# Patient Record
Sex: Female | Born: 1940 | Race: Black or African American | Hispanic: No | Smoking: Former smoker
Health system: Southern US, Community
[De-identification: ages and names within clinical notes are randomized; demographics above are authoritative.]

## PROBLEM LIST (undated history)

## (undated) DIAGNOSIS — K219 Gastro-esophageal reflux disease without esophagitis: Secondary | ICD-10-CM

## (undated) DIAGNOSIS — M1990 Unspecified osteoarthritis, unspecified site: Secondary | ICD-10-CM

## (undated) DIAGNOSIS — I639 Cerebral infarction, unspecified: Secondary | ICD-10-CM

## (undated) DIAGNOSIS — G4700 Insomnia, unspecified: Secondary | ICD-10-CM

## (undated) DIAGNOSIS — D126 Benign neoplasm of colon, unspecified: Secondary | ICD-10-CM

## (undated) DIAGNOSIS — R42 Dizziness and giddiness: Secondary | ICD-10-CM

## (undated) DIAGNOSIS — C189 Malignant neoplasm of colon, unspecified: Secondary | ICD-10-CM

## (undated) DIAGNOSIS — K3184 Gastroparesis: Secondary | ICD-10-CM

## (undated) DIAGNOSIS — R7303 Prediabetes: Secondary | ICD-10-CM

## (undated) DIAGNOSIS — E785 Hyperlipidemia, unspecified: Secondary | ICD-10-CM

## (undated) DIAGNOSIS — I10 Essential (primary) hypertension: Secondary | ICD-10-CM

## (undated) DIAGNOSIS — I2510 Atherosclerotic heart disease of native coronary artery without angina pectoris: Secondary | ICD-10-CM

## (undated) DIAGNOSIS — N393 Stress incontinence (female) (male): Secondary | ICD-10-CM

## (undated) HISTORY — DX: Prediabetes: R73.03

## (undated) HISTORY — DX: Benign neoplasm of colon, unspecified: D12.6

## (undated) HISTORY — PX: ABDOMINAL HYSTERECTOMY: SHX81

## (undated) HISTORY — PX: HEEL SPUR SURGERY: SHX665

## (undated) HISTORY — DX: Gastroparesis: K31.84

## (undated) HISTORY — DX: Cerebral infarction, unspecified: I63.9

## (undated) HISTORY — DX: Malignant neoplasm of colon, unspecified: C18.9

## (undated) HISTORY — DX: Hyperlipidemia, unspecified: E78.5

## (undated) HISTORY — DX: Atherosclerotic heart disease of native coronary artery without angina pectoris: I25.10

## (undated) HISTORY — DX: Unspecified osteoarthritis, unspecified site: M19.90

## (undated) HISTORY — DX: Insomnia, unspecified: G47.00

## (undated) HISTORY — DX: Dizziness and giddiness: R42

---
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
## 1993-04-25 HISTORY — PX: PARTIAL COLECTOMY: SHX5273

## 2000-08-07 ENCOUNTER — Encounter (HOSPITAL_COMMUNITY): Admission: RE | Admit: 2000-08-07 | Discharge: 2000-09-06 | Payer: Self-pay | Admitting: Oncology

## 2000-08-07 ENCOUNTER — Encounter: Admission: RE | Admit: 2000-08-07 | Discharge: 2000-08-07 | Payer: Self-pay | Admitting: Oncology

## 2000-10-16 ENCOUNTER — Encounter: Admission: RE | Admit: 2000-10-16 | Discharge: 2000-10-16 | Payer: Self-pay | Admitting: Oncology

## 2000-10-16 ENCOUNTER — Encounter (HOSPITAL_COMMUNITY): Admission: RE | Admit: 2000-10-16 | Discharge: 2000-11-15 | Payer: Self-pay | Admitting: Oncology

## 2000-10-27 ENCOUNTER — Encounter: Payer: Self-pay | Admitting: Podiatry

## 2000-10-27 ENCOUNTER — Ambulatory Visit (HOSPITAL_COMMUNITY): Admission: RE | Admit: 2000-10-27 | Discharge: 2000-10-27 | Payer: Self-pay | Admitting: *Deleted

## 2001-01-08 ENCOUNTER — Encounter: Payer: Self-pay | Admitting: Family Medicine

## 2001-01-08 ENCOUNTER — Ambulatory Visit (HOSPITAL_COMMUNITY): Admission: RE | Admit: 2001-01-08 | Discharge: 2001-01-08 | Payer: Self-pay | Admitting: Family Medicine

## 2001-02-28 ENCOUNTER — Encounter: Admission: RE | Admit: 2001-02-28 | Discharge: 2001-02-28 | Payer: Self-pay | Admitting: Oncology

## 2001-02-28 ENCOUNTER — Encounter (HOSPITAL_COMMUNITY): Admission: RE | Admit: 2001-02-28 | Discharge: 2001-03-30 | Payer: Self-pay | Admitting: Oncology

## 2001-04-16 ENCOUNTER — Emergency Department (HOSPITAL_COMMUNITY): Admission: EM | Admit: 2001-04-16 | Discharge: 2001-04-16 | Payer: Self-pay | Admitting: *Deleted

## 2001-04-16 ENCOUNTER — Encounter: Payer: Self-pay | Admitting: *Deleted

## 2001-04-25 HISTORY — PX: MENISCUS REPAIR: SHX5179

## 2001-10-16 ENCOUNTER — Ambulatory Visit (HOSPITAL_COMMUNITY): Admission: RE | Admit: 2001-10-16 | Discharge: 2001-10-16 | Payer: Self-pay | Admitting: Orthopaedic Surgery

## 2001-10-16 ENCOUNTER — Encounter: Payer: Self-pay | Admitting: Orthopaedic Surgery

## 2001-10-23 ENCOUNTER — Ambulatory Visit (HOSPITAL_COMMUNITY): Admission: RE | Admit: 2001-10-23 | Discharge: 2001-10-23 | Payer: Self-pay | Admitting: Orthopaedic Surgery

## 2001-12-12 ENCOUNTER — Ambulatory Visit (HOSPITAL_COMMUNITY): Admission: RE | Admit: 2001-12-12 | Discharge: 2001-12-12 | Payer: Self-pay | Admitting: General Surgery

## 2002-01-11 ENCOUNTER — Ambulatory Visit (HOSPITAL_COMMUNITY): Admission: RE | Admit: 2002-01-11 | Discharge: 2002-01-11 | Payer: Self-pay | Admitting: Family Medicine

## 2002-01-11 ENCOUNTER — Encounter: Payer: Self-pay | Admitting: Family Medicine

## 2002-04-25 HISTORY — PX: CERVICAL SPINE SURGERY: SHX589

## 2002-09-27 ENCOUNTER — Encounter: Payer: Self-pay | Admitting: Emergency Medicine

## 2002-09-27 ENCOUNTER — Emergency Department (HOSPITAL_COMMUNITY): Admission: EM | Admit: 2002-09-27 | Discharge: 2002-09-27 | Payer: Self-pay | Admitting: Emergency Medicine

## 2003-02-17 ENCOUNTER — Encounter: Payer: Self-pay | Admitting: Family Medicine

## 2003-02-17 ENCOUNTER — Ambulatory Visit (HOSPITAL_COMMUNITY): Admission: RE | Admit: 2003-02-17 | Discharge: 2003-02-17 | Payer: Self-pay | Admitting: Family Medicine

## 2003-10-07 ENCOUNTER — Ambulatory Visit (HOSPITAL_COMMUNITY): Admission: RE | Admit: 2003-10-07 | Discharge: 2003-10-07 | Payer: Self-pay | Admitting: *Deleted

## 2003-10-20 ENCOUNTER — Ambulatory Visit (HOSPITAL_COMMUNITY): Admission: RE | Admit: 2003-10-20 | Discharge: 2003-10-21 | Payer: Self-pay | Admitting: *Deleted

## 2003-10-20 ENCOUNTER — Encounter (INDEPENDENT_AMBULATORY_CARE_PROVIDER_SITE_OTHER): Payer: Self-pay | Admitting: Internal Medicine

## 2004-02-19 ENCOUNTER — Inpatient Hospital Stay (HOSPITAL_COMMUNITY): Admission: AD | Admit: 2004-02-19 | Discharge: 2004-02-22 | Payer: Self-pay | Admitting: Family Medicine

## 2004-03-01 ENCOUNTER — Ambulatory Visit (HOSPITAL_COMMUNITY): Admission: RE | Admit: 2004-03-01 | Discharge: 2004-03-01 | Payer: Self-pay | Admitting: General Surgery

## 2004-04-13 ENCOUNTER — Ambulatory Visit (HOSPITAL_COMMUNITY): Admission: RE | Admit: 2004-04-13 | Discharge: 2004-04-13 | Payer: Self-pay | Admitting: General Surgery

## 2004-04-15 ENCOUNTER — Ambulatory Visit (HOSPITAL_COMMUNITY): Admission: RE | Admit: 2004-04-15 | Discharge: 2004-04-15 | Payer: Self-pay | Admitting: General Surgery

## 2005-09-29 ENCOUNTER — Ambulatory Visit (HOSPITAL_COMMUNITY): Admission: RE | Admit: 2005-09-29 | Discharge: 2005-09-29 | Payer: Self-pay | Admitting: Family Medicine

## 2005-12-19 ENCOUNTER — Encounter (INDEPENDENT_AMBULATORY_CARE_PROVIDER_SITE_OTHER): Payer: Self-pay | Admitting: Internal Medicine

## 2006-03-01 ENCOUNTER — Encounter (INDEPENDENT_AMBULATORY_CARE_PROVIDER_SITE_OTHER): Payer: Self-pay | Admitting: Internal Medicine

## 2006-03-03 ENCOUNTER — Ambulatory Visit: Payer: Self-pay | Admitting: Internal Medicine

## 2006-03-14 ENCOUNTER — Emergency Department (HOSPITAL_COMMUNITY): Admission: EM | Admit: 2006-03-14 | Discharge: 2006-03-14 | Payer: Self-pay | Admitting: Emergency Medicine

## 2006-03-17 ENCOUNTER — Emergency Department (HOSPITAL_COMMUNITY): Admission: EM | Admit: 2006-03-17 | Discharge: 2006-03-17 | Payer: Self-pay | Admitting: Emergency Medicine

## 2006-03-17 ENCOUNTER — Encounter (INDEPENDENT_AMBULATORY_CARE_PROVIDER_SITE_OTHER): Payer: Self-pay | Admitting: Internal Medicine

## 2006-03-23 ENCOUNTER — Ambulatory Visit: Payer: Self-pay | Admitting: Internal Medicine

## 2006-03-31 ENCOUNTER — Ambulatory Visit: Payer: Self-pay | Admitting: Internal Medicine

## 2006-03-31 LAB — CONVERTED CEMR LAB: HDL: 62 mg/dL

## 2006-04-12 ENCOUNTER — Ambulatory Visit: Payer: Self-pay | Admitting: Internal Medicine

## 2006-04-13 ENCOUNTER — Ambulatory Visit (HOSPITAL_COMMUNITY): Admission: RE | Admit: 2006-04-13 | Discharge: 2006-04-14 | Payer: Self-pay | Admitting: Neurosurgery

## 2006-04-28 ENCOUNTER — Ambulatory Visit: Payer: Self-pay | Admitting: Internal Medicine

## 2006-05-19 ENCOUNTER — Ambulatory Visit: Payer: Self-pay | Admitting: Internal Medicine

## 2006-05-20 ENCOUNTER — Encounter (INDEPENDENT_AMBULATORY_CARE_PROVIDER_SITE_OTHER): Payer: Self-pay | Admitting: Internal Medicine

## 2006-05-20 LAB — CONVERTED CEMR LAB
AST: 15 units/L (ref 0–37)
Albumin: 4 g/dL (ref 3.5–5.2)
BUN: 10 mg/dL (ref 6–23)
Calcium: 9.3 mg/dL (ref 8.4–10.5)
Chloride: 103 meq/L (ref 96–112)
Potassium: 4 meq/L (ref 3.5–5.3)
Total Protein: 8.3 g/dL (ref 6.0–8.3)

## 2006-05-22 ENCOUNTER — Encounter (INDEPENDENT_AMBULATORY_CARE_PROVIDER_SITE_OTHER): Payer: Self-pay | Admitting: Internal Medicine

## 2006-07-18 ENCOUNTER — Encounter (INDEPENDENT_AMBULATORY_CARE_PROVIDER_SITE_OTHER): Payer: Self-pay | Admitting: Internal Medicine

## 2006-07-19 ENCOUNTER — Ambulatory Visit: Payer: Self-pay | Admitting: Internal Medicine

## 2006-08-15 ENCOUNTER — Encounter: Payer: Self-pay | Admitting: Internal Medicine

## 2006-08-16 ENCOUNTER — Ambulatory Visit: Payer: Self-pay | Admitting: Internal Medicine

## 2006-08-17 ENCOUNTER — Encounter (INDEPENDENT_AMBULATORY_CARE_PROVIDER_SITE_OTHER): Payer: Self-pay | Admitting: Internal Medicine

## 2006-10-20 ENCOUNTER — Ambulatory Visit: Payer: Self-pay | Admitting: Internal Medicine

## 2006-10-23 ENCOUNTER — Telehealth (INDEPENDENT_AMBULATORY_CARE_PROVIDER_SITE_OTHER): Payer: Self-pay | Admitting: *Deleted

## 2006-10-23 LAB — CONVERTED CEMR LAB
ALT: 8 U/L
AST: 12 U/L
Albumin: 4 g/dL
Alkaline Phosphatase: 42 U/L
BUN: 15 mg/dL
CO2: 25 meq/L
Calcium: 9.2 mg/dL
Chloride: 104 meq/L
Cholesterol: 171 mg/dL
Creatinine, Ser: 0.99 mg/dL
Glucose, Bld: 109 mg/dL — ABNORMAL HIGH
HDL: 38 mg/dL — ABNORMAL LOW
LDL Cholesterol: 112 mg/dL — ABNORMAL HIGH
Potassium: 3.4 meq/L — ABNORMAL LOW
Sodium: 142 meq/L
Total Bilirubin: 0.5 mg/dL
Total CHOL/HDL Ratio: 4.5
Total Protein: 8.2 g/dL
Triglycerides: 104 mg/dL
VLDL: 21 mg/dL

## 2006-10-26 ENCOUNTER — Telehealth (INDEPENDENT_AMBULATORY_CARE_PROVIDER_SITE_OTHER): Payer: Self-pay | Admitting: Internal Medicine

## 2006-11-02 ENCOUNTER — Ambulatory Visit (HOSPITAL_COMMUNITY): Admission: RE | Admit: 2006-11-02 | Discharge: 2006-11-02 | Payer: Self-pay | Admitting: Internal Medicine

## 2006-11-03 ENCOUNTER — Telehealth (INDEPENDENT_AMBULATORY_CARE_PROVIDER_SITE_OTHER): Payer: Self-pay | Admitting: *Deleted

## 2006-11-15 ENCOUNTER — Telehealth (INDEPENDENT_AMBULATORY_CARE_PROVIDER_SITE_OTHER): Payer: Self-pay | Admitting: *Deleted

## 2006-11-16 ENCOUNTER — Telehealth (INDEPENDENT_AMBULATORY_CARE_PROVIDER_SITE_OTHER): Payer: Self-pay | Admitting: *Deleted

## 2007-01-10 ENCOUNTER — Ambulatory Visit: Payer: Self-pay | Admitting: Internal Medicine

## 2007-01-23 ENCOUNTER — Ambulatory Visit: Payer: Self-pay | Admitting: Internal Medicine

## 2007-02-26 ENCOUNTER — Ambulatory Visit: Payer: Self-pay | Admitting: Internal Medicine

## 2007-02-26 LAB — CONVERTED CEMR LAB
Anti Nuclear Antibody(ANA): NEGATIVE
Blood in Urine, dipstick: NEGATIVE
CO2: 25 meq/L (ref 19–32)
Glucose, Bld: 109 mg/dL — ABNORMAL HIGH (ref 70–99)
Ketones, urine, test strip: NEGATIVE
Nitrite: NEGATIVE
Potassium: 4.1 meq/L (ref 3.5–5.3)
Sed Rate: 57 mm/hr — ABNORMAL HIGH (ref 0–22)
Sodium: 137 meq/L (ref 135–145)
TSH: 1.698 microintl units/mL (ref 0.350–5.50)
Urobilinogen, UA: 0.2

## 2007-04-26 DIAGNOSIS — K3184 Gastroparesis: Secondary | ICD-10-CM

## 2007-04-26 HISTORY — DX: Gastroparesis: K31.84

## 2007-05-29 ENCOUNTER — Ambulatory Visit: Payer: Self-pay | Admitting: Internal Medicine

## 2007-06-08 ENCOUNTER — Ambulatory Visit: Payer: Self-pay | Admitting: Internal Medicine

## 2007-06-08 ENCOUNTER — Telehealth (INDEPENDENT_AMBULATORY_CARE_PROVIDER_SITE_OTHER): Payer: Self-pay | Admitting: *Deleted

## 2007-06-27 ENCOUNTER — Ambulatory Visit: Payer: Self-pay | Admitting: Internal Medicine

## 2007-06-28 ENCOUNTER — Ambulatory Visit (HOSPITAL_COMMUNITY): Admission: RE | Admit: 2007-06-28 | Discharge: 2007-06-28 | Payer: Self-pay | Admitting: Internal Medicine

## 2007-07-02 ENCOUNTER — Ambulatory Visit: Payer: Self-pay | Admitting: Internal Medicine

## 2007-07-02 ENCOUNTER — Ambulatory Visit (HOSPITAL_COMMUNITY): Admission: RE | Admit: 2007-07-02 | Discharge: 2007-07-02 | Payer: Self-pay | Admitting: Internal Medicine

## 2007-07-02 HISTORY — PX: COLONOSCOPY: SHX174

## 2007-07-20 ENCOUNTER — Encounter (INDEPENDENT_AMBULATORY_CARE_PROVIDER_SITE_OTHER): Payer: Self-pay | Admitting: Internal Medicine

## 2007-08-01 ENCOUNTER — Ambulatory Visit: Payer: Self-pay | Admitting: Gastroenterology

## 2007-08-01 ENCOUNTER — Encounter: Payer: Self-pay | Admitting: Gastroenterology

## 2007-08-03 ENCOUNTER — Ambulatory Visit: Payer: Self-pay | Admitting: Internal Medicine

## 2007-08-22 ENCOUNTER — Ambulatory Visit (HOSPITAL_COMMUNITY): Admission: RE | Admit: 2007-08-22 | Discharge: 2007-08-22 | Payer: Self-pay | Admitting: Internal Medicine

## 2007-08-22 ENCOUNTER — Ambulatory Visit: Payer: Self-pay | Admitting: Internal Medicine

## 2007-08-22 HISTORY — PX: ESOPHAGOGASTRODUODENOSCOPY: SHX1529

## 2007-08-27 ENCOUNTER — Ambulatory Visit: Payer: Self-pay | Admitting: Internal Medicine

## 2007-08-28 ENCOUNTER — Telehealth (INDEPENDENT_AMBULATORY_CARE_PROVIDER_SITE_OTHER): Payer: Self-pay | Admitting: *Deleted

## 2007-08-28 LAB — CONVERTED CEMR LAB
ALT: 13 units/L (ref 0–35)
Albumin: 3.8 g/dL (ref 3.5–5.2)
Alkaline Phosphatase: 48 units/L (ref 39–117)
CO2: 25 meq/L (ref 19–32)
Glucose, Bld: 119 mg/dL — ABNORMAL HIGH (ref 70–99)
LDL Cholesterol: 66 mg/dL (ref 0–99)
Potassium: 3.8 meq/L (ref 3.5–5.3)
Sodium: 142 meq/L (ref 135–145)
Total Protein: 7.8 g/dL (ref 6.0–8.3)
Triglycerides: 70 mg/dL (ref <150)
VLDL: 14 mg/dL (ref 0–40)

## 2007-09-04 ENCOUNTER — Telehealth (INDEPENDENT_AMBULATORY_CARE_PROVIDER_SITE_OTHER): Payer: Self-pay

## 2007-09-13 ENCOUNTER — Encounter: Payer: Self-pay | Admitting: Gastroenterology

## 2007-09-13 ENCOUNTER — Ambulatory Visit (HOSPITAL_COMMUNITY): Admission: RE | Admit: 2007-09-13 | Discharge: 2007-09-13 | Payer: Self-pay | Admitting: Gastroenterology

## 2007-09-18 ENCOUNTER — Telehealth (INDEPENDENT_AMBULATORY_CARE_PROVIDER_SITE_OTHER): Payer: Self-pay | Admitting: *Deleted

## 2007-09-18 ENCOUNTER — Ambulatory Visit: Payer: Self-pay | Admitting: Gastroenterology

## 2007-09-19 ENCOUNTER — Encounter (INDEPENDENT_AMBULATORY_CARE_PROVIDER_SITE_OTHER): Payer: Self-pay | Admitting: Internal Medicine

## 2007-09-19 ENCOUNTER — Ambulatory Visit: Payer: Self-pay | Admitting: Internal Medicine

## 2007-09-24 ENCOUNTER — Encounter (HOSPITAL_COMMUNITY): Admission: RE | Admit: 2007-09-24 | Discharge: 2007-10-24 | Payer: Self-pay | Admitting: Internal Medicine

## 2007-10-10 ENCOUNTER — Ambulatory Visit: Payer: Self-pay | Admitting: Internal Medicine

## 2007-10-18 ENCOUNTER — Ambulatory Visit: Payer: Self-pay | Admitting: Internal Medicine

## 2007-11-14 ENCOUNTER — Encounter (INDEPENDENT_AMBULATORY_CARE_PROVIDER_SITE_OTHER): Payer: Self-pay | Admitting: Internal Medicine

## 2007-11-14 ENCOUNTER — Ambulatory Visit: Payer: Self-pay | Admitting: Internal Medicine

## 2007-11-21 ENCOUNTER — Ambulatory Visit: Payer: Self-pay | Admitting: Internal Medicine

## 2007-11-29 ENCOUNTER — Ambulatory Visit (HOSPITAL_COMMUNITY): Admission: RE | Admit: 2007-11-29 | Discharge: 2007-11-29 | Payer: Self-pay | Admitting: Internal Medicine

## 2008-01-03 ENCOUNTER — Ambulatory Visit: Payer: Self-pay | Admitting: Internal Medicine

## 2008-01-15 ENCOUNTER — Encounter (INDEPENDENT_AMBULATORY_CARE_PROVIDER_SITE_OTHER): Payer: Self-pay | Admitting: Internal Medicine

## 2008-01-22 ENCOUNTER — Ambulatory Visit: Payer: Self-pay | Admitting: Family Medicine

## 2008-03-03 ENCOUNTER — Ambulatory Visit: Payer: Self-pay | Admitting: Internal Medicine

## 2008-03-04 LAB — CONVERTED CEMR LAB
ALT: 10 units/L (ref 0–35)
BUN: 11 mg/dL (ref 6–23)
Basophils Absolute: 0 10*3/uL (ref 0.0–0.1)
CO2: 24 meq/L (ref 19–32)
Calcium: 9 mg/dL (ref 8.4–10.5)
Chloride: 104 meq/L (ref 96–112)
Cholesterol: 130 mg/dL (ref 0–200)
Creatinine, Ser: 0.95 mg/dL (ref 0.40–1.20)
Eosinophils Relative: 5 % (ref 0–5)
Glucose, Bld: 113 mg/dL — ABNORMAL HIGH (ref 70–99)
HCT: 28.6 % — ABNORMAL LOW (ref 36.0–46.0)
HDL: 44 mg/dL (ref >39)
Hemoglobin: 8.8 g/dL — ABNORMAL LOW (ref 12.0–15.0)
Lymphocytes Relative: 43 % (ref 12–46)
Lymphs Abs: 2.4 10*3/uL (ref 0.7–4.0)
Monocytes Absolute: 0.5 10*3/uL (ref 0.1–1.0)
Monocytes Relative: 9 % (ref 3–12)
Neutro Abs: 2.4 10*3/uL (ref 1.7–7.7)
RBC: 3.09 M/uL — ABNORMAL LOW (ref 3.87–5.11)
RDW: 15.3 % (ref 11.5–15.5)
Total Bilirubin: 0.5 mg/dL (ref 0.3–1.2)
Total CHOL/HDL Ratio: 3
Triglycerides: 64 mg/dL (ref <150)
VLDL: 13 mg/dL (ref 0–40)

## 2008-03-07 ENCOUNTER — Encounter (INDEPENDENT_AMBULATORY_CARE_PROVIDER_SITE_OTHER): Payer: Self-pay | Admitting: Internal Medicine

## 2008-03-10 ENCOUNTER — Encounter (INDEPENDENT_AMBULATORY_CARE_PROVIDER_SITE_OTHER): Payer: Self-pay | Admitting: Internal Medicine

## 2008-03-11 ENCOUNTER — Encounter (INDEPENDENT_AMBULATORY_CARE_PROVIDER_SITE_OTHER): Payer: Self-pay | Admitting: Internal Medicine

## 2008-03-13 LAB — CONVERTED CEMR LAB
Free Kappa Lt Chains,Ur: 1.95 mg/dL — ABNORMAL HIGH (ref 0.04–1.51)
Free Lambda Lt Chains,Ur: 0.57 mg/dL (ref 0.08–1.01)
Volume, Urine: 1000 mL

## 2008-03-18 ENCOUNTER — Encounter (INDEPENDENT_AMBULATORY_CARE_PROVIDER_SITE_OTHER): Payer: Self-pay | Admitting: Internal Medicine

## 2008-03-18 ENCOUNTER — Encounter (HOSPITAL_COMMUNITY): Admission: RE | Admit: 2008-03-18 | Discharge: 2008-04-17 | Payer: Self-pay | Admitting: Oncology

## 2008-03-18 ENCOUNTER — Ambulatory Visit (HOSPITAL_COMMUNITY): Payer: Self-pay | Admitting: Oncology

## 2008-03-25 ENCOUNTER — Telehealth (INDEPENDENT_AMBULATORY_CARE_PROVIDER_SITE_OTHER): Payer: Self-pay | Admitting: *Deleted

## 2008-03-28 ENCOUNTER — Ambulatory Visit: Payer: Self-pay | Admitting: Internal Medicine

## 2008-03-28 DIAGNOSIS — C9000 Multiple myeloma not having achieved remission: Secondary | ICD-10-CM | POA: Insufficient documentation

## 2008-03-31 LAB — CONVERTED CEMR LAB
BUN: 14 mg/dL (ref 6–23)
Calcium: 9.2 mg/dL (ref 8.4–10.5)
Glucose, Bld: 91 mg/dL (ref 70–99)
Magnesium: 2 mg/dL (ref 1.5–2.5)

## 2008-04-11 ENCOUNTER — Ambulatory Visit: Payer: Self-pay | Admitting: Internal Medicine

## 2008-04-16 ENCOUNTER — Encounter (INDEPENDENT_AMBULATORY_CARE_PROVIDER_SITE_OTHER): Payer: Self-pay | Admitting: Internal Medicine

## 2008-04-21 ENCOUNTER — Telehealth (INDEPENDENT_AMBULATORY_CARE_PROVIDER_SITE_OTHER): Payer: Self-pay | Admitting: *Deleted

## 2008-04-23 ENCOUNTER — Encounter (HOSPITAL_COMMUNITY): Admission: RE | Admit: 2008-04-23 | Discharge: 2008-05-23 | Payer: Self-pay | Admitting: Oncology

## 2008-05-07 ENCOUNTER — Ambulatory Visit (HOSPITAL_COMMUNITY): Payer: Self-pay | Admitting: Oncology

## 2008-05-19 ENCOUNTER — Telehealth (INDEPENDENT_AMBULATORY_CARE_PROVIDER_SITE_OTHER): Payer: Self-pay | Admitting: *Deleted

## 2008-05-28 ENCOUNTER — Ambulatory Visit: Payer: Self-pay | Admitting: Internal Medicine

## 2008-05-28 ENCOUNTER — Ambulatory Visit (HOSPITAL_COMMUNITY): Admission: RE | Admit: 2008-05-28 | Discharge: 2008-05-28 | Payer: Self-pay | Admitting: Internal Medicine

## 2008-06-03 ENCOUNTER — Encounter (INDEPENDENT_AMBULATORY_CARE_PROVIDER_SITE_OTHER): Payer: Self-pay | Admitting: Internal Medicine

## 2008-06-03 ENCOUNTER — Encounter (HOSPITAL_COMMUNITY): Admission: RE | Admit: 2008-06-03 | Discharge: 2008-07-03 | Payer: Self-pay | Admitting: Oncology

## 2008-06-25 ENCOUNTER — Ambulatory Visit (HOSPITAL_COMMUNITY): Payer: Self-pay | Admitting: Oncology

## 2008-06-26 ENCOUNTER — Ambulatory Visit: Payer: Self-pay | Admitting: Internal Medicine

## 2008-06-26 DIAGNOSIS — R600 Localized edema: Secondary | ICD-10-CM | POA: Insufficient documentation

## 2008-06-27 ENCOUNTER — Encounter (INDEPENDENT_AMBULATORY_CARE_PROVIDER_SITE_OTHER): Payer: Self-pay | Admitting: Internal Medicine

## 2008-06-27 LAB — CONVERTED CEMR LAB
Albumin: 3.9 g/dL (ref 3.5–5.2)
Total Bilirubin: 0.3 mg/dL (ref 0.3–1.2)
Total Protein: 8.5 g/dL — ABNORMAL HIGH (ref 6.0–8.3)

## 2008-06-28 ENCOUNTER — Encounter (INDEPENDENT_AMBULATORY_CARE_PROVIDER_SITE_OTHER): Payer: Self-pay | Admitting: Internal Medicine

## 2008-07-02 ENCOUNTER — Encounter (HOSPITAL_COMMUNITY): Admission: RE | Admit: 2008-07-02 | Discharge: 2008-08-01 | Payer: Self-pay | Admitting: Oncology

## 2008-07-17 ENCOUNTER — Telehealth (INDEPENDENT_AMBULATORY_CARE_PROVIDER_SITE_OTHER): Payer: Self-pay | Admitting: Internal Medicine

## 2008-07-21 ENCOUNTER — Telehealth (INDEPENDENT_AMBULATORY_CARE_PROVIDER_SITE_OTHER): Payer: Self-pay | Admitting: Internal Medicine

## 2008-07-24 ENCOUNTER — Telehealth (INDEPENDENT_AMBULATORY_CARE_PROVIDER_SITE_OTHER): Payer: Self-pay | Admitting: Internal Medicine

## 2008-08-01 ENCOUNTER — Encounter (INDEPENDENT_AMBULATORY_CARE_PROVIDER_SITE_OTHER): Payer: Self-pay | Admitting: Internal Medicine

## 2008-08-06 ENCOUNTER — Encounter (HOSPITAL_COMMUNITY): Admission: RE | Admit: 2008-08-06 | Discharge: 2008-09-05 | Payer: Self-pay | Admitting: Oncology

## 2008-08-20 ENCOUNTER — Ambulatory Visit: Payer: Self-pay | Admitting: Internal Medicine

## 2008-08-21 ENCOUNTER — Ambulatory Visit (HOSPITAL_COMMUNITY): Payer: Self-pay | Admitting: Oncology

## 2008-09-15 ENCOUNTER — Encounter (INDEPENDENT_AMBULATORY_CARE_PROVIDER_SITE_OTHER): Payer: Self-pay | Admitting: Internal Medicine

## 2008-09-18 ENCOUNTER — Encounter (HOSPITAL_COMMUNITY): Admission: RE | Admit: 2008-09-18 | Discharge: 2008-10-18 | Payer: Self-pay | Admitting: Oncology

## 2008-10-15 ENCOUNTER — Ambulatory Visit (HOSPITAL_COMMUNITY): Payer: Self-pay | Admitting: Oncology

## 2008-10-20 ENCOUNTER — Ambulatory Visit: Payer: Self-pay | Admitting: Internal Medicine

## 2008-10-29 ENCOUNTER — Encounter (HOSPITAL_COMMUNITY): Admission: RE | Admit: 2008-10-29 | Discharge: 2008-11-28 | Payer: Self-pay | Admitting: Oncology

## 2008-12-03 ENCOUNTER — Ambulatory Visit (HOSPITAL_COMMUNITY): Admission: RE | Admit: 2008-12-03 | Discharge: 2008-12-03 | Payer: Self-pay | Admitting: Internal Medicine

## 2008-12-10 ENCOUNTER — Encounter (HOSPITAL_COMMUNITY): Admission: RE | Admit: 2008-12-10 | Discharge: 2009-01-09 | Payer: Self-pay | Admitting: Oncology

## 2008-12-10 ENCOUNTER — Ambulatory Visit (HOSPITAL_COMMUNITY): Payer: Self-pay | Admitting: Oncology

## 2008-12-24 ENCOUNTER — Encounter (INDEPENDENT_AMBULATORY_CARE_PROVIDER_SITE_OTHER): Payer: Self-pay | Admitting: Internal Medicine

## 2009-01-27 ENCOUNTER — Ambulatory Visit (HOSPITAL_COMMUNITY): Payer: Self-pay | Admitting: Oncology

## 2009-01-27 ENCOUNTER — Encounter (HOSPITAL_COMMUNITY): Admission: RE | Admit: 2009-01-27 | Discharge: 2009-02-26 | Payer: Self-pay | Admitting: Oncology

## 2009-03-06 ENCOUNTER — Encounter (HOSPITAL_COMMUNITY): Admission: RE | Admit: 2009-03-06 | Discharge: 2009-04-05 | Payer: Self-pay | Admitting: Oncology

## 2009-05-01 ENCOUNTER — Ambulatory Visit (HOSPITAL_COMMUNITY): Payer: Self-pay | Admitting: Oncology

## 2009-05-01 ENCOUNTER — Encounter (HOSPITAL_COMMUNITY): Admission: RE | Admit: 2009-05-01 | Discharge: 2009-05-31 | Payer: Self-pay | Admitting: Oncology

## 2009-06-08 ENCOUNTER — Encounter (HOSPITAL_COMMUNITY): Admission: RE | Admit: 2009-06-08 | Discharge: 2009-07-08 | Payer: Self-pay | Admitting: Oncology

## 2009-06-23 ENCOUNTER — Ambulatory Visit (HOSPITAL_COMMUNITY): Payer: Self-pay | Admitting: Oncology

## 2009-07-16 ENCOUNTER — Encounter (HOSPITAL_COMMUNITY): Admission: RE | Admit: 2009-07-16 | Discharge: 2009-08-15 | Payer: Self-pay | Admitting: Oncology

## 2009-07-27 ENCOUNTER — Emergency Department (HOSPITAL_COMMUNITY): Admission: EM | Admit: 2009-07-27 | Discharge: 2009-07-27 | Payer: Self-pay | Admitting: Emergency Medicine

## 2009-08-13 ENCOUNTER — Ambulatory Visit (HOSPITAL_COMMUNITY): Payer: Self-pay | Admitting: Oncology

## 2009-08-20 ENCOUNTER — Encounter (HOSPITAL_COMMUNITY): Admission: RE | Admit: 2009-08-20 | Discharge: 2009-09-19 | Payer: Self-pay | Admitting: Oncology

## 2009-09-23 ENCOUNTER — Encounter (HOSPITAL_COMMUNITY): Admission: RE | Admit: 2009-09-23 | Discharge: 2009-10-23 | Payer: Self-pay | Admitting: Oncology

## 2009-09-28 ENCOUNTER — Ambulatory Visit (HOSPITAL_COMMUNITY): Payer: Self-pay | Admitting: Oncology

## 2009-10-28 ENCOUNTER — Ambulatory Visit (HOSPITAL_COMMUNITY): Admission: RE | Admit: 2009-10-28 | Discharge: 2009-10-28 | Payer: Self-pay | Admitting: General Surgery

## 2009-11-03 ENCOUNTER — Encounter (HOSPITAL_COMMUNITY): Admission: RE | Admit: 2009-11-03 | Discharge: 2009-12-03 | Payer: Self-pay | Admitting: Oncology

## 2009-11-13 ENCOUNTER — Telehealth (INDEPENDENT_AMBULATORY_CARE_PROVIDER_SITE_OTHER): Payer: Self-pay

## 2009-11-13 ENCOUNTER — Ambulatory Visit (HOSPITAL_COMMUNITY): Payer: Self-pay | Admitting: Oncology

## 2009-12-07 ENCOUNTER — Encounter (HOSPITAL_COMMUNITY): Admission: RE | Admit: 2009-12-07 | Discharge: 2010-01-06 | Payer: Self-pay | Admitting: Oncology

## 2009-12-31 ENCOUNTER — Ambulatory Visit (HOSPITAL_COMMUNITY): Payer: Self-pay | Admitting: Oncology

## 2010-01-12 ENCOUNTER — Encounter (HOSPITAL_COMMUNITY)
Admission: RE | Admit: 2010-01-12 | Discharge: 2010-01-22 | Payer: Self-pay | Source: Home / Self Care | Admitting: Oncology

## 2010-01-13 ENCOUNTER — Ambulatory Visit (HOSPITAL_COMMUNITY): Payer: Self-pay | Admitting: Oncology

## 2010-01-26 ENCOUNTER — Encounter (HOSPITAL_COMMUNITY)
Admission: RE | Admit: 2010-01-26 | Discharge: 2010-02-25 | Payer: Self-pay | Source: Home / Self Care | Admitting: Oncology

## 2010-02-11 ENCOUNTER — Ambulatory Visit (HOSPITAL_COMMUNITY): Admission: RE | Admit: 2010-02-11 | Discharge: 2010-02-11 | Payer: Self-pay | Admitting: Family Medicine

## 2010-02-16 ENCOUNTER — Ambulatory Visit (HOSPITAL_COMMUNITY): Payer: Self-pay | Admitting: Oncology

## 2010-03-24 ENCOUNTER — Encounter (HOSPITAL_COMMUNITY)
Admission: RE | Admit: 2010-03-24 | Discharge: 2010-04-23 | Payer: Self-pay | Source: Home / Self Care | Attending: Oncology | Admitting: Oncology

## 2010-04-14 ENCOUNTER — Ambulatory Visit (HOSPITAL_COMMUNITY): Payer: Self-pay | Admitting: Oncology

## 2010-05-07 ENCOUNTER — Encounter (HOSPITAL_COMMUNITY)
Admission: RE | Admit: 2010-05-07 | Discharge: 2010-05-25 | Payer: Self-pay | Source: Home / Self Care | Attending: Oncology | Admitting: Oncology

## 2010-05-16 ENCOUNTER — Encounter: Payer: Self-pay | Admitting: Family Medicine

## 2010-05-17 LAB — PROTEIN ELECTROPHORESIS, SERUM
Albumin ELP: 54.4 % — ABNORMAL LOW (ref 55.8–66.1)
Alpha-1-Globulin: 5.1 % — ABNORMAL HIGH (ref 2.9–4.9)
Gamma Globulin: 16.5 % (ref 11.1–18.8)

## 2010-05-17 LAB — KAPPA/LAMBDA LIGHT CHAINS
Kappa free light chain: 1.07 mg/dL (ref 0.33–1.94)
Lambda free light chains: 3.29 mg/dL — ABNORMAL HIGH (ref 0.57–2.63)

## 2010-05-17 LAB — COMPREHENSIVE METABOLIC PANEL
Alkaline Phosphatase: 34 U/L — ABNORMAL LOW (ref 39–117)
BUN: 13 mg/dL (ref 6–23)
CO2: 25 mEq/L (ref 19–32)
Chloride: 104 mEq/L (ref 96–112)
Glucose, Bld: 81 mg/dL (ref 70–99)
Potassium: 5.4 mEq/L — ABNORMAL HIGH (ref 3.5–5.1)
Total Bilirubin: 0.5 mg/dL (ref 0.3–1.2)

## 2010-05-17 LAB — IMMUNOFIXATION ELECTROPHORESIS: IgA: 86 mg/dL (ref 68–378)

## 2010-05-17 LAB — CBC
HCT: 28.4 % — ABNORMAL LOW (ref 36.0–46.0)
Hemoglobin: 9.4 g/dL — ABNORMAL LOW (ref 12.0–15.0)
MCV: 92.5 fL (ref 78.0–100.0)

## 2010-05-17 LAB — DIFFERENTIAL
Basophils Absolute: 0 10*3/uL (ref 0.0–0.1)
Basophils Relative: 0 % (ref 0–1)
Eosinophils Absolute: 0.2 10*3/uL (ref 0.0–0.7)
Eosinophils Relative: 3 % (ref 0–5)
Monocytes Absolute: 0.7 10*3/uL (ref 0.1–1.0)
Monocytes Relative: 12 % (ref 3–12)
Neutro Abs: 3.7 10*3/uL (ref 1.7–7.7)

## 2010-05-23 LAB — CONVERTED CEMR LAB
ALT: 10 units/L (ref 0–35)
AST: 12 units/L (ref 0–37)
Albumin: 4 g/dL (ref 3.5–5.2)
Basophils Absolute: 0 10*3/uL (ref 0.0–0.1)
Basophils Relative: 0 % (ref 0–1)
Calcium: 9.2 mg/dL (ref 8.4–10.5)
Chloride: 102 meq/L (ref 96–112)
Eosinophils Absolute: 0.2 10*3/uL (ref 0.0–0.7)
MCHC: 30.2 g/dL (ref 30.0–36.0)
MCV: 96 fL (ref 78.0–100.0)
Monocytes Absolute: 0.6 10*3/uL (ref 0.1–1.0)
Monocytes Relative: 10 % (ref 3–12)
Neutro Abs: 3.1 10*3/uL (ref 1.7–7.7)
Neutrophils Relative %: 53 % (ref 43–77)
Potassium: 3.8 meq/L (ref 3.5–5.3)
RBC: 3.03 M/uL — ABNORMAL LOW (ref 3.87–5.11)
RDW: 15.6 % — ABNORMAL HIGH (ref 11.5–15.5)
Sodium: 137 meq/L (ref 135–145)
Total CHOL/HDL Ratio: 2.7
Total Protein: 8.3 g/dL (ref 6.0–8.3)

## 2010-05-24 LAB — BASIC METABOLIC PANEL
CO2: 26 mEq/L (ref 19–32)
Calcium: 9.1 mg/dL (ref 8.4–10.5)
Chloride: 106 mEq/L (ref 96–112)
GFR calc Af Amer: 44 mL/min — ABNORMAL LOW (ref >60)
Potassium: 3.8 mEq/L (ref 3.5–5.1)
Sodium: 138 mEq/L (ref 135–145)

## 2010-05-25 NOTE — Progress Notes (Signed)
Summary: severe abd pain  Phone Note From Other Clinic   Caller: Tammy at Dr. Arnell Asal office Summary of Call: recieved call from Tammy at Dr. Arnell Asal office, pt is being seen there today and is complaining to Dr. Mariel Sleet of severe abd pain afer eating.pt has gastroparesis and is on reglan.  pt last seen by RMR in 07/2008.  Dr. Mariel Sleet would like to speak to [facility]MR about this pt.  Initial call taken by: Hendricks Limes LPN,  November 13, 2009 11:27 AM     Appended Document: severe abd pain FYI, I called [facility]EM/ONC and repeatedly got machine; I left a message for a return call from Dr Lynn Ito Friday afternoon 7/22. with no subsequent response

## 2010-06-09 ENCOUNTER — Ambulatory Visit (HOSPITAL_COMMUNITY): Payer: Self-pay | Admitting: Oncology

## 2010-06-09 ENCOUNTER — Ambulatory Visit (HOSPITAL_COMMUNITY): Payer: Medicare HMO | Admitting: Oncology

## 2010-06-09 DIAGNOSIS — C9000 Multiple myeloma not having achieved remission: Secondary | ICD-10-CM

## 2010-06-18 ENCOUNTER — Other Ambulatory Visit (HOSPITAL_COMMUNITY): Payer: Self-pay | Admitting: Oncology

## 2010-06-18 ENCOUNTER — Ambulatory Visit (HOSPITAL_COMMUNITY): Payer: Medicare HMO

## 2010-06-18 ENCOUNTER — Encounter (HOSPITAL_COMMUNITY): Payer: Medicare HMO | Attending: Oncology

## 2010-06-18 DIAGNOSIS — E538 Deficiency of other specified B group vitamins: Secondary | ICD-10-CM

## 2010-06-18 DIAGNOSIS — Z79899 Other long term (current) drug therapy: Secondary | ICD-10-CM | POA: Insufficient documentation

## 2010-06-18 DIAGNOSIS — C9000 Multiple myeloma not having achieved remission: Secondary | ICD-10-CM

## 2010-06-18 DIAGNOSIS — Z5111 Encounter for antineoplastic chemotherapy: Secondary | ICD-10-CM

## 2010-06-21 LAB — KAPPA/LAMBDA LIGHT CHAINS
Kappa, lambda light chain ratio: 0.24 — ABNORMAL LOW (ref 0.26–1.65)
Lambda free light chains: 3.77 mg/dL — ABNORMAL HIGH (ref 0.57–2.63)

## 2010-06-22 LAB — MULTIPLE MYELOMA PANEL, SERUM
Beta 2: 4.7 % (ref 3.2–6.5)
Beta Globulin: 5.4 % (ref 4.7–7.2)
IgG (Immunoglobin G), Serum: 1400 mg/dL (ref 694–1618)
M-Spike, %: 0.72 g/dL
Total Protein: 7.3 g/dL (ref 6.0–8.3)

## 2010-06-24 ENCOUNTER — Emergency Department (HOSPITAL_COMMUNITY): Payer: Medicare HMO

## 2010-06-24 ENCOUNTER — Ambulatory Visit (HOSPITAL_COMMUNITY): Payer: Medicare HMO | Admitting: Oncology

## 2010-06-24 ENCOUNTER — Emergency Department (HOSPITAL_COMMUNITY)
Admission: EM | Admit: 2010-06-24 | Discharge: 2010-06-24 | Disposition: A | Discharge status: Home / Self Care | Payer: Medicare HMO | Attending: Emergency Medicine | Admitting: Emergency Medicine

## 2010-06-24 DIAGNOSIS — R4701 Aphasia: Secondary | ICD-10-CM | POA: Insufficient documentation

## 2010-06-24 DIAGNOSIS — E7800 Pure hypercholesterolemia, unspecified: Secondary | ICD-10-CM | POA: Insufficient documentation

## 2010-06-24 DIAGNOSIS — Z79899 Other long term (current) drug therapy: Secondary | ICD-10-CM | POA: Insufficient documentation

## 2010-06-24 DIAGNOSIS — C9000 Multiple myeloma not having achieved remission: Secondary | ICD-10-CM

## 2010-06-24 DIAGNOSIS — R4789 Other speech disturbances: Secondary | ICD-10-CM | POA: Insufficient documentation

## 2010-06-24 DIAGNOSIS — Z87898 Personal history of other specified conditions: Secondary | ICD-10-CM | POA: Insufficient documentation

## 2010-06-24 DIAGNOSIS — I10 Essential (primary) hypertension: Secondary | ICD-10-CM | POA: Insufficient documentation

## 2010-06-24 DIAGNOSIS — Z85038 Personal history of other malignant neoplasm of large intestine: Secondary | ICD-10-CM | POA: Insufficient documentation

## 2010-06-24 LAB — URINE MICROSCOPIC-ADD ON

## 2010-06-24 LAB — DIFFERENTIAL
Lymphs Abs: 1.3 10*3/uL (ref 0.7–4.0)
Monocytes Relative: 12 % (ref 3–12)
Neutro Abs: 6.1 10*3/uL (ref 1.7–7.7)
Neutrophils Relative %: 73 % (ref 43–77)

## 2010-06-24 LAB — COMPREHENSIVE METABOLIC PANEL
ALT: 15 U/L (ref 0–35)
AST: 15 U/L (ref 0–37)
Alkaline Phosphatase: 37 U/L — ABNORMAL LOW (ref 39–117)
CO2: 23 mEq/L (ref 19–32)
GFR calc Af Amer: 16 mL/min — ABNORMAL LOW (ref >60)
GFR calc non Af Amer: 13 mL/min — ABNORMAL LOW (ref >60)
Glucose, Bld: 122 mg/dL — ABNORMAL HIGH (ref 70–99)
Potassium: 4.2 mEq/L (ref 3.5–5.1)
Sodium: 138 mEq/L (ref 135–145)
Total Protein: 6.5 g/dL (ref 6.0–8.3)

## 2010-06-24 LAB — URINALYSIS, ROUTINE W REFLEX MICROSCOPIC
Bilirubin Urine: NEGATIVE
Ketones, ur: NEGATIVE mg/dL
Leukocytes, UA: NEGATIVE
Protein, ur: 100 mg/dL — AB
Urine Glucose, Fasting: NEGATIVE mg/dL

## 2010-06-24 LAB — PROTIME-INR
INR: 0.98 (ref 0.00–1.49)
Prothrombin Time: 13.2 seconds (ref 11.6–15.2)

## 2010-06-24 LAB — CBC
Hemoglobin: 9 g/dL — ABNORMAL LOW (ref 12.0–15.0)
MCH: 30.1 pg (ref 26.0–34.0)
MCV: 92 fL (ref 78.0–100.0)
RBC: 2.99 MIL/uL — ABNORMAL LOW (ref 3.87–5.11)

## 2010-06-30 ENCOUNTER — Other Ambulatory Visit (HOSPITAL_COMMUNITY): Payer: Self-pay | Admitting: Oncology

## 2010-06-30 DIAGNOSIS — R4781 Slurred speech: Secondary | ICD-10-CM

## 2010-07-01 ENCOUNTER — Inpatient Hospital Stay (HOSPITAL_COMMUNITY): Payer: Medicare HMO

## 2010-07-01 ENCOUNTER — Encounter (HOSPITAL_COMMUNITY): Payer: Medicare HMO | Attending: Oncology

## 2010-07-01 ENCOUNTER — Other Ambulatory Visit (HOSPITAL_COMMUNITY): Payer: Medicare HMO

## 2010-07-01 ENCOUNTER — Other Ambulatory Visit (HOSPITAL_COMMUNITY): Payer: Self-pay | Admitting: Oncology

## 2010-07-01 DIAGNOSIS — Z5111 Encounter for antineoplastic chemotherapy: Secondary | ICD-10-CM

## 2010-07-01 DIAGNOSIS — C9000 Multiple myeloma not having achieved remission: Secondary | ICD-10-CM | POA: Insufficient documentation

## 2010-07-01 DIAGNOSIS — Z79899 Other long term (current) drug therapy: Secondary | ICD-10-CM | POA: Insufficient documentation

## 2010-07-02 ENCOUNTER — Encounter (HOSPITAL_COMMUNITY): Payer: Medicare HMO

## 2010-07-02 DIAGNOSIS — D649 Anemia, unspecified: Secondary | ICD-10-CM

## 2010-07-02 DIAGNOSIS — C9000 Multiple myeloma not having achieved remission: Secondary | ICD-10-CM

## 2010-07-03 LAB — CROSSMATCH
ABO/RH(D): A POS
Unit division: 0

## 2010-07-05 LAB — DIFFERENTIAL
Basophils Absolute: 0 10*3/uL (ref 0.0–0.1)
Basophils Relative: 0 % (ref 0–1)
Lymphocytes Relative: 5 % — ABNORMAL LOW (ref 12–46)
Monocytes Absolute: 1 10*3/uL (ref 0.1–1.0)
Monocytes Relative: 6 % (ref 3–12)
Neutro Abs: 15.7 10*3/uL — ABNORMAL HIGH (ref 1.7–7.7)
Neutrophils Relative %: 89 % — ABNORMAL HIGH (ref 43–77)

## 2010-07-05 LAB — IMMUNOFIXATION ELECTROPHORESIS
IgA: 60 mg/dL — ABNORMAL LOW (ref 68–378)
IgM, Serum: 44 mg/dL — ABNORMAL LOW (ref 60–263)
Total Protein ELP: 6.9 g/dL (ref 6.0–8.3)

## 2010-07-05 LAB — CBC
HCT: 28.1 % — ABNORMAL LOW (ref 36.0–46.0)
MCH: 29.1 pg (ref 26.0–34.0)
MCV: 88.9 fL (ref 78.0–100.0)
Platelets: 226 10*3/uL (ref 150–400)
RDW: 17.7 % — ABNORMAL HIGH (ref 11.5–15.5)
WBC: 17.7 10*3/uL — ABNORMAL HIGH (ref 4.0–10.5)

## 2010-07-05 LAB — BASIC METABOLIC PANEL
BUN: 18 mg/dL (ref 6–23)
CO2: 26 mEq/L (ref 19–32)
Chloride: 105 mEq/L (ref 96–112)
Creatinine, Ser: 1.11 mg/dL (ref 0.4–1.2)
Glucose, Bld: 129 mg/dL — ABNORMAL HIGH (ref 70–99)
Potassium: 3.9 mEq/L (ref 3.5–5.1)

## 2010-07-05 LAB — COMPREHENSIVE METABOLIC PANEL
Albumin: 3.9 g/dL (ref 3.5–5.2)
Alkaline Phosphatase: 40 U/L (ref 39–117)
BUN: 28 mg/dL — ABNORMAL HIGH (ref 6–23)
Creatinine, Ser: 1.26 mg/dL — ABNORMAL HIGH (ref 0.4–1.2)
Glucose, Bld: 191 mg/dL — ABNORMAL HIGH (ref 70–99)
Potassium: 4.2 mEq/L (ref 3.5–5.1)
Total Bilirubin: 0.6 mg/dL (ref 0.3–1.2)
Total Protein: 6.6 g/dL (ref 6.0–8.3)

## 2010-07-05 LAB — PROTEIN ELECTROPHORESIS, SERUM
Albumin ELP: 57.6 % (ref 55.8–66.1)
M-Spike, %: 0.46 g/dL
Total Protein ELP: 6.9 g/dL (ref 6.0–8.3)

## 2010-07-06 LAB — CREATININE CLEARANCE, URINE, 24 HOUR
Collection Interval-CRCL: 24 hours
Creatinine Clearance: 43 mL/min — ABNORMAL LOW (ref 75–115)
Creatinine, 24H Ur: 944 mg/d (ref 700–1800)
Creatinine, Urine: 104.89 mg/dL

## 2010-07-06 LAB — COMPREHENSIVE METABOLIC PANEL
AST: 15 U/L (ref 0–37)
Albumin: 3.7 g/dL (ref 3.5–5.2)
CO2: 25 mEq/L (ref 19–32)
Calcium: 9.4 mg/dL (ref 8.4–10.5)
Creatinine, Ser: 1.52 mg/dL — ABNORMAL HIGH (ref 0.4–1.2)
GFR calc Af Amer: 41 mL/min — ABNORMAL LOW (ref >60)
GFR calc non Af Amer: 34 mL/min — ABNORMAL LOW (ref >60)
Sodium: 139 mEq/L (ref 135–145)
Total Protein: 6.5 g/dL (ref 6.0–8.3)

## 2010-07-06 LAB — CBC
MCH: 29.2 pg (ref 26.0–34.0)
MCHC: 32.4 g/dL (ref 30.0–36.0)
Platelets: 168 10*3/uL (ref 150–400)
RDW: 16.9 % — ABNORMAL HIGH (ref 11.5–15.5)

## 2010-07-06 LAB — PROTEIN ELECTROPH W RFLX QUANT IMMUNOGLOBULINS
Alpha-1-Globulin: 5.5 % — ABNORMAL HIGH (ref 2.9–4.9)
Gamma Globulin: 12.3 % (ref 11.1–18.8)
M-Spike, %: 0.41 g/dL

## 2010-07-06 LAB — UIFE/LIGHT CHAINS/TP QN, 24-HR UR
Alpha 1, Urine: DETECTED — AB
Alpha 2, Urine: DETECTED — AB
Free Kappa Lt Chains,Ur: 9.91 mg/dL — ABNORMAL HIGH (ref 0.04–1.51)
Gamma Globulin, Urine: NOT DETECTED

## 2010-07-06 LAB — IMMUNOFIXATION ELECTROPHORESIS
IgA: 54 mg/dL — ABNORMAL LOW (ref 68–378)
Total Protein ELP: 6.6 g/dL (ref 6.0–8.3)

## 2010-07-06 LAB — KAPPA/LAMBDA LIGHT CHAINS: Lambda free light chains: 1.39 mg/dL (ref 0.57–2.63)

## 2010-07-06 LAB — PROTEIN, URINE, 24 HOUR
Collection Interval-UPROT: 24 hours
Protein, Urine: 27 mg/dL

## 2010-07-06 LAB — IGG, IGA, IGM
IgA: 57 mg/dL — ABNORMAL LOW (ref 68–378)
IgG (Immunoglobin G), Serum: 1010 mg/dL (ref 694–1618)

## 2010-07-06 LAB — HEMOGLOBIN A1C: Hgb A1c MFr Bld: 7 % — ABNORMAL HIGH (ref <5.7)

## 2010-07-06 LAB — DIFFERENTIAL
Eosinophils Relative: 0 % (ref 0–5)
Lymphocytes Relative: 4 % — ABNORMAL LOW (ref 12–46)
Lymphs Abs: 0.6 10*3/uL — ABNORMAL LOW (ref 0.7–4.0)
Monocytes Relative: 5 % (ref 3–12)

## 2010-07-07 ENCOUNTER — Ambulatory Visit (HOSPITAL_COMMUNITY): Payer: Medicare HMO | Admitting: Oncology

## 2010-07-07 LAB — CBC
HCT: 38.4 % (ref 36.0–46.0)
Hemoglobin: 13.1 g/dL (ref 12.0–15.0)
MCHC: 32.4 g/dL (ref 30.0–36.0)
RBC: 4.38 MIL/uL (ref 3.87–5.11)
RDW: 18.1 % — ABNORMAL HIGH (ref 11.5–15.5)

## 2010-07-07 LAB — COMPREHENSIVE METABOLIC PANEL
AST: 16 U/L (ref 0–37)
Albumin: 3.6 g/dL (ref 3.5–5.2)
Alkaline Phosphatase: 28 U/L — ABNORMAL LOW (ref 39–117)
BUN: 21 mg/dL (ref 6–23)
GFR calc Af Amer: 43 mL/min — ABNORMAL LOW (ref >60)
Potassium: 3.5 mEq/L (ref 3.5–5.1)
Total Protein: 6.5 g/dL (ref 6.0–8.3)

## 2010-07-07 LAB — PROTEIN ELECTROPHORESIS, SERUM
Gamma Globulin: 11.3 % (ref 11.1–18.8)
M-Spike, %: 0.3 g/dL

## 2010-07-07 LAB — IMMUNOFIXATION ELECTROPHORESIS
IgA: 42 mg/dL — ABNORMAL LOW (ref 68–378)
IgM, Serum: 37 mg/dL — ABNORMAL LOW (ref 60–263)

## 2010-07-07 LAB — DIFFERENTIAL
Basophils Relative: 1 % (ref 0–1)
Basophils Relative: 1 % (ref 0–1)
Lymphocytes Relative: 14 % (ref 12–46)
Lymphocytes Relative: 14 % (ref 12–46)
Lymphs Abs: 1.1 10*3/uL (ref 0.7–4.0)
Monocytes Absolute: 0.8 10*3/uL (ref 0.1–1.0)
Monocytes Relative: 8 % (ref 3–12)
Monocytes Relative: 9 % (ref 3–12)
Neutro Abs: 5.9 10*3/uL (ref 1.7–7.7)
Neutro Abs: 7.7 10*3/uL (ref 1.7–7.7)
Neutrophils Relative %: 75 % (ref 43–77)

## 2010-07-08 LAB — COMPREHENSIVE METABOLIC PANEL
Alkaline Phosphatase: 21 U/L — ABNORMAL LOW (ref 39–117)
BUN: 22 mg/dL (ref 6–23)
Creatinine, Ser: 2.1 mg/dL — ABNORMAL HIGH (ref 0.4–1.2)
Glucose, Bld: 96 mg/dL (ref 70–99)
Potassium: 4 mEq/L (ref 3.5–5.1)
Total Bilirubin: 0.7 mg/dL (ref 0.3–1.2)
Total Protein: 6.3 g/dL (ref 6.0–8.3)

## 2010-07-08 LAB — DIFFERENTIAL
Basophils Absolute: 0 10*3/uL (ref 0.0–0.1)
Basophils Absolute: 0 10*3/uL (ref 0.0–0.1)
Basophils Absolute: 0 10*3/uL (ref 0.0–0.1)
Basophils Relative: 0 % (ref 0–1)
Basophils Relative: 1 % (ref 0–1)
Basophils Relative: 0 % (ref 0–1)
Eosinophils Absolute: 0.1 10*3/uL (ref 0.0–0.7)
Eosinophils Absolute: 0 10*3/uL (ref 0.0–0.7)
Eosinophils Relative: 1 % (ref 0–5)
Eosinophils Relative: 0 % (ref 0–5)
Lymphs Abs: 2 10*3/uL (ref 0.7–4.0)
Monocytes Absolute: 0.7 10*3/uL (ref 0.1–1.0)
Monocytes Absolute: 0.9 10*3/uL (ref 0.1–1.0)
Monocytes Relative: 7 % (ref 3–12)
Neutro Abs: 4.5 10*3/uL (ref 1.7–7.7)
Neutro Abs: 6.4 10*3/uL (ref 1.7–7.7)
Neutro Abs: 9.8 10*3/uL — ABNORMAL HIGH (ref 1.7–7.7)
Neutrophils Relative %: 69 % (ref 43–77)
Neutrophils Relative %: 75 % (ref 43–77)

## 2010-07-08 LAB — BASIC METABOLIC PANEL
Calcium: 9 mg/dL (ref 8.4–10.5)
GFR calc Af Amer: 43 mL/min — ABNORMAL LOW (ref >60)
GFR calc non Af Amer: 35 mL/min — ABNORMAL LOW (ref >60)
Potassium: 3.9 mEq/L (ref 3.5–5.1)
Sodium: 137 mEq/L (ref 135–145)

## 2010-07-08 LAB — PROTEIN ELECTROPHORESIS, SERUM
Alpha-1-Globulin: 6.3 % — ABNORMAL HIGH (ref 2.9–4.9)
Alpha-2-Globulin: 17.5 % — ABNORMAL HIGH (ref 7.1–11.8)
Total Protein ELP: 6 g/dL (ref 6.0–8.3)

## 2010-07-08 LAB — CROSSMATCH: ABO/RH(D): A POS

## 2010-07-08 LAB — CBC
HCT: 32.8 % — ABNORMAL LOW (ref 36.0–46.0)
HCT: 23.4 % — ABNORMAL LOW (ref 36.0–46.0)
Hemoglobin: 10.2 g/dL — ABNORMAL LOW (ref 12.0–15.0)
MCH: 29.1 pg (ref 26.0–34.0)
MCH: 30.7 pg (ref 26.0–34.0)
MCHC: 32.5 g/dL (ref 30.0–36.0)
MCHC: 33.3 g/dL (ref 30.0–36.0)
MCHC: 32.6 g/dL (ref 30.0–36.0)
MCV: 89.1 fL (ref 78.0–100.0)
MCV: 92.2 fL (ref 78.0–100.0)
Platelets: 127 10*3/uL — ABNORMAL LOW (ref 150–400)
Platelets: 154 10*3/uL (ref 150–400)
RBC: 2.63 MIL/uL — ABNORMAL LOW (ref 3.87–5.11)
RBC: 3.22 MIL/uL — ABNORMAL LOW (ref 3.87–5.11)
RDW: 19.4 % — ABNORMAL HIGH (ref 11.5–15.5)
RDW: 20.2 % — ABNORMAL HIGH (ref 11.5–15.5)
RDW: 17.5 % — ABNORMAL HIGH (ref 11.5–15.5)
RDW: 17.9 % — ABNORMAL HIGH (ref 11.5–15.5)

## 2010-07-08 LAB — URINALYSIS, MICROSCOPIC ONLY
Glucose, UA: 100 mg/dL — AB
Specific Gravity, Urine: >1.03 — ABNORMAL HIGH (ref 1.005–1.030)
Urobilinogen, UA: 0.2 mg/dL (ref 0.0–1.0)

## 2010-07-08 LAB — RETICULOCYTES
Retic Count, Absolute: 141.7 10*3/uL (ref 19.0–186.0)
Retic Ct Pct: 4.4 % — ABNORMAL HIGH (ref 0.4–3.1)

## 2010-07-08 LAB — IRON AND TIBC
Saturation Ratios: 50 % (ref 20–55)
TIBC: 208 ug/dL — ABNORMAL LOW (ref 250–470)
TIBC: 224 ug/dL — ABNORMAL LOW (ref 250–470)

## 2010-07-08 LAB — IMMUNOFIXATION ELECTROPHORESIS
IgA: 34 mg/dL — ABNORMAL LOW (ref 68–378)
IgG (Immunoglobin G), Serum: 864 mg/dL (ref 694–1618)

## 2010-07-08 LAB — FOLATE: Folate: 6.1 ng/mL

## 2010-07-08 LAB — FERRITIN: Ferritin: 680 ng/mL — ABNORMAL HIGH (ref 10–291)

## 2010-07-08 LAB — VITAMIN B12: Vitamin B-12: 178 pg/mL — ABNORMAL LOW (ref 211–911)

## 2010-07-09 ENCOUNTER — Ambulatory Visit (HOSPITAL_COMMUNITY): Payer: Medicare HMO | Admitting: Oncology

## 2010-07-09 DIAGNOSIS — C9000 Multiple myeloma not having achieved remission: Secondary | ICD-10-CM

## 2010-07-09 LAB — CBC
HCT: 23.5 % — ABNORMAL LOW (ref 36.0–46.0)
Hemoglobin: 9.5 g/dL — ABNORMAL LOW (ref 12.0–15.0)
MCH: 29 pg (ref 26.0–34.0)
MCH: 29.3 pg (ref 26.0–34.0)
MCHC: 32.9 g/dL (ref 30.0–36.0)
MCHC: 33.1 g/dL (ref 30.0–36.0)
MCHC: 33.4 g/dL (ref 30.0–36.0)
MCV: 86.5 fL (ref 78.0–100.0)
Platelets: 127 10*3/uL — ABNORMAL LOW (ref 150–400)
Platelets: 56 10*3/uL — ABNORMAL LOW (ref 150–400)
RBC: 3.47 MIL/uL — ABNORMAL LOW (ref 3.87–5.11)
RDW: 16.9 % — ABNORMAL HIGH (ref 11.5–15.5)
RDW: 18.5 % — ABNORMAL HIGH (ref 11.5–15.5)

## 2010-07-09 LAB — COMPREHENSIVE METABOLIC PANEL
AST: 16 U/L (ref 0–37)
Albumin: 3.1 g/dL — ABNORMAL LOW (ref 3.5–5.2)
BUN: 25 mg/dL — ABNORMAL HIGH (ref 6–23)
Calcium: 9.2 mg/dL (ref 8.4–10.5)
Calcium: 9.3 mg/dL (ref 8.4–10.5)
Creatinine, Ser: 1.56 mg/dL — ABNORMAL HIGH (ref 0.4–1.2)
GFR calc Af Amer: 40 mL/min — ABNORMAL LOW (ref >60)
Glucose, Bld: 132 mg/dL — ABNORMAL HIGH (ref 70–99)
Sodium: 138 mEq/L (ref 135–145)
Total Protein: 6.3 g/dL (ref 6.0–8.3)
Total Protein: 6.7 g/dL (ref 6.0–8.3)

## 2010-07-09 LAB — ABO/RH: ABO/RH(D): A POS

## 2010-07-09 LAB — DIFFERENTIAL
Basophils Relative: 1 % (ref 0–1)
Eosinophils Absolute: 0 10*3/uL (ref 0.0–0.7)
Eosinophils Relative: 0 % (ref 0–5)
Lymphocytes Relative: 4 % — ABNORMAL LOW (ref 12–46)
Lymphs Abs: 0.4 10*3/uL — ABNORMAL LOW (ref 0.7–4.0)
Lymphs Abs: 0.6 10*3/uL — ABNORMAL LOW (ref 0.7–4.0)
Monocytes Absolute: 0.4 10*3/uL (ref 0.1–1.0)
Monocytes Absolute: 0.8 10*3/uL (ref 0.1–1.0)
Monocytes Relative: 1 % — ABNORMAL LOW (ref 3–12)
Monocytes Relative: 8 % (ref 3–12)
Neutro Abs: 8.6 10*3/uL — ABNORMAL HIGH (ref 1.7–7.7)
Neutrophils Relative %: 75 % (ref 43–77)
Neutrophils Relative %: 92 % — ABNORMAL HIGH (ref 43–77)

## 2010-07-09 LAB — CROSSMATCH: ABO/RH(D): A POS

## 2010-07-09 LAB — PROTEIN ELECTROPHORESIS, SERUM
Beta 2: 4 % (ref 3.2–6.5)
Gamma Globulin: 10.9 % — ABNORMAL LOW (ref 11.1–18.8)
M-Spike, %: 0.32 g/dL

## 2010-07-09 LAB — IMMUNOFIXATION ELECTROPHORESIS
IgA: 41 mg/dL — ABNORMAL LOW (ref 68–378)
IgG (Immunoglobin G), Serum: 777 mg/dL (ref 694–1618)
IgM, Serum: 45 mg/dL — ABNORMAL LOW (ref 60–263)

## 2010-07-10 LAB — COMPREHENSIVE METABOLIC PANEL
ALT: 12 U/L (ref 0–35)
AST: 13 U/L (ref 0–37)
Alkaline Phosphatase: 32 U/L — ABNORMAL LOW (ref 39–117)
Alkaline Phosphatase: 40 U/L (ref 39–117)
BUN: 82 mg/dL — ABNORMAL HIGH (ref 6–23)
BUN: 9 mg/dL (ref 6–23)
CO2: 24 mEq/L (ref 19–32)
CO2: 30 mEq/L (ref 19–32)
Calcium: 9.3 mg/dL (ref 8.4–10.5)
Chloride: 106 mEq/L (ref 96–112)
Creatinine, Ser: 3.68 mg/dL — ABNORMAL HIGH (ref 0.4–1.2)
GFR calc non Af Amer: 12 mL/min — ABNORMAL LOW (ref >60)
GFR calc non Af Amer: >60 mL/min (ref >60)
Glucose, Bld: 96 mg/dL (ref 70–99)
Potassium: 4.7 mEq/L (ref 3.5–5.1)
Sodium: 137 mEq/L (ref 135–145)
Total Bilirubin: 0.4 mg/dL (ref 0.3–1.2)
Total Protein: 7.9 g/dL (ref 6.0–8.3)

## 2010-07-10 LAB — DIFFERENTIAL
Basophils Absolute: 0 10*3/uL (ref 0.0–0.1)
Basophils Absolute: 0 10*3/uL (ref 0.0–0.1)
Basophils Relative: 0 % (ref 0–1)
Eosinophils Relative: 4 % (ref 0–5)
Lymphocytes Relative: 14 % (ref 12–46)
Lymphocytes Relative: 36 % (ref 12–46)
Monocytes Absolute: 1.7 10*3/uL — ABNORMAL HIGH (ref 0.1–1.0)
Monocytes Absolute: 0.3 10*3/uL (ref 0.1–1.0)
Monocytes Relative: 7 % (ref 3–12)
Neutro Abs: 14.9 10*3/uL — ABNORMAL HIGH (ref 1.7–7.7)
Neutro Abs: 2.5 10*3/uL (ref 1.7–7.7)
Neutrophils Relative %: 77 % (ref 43–77)
Neutrophils Relative %: 53 % (ref 43–77)

## 2010-07-10 LAB — CBC
HCT: 27.5 % — ABNORMAL LOW (ref 36.0–46.0)
HCT: 27.6 % — ABNORMAL LOW (ref 36.0–46.0)
HCT: 29.8 % — ABNORMAL LOW (ref 36.0–46.0)
Hemoglobin: 9 g/dL — ABNORMAL LOW (ref 12.0–15.0)
Hemoglobin: 9.6 g/dL — ABNORMAL LOW (ref 12.0–15.0)
MCHC: 32.6 g/dL (ref 30.0–36.0)
MCHC: 32.4 g/dL (ref 30.0–36.0)
MCV: 85.9 fL (ref 78.0–100.0)
RBC: 3.21 MIL/uL — ABNORMAL LOW (ref 3.87–5.11)
RBC: 3.33 MIL/uL — ABNORMAL LOW (ref 3.87–5.11)
RDW: 16.5 % — ABNORMAL HIGH (ref 11.5–15.5)
RDW: 17.1 % — ABNORMAL HIGH (ref 11.5–15.5)
RDW: 16.5 % — ABNORMAL HIGH (ref 11.5–15.5)
WBC: 19.3 10*3/uL — ABNORMAL HIGH (ref 4.0–10.5)
WBC: 20.2 10*3/uL — ABNORMAL HIGH (ref 4.0–10.5)

## 2010-07-10 LAB — BASIC METABOLIC PANEL
BUN: 85 mg/dL — ABNORMAL HIGH (ref 6–23)
Calcium: 8.7 mg/dL (ref 8.4–10.5)
Chloride: 108 mEq/L (ref 96–112)
GFR calc Af Amer: 25 mL/min — ABNORMAL LOW (ref >60)
GFR calc non Af Amer: 12 mL/min — ABNORMAL LOW (ref >60)
GFR calc non Af Amer: 21 mL/min — ABNORMAL LOW (ref >60)
Potassium: 3.9 mEq/L (ref 3.5–5.1)
Potassium: 4.2 mEq/L (ref 3.5–5.1)
Sodium: 135 mEq/L (ref 135–145)
Sodium: 139 mEq/L (ref 135–145)

## 2010-07-10 LAB — PROTEIN ELECTROPH W RFLX QUANT IMMUNOGLOBULINS
Alpha-1-Globulin: 4.5 % (ref 2.9–4.9)
Alpha-2-Globulin: 9.1 % (ref 7.1–11.8)
Total Protein ELP: 8.6 g/dL — ABNORMAL HIGH (ref 6.0–8.3)

## 2010-07-10 LAB — IGG, IGA, IGM: IgG (Immunoglobin G), Serum: 3130 mg/dL — ABNORMAL HIGH (ref 694–1618)

## 2010-07-11 LAB — BASIC METABOLIC PANEL
BUN: 26 mg/dL — ABNORMAL HIGH (ref 6–23)
CO2: 23 mEq/L (ref 19–32)
Chloride: 109 mEq/L (ref 96–112)
Glucose, Bld: 113 mg/dL — ABNORMAL HIGH (ref 70–99)
Potassium: 3.9 mEq/L (ref 3.5–5.1)

## 2010-07-11 LAB — DIFFERENTIAL
Basophils Absolute: 0 10*3/uL (ref 0.0–0.1)
Basophils Absolute: 0 10*3/uL (ref 0.0–0.1)
Basophils Relative: 0 % (ref 0–1)
Basophils Relative: 0 % (ref 0–1)
Eosinophils Absolute: 0 10*3/uL (ref 0.0–0.7)
Eosinophils Relative: 0 % (ref 0–5)
Lymphocytes Relative: 12 % (ref 12–46)
Monocytes Absolute: 0.5 10*3/uL (ref 0.1–1.0)
Neutro Abs: 11.6 10*3/uL — ABNORMAL HIGH (ref 1.7–7.7)
Neutrophils Relative %: 86 % — ABNORMAL HIGH (ref 43–77)

## 2010-07-11 LAB — CBC
HCT: 30.5 % — ABNORMAL LOW (ref 36.0–46.0)
Hemoglobin: 9.4 g/dL — ABNORMAL LOW (ref 12.0–15.0)
Hemoglobin: 9.9 g/dL — ABNORMAL LOW (ref 12.0–15.0)
MCH: 27.8 pg (ref 26.0–34.0)
MCV: 86.5 fL (ref 78.0–100.0)
RBC: 3.38 MIL/uL — ABNORMAL LOW (ref 3.87–5.11)
RBC: 3.49 MIL/uL — ABNORMAL LOW (ref 3.87–5.11)
WBC: 13.4 10*3/uL — ABNORMAL HIGH (ref 4.0–10.5)

## 2010-07-11 LAB — IGG, IGA, IGM
IgA: 72 mg/dL (ref 68–378)
IgG (Immunoglobin G), Serum: 1190 mg/dL (ref 694–1618)
IgM, Serum: 73 mg/dL (ref 60–263)

## 2010-07-11 LAB — PROTEIN ELECTROPH W RFLX QUANT IMMUNOGLOBULINS
Albumin ELP: 52.6 % — ABNORMAL LOW (ref 55.8–66.1)
Alpha-1-Globulin: 6.1 % — ABNORMAL HIGH (ref 2.9–4.9)
Alpha-1-Globulin: 8.4 % — ABNORMAL HIGH (ref 2.9–4.9)
Alpha-2-Globulin: 15.1 % — ABNORMAL HIGH (ref 7.1–11.8)
Alpha-2-Globulin: 13.4 % — ABNORMAL HIGH (ref 7.1–11.8)
Beta 2: 6.1 % (ref 3.2–6.5)
Beta Globulin: 5.5 % (ref 4.7–7.2)
Beta Globulin: 5.3 % (ref 4.7–7.2)
M-Spike, %: 0.33 g/dL
Total Protein ELP: 6.9 g/dL (ref 6.0–8.3)

## 2010-07-11 LAB — COMPREHENSIVE METABOLIC PANEL
ALT: 13 U/L (ref 0–35)
AST: 18 U/L (ref 0–37)
Alkaline Phosphatase: 37 U/L — ABNORMAL LOW (ref 39–117)
BUN: 28 mg/dL — ABNORMAL HIGH (ref 6–23)
BUN: 21 mg/dL (ref 6–23)
CO2: 23 mEq/L (ref 19–32)
CO2: 26 mEq/L (ref 19–32)
Chloride: 104 mEq/L (ref 96–112)
Chloride: 104 mEq/L (ref 96–112)
Creatinine, Ser: 3.94 mg/dL — ABNORMAL HIGH (ref 0.4–1.2)
GFR calc Af Amer: 14 mL/min — ABNORMAL LOW (ref >60)
GFR calc non Af Amer: 11 mL/min — ABNORMAL LOW (ref >60)
Glucose, Bld: 141 mg/dL — ABNORMAL HIGH (ref 70–99)
Potassium: 4.1 mEq/L (ref 3.5–5.1)
Total Bilirubin: 0.7 mg/dL (ref 0.3–1.2)
Total Bilirubin: 0.3 mg/dL (ref 0.3–1.2)

## 2010-07-11 LAB — IMMUNOFIXATION ELECTROPHORESIS: Total Protein ELP: 7.9 g/dL (ref 6.0–8.3)

## 2010-07-11 LAB — IMMUNOFIXATION ADD-ON

## 2010-07-12 LAB — DIFFERENTIAL
Basophils Absolute: 0 10*3/uL (ref 0.0–0.1)
Basophils Absolute: 0 10*3/uL (ref 0.0–0.1)
Basophils Relative: 0 % (ref 0–1)
Eosinophils Absolute: 0.1 10*3/uL (ref 0.0–0.7)
Eosinophils Relative: 2 % (ref 0–5)
Lymphs Abs: 1.6 10*3/uL (ref 0.7–4.0)
Monocytes Absolute: 0.9 10*3/uL (ref 0.1–1.0)
Neutrophils Relative %: 61 % (ref 43–77)

## 2010-07-12 LAB — COMPREHENSIVE METABOLIC PANEL
ALT: 18 U/L (ref 0–35)
AST: 18 U/L (ref 0–37)
Alkaline Phosphatase: 49 U/L (ref 39–117)
CO2: 28 mEq/L (ref 19–32)
Calcium: 9.4 mg/dL (ref 8.4–10.5)
GFR calc Af Amer: >60 mL/min (ref >60)
GFR calc non Af Amer: 52 mL/min — ABNORMAL LOW (ref >60)
Glucose, Bld: 183 mg/dL — ABNORMAL HIGH (ref 70–99)
Potassium: 2.6 mEq/L — CL (ref 3.5–5.1)
Sodium: 139 mEq/L (ref 135–145)
Total Protein: 7.4 g/dL (ref 6.0–8.3)

## 2010-07-12 LAB — PROTEIN ELECTROPH W RFLX QUANT IMMUNOGLOBULINS
Albumin ELP: 49.5 % — ABNORMAL LOW (ref 55.8–66.1)
Alpha-1-Globulin: 6.3 % — ABNORMAL HIGH (ref 2.9–4.9)
Alpha-2-Globulin: 14.8 % — ABNORMAL HIGH (ref 7.1–11.8)
Beta Globulin: 5.6 % (ref 4.7–7.2)
M-Spike, %: 0.38 g/dL
Total Protein ELP: 7.2 g/dL (ref 6.0–8.3)

## 2010-07-12 LAB — CBC
HCT: 29.5 % — ABNORMAL LOW (ref 36.0–46.0)
Hemoglobin: 9.6 g/dL — ABNORMAL LOW (ref 12.0–15.0)
MCHC: 32.2 g/dL (ref 30.0–36.0)
MCHC: 33.8 g/dL (ref 30.0–36.0)
MCV: 87.7 fL (ref 78.0–100.0)
Platelets: 259 10*3/uL (ref 150–400)
RBC: 3.26 MIL/uL — ABNORMAL LOW (ref 3.87–5.11)
RDW: 18.4 % — ABNORMAL HIGH (ref 11.5–15.5)
WBC: 6.2 10*3/uL (ref 4.0–10.5)
WBC: 6.4 10*3/uL (ref 4.0–10.5)

## 2010-07-12 LAB — BASIC METABOLIC PANEL
BUN: 16 mg/dL (ref 6–23)
CO2: 23 mEq/L (ref 19–32)
Calcium: 9.2 mg/dL (ref 8.4–10.5)
Chloride: 105 mEq/L (ref 96–112)
Creatinine, Ser: 1.64 mg/dL — ABNORMAL HIGH (ref 0.4–1.2)
Creatinine, Ser: 2.68 mg/dL — ABNORMAL HIGH (ref 0.4–1.2)
GFR calc Af Amer: 38 mL/min — ABNORMAL LOW (ref >60)
GFR calc non Af Amer: 31 mL/min — ABNORMAL LOW (ref >60)
Glucose, Bld: 136 mg/dL — ABNORMAL HIGH (ref 70–99)
Potassium: 3.4 mEq/L — ABNORMAL LOW (ref 3.5–5.1)
Sodium: 138 mEq/L (ref 135–145)

## 2010-07-12 LAB — IMMUNOFIXATION ADD-ON

## 2010-07-12 LAB — IGG, IGA, IGM: IgA: 129 mg/dL (ref 68–378)

## 2010-07-12 LAB — POTASSIUM: Potassium: 3.2 mEq/L — ABNORMAL LOW (ref 3.5–5.1)

## 2010-07-13 LAB — COMPREHENSIVE METABOLIC PANEL
AST: 19 U/L (ref 0–37)
Albumin: 2.9 g/dL — ABNORMAL LOW (ref 3.5–5.2)
Alkaline Phosphatase: 40 U/L (ref 39–117)
BUN: 13 mg/dL (ref 6–23)
CO2: 30 mEq/L (ref 19–32)
Chloride: 103 mEq/L (ref 96–112)
GFR calc non Af Amer: >60 mL/min (ref >60)
Potassium: 2.9 mEq/L — ABNORMAL LOW (ref 3.5–5.1)
Total Bilirubin: 0.8 mg/dL (ref 0.3–1.2)

## 2010-07-13 LAB — DIFFERENTIAL
Basophils Absolute: 0 10*3/uL (ref 0.0–0.1)
Basophils Relative: 1 % (ref 0–1)
Eosinophils Relative: 6 % — ABNORMAL HIGH (ref 0–5)
Monocytes Absolute: 0.7 10*3/uL (ref 0.1–1.0)
Neutro Abs: 3.4 10*3/uL (ref 1.7–7.7)

## 2010-07-13 LAB — PROTEIN ELECTROPH W RFLX QUANT IMMUNOGLOBULINS
Albumin ELP: 50.2 % — ABNORMAL LOW (ref 55.8–66.1)
Alpha-1-Globulin: 5.6 % — ABNORMAL HIGH (ref 2.9–4.9)
Alpha-2-Globulin: 15.7 % — ABNORMAL HIGH (ref 7.1–11.8)
Beta Globulin: 6.4 % (ref 4.7–7.2)
Total Protein ELP: 6.2 g/dL (ref 6.0–8.3)

## 2010-07-13 LAB — CBC
HCT: 31.5 % — ABNORMAL LOW (ref 36.0–46.0)
Platelets: 145 10*3/uL — ABNORMAL LOW (ref 150–400)
RBC: 3.63 MIL/uL — ABNORMAL LOW (ref 3.87–5.11)
WBC: 5.9 10*3/uL (ref 4.0–10.5)

## 2010-07-14 LAB — COMPREHENSIVE METABOLIC PANEL
ALT: 16 U/L (ref 0–35)
Calcium: 8.1 mg/dL — ABNORMAL LOW (ref 8.4–10.5)
Glucose, Bld: 150 mg/dL — ABNORMAL HIGH (ref 70–99)
Sodium: 137 mEq/L (ref 135–145)
Total Protein: 6.8 g/dL (ref 6.0–8.3)

## 2010-07-14 LAB — CBC
HCT: 31.3 % — ABNORMAL LOW (ref 36.0–46.0)
Hemoglobin: 10.2 g/dL — ABNORMAL LOW (ref 12.0–15.0)
Hemoglobin: 11 g/dL — ABNORMAL LOW (ref 12.0–15.0)
MCHC: 32.8 g/dL (ref 30.0–36.0)
MCHC: 33.7 g/dL (ref 30.0–36.0)
MCV: 88.9 fL (ref 78.0–100.0)
RBC: 3.51 MIL/uL — ABNORMAL LOW (ref 3.87–5.11)
RDW: 16.3 % — ABNORMAL HIGH (ref 11.5–15.5)

## 2010-07-14 LAB — POCT CARDIAC MARKERS
CKMB, poc: 2.3 ng/mL (ref 1.0–8.0)
Myoglobin, poc: 89.5 ng/mL (ref 12–200)
Troponin i, poc: <0.05 ng/mL (ref 0.00–0.09)

## 2010-07-14 LAB — DIFFERENTIAL
Basophils Relative: 0 % (ref 0–1)
Eosinophils Absolute: 0.3 10*3/uL (ref 0.0–0.7)
Eosinophils Absolute: 0.4 10*3/uL (ref 0.0–0.7)
Lymphocytes Relative: 16 % (ref 12–46)
Lymphs Abs: 1.2 10*3/uL (ref 0.7–4.0)
Monocytes Absolute: 0.5 10*3/uL (ref 0.1–1.0)
Monocytes Relative: 9 % (ref 3–12)
Monocytes Relative: 12 % (ref 3–12)
Neutro Abs: 3.2 10*3/uL (ref 1.7–7.7)
Neutro Abs: 5 10*3/uL (ref 1.7–7.7)
Neutrophils Relative %: 66 % (ref 43–77)

## 2010-07-15 ENCOUNTER — Other Ambulatory Visit (HOSPITAL_COMMUNITY): Payer: Self-pay | Admitting: Oncology

## 2010-07-15 ENCOUNTER — Inpatient Hospital Stay (HOSPITAL_COMMUNITY): Payer: Medicare HMO

## 2010-07-15 ENCOUNTER — Encounter (HOSPITAL_COMMUNITY): Payer: Medicare HMO | Attending: Oncology

## 2010-07-15 ENCOUNTER — Encounter (HOSPITAL_COMMUNITY): Payer: Medicare HMO

## 2010-07-15 DIAGNOSIS — Z5111 Encounter for antineoplastic chemotherapy: Secondary | ICD-10-CM

## 2010-07-15 DIAGNOSIS — C9000 Multiple myeloma not having achieved remission: Secondary | ICD-10-CM

## 2010-07-16 LAB — COMPREHENSIVE METABOLIC PANEL
AST: 15 U/L (ref 0–37)
Albumin: 3.8 g/dL (ref 3.5–5.2)
Alkaline Phosphatase: 50 U/L (ref 39–117)
Chloride: 105 mEq/L (ref 96–112)
GFR calc Af Amer: >60 mL/min (ref >60)
Potassium: 3.5 mEq/L (ref 3.5–5.1)
Sodium: 138 mEq/L (ref 135–145)
Total Bilirubin: 0.5 mg/dL (ref 0.3–1.2)
Total Protein: 8 g/dL (ref 6.0–8.3)

## 2010-07-16 LAB — DIFFERENTIAL
Basophils Absolute: 0 10*3/uL (ref 0.0–0.1)
Basophils Relative: 1 % (ref 0–1)
Eosinophils Relative: 6 % — ABNORMAL HIGH (ref 0–5)
Lymphocytes Relative: 36 % (ref 12–46)
Monocytes Absolute: 0.6 10*3/uL (ref 0.1–1.0)
Monocytes Relative: 9 % (ref 3–12)

## 2010-07-16 LAB — PROTEIN ELECTROPH W RFLX QUANT IMMUNOGLOBULINS
Alpha-1-Globulin: 4 % (ref 2.9–4.9)
Alpha-2-Globulin: 9.2 % (ref 7.1–11.8)
Beta Globulin: 4.5 % — ABNORMAL LOW (ref 4.7–7.2)
M-Spike, %: 2.14 g/dL
Total Protein ELP: 8.3 g/dL (ref 6.0–8.3)

## 2010-07-16 LAB — CBC
Platelets: 220 10*3/uL (ref 150–400)
WBC: 6.4 10*3/uL (ref 4.0–10.5)

## 2010-07-16 LAB — IGG, IGA, IGM: IgM, Serum: 38 mg/dL — ABNORMAL LOW (ref 60–263)

## 2010-07-19 LAB — IMMUNOFIXATION ADD-ON

## 2010-07-19 LAB — IGG, IGA, IGM
IgA: 71 mg/dL (ref 68–378)
IgM, Serum: 54 mg/dL — ABNORMAL LOW (ref 60–263)

## 2010-07-22 ENCOUNTER — Inpatient Hospital Stay (HOSPITAL_COMMUNITY): Payer: Medicare HMO

## 2010-07-28 LAB — POTASSIUM: Potassium: 3.7 mEq/L (ref 3.5–5.1)

## 2010-07-29 ENCOUNTER — Inpatient Hospital Stay (HOSPITAL_COMMUNITY): Payer: Medicare HMO

## 2010-07-29 LAB — COMPREHENSIVE METABOLIC PANEL
ALT: 12 U/L (ref 0–35)
AST: 20 U/L (ref 0–37)
Alkaline Phosphatase: 42 U/L (ref 39–117)
CO2: 28 mEq/L (ref 19–32)
Calcium: 9.5 mg/dL (ref 8.4–10.5)
Chloride: 101 mEq/L (ref 96–112)
GFR calc Af Amer: >60 mL/min (ref >60)
GFR calc non Af Amer: 59 mL/min — ABNORMAL LOW (ref >60)
Glucose, Bld: 145 mg/dL — ABNORMAL HIGH (ref 70–99)
Potassium: 2.9 mEq/L — ABNORMAL LOW (ref 3.5–5.1)
Sodium: 137 mEq/L (ref 135–145)
Total Bilirubin: 0.7 mg/dL (ref 0.3–1.2)

## 2010-07-29 LAB — IGG, IGA, IGM
IgG (Immunoglobin G), Serum: 2990 mg/dL — ABNORMAL HIGH (ref 694–1618)
IgM, Serum: 33 mg/dL — ABNORMAL LOW (ref 60–263)

## 2010-07-29 LAB — DIFFERENTIAL
Basophils Relative: 0 % (ref 0–1)
Eosinophils Absolute: 0.2 10*3/uL (ref 0.0–0.7)
Eosinophils Relative: 4 % (ref 0–5)
Lymphs Abs: 2.1 10*3/uL (ref 0.7–4.0)
Neutrophils Relative %: 54 % (ref 43–77)

## 2010-07-29 LAB — UIFE/LIGHT CHAINS/TP QN, 24-HR UR
Albumin, U: DETECTED
Beta, Urine: DETECTED — AB
Free Kappa Lt Chains,Ur: 13.1 mg/dL — ABNORMAL HIGH (ref 0.04–1.51)
Free Lambda Lt Chains,Ur: 11.7 mg/dL — ABNORMAL HIGH (ref 0.08–1.01)
Gamma Globulin, Urine: DETECTED — AB
Time: 24 hours
Total Protein, Urine-Ur/day: 233 mg/d — ABNORMAL HIGH (ref 10–140)

## 2010-07-29 LAB — PROTEIN ELECTROPH W RFLX QUANT IMMUNOGLOBULINS
Albumin ELP: 45.9 % — ABNORMAL LOW (ref 55.8–66.1)
Beta 2: 3.5 % (ref 3.2–6.5)
Gamma Globulin: 33 % — ABNORMAL HIGH (ref 11.1–18.8)

## 2010-07-29 LAB — CBC
Hemoglobin: 10.6 g/dL — ABNORMAL LOW (ref 12.0–15.0)
RBC: 3.65 MIL/uL — ABNORMAL LOW (ref 3.87–5.11)
WBC: 6 10*3/uL (ref 4.0–10.5)

## 2010-07-29 LAB — CREATININE CLEARANCE, URINE, 24 HOUR
Collection Interval-CRCL: 24 hours
Creatinine, Urine: 134.89 mg/dL
Urine Total Volume-CRCL: 900 mL

## 2010-07-29 LAB — IMMUNOFIXATION ADD-ON

## 2010-07-29 LAB — PROTEIN, URINE, 24 HOUR
Protein, 24H Urine: 234 mg/d — ABNORMAL HIGH (ref 50–100)
Protein, Urine: 26 mg/dL

## 2010-07-30 LAB — CBC
RBC: 3.41 MIL/uL — ABNORMAL LOW (ref 3.87–5.11)
WBC: 4.6 10*3/uL (ref 4.0–10.5)

## 2010-07-31 LAB — CBC
HCT: 31.4 % — ABNORMAL LOW (ref 36.0–46.0)
Hemoglobin: 10.5 g/dL — ABNORMAL LOW (ref 12.0–15.0)
MCV: 87.2 fL (ref 78.0–100.0)
RBC: 3.6 MIL/uL — ABNORMAL LOW (ref 3.87–5.11)
WBC: 5.5 10*3/uL (ref 4.0–10.5)

## 2010-08-01 LAB — CBC
HCT: 31 % — ABNORMAL LOW (ref 36.0–46.0)
MCHC: 33.1 g/dL (ref 30.0–36.0)
MCV: 87.8 fL (ref 78.0–100.0)
Platelets: 213 10*3/uL (ref 150–400)
RDW: 18.1 % — ABNORMAL HIGH (ref 11.5–15.5)
WBC: 4.9 10*3/uL (ref 4.0–10.5)

## 2010-08-02 LAB — CBC
HCT: 29 % — ABNORMAL LOW (ref 36.0–46.0)
Platelets: 201 10*3/uL (ref 150–400)
RDW: 18.9 % — ABNORMAL HIGH (ref 11.5–15.5)

## 2010-08-03 LAB — CBC
Hemoglobin: 10.4 g/dL — ABNORMAL LOW (ref 12.0–15.0)
RDW: 18.4 % — ABNORMAL HIGH (ref 11.5–15.5)

## 2010-08-04 LAB — CBC
Hemoglobin: 10.4 g/dL — ABNORMAL LOW (ref 12.0–15.0)
MCHC: 32.5 g/dL (ref 30.0–36.0)
RBC: 3.72 MIL/uL — ABNORMAL LOW (ref 3.87–5.11)

## 2010-08-05 ENCOUNTER — Inpatient Hospital Stay (HOSPITAL_COMMUNITY): Payer: Medicare HMO

## 2010-08-05 LAB — COMPREHENSIVE METABOLIC PANEL
ALT: 13 U/L (ref 0–35)
AST: 17 U/L (ref 0–37)
Albumin: 3.5 g/dL (ref 3.5–5.2)
Alkaline Phosphatase: 54 U/L (ref 39–117)
GFR calc Af Amer: >60 mL/min (ref >60)
Potassium: 3.7 mEq/L (ref 3.5–5.1)
Sodium: 136 mEq/L (ref 135–145)
Total Protein: 8.1 g/dL (ref 6.0–8.3)

## 2010-08-05 LAB — UIFE/LIGHT CHAINS/TP QN, 24-HR UR
Albumin, U: DETECTED
Alpha 1, Urine: DETECTED — AB
Alpha 2, Urine: DETECTED — AB
Free Kappa Lt Chains,Ur: 13.7 mg/dL — ABNORMAL HIGH (ref 0.04–1.51)
Free Kappa/Lambda Ratio: 2.8 ratio (ref 0.46–4.00)
Gamma Globulin, Urine: DETECTED — AB
Volume, Urine: 700 mL

## 2010-08-05 LAB — CREATININE CLEARANCE, URINE, 24 HOUR
Collection Interval-CRCL: 24 hours
Creatinine Clearance: 109 mL/min (ref 75–115)
Creatinine, 24H Ur: 1363 mg/d (ref 700–1800)
Creatinine, Urine: 194.75 mg/dL

## 2010-08-05 LAB — PROTEIN ELECTROPH W RFLX QUANT IMMUNOGLOBULINS
Albumin ELP: 43.6 % — ABNORMAL LOW (ref 55.8–66.1)
Alpha-1-Globulin: 6 % — ABNORMAL HIGH (ref 2.9–4.9)
Beta 2: 2.5 % — ABNORMAL LOW (ref 3.2–6.5)
Total Protein ELP: 8.2 g/dL (ref 6.0–8.3)

## 2010-08-05 LAB — CBC
HCT: 31.5 % — ABNORMAL LOW (ref 36.0–46.0)
Hemoglobin: 10.4 g/dL — ABNORMAL LOW (ref 12.0–15.0)
Platelets: 270 10*3/uL (ref 150–400)
Platelets: 223 10*3/uL (ref 150–400)
RDW: 19.4 % — ABNORMAL HIGH (ref 11.5–15.5)
RDW: 18.7 % — ABNORMAL HIGH (ref 11.5–15.5)
WBC: 5.2 10*3/uL (ref 4.0–10.5)

## 2010-08-05 LAB — IMMUNOFIXATION ELECTROPHORESIS
IgA: 33 mg/dL — ABNORMAL LOW (ref 68–378)
IgG (Immunoglobin G), Serum: 2660 mg/dL — ABNORMAL HIGH (ref 694–1618)
Total Protein ELP: 8.2 g/dL (ref 6.0–8.3)

## 2010-08-05 LAB — DIFFERENTIAL
Basophils Relative: 0 % (ref 0–1)
Eosinophils Absolute: 0.3 10*3/uL (ref 0.0–0.7)
Monocytes Absolute: 0.5 10*3/uL (ref 0.1–1.0)
Monocytes Relative: 9 % (ref 3–12)

## 2010-08-05 LAB — IGG, IGA, IGM
IgA: 38 mg/dL — ABNORMAL LOW (ref 68–378)
IgG (Immunoglobin G), Serum: 3310 mg/dL — ABNORMAL HIGH (ref 694–1618)

## 2010-08-05 LAB — IMMUNOFIXATION ADD-ON

## 2010-08-06 ENCOUNTER — Inpatient Hospital Stay (HOSPITAL_COMMUNITY): Payer: Medicare HMO

## 2010-08-06 ENCOUNTER — Encounter (HOSPITAL_COMMUNITY): Payer: Medicare HMO | Attending: Oncology | Admitting: Oncology

## 2010-08-06 ENCOUNTER — Other Ambulatory Visit (HOSPITAL_COMMUNITY): Payer: Self-pay | Admitting: Oncology

## 2010-08-06 ENCOUNTER — Encounter (HOSPITAL_COMMUNITY): Payer: Medicare HMO

## 2010-08-06 ENCOUNTER — Ambulatory Visit (HOSPITAL_COMMUNITY)
Admission: RE | Admit: 2010-08-06 | Discharge: 2010-08-06 | Disposition: A | Discharge status: Home / Self Care | Payer: Medicare HMO | Source: Ambulatory Visit | Attending: Oncology | Admitting: Oncology

## 2010-08-06 DIAGNOSIS — M51379 Other intervertebral disc degeneration, lumbosacral region without mention of lumbar back pain or lower extremity pain: Secondary | ICD-10-CM | POA: Insufficient documentation

## 2010-08-06 DIAGNOSIS — M5380 Other specified dorsopathies, site unspecified: Secondary | ICD-10-CM | POA: Insufficient documentation

## 2010-08-06 DIAGNOSIS — Z87898 Personal history of other specified conditions: Secondary | ICD-10-CM | POA: Insufficient documentation

## 2010-08-06 DIAGNOSIS — C9000 Multiple myeloma not having achieved remission: Secondary | ICD-10-CM | POA: Insufficient documentation

## 2010-08-06 DIAGNOSIS — M545 Low back pain, unspecified: Secondary | ICD-10-CM | POA: Insufficient documentation

## 2010-08-06 DIAGNOSIS — G839 Paralytic syndrome, unspecified: Secondary | ICD-10-CM

## 2010-08-06 DIAGNOSIS — Z5111 Encounter for antineoplastic chemotherapy: Secondary | ICD-10-CM

## 2010-08-06 DIAGNOSIS — Z85038 Personal history of other malignant neoplasm of large intestine: Secondary | ICD-10-CM | POA: Insufficient documentation

## 2010-08-06 DIAGNOSIS — C7951 Secondary malignant neoplasm of bone: Secondary | ICD-10-CM

## 2010-08-06 DIAGNOSIS — M5137 Other intervertebral disc degeneration, lumbosacral region: Secondary | ICD-10-CM | POA: Insufficient documentation

## 2010-08-06 DIAGNOSIS — M5126 Other intervertebral disc displacement, lumbar region: Secondary | ICD-10-CM | POA: Insufficient documentation

## 2010-08-06 DIAGNOSIS — Z79899 Other long term (current) drug therapy: Secondary | ICD-10-CM | POA: Insufficient documentation

## 2010-08-06 DIAGNOSIS — C7952 Secondary malignant neoplasm of bone marrow: Secondary | ICD-10-CM

## 2010-08-09 ENCOUNTER — Encounter (HOSPITAL_COMMUNITY): Payer: Medicare HMO | Admitting: Oncology

## 2010-08-09 DIAGNOSIS — C9000 Multiple myeloma not having achieved remission: Secondary | ICD-10-CM

## 2010-08-09 LAB — CBC
Hemoglobin: 9.4 g/dL — ABNORMAL LOW (ref 12.0–15.0)
Platelets: 248 10*3/uL (ref 150–400)
RDW: 16.3 % — ABNORMAL HIGH (ref 11.5–15.5)
WBC: 6.3 10*3/uL (ref 4.0–10.5)

## 2010-08-10 LAB — COMPREHENSIVE METABOLIC PANEL
ALT: 13 U/L (ref 0–35)
Alkaline Phosphatase: 48 U/L (ref 39–117)
BUN: 9 mg/dL (ref 6–23)
CO2: 31 mEq/L (ref 19–32)
GFR calc non Af Amer: >60 mL/min (ref >60)
Glucose, Bld: 106 mg/dL — ABNORMAL HIGH (ref 70–99)
Potassium: 3.5 mEq/L (ref 3.5–5.1)
Sodium: 139 mEq/L (ref 135–145)
Total Bilirubin: 0.5 mg/dL (ref 0.3–1.2)

## 2010-08-10 LAB — RETICULOCYTES: Retic Ct Pct: 1.5 % (ref 0.4–3.1)

## 2010-08-10 LAB — PROTEIN ELECTROPHORESIS, SERUM
Alpha-2-Globulin: 5.4 % — ABNORMAL LOW (ref 7.1–11.8)
Beta 2: 4.7 % (ref 3.2–6.5)
Beta Globulin: 9.4 % — ABNORMAL HIGH (ref 4.7–7.2)
Gamma Globulin: 37 % — ABNORMAL HIGH (ref 11.1–18.8)
M-Spike, %: 2.65 g/dL
Total Protein ELP: 8.8 g/dL — ABNORMAL HIGH (ref 6.0–8.3)

## 2010-08-10 LAB — DIFFERENTIAL
Basophils Absolute: 0.1 10*3/uL (ref 0.0–0.1)
Basophils Relative: 1 % (ref 0–1)
Eosinophils Absolute: 0.3 10*3/uL (ref 0.0–0.7)
Neutrophils Relative %: 54 % (ref 43–77)

## 2010-08-10 LAB — MAGNESIUM: Magnesium: 1.9 mg/dL (ref 1.5–2.5)

## 2010-08-10 LAB — CBC
HCT: 30.9 % — ABNORMAL LOW (ref 36.0–46.0)
Hemoglobin: 10 g/dL — ABNORMAL LOW (ref 12.0–15.0)
RBC: 3.48 MIL/uL — ABNORMAL LOW (ref 3.87–5.11)

## 2010-08-10 LAB — IMMUNOFIXATION ELECTROPHORESIS: IgA: 38 mg/dL — ABNORMAL LOW (ref 68–378)

## 2010-08-11 ENCOUNTER — Ambulatory Visit (HOSPITAL_COMMUNITY): Payer: Medicare HMO | Admitting: Oncology

## 2010-08-12 ENCOUNTER — Inpatient Hospital Stay (HOSPITAL_COMMUNITY): Payer: Medicare HMO

## 2010-08-19 ENCOUNTER — Other Ambulatory Visit (HOSPITAL_COMMUNITY): Payer: Self-pay | Admitting: Oncology

## 2010-08-19 ENCOUNTER — Encounter (HOSPITAL_COMMUNITY): Payer: Medicare HMO

## 2010-08-19 DIAGNOSIS — C9000 Multiple myeloma not having achieved remission: Secondary | ICD-10-CM

## 2010-08-19 DIAGNOSIS — D649 Anemia, unspecified: Secondary | ICD-10-CM

## 2010-08-20 LAB — CROSSMATCH
ABO/RH(D): A POS
Antibody Screen: NEGATIVE
Unit division: 0

## 2010-08-25 ENCOUNTER — Other Ambulatory Visit (HOSPITAL_COMMUNITY): Payer: Self-pay | Admitting: Nephrology

## 2010-08-25 DIAGNOSIS — N189 Chronic kidney disease, unspecified: Secondary | ICD-10-CM

## 2010-08-26 ENCOUNTER — Inpatient Hospital Stay (HOSPITAL_COMMUNITY): Payer: Medicare HMO

## 2010-08-27 ENCOUNTER — Inpatient Hospital Stay (HOSPITAL_COMMUNITY): Payer: Medicare HMO

## 2010-08-27 ENCOUNTER — Encounter (HOSPITAL_COMMUNITY): Payer: Medicare HMO | Attending: Oncology

## 2010-08-27 ENCOUNTER — Encounter (HOSPITAL_COMMUNITY): Payer: Medicare HMO

## 2010-08-27 ENCOUNTER — Other Ambulatory Visit (HOSPITAL_COMMUNITY): Payer: Self-pay | Admitting: Oncology

## 2010-08-27 DIAGNOSIS — C9000 Multiple myeloma not having achieved remission: Secondary | ICD-10-CM

## 2010-08-27 DIAGNOSIS — Z79899 Other long term (current) drug therapy: Secondary | ICD-10-CM | POA: Insufficient documentation

## 2010-08-27 DIAGNOSIS — N19 Unspecified kidney failure: Secondary | ICD-10-CM

## 2010-08-27 DIAGNOSIS — Z5111 Encounter for antineoplastic chemotherapy: Secondary | ICD-10-CM

## 2010-08-27 LAB — DIFFERENTIAL
Basophils Absolute: 0 10*3/uL (ref 0.0–0.1)
Basophils Relative: 0 % (ref 0–1)
Eosinophils Absolute: 0 10*3/uL (ref 0.0–0.7)
Eosinophils Relative: 1 % (ref 0–5)
Lymphocytes Relative: 14 % (ref 12–46)

## 2010-08-27 LAB — CBC
MCH: 30.2 pg (ref 26.0–34.0)
MCV: 89.8 fL (ref 78.0–100.0)
Platelets: 137 10*3/uL — ABNORMAL LOW (ref 150–400)
RDW: 15.8 % — ABNORMAL HIGH (ref 11.5–15.5)
WBC: 7.1 10*3/uL (ref 4.0–10.5)

## 2010-08-30 ENCOUNTER — Ambulatory Visit (HOSPITAL_COMMUNITY): Payer: Medicare HMO | Admitting: Oncology

## 2010-09-02 ENCOUNTER — Inpatient Hospital Stay (HOSPITAL_COMMUNITY): Payer: Medicare HMO

## 2010-09-06 ENCOUNTER — Ambulatory Visit (HOSPITAL_COMMUNITY)
Admission: RE | Admit: 2010-09-06 | Discharge: 2010-09-06 | Disposition: A | Discharge status: Home / Self Care | Payer: Medicare HMO | Source: Ambulatory Visit | Attending: Nephrology | Admitting: Nephrology

## 2010-09-06 DIAGNOSIS — Q619 Cystic kidney disease, unspecified: Secondary | ICD-10-CM | POA: Insufficient documentation

## 2010-09-06 DIAGNOSIS — N189 Chronic kidney disease, unspecified: Secondary | ICD-10-CM

## 2010-09-07 NOTE — Assessment & Plan Note (Signed)
NAME:  [name], [name]                  CHART#:  [id number]   DATE:  11/14/2007                       DOB:  11/19/1940   Followup GERD/gastroparesis/constipation with history of colon cancer  and history of abnormal lesion in the esophagus.  She was last seen on  October 18, 2007.  She had a EUS by Dr. Christella Hartigan.  She was found to have what  was felt to be a prominent submucosal blood vessel.  She had been on  Naprosyn and this was stopped.  She is on Kapidex 60 mg orally daily and  Reglan 5 mg a.c. and at bedtime for her nausea.  Reflux symptoms have  slowed down tremendously.  She is not having any abdominal pain.  She is  moving her bowels regularly with a fiber supplement and MiraLax 17 g  orally bedtime p.r.n.  Last colonoscopy was in March of this year.  She  checked out just fine and will  not need another colonoscopy for 5  years.   CURRENT MEDICATIONS:  See updated list.   ALLERGIES:  No known drug allergies.   PHYSICAL EXAMINATION:  GENERAL:  She appears well.  VITAL SIGNS:  Weight 242.5, height 5 feet 5 inches, temp 98.6, BP  122/80, and pulse 84.  SKIN:  Warm and dry.  CHEST:  Lungs are clear to auscultation.  CARDIAC:  Regular rate and rhythm without murmur, gallop, or rub.  ABDOMEN:  Nondistended.  Positive bowel sounds.  No succussion splash.  Soft and nontender.  No appreciable mass or organomegaly.   ASSESSMENT:  1. Gastroesophageal reflux disease/gastroparesis, well controlled on      Reglan and Kapidex.  We talked again about the potential side      effects of tardive dyskinesia and dystonia.  She is on a relatively      low dose, but clinically she is getting a prokinetic effect.      Continue Kapidex as well as Reglan as outlined above.  2. History of colon cancer status post negative colonoscopy in March      2009.  3. Constipation, well controlled on daily fiber supplement and nightly      MiraLax p.r.n.  We will continue that regimen.   Unless something comes  up, I plan to see this nice lady back in 1 year.       Jonathon Bellows, M.D.  Electronically Signed     RMR/MEDQ  D:  11/14/2007  T:  11/15/2007  Job:  [id number]   cc:   Dr. Jen Mow

## 2010-09-07 NOTE — Assessment & Plan Note (Signed)
NAME:  [name], [name]                  CHART#:  [id number]   DATE:  01/03/2008                       DOB:  Jan 13, 1941   PRIMARY CARE PHYSICIAN:  Erle Crocker, MD.   CHIEF COMPLAINT:  Followup GERD, gastroparesis, and constipation.   PROBLEM LIST:  1. Chronic gastroesophageal reflux disease.  2. Gastroparesis.  3. Constipation.  4. History of colon cancer, status post left hemicolectomy in 1995.  5. Last normal residual colonoscopy on July 02, 2007, by Dr. Jena Gauss.  6. History of esophageal bleb with endoscopic ultrasound by Dr. Christella Hartigan      on Sep 21, 2007.  7. Osteoarthritis.  8. Status post appendectomy.  9. Status post hysterectomy.  10.Status post knee, heel, and neck surgery.  11.Hypertension.  12.Coronary artery disease, status post cath in 2005.  13.Stable 1-cm right adrenal adenoma in CT on June 28, 2007.   SUBJECTIVE:  The patient is a 70 year old African American female.  She  continues to complain of reflux coming from the bottom of her stomach up  her esophagus and she feels a pressure to her epigastrium and mid  abdomen.  It generally lasts about 10 minutes at a time and happens  several times a day everyday.  Her weight has remained stable.  She  continues to remain on Reglan 5 mg a.c. and nightly.  She is supposed to  be taking omeprazole 20 mg b.i.d.  However, her bottles says once daily  and I believe she has been taking it.  She also has a prescription for  Kapidex, but has not been able to get this covered by her insurance.  So, she has not been taking that either.  She tells me she noticed no  difference when she did take the Kapidex.  She does take GlycoLax 17 g  daily as needed for constipation pretty much everyday of the week.  She  denies any nausea or vomiting.  She tells me she only eats one large  meal per day.  At times, the discomfort is 9/10 on pain scale to her  abdomen.  She denies any NSAID use.  She does take an occasional  Tylenol.  Her weight  has remained stable.  There is no particular  aggravating or alleviating factors for her pain.   CURRENT MEDICATIONS:  See the list from 01/03/2008.   ALLERGIES:  No known drug allergies.   OBJECTIVE:  VITAL SIGNS:  Weight 246 pounds, height 65 inches,  temperature 98.4, blood pressure 122/78, and pulse 60.  GENERAL:  The patient is an obese [place] American female who is alert,  oriented, pleasant, and cooperative, in no acute distress.  HEENT:  Sclerae clear and nonicteric.  Conjunctivae pink.  Oropharynx  pink and moist without any lesions.  CHEST:  Heart regular rate and rhythm.  Normal S1 and S2.  ABDOMEN:  Protuberant with positive bowel sounds x4.  No bruits  auscultated.  She does have a large right lower quadrant scar, which is  well healed from previous surgery.  Abdomen is soft, nontender, and  nondistended without palpable mass or hepatosplenomegaly.  No rebound,  tenderness, or guarding.  EXTREMITIES:  With 1+ pretibial edema bilaterally.   ASSESSMENT:  1. The patient is a 70 year old African American female with      gastroparesis and chronic gastroesophageal reflux  disease.  She has      not been on b.i.d. PPI as directed due to some confusion.  She is      using Reglan and seems to be getting a prokinetic effect.  She is      not having nausea and vomiting at this time.  2. She has history of colon cancer, status post last normal      colonoscopy was in March 2009.  She is status post left      hemicolectomy in 1995.  3. Chronic constipation, well controlled on p.r.n. MiraLax.  4. History of esophageal bleb benign   PLAN:  1. She really needs to eat 4 to 6 small meals per day instead of 1      large food bolus and I have stressed this to her today.  We would      like her to remain on gastroparesis diet.  2. Increase her omeprazole to 20 mg b.i.d.  3. Continue p.r.n. MiraLax 17 g daily.  4. Discontinue Kapidex.  5. Continue Reglan 5 mg a.c. and  nightly.       Lorenza Burton, N.P.  Electronically Signed     R. Roetta Sessions, M.D.  Electronically Signed    KJ/MEDQ  D:  01/03/2008  T:  01/03/2008  Job:  [id number]   cc:   Erle Crocker, M.D.

## 2010-09-07 NOTE — Assessment & Plan Note (Signed)
NAME:  [name], [name]                  CHART#:  [id number]   DATE:  09/19/2007                       DOB:  02/05/1941   PRIMARY CARE PHYSICIAN:  Dr. Jen Mow   CHIEF COMPLAINT:  Abdominal pain.   PROBLEM LIST:  1. Esophageal bleb with the EUS by Dr. Christella Hartigan on Sep 13, 2007.  2. Retained solid food contents on EUS by Dr. Christella Hartigan.  3. Small hiatal hernia.  Esophageal bleb on EGD by Dr. Jena Gauss August 22, 2007.  4. History of colon cancer status post left hemicolectomy back in      1995.  5. Normal colonoscopy of residual colon July 02, 2007, by Dr. Jena Gauss.  6. Osteoarthritis.  7. Status post appendectomy.  8. Status post hysterectomy.  9. Status post knee, heal and neck surgery.  10.Hypertension, hypercholesterolemia.  11.Coronary artery disease status post cardiac catheterization in 2005  12.Chronic constipation.  13.Stable 1 cm right adrenal adenoma on CT June 28, 2007.   SUBJECTIVE:  [name] is a 70 year old African American female who  recently underwent EGD by Dr. Jena Gauss followed by EES by Dr. Christella Hartigan in  [place].  She is felt to have esophageal bleb.  She continues to  complain of a burning sensation which radiates from her mid abdomen up  through her upper abdomen to her chest.  She tells me she feels that the  food is crawling up her esophagus.  If she swallows water, it seems to  push down the food.  She is taking Kapidex 60 mg every morning.  She has  lost a total of 10 pounds in the past 6 weeks.  However, she tells me  she has been halving her portion size and intentionally trying to lose  weight.  She rates her pain 9/10 on pain scale when she has symptoms.  She is quite confused about her medications today, but brings a list and  tells me she is taking the naproxen 500 mg b.i.d. as well as aspirin 81  mg daily and admits to an occasional Aleve as well.  She denies any  nausea, vomiting, heartburn or indigestion other than described chest  pressure and discomfort which  is intermittent throughout the day.  Pain  usually last 2-3 hours.  She has history of chronic constipation.  She  usually has about two bowel movements per week.   CURRENT MEDICATIONS:  See the list from Sep 19, 2007.   ALLERGIES:  No known drug allergies.   OBJECTIVE:  VITAL SIGNS:  Weight 240 pounds, height 64 inches,  temperature 99.1, blood pressure 150/104 (the patient has not taken her  antihypertensives today), pulse 88.  GENERAL:  [name] is an obese African American feels female who is  alert, oriented, pleasant, cooperative, in no acute distress.  HEENT:  Sclerae are clear, nonicteric.  Conjunctivae pink.  Oropharynx  pink and moist without any lesions.  NECK:  Supple without any mass or thyromegaly.  CHEST:  Heart regular rate and rhythm.  Normal S1-S2 without murmurs,  clicks, rubs or gallops.  LUNGS:  Clear to auscultation bilaterally.  ABDOMEN:  Positive bowel sounds x4.  No bruits auscultated.  Soft,  nontender, nondistended without palpable mass or hepatosplenomegaly.  No  rebound tenderness or guarding.  EXTREMITIES:  Without clubbing.  She  does have 1+ bilateral ankle edema.   ASSESSMENT:  [name] is a 70 year old female with this sensation of  food crawling up her esophagus.  On recent EUS, she was noted to have  retained solid foods contents, query gastroparesis.  I suspect  gastroparesis can be contributing to her symptoms and can work this up  further.  It is difficult to ascertain whether she is being compliant  with her medications.  Her blood pressure is up this morning and she  admits to not taking her blood pressure prescription.  However, she is  quite confused about her medications today.  She does seem to be taking  a significant amount of NSAIDs which could be contributing to her  dyspepsia as well.  She does have chronic nausea.  She has chronic  constipation as well, is not taking anything for this at this time.  She  has a history of colon  cancer.   PLAN:  1. She is encouraged to take her medications as directed.  We have      gone over her medication list today.  She is to take her blood      pressure pills and she tells me she has run out and will go to the      pharmacy today.  2. Would like her to try Tylenol arthritis for her arthritic pain      instead of Aleve and naproxen.  3. Gastric emptying study.  4. Continue Kapidex 60 mg daily.  5. Colonoscopy March 2014.  6. Hold hyoscyamine.  7. MiraLax 17 grams daily for constipation.       Lorenza Burton, N.P.  Electronically Signed     R. Roetta Sessions, M.D.  Electronically Signed    KJ/MEDQ  D:  09/19/2007  T:  09/20/2007  Job:  [id number]   cc:   Erle Crocker, M.D.

## 2010-09-07 NOTE — Op Note (Signed)
NAME:  KHAMIL, LAMICA                 ACCOUNT NO.:  [id number]   MEDICAL RECORD NO.:  [id number]          PATIENT TYPE:  AMB   LOCATION:  DAY                           FACILITY:  [facility]   PHYSICIAN:  R. Roetta Sessions, M.D. DATE OF BIRTH:  07/21/1940   DATE OF PROCEDURE:  07/02/2007  DATE OF DISCHARGE:                               OPERATIVE REPORT   PROCEDURE:  Surveillance colonoscopy.   INDICATIONS FOR PROCEDURE:  A 70 year old lady status post left  hemicolectomy for colorectal cancer back in 1995.  She has had some  nonspecific abdominal pain in setting of constipation.  She has some  upper abdominal symptoms sometimes after she eats, which is relieved by  drinking a large glass of water.  She has not had any melena or rectal  bleeding.  I note that she is taking Naprosyn, Aleve, and aspirin, and  we did a detailed medication review today and the best we can tell, she  is not on any acid suppression therapy.  She did undergo a CT to the  abdomen and pelvis which demonstrated a stable abdomen status post let  hemicolectomy and hysterectomy.  Colonoscopy is now being done.  This  procedure was discussed with the patient at length, the potential risks,  benefits, and alternatives, limitations have bene reviewed.  Please see  documents placed on the medical record.   PROCEDURE IN DETAIL:  Oxygen saturation, blood pressure, pulse,  respirations were monitored at the time of the procedure.  Conscious  sedation Versed 3 mg of IV, Demerol 75 mg IV, divided doses.  The  instrument, Pentax video system.   FINDINGS:  Digital rectal exam revealed no abnormalities and the prep  was adequate.  Colon:  All mucosa was surveyed from the rectosigmoid  junction all the way to the cecum.  Cecum, ileocecal valve, appendiceal  orifice was seen and photographed for the record.  ______________  episode of previous _________.  Surgical anastomosis was identified 35  cm from the anal verge and appeared  normal, as did the remainder of the  residual colon.  The scope was pulled out of the rectum, where thorough  examination of the rectal mucosa, including retroflexed view  demonstrated no abnormalities.  The patient tolerated the procedure  well, with reactive endoscopy.   IMPRESSION:  Normal rectum status post prior sigmoid resection with  normal anastomosis identified at 35 cm.  Normal residual colon.   From office labs from June 28, 2007, white count was 6.0, H and H 10.1  and 32.6, MCV 93.9.  Chem-20 looked good except for slightly elevated  protein at 8.5.  TSH normal at 1.693.  Iron studies came back normal  with iron sat at 26%, serum iron 71, TIBC 274, vitamin B12 564, folate  greater than 20, ferritin 105.   I am concerned about this patient's poly nonsteroidal anti-inflammatory  drugs use.  She is at an increased risk of a major nonsteroidal anti-  inflammatory drugs-related complication and as best I can tell, she is  not on any acid suppression therapy.  She is taking  Tums, on a regular  basis, however.   RECOMMENDATIONS:  1. Repeat colonoscopy in 5 years.  2. If NSAID therapy is needed, strongly recommend that she use either      Naprosyn or Aleve, but not both.  Will      add a proton pump inhibitor empirically, Prilosec 21 orally daily.  3. Will have her come back to see [place] in the office in 1 month and      assess her progress.  If she is not markedly improve would have a      low threshold for looking further at her upper GI tract.      Jonathon Bellows, M.D.  Electronically Signed     RMR/MEDQ  D:  07/02/2007  T:  07/02/2007  Job:  [id number]   cc:   Erle Crocker, M.D.

## 2010-09-07 NOTE — H&P (Signed)
NAME:  [name], [name]                 ACCOUNT NO.:  [id number]   MEDICAL RECORD NO.:  [id number]          PATIENT TYPE:  AMB   LOCATION:  DAY                           FACILITY:  [facility]   PHYSICIAN:  Kassie Mends, M.D.      DATE OF BIRTH:  08-10-1940   DATE OF ADMISSION:  DATE OF DISCHARGE:  [facility]                              HISTORY & PHYSICAL   PRIMARY CARE PHYSICIAN:  Dr. Jen Mow.   CHIEF COMPLAINT:  Heartburn and indigestion.   HISTORY OF PRESENT ILLNESS:  [name] is a 70 year old female.  She  has been followed by Dr. Jena Ray for constipation and abdominal pain.  Workup thus far has included a CT of abdomen and pelvis with contrast on  June 28, 2007,  which showed a 1-cm right adrenal adenoma and otherwise  negative exam.  She had a normocytic anemia with a hemoglobin of 10.1,  hematocrit of 32.6 on June 27, 2007.  She did have an EO count of 400  and 7%.  She did have a normal CMP and a normal TSH.  She had an anemia  panel, which showed a normal iron, normal ferritin, folate and B12 as  well as TIBC.  She had a colonoscopy by Dr. Jena Ray on July 02, 2007,  given her history of colon cancer.  She had a normal rectum, status post  prior sigmoid resection with normal anastomosis identified at 35 cm  normal residual colon.  She is on a significant amount of NSAIDs.  She  was previously using Aleve twice a day as well as naproxen twice a day  and was not on a PPI.  She has, however, been placed on a PPI and is  taking omeprazole b.i.d.  She continues to take naproxen b.i.d. as well.  She has been using  FiberChoice and Metamucil for constipation.  This  does seem to help.  She continues to complain of a sensation that feels  like a heart attack, where she feels a warm sensation across her  epigastrium and chest.  At times she becomes diaphoretic.  She has had a  cardiac workup which was normal.   CURRENT MEDICATIONS:  Hyoscyamine 0.125 mg b.i.d., simvastatin 80 mg  daily, Flexeril 10 mg  b.i.d., Ditropan 10 mg b.i.d., naproxen 500 mg  b.i.d., aspirin 81 mg daily, omeprazole 20 mg b.i.d., and she believes  she takes a blood pressure pill, possibly Tarka, but she does not have  it with her today.   ALLERGIES:  No known drug allergies.   PAST MEDICAL HISTORY:  Hypertension, hypercholesteremia, history of  coronary artery disease, status post catheterization in 2005.  She has a  history of colon cancer, status post left hemicolectomy for colorectal  carcinoma back in 1995.  She had a normal surveillance colonoscopy of  residual colon on July 02, 2007, by Dr. Jena Ray.  She has a history of  osteoarthritis, appendectomy, hysterectomy, knee surgery, heel surgery,  neck surgery.   FAMILY HISTORY:  Mother and father had congestive heart failure.  She  had a brother who had colon cancer  at age 23.  She has a son with MS.   SOCIAL HISTORY:  She is single.  She has 4 children.  She is unemployed.  She has a remote history of tobacco use, quitting in 1992.  She denies  any alcohol or drug use.   REVIEW OF SYSTEMS:  See HPI.  She has had problems with hypertension  recently.  She is hypertensive in the office today.  She tells me she  was recently started on a new medication, however.  She denies any  dizziness or weakness.  Denies any headaches.   PHYSICAL EXAMINATION:  VITAL SIGNS:  Weight is 250 pounds, height 64  inches, temperature 98.6, blood pressure was 184/112.  It was rechecked  by me and was 160/110, and then again at the end of visit 160/102.  GENERAL:  [name] is an obese female who is alert, oriented pleasant,  cooperative, in no acute distress.  HEENT:  Sclerae are clear.  Nonicteric.  Conjunctivae are pink.  Oropharynx pink and moist without  any lesions.  NECK:  Supple without any mass or thyromegaly.  CHEST:  Heart regular rate and rhythm.  Normal S1, S2.  No murmurs, clicks, rubs  or gallops.  LUNGS:  Clear to auscultation bilaterally.  ABDOMEN:  She has a  large  scar to the right lower quadrant as well as vertical midline scar, both  of which are healed from previous surgeries.  Protuberant with positive  bowel sounds x4.  No bruits auscultated.  Soft, nontender, nondistended  without palpable mass or hepatosplenomegaly.  No rebound tenderness or  guarding.  Examination is limited by the patient's body habitus.  EXTREMITIES:  Without clubbing.  She does have trace bilateral lower  extremity edema.   IMPRESSION:  Ms. [name] is a 70 year old female with refractory  heartburn and indigestion despite b.i.d. PPI.  She has a history of  being on a significant amount of NSAIDs.  Currently she is on naproxen  b.i.d.  She had a mild normocytic anemia.  She needs further evaluation  to rule out peptic ulcer disease as well as complicated gastroesophageal  reflux disease and erosive esophagitis.   Her hypertension is concerning, and I would like her to follow up with  Dr. Jen Mow as soon as possible.  We will have our office call to make her  appointment within the next day or two.   She has a history of colon cancer, status post left hemicolectomy, doing  well.   PLAN:  1. Surveillance colonoscopy March of 2014.  2. EGD with Dr. Jena Ray in the near future.  Discussed the procedure and      risks and benefits, including but not limited to bleeding,      infection, perforation, drug reaction.  She agrees with the plan,      and consent will be obtained.  3. Office visit with Dr. Jen Mow ASAP.  We will schedule for her today.  4. Omeprazole 20 mg b.i.d.      Lorenza Burton, N.P.      Kassie Mends, M.D.  Electronically Signed    KJ/MEDQ  D:  08/01/2007  T:  08/01/2007  Job:  [id number]   cc:   Erle Crocker, M.D.

## 2010-09-07 NOTE — Consult Note (Signed)
NAME:  Diane, Ray                 ACCOUNT NO.:  [id number]   MEDICAL RECORD NO.:  [id number]          PATIENT TYPE:  AMB   LOCATION:  DAY                           FACILITY:  [facility]   PHYSICIAN:  R. Roetta Sessions, M.D. DATE OF BIRTH:  03-Sep-1940   DATE OF CONSULTATION:  06/27/2007  DATE OF DISCHARGE:                                 CONSULTATION   REASON FOR CONSULTATION:  Constipation, abdominal pain, consult for  colonoscopy.   HISTORY OF PRESENT ILLNESS:  [name] is a very pleasant 70 year old  Atrial fibrillation female who presents today for further evaluation of  constipation and abdominal pain, in consideration for colonoscopy. She  gives a quite vague description of recent abdominal pain and says this  has been going on for what sounds like a month or so. She states that  she began feeling discomfort that begins in the pelvis and goes straight  up the middle of her abdomen, into the epigastrium. This lasts for up to  an hour at a time. This happens a couple of times a day. She states the  pain goes away when she takes a couple of big gulps of water. She denies  any associated changes in bowels. Generally she is chronically  constipated, however. She denies any blood in her stool or rectal  bleeding or melena. As long as she is on her __________ her heartburn is  controlled. She denies any history of vomiting. The patient denies  dysphagia. None of her symptoms are postprandial.   CURRENT MEDICATIONS:  1. Levsin 0.125 mg b.i.d.  2. Simvastatin __________ daily.  3. Flexeril 10 mg b.i.d.  4. Ditropan 10 mg t.i.d.  5. Naproxen 500 mg b.i.d.  6. Aleve p.r.n.  7. Aspirin 81 mg daily.  8. __________ 40 mg daily.   ALLERGIES:  NO KNOWN DRUG ALLERGIES.   PAST MEDICAL HISTORY:  1. Hypertension.  2. Hypercholesterolemia.  3. History of coronary artery disease status post catheterization in      2005.  4. Osteoarthritis.  5. History of colon cancer, status post resection  in 1995. She did not      require any adjuvant therapy. Last colonoscopy December 2005 by Dr.      Elpidio Anis was normal. Anastomosis appeared to be normal as well.  6. Appendectomy.  7. Hysterectomy.  8. Knee surgery.  9. Heel surgery.  10.Neck surgery.   FAMILY HISTORY:  Both mother and father had heart failure. She has a  brother who had surgery for colon cancer at age 2. She has a son with  MS.   SOCIAL HISTORY:  She is single. She has 4 children. She is unemployed.  She quit smoking in 1992. She smoked for 15 years, 1/4 pack per day. No  alcohol use or drug use.   REVIEW OF SYSTEMS:  GASTROINTESTINAL:  See HPI. CONSTITUTIONAL:  No  weight loss. CARDIOPULMONARY:  No chest pain. No shortness of breath. No  palpitations or cough. GENITOURINARY:  No dysuria or hematuria.   PHYSICAL EXAMINATION:  VITAL SIGNS:  Weight 248. Height 5 foot 4.  Temperature 98.3, blood pressure 160/102. Pulse 88.  GENERAL:  Pleasant, obese, African-American female in no acute distress.  SKIN:  Warm and dry. No jaundice.  HEENT:  Sclerae nonicteric. Oropharyngeal mucosa moist and pink. No  lesions, erythema, or exudate. She has dentures. No lymphadenopathy.  CHEST:  Lungs clear to auscultation.  CARDIOVASCULAR:  Regular rate and rhythm. Normal S1 and S2. No murmur,  rub, or gallop.  ABDOMEN:  Positive bowel sounds. Very obese, soft. Nontender. No  organomegaly or masses. No rebound, no guarding. No abdominal bruits or  hernias.  EXTREMITIES:  No edema.   IMPRESSION:  [name] is a pleasant 70 year old lady with vague  description of recent abdominal pain. The pain is intermittent. She  describes it resolving after taking big gulps of water. She has chronic  constipation, which is not controlled. She has a personal history of  colorectal cancer with last colonoscopy over 3 years ago. Will pursue  surveillance colonoscopy in the near future. In addition, she had a CT  back in 2005 with a liver  nodule and a right adrenal nodule. Followup CT  was recommenced, as it did not display typical characteristics of an  adenoma. Will go ahead and pursue this at this time, in addition to  further evaluating her abdominal pain. I note that she is on Naproxen  and occasional Aleve, along with aspirin. She is at increased risk for  gastrointestinal irritation and peptic ulcer disease. At this point in  time, she is however, not displaying any symptoms suggestive of this but  if her workup is negative, I may pursue EGD as well.   PLAN:  1. Colonoscopy in the near future with Dr. Jena Gauss.  2. CT of the abdomen and pelvis with IV and oral contrast.  3. MiraLax 17 grams daily as needed.  4. Fiber-Choice 2 tablets daily.  5. CBC, C-met, and TSH.   I would like to thank Dr. Erle Crocker for allowing [place] to take part in  the care of this patient.      [name], P.AJonathon Ray, M.D.  Electronically Signed    LL/MEDQ  D:  06/27/2007  T:  06/27/2007  Job:  [id number]   cc:   Erle Crocker, M.D.

## 2010-09-07 NOTE — Assessment & Plan Note (Signed)
NAME:  [name], [name]                  CHART#:  [id number]   DATE:  10/18/2007                       DOB:  May 04, 1940   FOLLOWUP:  Abdominal pain, blue lesion in esophagus felt to be aberrant  blood vessel per [facility], Dr. Christella Hartigan, retained food in stomach suspicious  for gastroparesis.  History of colon cancer, status post left  hemicolectomy back in 1995.  She is due for colonoscopy in 2014.  We  have stopped her Naprosyn and hyoscyamine.  Recently, we obtained a  gastric emptying study, which revealed some delaying gastric emptying  with 30% of trace remaining in the stomach in 2 hours.  We started her  on Reglan 5 mg a.c. and at bedtime.  However, it is not clear to me at  this point if she is taking it.  She has not familiar with the term  Reglan and metoclopramide.  Her medication list does not reflect Reglan.  She does continue taking Capadex 60 mg orally daily based on her current  med list.  She is taking MiraLax 17 g orally at bedtime p.r.n.  constipation.  Overall, she says she is feeling much better and is happy  with her progress.  Her weight is up 2 pounds since her Sep 19, 2007,  visit.   CURRENT MEDICATIONS:  See updated list (conformation pending via [facility]).   ALLERGIES:  No known drug allergies.   PHYSICAL EXAMINATION:  GENERAL:  Today, pleasant 70 year old lady  resting comfortably, 242, height 5 feet 5 inches, temperature 92, BP  160/70, and pulse 70.  SKIN:  Warm and dry.  ABDOMEN:  Obese.  Positive bowel sounds.  No succussion splash.  Soft  and nontender without appreciable mass or organomegaly.   ASSESSMENT:  Element of gastroesophageal reflux disease/gastroparesis.  I am concerned that she may not have actually started on Reglan at this  time, we need to research with the pharmacy.  My intention is for to be  on Reglan 5 mg a.c. and at bedtime.  We talked about side effects.  We  will plan to see her back in 1 month from now and will see that she  gets  on this medication.  She is to continue Capadex 60 mg orally daily.  She  is to continue MiraLax 70 g orally nightly for constipation.  We would  prefer for her to avoid nonsteroidal agents from here now.       Jonathon Bellows, M.D.  Electronically Signed    RMR/MEDQ  D:  10/18/2007  T:  10/19/2007  Job:  [id number]

## 2010-09-07 NOTE — Op Note (Signed)
NAME:  [name], [name]                 ACCOUNT NO.:  [id number]   MEDICAL RECORD NO.:  [id number]          PATIENT TYPE:  AMB   LOCATION:  DAY                           FACILITY:  [facility]   PHYSICIAN:  R. Roetta Sessions, M.D. DATE OF BIRTH:  Aug 06, 1940   DATE OF PROCEDURE:  08/22/2007  DATE OF DISCHARGE:                               OPERATIVE REPORT   PROCEDURE:  Diagnostic esophagogastroduodenoscopy.   INDICATIONS FOR PROCEDURE:  This is a 70 year old lady with heartburn  symptoms/indigestion despite omeprazole 20 mg orally twice daily.  No  odynophagia.  No dysphagia.  She has been taking Naprosyn.  She has had  no improvement in her symptoms whatsoever with omeprazole.  EGD is now  being done to further evaluate her symptoms.  This approach was  discussed with the patient at length.  Potential risks, benefits and  alternatives have been reviewed, questions answered, agreeable.  Please  see documentation in medical record.   PROCEDURE NOTE:  O2 saturation, blood pressure, pulse, respirations were  monitored throughout entire procedure.   CONSCIOUS SEDATION:  Versed 4 mg IV, Demerol 100 grams IV  in divided  doses.  Cetacaine spray for topical pharyngeal anesthesia.   INSTRUMENT:  Pentax video chip system.   FINDINGS:  Examination of the tubular esophagus revealed a purplish,  circular, 1-cm, submucosal lesion just proximal to the EG junction.  Please see photos.  Esophageal mucosa otherwise appeared normal.  EG  junction easily traversed.  STOMACH:  Gastric cavity insufflated well with air.  Thorough  examination of gastric mucosa including retroflexed view of proximal  stomach, esophagogastric junction demonstrated only a small hiatal  hernia.  There was some problem with the scope lens, which produced  somewhat fuzzy image which was frustrating, but I felt the mucosal  surfaces were well seen.  The pylorus was patent, easily traversed.  Examination of the bulb and second portion  revealed no abnormalities.   THERAPEUTIC/DIAGNOSTIC MANEUVERS:  None.   The patient tolerated procedure well and was reactive in endoscopy.   IMPRESSION:  1. A circular, purplish, 1-cm, distal esophageal submucosal lesion of      uncertain significance, otherwise normal esophagus.  2. Small hiatal hernia, otherwise normal stomach, D1, D2.   RECOMMENDATIONS:  Stop omeprazole, begin Kapidex 60 mg orally daily.  The patient is to go by my office for some free samples.  The lesion of  the distal esophagus did not appear to be that of a typical varix given  the lack of other findings.  We will go ahead and set her up for  endoscopic ultrasound to further investigate this lesion by Dr. Christella Hartigan  down in [place] in the near future.  Further recommendations to  follow.      Jonathon Bellows, M.D.  Electronically Signed     RMR/MEDQ  D:  08/22/2007  T:  08/22/2007  Job:  [id number]   cc:   Erle Crocker, M.D.

## 2010-09-09 ENCOUNTER — Inpatient Hospital Stay (HOSPITAL_COMMUNITY): Payer: Medicare HMO

## 2010-09-10 ENCOUNTER — Inpatient Hospital Stay (HOSPITAL_COMMUNITY): Payer: Medicare HMO

## 2010-09-10 NOTE — H&P (Signed)
[facility]  Patient:    [name], [name] Visit Number: [id number] MRN: [id number]          Service Type: EMS Location: ED Attending Physician:  Ilean Skill Dictated by:   Elpidio Anis, M.D. Admit Date:  04/16/2001 Discharge Date: 04/16/2001                           History and Physical  HISTORY OF PRESENT ILLNESS:  This is a 70 year old female, with a history of colon cancer, five years status post sigmoid colectomy.  The patient last had colonoscopy in 1997.  She had polypoid disease at that time.  She has failed to have follow-up.  She has not had rectal bleeding.  She is scheduled for colonoscopy.  PAST MEDICAL HISTORY:  1. Hypertension.  2. Osteoarthritis.  3. Hypercholesterolemia.  PAST SURGICAL HISTORY:  Sigmoid colectomy.  MEDICATIONS:  1. DynaCirc CR 10 mg q.d.  2. Clorpres 0.1/15 mg b.i.d.  3. Celebrex 200 mg b.i.d.  4. Cozaar 50 mg q.d.  REVIEW OF SYSTEMS:  Positive for low back pain, pain of the knees with intermittent swelling of the right knee.  PHYSICAL EXAMINATION:  VITAL SIGNS:  Blood pressure 132/80, pulse 80, respirations 20.  Weight 218 pounds.  HEENT:  Unremarkable.  NECK:  Supple without JVD or bruits.  CHEST:  Clear to auscultation.  No rales, rubs, rhonchi, or wheezes.  HEART:  Regular rate and rhythm without murmur, gallop, or rub.  ABDOMEN:  Soft, nontender, no masses.  RECTAL:  Normal.  No masses.  Stool guaiac negative.  EXTREMITIES:  Increased crepitus of both knees, with positive effusion on the right side, with tenderness along the medial aspect.  No warmth noted.  NEUROLOGIC:  No motor, sensory, or cerebellar deficits.  IMPRESSION:  1. History of colon cancer and recurrent colon polyp.  2. Hypertension.  3. Osteoarthritis.  PLAN:  Total colonoscopy. Dictated by:   Elpidio Anis, M.D. Attending Physician:  Ilean Skill DD:  05/29/01 TD:  05/31/01 Job: [id number]

## 2010-09-10 NOTE — Op Note (Signed)
[facility]  Patient:    [name], [name] Visit Number: [id number] MRN: [id number]          Service Type: DSU Location: DAY Attending Physician:  Windle Guard Dictated by:   Darreld Mclean, M.D. Admit Date:  10/23/2001 Discharge Date: 10/23/2001                             Operative Report  PREOPERATIVE DIAGNOSIS:  Tear of medial and lateral menisci, left knee.  POSTOPERATIVE DIAGNOSES:  Fraying of the lateral meniscus; tear of the medial meniscus posterior horn, left knee.  PROCEDURE:  Operative arthroscopy, partial medial meniscectomy, debridement of frayed lateral meniscus.  SURGEON:  Darreld Mclean, M.D.  ASSISTANT:  Candace Cruise, P.A.-C.  ANESTHESIA:  Spinal.  TOURNIQUET TIME:  Twenty-two minutes.  DRAINS:  No drains.  INDICATIONS:  The patient is a 70 year old female with pain and tenderness in her left knee.  MRI shows tear of the medial and lateral meniscus.  She has not improved with conservative treatment, rest or anti-inflammatories.  Indications and imponderables and risks of the procedure were discussed with the patient, who appeared to agree and understand as outlined.  FINDINGS:  Suprapatellar pouch has mild changes.  Undersurface of patella -- grade 2 changes.  Medially, there was a tear in the posterior horn but grade 2 changes on articular surfaces.  Anterior cruciate was intact. Laterally, there was fraying of lateral meniscus but only grade 2 changes of articular surfaces.  No loose bodies.  DESCRIPTION OF PROCEDURE:  The patient was given spinal anesthesia and placed supine on the operating room table with tourniquet and leg holder on the left thigh.  The patients leg was prepped and draped in the usual fashion, elevated and wrapped circumferentially with an Esmarch bandage.  At this point in time, we again established this was the left leg, which was the correct leg, and we were planning to do arthroscopy.  Incision was  made after the tourniquet was inflated to 300 mmHg and inflow cannula inserted medially. Lactated Ringers was instilled into the knee by an infusion pump. Arthroscope was inserted laterally and knee systemically examined; please see findings above.  Permanent pictures were taken throughout the procedure. Posterior horn tear of the medial meniscus was identified and using a meniscal knife, meniscal shaver and meniscal punch, good smooth contour was obtained. Knee was systemically reexamined and no new pathology found.  The lateral meniscus was debrided where it was frayed.  Knee was again reexamined and no new pathology.  Wounds were irrigated with the remaining part of the lactated Ringers and wounds were reapproximated with 3-0 nylon in interrupted vertical mattress manner.  Marcaine 0.25% was instilled in each portal.  Tourniquet was deflated after 22 minutes, sterile dressing applied and bulky dressing applied.  The patient tolerated the procedure well.  Appropriate analgesic was given for pain.  I will see the patient in the office in approximately 10 days to 2 weeks.  For any difficulty, she is to contact me through the office or hospital beeper system. Dictated by:   Darreld Mclean, M.D. Attending Physician:  Windle Guard DD:  10/23/01 TD:  10/25/01 Job: [id number]

## 2010-09-10 NOTE — Cardiovascular Report (Signed)
NAME:  [name], [name]                           ACCOUNT NO.:  [id number]   MEDICAL RECORD NO.:  [id number]                   PATIENT TYPE:  OIB   LOCATION:  6525                                 FACILITY:  [facility]   PHYSICIAN:  Darlin Priestly, M.D.             DATE OF BIRTH:  Apr 05, 1941   DATE OF PROCEDURE:  10/20/2003  DATE OF DISCHARGE:                              CARDIAC CATHETERIZATION   PROCEDURES PERFORMED:  1. Left heart catheterization.  2. Coronary angiography.  3. Left ventriculogram.  4. Left anterior descending percutaneous transluminal coronary balloon     angioplasty.   CARDIOLOGIST:  Darlin Priestly, M.D.   COMPLICATIONS:  None.   INDICATIONS:  [name] is a 70 year old female, patient of Dr. Mirna Mires  in [place] and Dr. Kem Boroughs with a history of hypertension and  history of colon cancer who has a complaint of intermittent chest pain and  shortness of breath.  She did have a Cardiolite scan in June 2005 revealing  anterior thinning, but no significant ischemia and EF of 74%.  Because of  her ongoing symptoms she is now referred for cardiac catheterization to  assess her coronary status.   DESCRIPTION OF PROCEDURE:  After obtaining informed written consent the  patient was brought to the cardiac cath lab.  The right and left groins were  shaved, prepped and draped in the usual sterile fashion.  ECG monitoring was  established.  Using the modified Seldinger technique a #6 French arterial  sheath was inserted into the right femoral artery.  Six French diagnostic  catheters were used to perform diagnostic angiography.   This revealed a large left main with no significant disease.   The LAD is a large vessel that courses the apex, ______ two diagonal  branches.  The LAD was noted to have 80% apical stenosis.  The first  diagonal is a large vessel, which bifurcates in its mid segment and has 50%  noncritical disease in the proximal portion.  The  second diagonal is a small  vessel with no significant disease.   The left circumflex is a medium-sized vessel that courses the AV groove and  goes to two obtuse marginal branches.  AV groove circumflex has no  significant disease.  The first OM is a small vessel with no significant  disease.  The second OM is a medium-sized vessel with a 50% midvessel  lesion.   The right coronary artery is a large vessel and is dominant that gives rise  to a PDA as well as a posterolateral branch.  There is diffuse noncritical  20% disease in the distal portion of the RCA.  The PDA and posterolateral  branch are both medium size vessels.  There is 40% noncritical disease of  the proximal portion of the PDA with 30% proximal disease in the proximal  portion of the PLA.   Left ventriculogram reveals preserved EF  of 70%.   HEMODYNAMIC DATA:  Systemic arterial pressure 125/67.  LV _________ pressure  124/13.  LVEDP of 26.   INTERVENTIONAL PROCEDURE:  Interventional procedure of the LAD-distal:  Following diagnostic angiography a #6 [place] JL-4 guiding catheter was  engaged in the left coronary ostium.  Next a 0.014 short ATW marker wire was  advanced down the guiding catheter and positioned in the apical LAD without  difficulty.  Next, a Maverick 2.5 x 15 mm balloon was then positioned in the  distal LAD.  Two inflations to a maximum of four atmospheres were performed  for a total of 1 minute 15 seconds.  There was wasting in the midportion of  the balloon.  This balloon was then removed and a PowerSail 2.0 x 13 mm  balloon was then positioned across the stenosis.  One inflation to 16  atmospheres was performed for a total of 53 seconds.  It should be noted  there was full expansion of the balloon with no wasting.  Follow up  angiogram revealed approximately 50% residual stenosis in the apical portion  of the LAD.  IV Angiomax was used with ACT greater than 200.   Final orthogonal angiograms revealed  less than 50% residual stenosis in the  apical LAD with no evidence of dissection or thrombus, and TIMI III flow to  the distal vessel.  At this point it was elected to conclude the procedure.  All balloons, wires and catheters were removed.  Hemostatic sheath was sewn  in placed.   The patient was transferred back to the ward in satisfactory clinical  condition.   CONCLUSION:  1. Successful percutaneous transluminal coronary balloon angioplasty of the     apical left anterior descending stenotic lesion.  2. Normal left ventricular systolic function.  3. Elevated left ventricular end diastolic pressure.  4. Adjunct use of Angiomax infusion.                                               Darlin Priestly, M.D.    RHM/MEDQ  D:  10/20/2003  T:  10/20/2003  Job:  [id number]   cc:   Annia Friendly. Loleta Chance, M.D.  [id number]  [place]  [place] [address]  Fax: [phone]   Dani Gobble, MD  Fax: [phone]

## 2010-09-10 NOTE — H&P (Signed)
[name], [name]                 ACCOUNT NO.:  [id number]   MEDICAL RECORD NO.:  [id number]          PATIENT TYPE:  INP   LOCATION:  A304                          FACILITY:  [facility]   PHYSICIAN:  Annia Friendly. Loleta Chance, M.D.   DATE OF BIRTH:  06-24-1940   DATE OF ADMISSION:  02/19/2004  DATE OF DISCHARGE:  LH                                HISTORY & PHYSICAL   IDENTIFYING DATA:  The patient is a 70 year old divorced, gravida 3, para 3,  AB-0, black female from [place], [place].  She is complaining of  upper mid stomach pain.   HISTORY OF PRESENT ILLNESS:  The patient has been experiencing abdominal  pain off and on x4 months.  Stomach pain has been very severe over the past  24 hours.  The pain is described as sharp and intermittent.  The patient  took Tylenol arthritic medicine, Maalox, and TUMs without resolution of  pain.  The pain appeared to radiate across the stomach but not through her  back.  She denied nausea, vomiting, diarrhea, melena, dysuria, bloating and  weight loss.  The pain is not associated also with meals.  Due to the  severity of pain, the patient came to the office for evaluation.  The pain,  over the past 24 hours, caused the patient to bend over.   PAST MEDICAL HISTORY:  Negative for known peptic ulcer disease, gallbladder  disease, etc.   PAST MEDICAL HISTORY:  Positive for hypertension, hyperlipidemia and  osteoarthritis.   PAST MEDICAL HISTORY:  Negative for tuberculosis, sickle cell, asthma, and  seizure disorder.   HABITS:  Negative for cigarette smoking, ethanol use and street drugs.   ALLERGIES:  The patient is not allergic to any known medications.   ADMISSION MEDICATIONS:  1.  Cozaar 100 mg p.o. daily.  2.  Lescol XL 80 mg p.o. q. bedtime.  3.  Verelan P.M., 300 mg p.o. every bedtime.  4.  Clorpres 0.1/15 p.o. every day.   PAST MEDICAL HISTORY:  Positive hospitalization for appendectomy at age 43,  [facility] by Dr. Jackquline Bosch;  hysterectomy in her 67s secondary to  heavy menstrual bleeding; left knee surgery by Dr. Hilda Lias in March of 2003,  due to meniscal tear; colectomy by Dr.__________Smith secondary to colon  cancer in 1995.   FAMILY HISTORY:  Mother deceased secondary to complications of end-stage  lung disease. Father deceased secondary to complications of heart disease.  Two daughters living at age 52 with history of arthritis, age 25 with a  history of seizure disorder, two sons living at age 69 and age 91 with  history of multiple sclerosis.   OB HISTORY:  Gravida 5, para 4, stillbirth 1.   REVIEW OF SYSTEMS:  Positive for multiple joint pain involving the shoulders  and knees.  Review of systems is negative for chronic headache, epistaxis,  bleeding gums, dysphagia, chronic cough, hemoptysis, hematemesis, gross  hematuria, vaginal bleeding, vaginal itching, diarrhea, hematochezia, edema  of legs, night sweats, lumps in breast, etc.  Review of systems is positive  for chronic irritation of groin  due to hot weather.   PHYSICAL EXAMINATION:  GENERAL APPEARANCE:  A middle-aged, overweight,  medium height, black female, in no apparent respiratory distress.  VITAL SIGNS:  Blood pressures 59/76, pulse 74, respirations 20, temperature  97.9.  HEENT:  Normocephalic.  Ears, normal auricles.  External canals patent.  Tympanic membranes are pearly gray.  Eyes, lids negative for ptosis, sclerae  are white.  Pupils round, equal, reactive to light.  Extraocular movement  intact.  Nose negative for discharge.  Mouth, positive upper and lower  dentures, posterior pharynx benign.  No oral mucosa lesions.  NECK:  Negative for lymphadenopathy or thyromegaly.  Supraclavicular space,  no palpable nodes.  LUNGS:  Clear.  HEART:  Audible S1 and S2 without murmur.  Regular rate and rhythm.  BREASTS:  Large.  No skin changes.  No nodules on palpation. Positive dense  tissue throughout.  Nipple erect without discharge.   ABDOMEN:  Obese, hyperactive bowel sounds, soft.  Positive for old healed  right lower quadrant and old healed mid hypogastric surgical scars.  No  organomegaly or palpable masses.  EXTERNAL GENITALIA:  Normal female, groin positive for mild maceration,  gluteal fold positive for moderate maceration.  RECTAL:  No external lesions.  Digital exam, no palpable or rectal vault  masses.  Stool guaiac negative.  EXTREMITIES:  No atypical edema.  No joint swelling.  No joint redness.  No  joint hardness.  Positive for bilateral knee stiffness and some tenderness  with flexion.  Feet, palpable dorsalis pedis bilaterally.  NEUROLOGIC:  Alert and oriented to person, place and time.  Cranial nerves  II-XII appeared intact.   LABORATORY DATA:  White count 6.8, hemoglobin 11.2, hematocrit 34.8.  Platelets 265,000.  Sodium 137, potassium 3.4, chloride 103, CO2 27, glucose  85, BUN 9, creatinine 0.8, serum amylase 37, serum lipase 22.  Total CPK  125.  CK-MB 2.9.  Troponin I 0.01.  Urinalysis, specific gravity 1.020, pH  6.0, glucose greater than 1000 mg per dl.  No bilirubin, no ketones,  moderate amount of blood.  Protein greater than 300 mg/ dl.  Nitrite  negative.  There were 0-2 wbc's, 3-6 rbc's.   IMPRESSION:  1.  Primary chronic epigastric pain.  2.  Mild hypokalemia.  3.  Proteinuria.   SECONDARY DIAGNOSES:  1.  Hypertension.  2.  Hyperlipidemia.  3.  Osteoarthritis.   PLAN:  1.  Diagnostic esophagogastroduodenoscopy.  2.  X-ray of abdomen.  3.  IV Protonix.  4.  Fasting lipid profile.  5.  A 24-hour urine protein.  6.  Ultrasound of the abdomen.  7.  Potassium supplement.  8.  IV normal saline at 50 cc per hour.  9.  Carafate liquid 1 gram p.o., one hour before meals and bedtime.  10. Cozaar 100 mg p.o. daily.  11. Lescol XL 80 mg p.o. bedtime.  12. Verelan p.m. 300 mg p.o. q. bedtime.  13. Clorpres 0.1/ 15 p.o. every day.  14. Analgesic for pain. 15. Urine C&S.       GKH/MEDQ  D:  02/19/2004  T:  02/19/2004  Job:  [id number]

## 2010-09-10 NOTE — H&P (Signed)
   NAMEBRITTNYE, [name]                             ACCOUNT NO.:  [id number]   MEDICAL RECORD NO.:  [id number]                   PATIENT TYPE:   LOCATION:                                       FACILITY:   PHYSICIAN:  Dirk Dress. Katrinka Blazing, M.D.                DATE OF BIRTH:   DATE OF ADMISSION:  DATE OF DISCHARGE:                                HISTORY & PHYSICAL   HISTORY OF PRESENT ILLNESS:  [age]-year-old female with a history of colon  cancer.  She is five years status post colectomy.  The patient last  had  colonoscopy in 1997.  She had polypoid disease at that time.  She has failed  to have followup.  She denies rectal bleeding.  She is scheduled for  followup colonoscopy.   PAST MEDICAL HISTORY:  Past history is positive for hypertension,  osteoarthritis, hypercholesterolemia.   PAST SURGICAL HISTORY:  Sigmoid colon resection.   MEDICATIONS:  1. DynaCirc CR 2 mg q.d.  2. Clorpres 0.15/15 mg b.i.d.  3. Cozaar 50 mg q.d.   REVIEW OF SYSTEMS:  Review of systems is positive for chronic low back pain,  bilateral knee pain.   PHYSICAL EXAMINATION:  VITAL SIGNS:  Blood pressure 142/84, pulse 64,  respirations 20.  Weight 226 pounds.  HEENT:  Unremarkable.  NECK:  Supple without JVD or bruit.  CHEST:  Clear to auscultation.  HEART:  Regular rate and rhythm without murmur, gallop or rub.  ABDOMEN:  Soft and nontender.  No masses.  Normal bowel sounds.  EXTREMITIES:  Positive crepitus in both knees.  No warmth or effusion.  RECTAL:  Normal.  No masses.  Stool guaiac negative.  NEUROLOGIC:  No motor, sensory or cerebellar deficits.  Cranial nerves  intact.   IMPRESSION:  1. History of colon cancer with recurrent colon polyps.  2. Hypertension.  3. Osteoarthritis.   PLAN:  Total colonoscopy.                                               Dirk Dress. Katrinka Blazing, M.D.   LCS/MEDQ  D:  12/11/2001  T:  12/12/2001  Job:  [id number]

## 2010-09-10 NOTE — Discharge Summary (Signed)
[name], [name]                 ACCOUNT NO.:  [id number]   MEDICAL RECORD NO.:  [id number]          PATIENT TYPE:  INP   LOCATION:  A304                          FACILITY:  [facility]   PHYSICIAN:  Annia Friendly. Loleta Chance, M.D.   DATE OF BIRTH:  1940-08-14   DATE OF ADMISSION:  02/19/2004  DATE OF DISCHARGE:  10/30/2005LH                                 DISCHARGE SUMMARY   The patient was a 70 year old, divorced, gravida 3, para 3, AB 0 black  female from [place], [place].  Chief complaint was an upper mid  sternal pain.  History was positive for abdominal pain off and on for the  past 4 months.  Pain was severe 24 hours before admission.  The pain was  described as sharp and intermittent.  The patient took Tylenol arthritic  medicine, Maalox, and Tums without resolution of pain.  The pain appeared to  radiate across the sternum but not through back.  She denied nausea,  vomiting, diarrhea, melena, dysuria, bloating, and weight loss.  The pain  was not associated with meals.  Due to the severity of the pain, the patient  came to the hospital for evaluation.   MEDICAL HISTORY:  Positive for hypertension, hyperlipidemia, osteoarthritis.  The patient was not allergic to any known medication.   PAST MEDICAL HISTORY:  Positive for hospitalization for appendectomy at age  35 at [facility] by Dr. Jackquline Bosch; hysterectomy in her 74s secondary  to heavy menstrual bleeding; left knee surgery by Dr. Darreld Mclean in March  of 2003 due to meniscus tear; colectomy by Dr. Elpidio Anis secondary to  colon cancer in 1995.   FAMILY HISTORY:  Revealed mother deceased secondary to complications of end-  stage lung disease; father deceased secondary to complications of heart  disease; 2 daughters living age 42 with history of arthritis and age 81  history of seizure disorder; 2 sons living age 25 and 46 with history of  multiple sclerosis.   HOSPITAL COURSE:  Problem 1.  ABDOMINAL PAIN OF UNKNOWN  ETIOLOGY:  General  appearance:  This is a middle-aged overweight, medium height black female in  no apparent respiratory distress.  Vitals are:  Blood pressure 159/76, pulse  74, respirations 20, temperature 97.9.  Sclerae were white.  Lungs were  clear.  Heart revealed audible S1 & S2 without murmur.  Rhythm was regular.  Rate within normal limits.  Abdomen was obese with hypoactive bowel sounds.  Abdomen was positive for old, healed right lower quadrant and healed old mid  hypogastric surgery scars.  Abdomen was soft.  Abdomen was negative for  palpable masses or organomegaly.  Abdominal exam demonstrated mid epigastric  tenderness on palpation.  Rectally, the patient had no external lesions.  Digital exam demonstrated no rectal vault masses.  Stool was guaiac  negative.  The patient was neurologically intact.  Significant labs on  admission were as follows:  White count 6.8, hemoglobin 11.2, hematocrit  34.8, platelets 265,000.  Sodium 137, potassium 3.4, chloride 103, CO2 27,  glucose 85, BUN 9, creatinine 0.8.  Serum amylase  37, serum lipase 22, total  CPK 125.  CK-MB 2.9, troponin-I 0.01 and urinalysis specific gravity of  1.020, pH 6, glucose greater than 1000 mg/dL, and no bilirubin, no ketones,  moderate amount of blood, protein greater than 300 mg/dL, nitrite negative,  0-2 wbc's, and 3-6 rbc's.  The patient was treated with IV fluid using  normal saline at 50 mL per hour, Protonix 40 mg IV daily, Carafate liquid 1  g p.o. one hour before meals and bedtime, ultrasound of abdomen, analgesia  for pain, scheduling for diagnostic EGD by Dr. Maggie Schwalbe, and plain x-ray of  abdomen.  X-ray of abdomen on February 19, 2004 is read as postoperative  change in the abdomen with possible constipation.  Moreover, radiology saw  no evidence of bowel obstruction.  Stable chest with cardiomegaly and aortic  tortuosity as read by Dr. Rayetta Humphrey.  There was a moderate amount of  stool present  throughout the colon as read by Dr. Rayetta Humphrey.  There was  no suspicious calcification.  Ultrasound of the abdomen on February 19, 2004  was read as no acute or significant findings demonstrated.  Mild permanence  of the pancreatic head was noted without significant dilatation or evidence  of focal pancreatic lesions read by Dr. Rayetta Humphrey.  The patient  underwent a diagnostic EGD by Dr. Maggie Schwalbe on February 20, 2004 without  complication.  No abnormality was found.  The patient was treated with  GoLYTELY for constipation on February 20, 2004.  She experienced significant  bowel movement.  The patient felt better on February 21, 2004 without  complete resolution of her abdominal pain.  She was started on Bentyl  (antispasmodic).  She will be reevaluated as an outpatient.  Also, she will  undergo diagnostic repeat colonoscopy due to a history of colon cancer.  She  was alert and oriented to person, place, and time.   Problem 2.  HYPERTENSION:  Blood pressure was treated with antihypertensive  medication and low sodium diet.  Blood pressure on admission was 159/76 with  a pulse of 74.  Lungs were clear.  Heart revealed audible S1 & S2 without  murmur.  The rhythm was regular and rate within normal limits.  EKG on  admission was read as normal sinus rhythm with a rate of 75, early  transition, consider left atrial enlargement, and probable left ventricular  hypertrophy with ST-T abnormalities as read by Dr. Kari Baars.  The  patient did not complain of chest pain, shortness of breath, dizziness, or  weakness throughout this hospitalization.  Examination of extremities  demonstrates no edema.  The patient's blood pressure on the morning of  discharge was 114/52 with a pulse of 60.   Problem 3.  HYPERLIPIDEMIA:  The patient was treated in addition with a low  sodium diet and with a low cholesterol diet, and moreover, she was continued on Lescol at 80 mg p.o. every bedtime.  Fasting  lipid profile during this  hospitalization demonstrated the following:  On February 20, 2004 at 0510  total cholesterol 183 mg/dL, triglycerides 161 mg/dL, HDL 44 mg/dL, LDL 096  mg/dL.  The patient had not complained of muscle ache during this  hospitalization.  Liver function revealed the following:  Total protein 7.1,  albumin 3.6, AST 16, ALT 12, ALP 44, total bilirubin 0.6.   Problem 4.  OSTEOARTHRITIS:  The patient has a history of multiple joint  pains and stiffness.  She will be advised to continue  Tylenol arthritic  medication as needed for joint pain.  Examination of knees demonstrated  bilateral stiffness without redness or hotness.   Problem 5.  MICROSCOPIC HEMATURIA:  Urinalysis during this hospitalization  demonstrated moderate hemoglobin in urine.  Urine cultures demonstrated 8000  colonies per mL without any significant growth.  The patient denied a  history of gross hematuria, dysuria, or change in urinary frequency.   Problem 6.  GLUCOSURIA:  A urinalysis revealed greater than 1000 mg/dL.  Hemoglobin A1C was 5.8% (normal 4.6 to 6.1).  Serum glucose 85.  The patient  will have a repeat urinalysis as an outpatient.  Urine will be checked for  glucose.   Problem 7.  PROTEINURIA:  Urinalysis done during this hospitalization  demonstrated greater than 300 mg/dL.  She will have a 24-hour urine protein  as an outpatient.  Moreover, based on the value of the 24-hour urine  protein, she will be referred to nephrology.  Ultrasound of abdomen during  this hospitalization revealed both kidneys appeared normal measuring 11.4 cm  on the right and 10 cm on the left as read by Dr. Rayetta Humphrey.  The  patient was discharged to her home on February 22, 2004.   DIET:  Low salt, low cholesterol.   ACTIVITY:  No restriction.   MEDICATIONS:  1.  Bentyl 20 mg p.o. q.i.d.  2.  Tylox one tablet p.o. q.4 h. as needed for pain.  3.  Cozaar 100 mg p.o. daily.  4.  Clorpres 0.1/12.5 mg one  tablet p.o. every day.  5.  Verelan P.M. 300 mg p.o. every bedtime.  6.  Lescol XL 80 mg p.o. every bedtime.   FAMILY HISTORY:  With Dr. Maggie Schwalbe in 1 week.   FINAL PRIMARY DIAGNOSES:  1.  Abdominal pain of uncertain etiology.  2.  Constipation.   SECONDARY DIAGNOSES:  1.  Hypertension.  2.  Hyperlipidemia.  3.  Osteoarthritis.  4.  Glucosuria.  5.  Proteinuria.      GKH/MEDQ  D:  02/25/2004  T:  02/25/2004  Job:  [id number]

## 2010-09-10 NOTE — Op Note (Signed)
NAMETASHEIKA, KITZMILLER                 ACCOUNT NO.:  [id number]   MEDICAL RECORD NO.:  [id number]          PATIENT TYPE:  OIB   LOCATION:  3028                         FACILITY:  [facility]   PHYSICIAN:  Reinaldo Meeker, M.D. 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   PROCEDURE:  C3-4, C4-5 anterior cervical diskectomy with bone bank  fusion followed by Accufix anterior cervical plating.   SURGEON:  Reinaldo Meeker, M.D.   ASSISTANT:  Marikay Alar, M.D.   PROCEDURE IN DETAIL:  After being placed in the supine position in 5  pounds halter traction, the patient's neck was prepped and draped in  usual sterile fashion.  Localizing fluoroscopy was used prior to  incision to identify the appropriate level.   A transverse incision was then made in the right anterior neck starting  at the midline and headed towards the medial aspect of the  sternocleidomastoid muscle.  The platysma muscle was then incised  transversely.  The natural fascial plane between the strap muscles  medially and sternocleidomastoid laterally was identified and followed  down to the anterior aspect of the cervical spine.  The longus coli  muscles were identified and split in the midline and stripped away  bilaterally with the unipolar coagulation and Kitner resector.  A self-  retaining retractor was placed for exposure, and x-ray showed approach  at the appropriate level.  Using a 15 blade, the __________ disk at C3-4  and C4-5 was incised.  Using pituitary rongeurs and curettes,  approximately ___________ of disk material was removed at both levels.  The high-speed drill was used to widen both interspaces.  The microscope  was draped, brought onto the field, and used for the  remainder of the  case.  Starting at C3-4, the remainder of disk material and the  posterior longitudinal ligament was removed.  Bony overgrowth and  herniated disk material were removed to complete the decompression of  the spinal dura.  Proximal foraminal decompression was carried out  bilaterally.  Inspection for any evidence of residual compression showed  none to be still present.  Attention was then turned to C4-5.  Once  again, the remainder of the disk material and the posterior longitudinal  ligament was removed.  Ligament was then again removed in a piecemeal  fashion to decompress the underlying spinal dura generously and  bilaterally all the way across until the proximal foramen could be  identified.  At this time, inspection was carried out in all directions  at both levels for any evidence of residual compression, and none could  be identified.  Large amounts of irrigation were carried out.  Any  bleeding was controlled with bipolar coagulation and Gelfoam.  Measurements were taken, and 8-mm bone bank plugs were reconstituted and  placed in C3-4 and a 7-mm plug at C4-5.  Fluoroscopy  showed them to be  in good position.  Irrigation was then carried out.  Any bleeding was  controlled with bipolar coagulation.  An appropriate length Accufix  anterior cervical plate was then chosen.  Under fluoroscopic guidance,  drill holes were placed followed by placement of 13-mm screws x6.  The  locking mechanism was secured at all locations, and final fluoroscopy  showed the plate, screws, and plugs to all be in good position.  At this  time, large amounts of irrigation were carried out.  Any bleeding was  controlled with bipolar coagulation.  The wound was then closed  using interrupted Vicryl on the platysma muscle and inverted 5-0 PDS on  the subcuticular layer and Steri-Strips on the skin.  Sterile dressings  and soft collar were then applied.  The patient was extubated and taken  to  the recovery room in stable condition.           ______________________________  Reinaldo Meeker, M.D.     ROK/MEDQ  D:  04/13/2006  T:  04/14/2006  Job:  [id number]

## 2010-09-10 NOTE — Discharge Summary (Signed)
NAME:  [name], [name]                           ACCOUNT NO.:  [id number]   MEDICAL RECORD NO.:  [id number]                   PATIENT TYPE:  OIB   LOCATION:  6525                                 FACILITY:  [facility]   PHYSICIAN:  Dani Gobble, MD                    DATE OF BIRTH:  Aug 23, 1940   DATE OF ADMISSION:  10/20/2003  DATE OF DISCHARGE:  10/21/2003                                 DISCHARGE SUMMARY   HISTORY OF PRESENT ILLNESS:  [name] is a 70 year old, African American,  female patient of Dani Gobble, M.D., who came into the hospital for  elective cardiac catheterization.  She has been having episodes of chest  pain.  She also had a witnessed loss of consciousness after getting  emotionally upset, associated with chest tightness and palpitations.  She  did undergo a Cardiolite test on October 08, 2003, which showed no significant  ischemia.  Her EF was 74%.   HOSPITAL COURSE:  Because of her significant symptoms, it was decided for  her to undergo cardiac catheterization.  This was performed by Darlin Priestly, M.D., on October 20, 2003.  She had some 20% and 40% blockages in her  distal RCA.  She had a 50% diagonal 1, an 80% distal LAD, a 50% OM2, and a  40% circumflex.  She underwent PCI of her LAD distal, reduced from 80% to  50%.  She did receive Angiomax.  She was started no aspirin and Plavix.  On  the morning of October 21, 2003, she was seen by Dr. Allyson Sabal and considered  stable to be discharged home.  She was having palpitations and at the time  of her palpitations it shows that she goes into an accelerated junctional  rate of 67.  Her blood pressure is 112/54, heart rate is 72, respirations  are 18, and temperature is 97.3 degrees.  On the morning October 21, 2003, her  hemoglobin was 10.3, hematocrit 30.3, platelets 212, WBC 5.4, sodium 138,  potassium 3.6, BUN 6, creatinine 0.8, and glucose 120.  The CK-MB was  116/1.5 and troponin was 0.03.  It was decided to decrease her  verapamil.   DISCHARGE MEDICATIONS:  1. Enteric-coated aspirin 81 mg one tablet per day.  2. Plavix 75 mg one time per day.  3. Cozaar 100 mg one time per day.  4. Verapamil SR 240 mg at bedtime.  5. Lescol XL 80 mg one time per day at bedtime.  6. Protonix 40 mg once per day.   ACTIVITY:  She should do no strenuous activity, lifting, pushing, or pulling  x 4 days.  No driving x 2 days.   DIET:  She should be on a low-saturated fat diet.   FOLLOWUP:  She will have a 2-D echocardiogram done at the [facility] branch  of [facility] on the day of her discharge, October 21, 2003, at 12 p.m.  She has a scheduled appointment now with Dr. Domingo Sep on  October 30, 2003, at 1:45 p.m. in [place], [place], for followup  evaluation and treatment.   DISCHARGE DIAGNOSES:  1. Unstable angina pectoris, status post catheterization with positive     coronary artery disease, 80% distal left anterior descending.  She     underwent PCI, reduced to 50%.  2. Residual minimal to moderate stenosis with 40% lesions in multiple     coronary arteries.  3. Normal ejection fraction.  4. Accelerated junctional rhythm.  5. Hypertension.  6. Hyperlipidemia.  7. Obesity.  8. Anemia.      Lezlie Octave, N.P.                        Dani Gobble, MD    BB/MEDQ  D:  10/21/2003  T:  10/21/2003  Job:  [id number]

## 2010-09-10 NOTE — H&P (Signed)
NAME:  [name], [name] NO.:  [id number]   MEDICAL RECORD NO.:  [id number]                   PATIENT TYPE:  OIB   LOCATION:  6525                                 FACILITY:  [facility]   PHYSICIAN:  Darlin Priestly, M.D.             DATE OF BIRTH:  1940-09-26   DATE OF ADMISSION:  10/20/2003  DATE OF DISCHARGE:                                HISTORY & PHYSICAL   I just dictated a discharge dictation on [name], Medical Record Number  [id number].  The dictation number was [id number].  I would like a carbon copy of  that dictation sent to Dr. Mirna Mires at [address],  [place], [place] [address].      Lezlie Octave, N.P.                        Darlin Priestly, M.D.    BB/MEDQ  D:  10/21/2003  T:  10/21/2003  Job:  [id number]

## 2010-09-16 ENCOUNTER — Encounter (INDEPENDENT_AMBULATORY_CARE_PROVIDER_SITE_OTHER): Payer: Medicare HMO

## 2010-09-16 ENCOUNTER — Ambulatory Visit (INDEPENDENT_AMBULATORY_CARE_PROVIDER_SITE_OTHER): Payer: Medicare HMO | Admitting: Vascular Surgery

## 2010-09-16 DIAGNOSIS — N189 Chronic kidney disease, unspecified: Secondary | ICD-10-CM

## 2010-09-16 DIAGNOSIS — N186 End stage renal disease: Secondary | ICD-10-CM

## 2010-09-16 DIAGNOSIS — Z01810 Encounter for preprocedural cardiovascular examination: Secondary | ICD-10-CM

## 2010-09-17 ENCOUNTER — Other Ambulatory Visit (HOSPITAL_COMMUNITY): Payer: Self-pay | Admitting: Oncology

## 2010-09-17 ENCOUNTER — Encounter (HOSPITAL_COMMUNITY): Payer: Medicare HMO

## 2010-09-17 DIAGNOSIS — C9000 Multiple myeloma not having achieved remission: Secondary | ICD-10-CM

## 2010-09-17 DIAGNOSIS — E538 Deficiency of other specified B group vitamins: Secondary | ICD-10-CM

## 2010-09-17 DIAGNOSIS — E876 Hypokalemia: Secondary | ICD-10-CM

## 2010-09-17 DIAGNOSIS — Z5111 Encounter for antineoplastic chemotherapy: Secondary | ICD-10-CM

## 2010-09-17 LAB — COMPREHENSIVE METABOLIC PANEL
AST: 23 U/L (ref 0–37)
Albumin: 3.5 g/dL (ref 3.5–5.2)
Alkaline Phosphatase: 34 U/L — ABNORMAL LOW (ref 39–117)
Chloride: 110 mEq/L (ref 96–112)
Creatinine, Ser: 2.22 mg/dL — ABNORMAL HIGH (ref 0.4–1.2)
GFR calc Af Amer: 27 mL/min — ABNORMAL LOW (ref >60)
Potassium: 2.7 mEq/L — CL (ref 3.5–5.1)
Total Bilirubin: 0.4 mg/dL (ref 0.3–1.2)
Total Protein: 6.7 g/dL (ref 6.0–8.3)

## 2010-09-17 LAB — CBC
Platelets: 143 10*3/uL — ABNORMAL LOW (ref 150–400)
RDW: 16 % — ABNORMAL HIGH (ref 11.5–15.5)
WBC: 8.9 10*3/uL (ref 4.0–10.5)

## 2010-09-17 LAB — DIFFERENTIAL
Basophils Absolute: 0 10*3/uL (ref 0.0–0.1)
Lymphocytes Relative: 13 % (ref 12–46)
Neutro Abs: 7.1 10*3/uL (ref 1.7–7.7)

## 2010-09-17 NOTE — Assessment & Plan Note (Signed)
OFFICE VISIT  Diane, Ray DOB:  Aug 15, 1940                                       09/16/2010 ZOXWR#:[id number]  CHIEF COMPLAINT:  Needs hemodialysis access.  HISTORY OF PRESENT ILLNESS:  The patient is a 70 year old female referred by Dr. Kristian Covey for placement of a long-term hemodialysis access.  The patient currently is not on hemodialysis.  Chronic medical problems include hypertension, anemia and diabetes.  The patient also has multiple myeloma and is currently on chemotherapy via a left Port-A- Cath site.  She receives chemotherapy every 2 weeks.  She is left- handed.  Review of lab work that was submitted during her office visit today shows a recent hemoglobin of 10 and platelet count of 166.  PAST MEDICAL HISTORY:  As listed above.  Additionally, she has elevated cholesterol.  PAST SURGICAL HISTORY:  Colon resection, hysterectomy, left knee arthroscopy, left heel spur.  SOCIAL HISTORY:  She has 4 children.  She is a former smoker, quit in 1992.  She does not consume alcohol regularly.  FAMILY HISTORY:  Her mother and brother both had vascular disease at age less than 20.  REVIEW OF SYSTEMS:  She has had some recent weight loss and loss of appetite.  She is 5 feet 5, 204 pounds.  Vascular:  She has some occasional pain in the legs when walking.  Musculoskeletal:  She has multiple joint arthritis.  All other systems are negative.  MEDICATIONS:  Acyclovir 200 mg twice a day, amlodipine 5 mg once a day, Diovan 320 mg once a day, spirolactone 50 mg twice a day, Bystolic 5 mg once daily, Crestor 20 mg once a day, triamterene hydrochlorothiazide 37.5/25 one tablet Monday, Wednesday and Friday, glipizide 5 mg once a day, omeprazole 20 mg twice a day, sorbitol 70% solution 30 mL daily.  ALLERGIES:  She has no known drug allergies.  PHYSICAL EXAMINATION:  Blood pressure 92/61 in the right arm, heart rate 86 and regular.  Temperature is 98.  HEENT:   Unremarkable.  NECK:  Has 2+ carotid pulses without bruit.  Chest:  Clear to auscultation. Cardiac:  Regular rate and rhythm without murmur.  Abdomen:  Soft, nontender, nondistended.  No masses.  Musculoskeletal:  No major skeletal deformities.  Neurologic:  Symmetric upper extremity and lower extremity motor strength which is 5/5.  Skin:  No open ulcers or rashes. Peripheral vascular:  She has 2+ brachial and radial pulse in the right and left upper extremities.  She had a vein mapping ultrasound today which I reviewed and interpreted.  This shows a cephalic vein that is between 30 mm and 38 mm in diameter in the right upper arm.  The forearm cephalic vein is quite small.  The right basilic vein is also greater than 4 mm in diameter. The left upper extremity was not evaluated due to the fact that she has a Port-A-Cath on that side.  In summary, I believe the best option for the patient would be placement of a right brachiocephalic AV fistula.  The risks, benefits, possible complications and procedure details including but not limited to bleeding, infection, ischemic steal, non maturation of fistula were explained to the patient and her daughter today.  We will schedule her fistula within the next few weeks.    Janetta Hora. Fields, MD Electronically Signed  CEF/MEDQ  D:  09/16/2010  T:  09/17/2010  Job:  [id number]  cc:   Jorja Loa, M.D.

## 2010-09-22 LAB — PROTEIN ELECTROPH W RFLX QUANT IMMUNOGLOBULINS
Alpha-1-Globulin: 7.1 % — ABNORMAL HIGH (ref 2.9–4.9)
Alpha-2-Globulin: 15.9 % — ABNORMAL HIGH (ref 7.1–11.8)
Beta Globulin: 4.7 % (ref 4.7–7.2)
M-Spike, %: 0.59 g/dL
Total Protein ELP: 6.6 g/dL (ref 6.0–8.3)

## 2010-09-23 LAB — IMMUNOFIXATION ELECTROPHORESIS: IgG (Immunoglobin G), Serum: 1030 mg/dL (ref 690–1700)

## 2010-09-24 ENCOUNTER — Ambulatory Visit (HOSPITAL_COMMUNITY): Payer: Medicare HMO | Admitting: Oncology

## 2010-09-24 LAB — KAPPA/LAMBDA LIGHT CHAINS
Kappa free light chain: 2.73 mg/dL — ABNORMAL HIGH (ref 0.33–1.94)
Kappa, lambda light chain ratio: 0.38 (ref 0.26–1.65)
Lambda free light chains: 7.16 mg/dL — ABNORMAL HIGH (ref 0.57–2.63)

## 2010-09-24 NOTE — Procedures (Unsigned)
CEPHALIC VEIN MAPPING  INDICATION:  Preop vein mapping for fistula placement.  HISTORY: End-stage renal disease.  EXAM: The right cephalic vein is compressible.  Diameter measurements range from 0.38 to 0.23 cm.  The right basilic vein is compressible.  Diameter measurements range from 0.48 to 0.31 cm.  The left cephalic vein is not evaluated.  The left basilic vein is not evaluated.  See attached worksheet for all measurements.  IMPRESSION:  Patent right cephalic and basilic veins with diameter measurements as described above.  ___________________________________________ Janetta Hora. Fields, MD  LT/MEDQ  D:  09/16/2010  T:  09/16/2010  Job:  [id number]

## 2010-09-29 ENCOUNTER — Ambulatory Visit (HOSPITAL_COMMUNITY): Admission: RE | Admit: 2010-09-29 | Payer: Medicare HMO | Source: Ambulatory Visit | Admitting: Vascular Surgery

## 2010-09-30 LAB — IGG, IGA, IGM: IgG (Immunoglobin G), Serum: 2540 mg/dL — ABNORMAL HIGH (ref 690–1700)

## 2010-10-01 ENCOUNTER — Other Ambulatory Visit (HOSPITAL_COMMUNITY): Payer: Self-pay | Admitting: Oncology

## 2010-10-01 ENCOUNTER — Encounter (HOSPITAL_COMMUNITY): Payer: Medicare HMO | Attending: Oncology

## 2010-10-01 DIAGNOSIS — C9000 Multiple myeloma not having achieved remission: Secondary | ICD-10-CM

## 2010-10-01 DIAGNOSIS — Z79899 Other long term (current) drug therapy: Secondary | ICD-10-CM | POA: Insufficient documentation

## 2010-10-01 DIAGNOSIS — D649 Anemia, unspecified: Secondary | ICD-10-CM

## 2010-10-01 LAB — CBC
MCH: 31.2 pg (ref 26.0–34.0)
MCV: 92.1 fL (ref 78.0–100.0)
Platelets: 173 10*3/uL (ref 150–400)
RBC: 2.15 MIL/uL — ABNORMAL LOW (ref 3.87–5.11)

## 2010-10-01 LAB — COMPREHENSIVE METABOLIC PANEL
CO2: 20 mEq/L (ref 19–32)
Calcium: 9.4 mg/dL (ref 8.4–10.5)
Creatinine, Ser: 1.55 mg/dL — ABNORMAL HIGH (ref 0.4–1.2)
GFR calc Af Amer: 40 mL/min — ABNORMAL LOW (ref >60)
GFR calc non Af Amer: 33 mL/min — ABNORMAL LOW (ref >60)
Glucose, Bld: 115 mg/dL — ABNORMAL HIGH (ref 70–99)
Sodium: 142 mEq/L (ref 135–145)
Total Protein: 6.6 g/dL (ref 6.0–8.3)

## 2010-10-01 LAB — DIFFERENTIAL
Eosinophils Absolute: 0 10*3/uL (ref 0.0–0.7)
Eosinophils Relative: 3 % (ref 0–5)
Lymphs Abs: 0.7 10*3/uL (ref 0.7–4.0)
Monocytes Absolute: 0.4 10*3/uL (ref 0.1–1.0)

## 2010-10-02 LAB — CROSSMATCH
ABO/RH(D): A POS
Antibody Screen: NEGATIVE
Unit division: 0

## 2010-10-05 LAB — PROTEIN ELECTROPH W RFLX QUANT IMMUNOGLOBULINS
Albumin ELP: 53.6 % — ABNORMAL LOW (ref 55.8–66.1)
Alpha-1-Globulin: 6.7 % — ABNORMAL HIGH (ref 2.9–4.9)
Alpha-2-Globulin: 15.9 % — ABNORMAL HIGH (ref 7.1–11.8)
Beta 2: 5.3 % (ref 3.2–6.5)
Gamma Globulin: 13.7 % (ref 11.1–18.8)

## 2010-10-06 LAB — IGG, IGA, IGM
IgA: 48 mg/dL — ABNORMAL LOW (ref 69–380)
IgM, Serum: 20 mg/dL — ABNORMAL LOW (ref 52–322)

## 2010-10-07 ENCOUNTER — Other Ambulatory Visit (HOSPITAL_COMMUNITY): Payer: Self-pay | Admitting: Oncology

## 2010-10-07 ENCOUNTER — Encounter (HOSPITAL_COMMUNITY): Payer: Medicare HMO

## 2010-10-07 DIAGNOSIS — C9000 Multiple myeloma not having achieved remission: Secondary | ICD-10-CM

## 2010-10-07 LAB — DIFFERENTIAL
Basophils Absolute: 0 10*3/uL (ref 0.0–0.1)
Lymphocytes Relative: 14 % (ref 12–46)
Lymphs Abs: 0.9 10*3/uL (ref 0.7–4.0)
Monocytes Absolute: 0.7 10*3/uL (ref 0.1–1.0)
Neutro Abs: 4.9 10*3/uL (ref 1.7–7.7)

## 2010-10-07 LAB — CBC
HCT: 28.7 % — ABNORMAL LOW (ref 36.0–46.0)
Platelets: 191 10*3/uL (ref 150–400)
RBC: 3.13 MIL/uL — ABNORMAL LOW (ref 3.87–5.11)
RDW: 16 % — ABNORMAL HIGH (ref 11.5–15.5)
WBC: 6.6 10*3/uL (ref 4.0–10.5)

## 2010-10-07 LAB — IMMUNOFIXATION ELECTROPHORESIS
IgA: 47 mg/dL — ABNORMAL LOW (ref 68–380)
IgM, Serum: 20 mg/dL — ABNORMAL LOW (ref 52–322)
Total Protein ELP: 6.2 g/dL (ref 6.0–8.3)

## 2010-10-08 ENCOUNTER — Encounter (HOSPITAL_COMMUNITY): Payer: Medicare HMO

## 2010-10-08 DIAGNOSIS — C9000 Multiple myeloma not having achieved remission: Secondary | ICD-10-CM

## 2010-10-08 DIAGNOSIS — Z5111 Encounter for antineoplastic chemotherapy: Secondary | ICD-10-CM

## 2010-10-11 ENCOUNTER — Ambulatory Visit (HOSPITAL_COMMUNITY): Payer: Medicare HMO | Admitting: Oncology

## 2010-10-18 ENCOUNTER — Other Ambulatory Visit (HOSPITAL_COMMUNITY): Payer: Self-pay | Admitting: Oncology

## 2010-10-18 ENCOUNTER — Encounter (HOSPITAL_COMMUNITY): Payer: Medicare HMO

## 2010-10-18 DIAGNOSIS — C9000 Multiple myeloma not having achieved remission: Secondary | ICD-10-CM

## 2010-10-18 LAB — URINALYSIS, ROUTINE W REFLEX MICROSCOPIC
Bilirubin Urine: NEGATIVE
Protein, ur: 100 mg/dL — AB
Urobilinogen, UA: 0.2 mg/dL (ref 0.0–1.0)

## 2010-10-18 LAB — URINE MICROSCOPIC-ADD ON

## 2010-10-21 LAB — URINE CULTURE
Culture  Setup Time: 201206251840
Special Requests: POSITIVE

## 2010-10-22 ENCOUNTER — Other Ambulatory Visit (HOSPITAL_COMMUNITY): Payer: Self-pay | Admitting: Oncology

## 2010-10-22 ENCOUNTER — Encounter (HOSPITAL_COMMUNITY): Payer: Medicare HMO

## 2010-10-22 DIAGNOSIS — C9000 Multiple myeloma not having achieved remission: Secondary | ICD-10-CM

## 2010-10-22 DIAGNOSIS — D638 Anemia in other chronic diseases classified elsewhere: Secondary | ICD-10-CM

## 2010-10-22 DIAGNOSIS — Z452 Encounter for adjustment and management of vascular access device: Secondary | ICD-10-CM

## 2010-10-22 DIAGNOSIS — E119 Type 2 diabetes mellitus without complications: Secondary | ICD-10-CM

## 2010-10-22 LAB — COMPREHENSIVE METABOLIC PANEL
Albumin: 3.9 g/dL (ref 3.5–5.2)
BUN: 21 mg/dL (ref 6–23)
Chloride: 111 mEq/L (ref 96–112)
Creatinine, Ser: 2.43 mg/dL — ABNORMAL HIGH (ref 0.50–1.10)
GFR calc non Af Amer: 20 mL/min — ABNORMAL LOW (ref >60)
Total Bilirubin: 0.4 mg/dL (ref 0.3–1.2)

## 2010-10-22 LAB — DIFFERENTIAL
Eosinophils Relative: 1 % (ref 0–5)
Lymphocytes Relative: 33 % (ref 12–46)
Lymphs Abs: 1.1 10*3/uL (ref 0.7–4.0)
Monocytes Absolute: 0.8 10*3/uL (ref 0.1–1.0)
Monocytes Relative: 24 % — ABNORMAL HIGH (ref 3–12)

## 2010-10-22 LAB — CBC
HCT: 26.7 % — ABNORMAL LOW (ref 36.0–46.0)
MCH: 30.9 pg (ref 26.0–34.0)
MCHC: 33.3 g/dL (ref 30.0–36.0)
MCV: 92.7 fL (ref 78.0–100.0)
RDW: 15.2 % (ref 11.5–15.5)
WBC: 3.3 10*3/uL — ABNORMAL LOW (ref 4.0–10.5)

## 2010-10-23 ENCOUNTER — Encounter (HOSPITAL_COMMUNITY): Payer: Medicare HMO

## 2010-10-23 DIAGNOSIS — C9000 Multiple myeloma not having achieved remission: Secondary | ICD-10-CM

## 2010-10-23 DIAGNOSIS — Z452 Encounter for adjustment and management of vascular access device: Secondary | ICD-10-CM

## 2010-10-25 ENCOUNTER — Encounter (HOSPITAL_COMMUNITY): Payer: Medicare HMO

## 2010-10-25 ENCOUNTER — Other Ambulatory Visit (HOSPITAL_COMMUNITY): Payer: Self-pay | Admitting: Oncology

## 2010-10-25 ENCOUNTER — Ambulatory Visit (HOSPITAL_COMMUNITY)
Admission: RE | Admit: 2010-10-25 | Discharge: 2010-10-25 | Disposition: A | Discharge status: Home / Self Care | Payer: Medicare HMO | Source: Ambulatory Visit | Attending: Oncology | Admitting: Oncology

## 2010-10-25 DIAGNOSIS — T80212A Local infection due to central venous catheter, initial encounter: Secondary | ICD-10-CM

## 2010-10-25 DIAGNOSIS — Z79899 Other long term (current) drug therapy: Secondary | ICD-10-CM | POA: Insufficient documentation

## 2010-10-25 DIAGNOSIS — Z452 Encounter for adjustment and management of vascular access device: Secondary | ICD-10-CM

## 2010-10-25 DIAGNOSIS — C9000 Multiple myeloma not having achieved remission: Secondary | ICD-10-CM | POA: Insufficient documentation

## 2010-10-26 ENCOUNTER — Encounter (HOSPITAL_COMMUNITY): Payer: Medicare HMO

## 2010-10-26 ENCOUNTER — Inpatient Hospital Stay (HOSPITAL_COMMUNITY)
Admission: EM | Admit: 2010-10-26 | Discharge: 2010-10-29 | DRG: 638 | Disposition: A | Discharge status: Home / Self Care | Payer: Medicare HMO | Attending: Family Medicine | Admitting: Family Medicine

## 2010-10-26 DIAGNOSIS — T383X5A Adverse effect of insulin and oral hypoglycemic [antidiabetic] drugs, initial encounter: Secondary | ICD-10-CM | POA: Diagnosis present

## 2010-10-26 DIAGNOSIS — D630 Anemia in neoplastic disease: Secondary | ICD-10-CM | POA: Diagnosis present

## 2010-10-26 DIAGNOSIS — I129 Hypertensive chronic kidney disease with stage 1 through stage 4 chronic kidney disease, or unspecified chronic kidney disease: Secondary | ICD-10-CM | POA: Diagnosis present

## 2010-10-26 DIAGNOSIS — E1169 Type 2 diabetes mellitus with other specified complication: Principal | ICD-10-CM | POA: Diagnosis present

## 2010-10-26 DIAGNOSIS — N189 Chronic kidney disease, unspecified: Secondary | ICD-10-CM | POA: Diagnosis present

## 2010-10-26 DIAGNOSIS — C9000 Multiple myeloma not having achieved remission: Secondary | ICD-10-CM | POA: Diagnosis present

## 2010-10-26 DIAGNOSIS — Z452 Encounter for adjustment and management of vascular access device: Secondary | ICD-10-CM

## 2010-10-26 DIAGNOSIS — E785 Hyperlipidemia, unspecified: Secondary | ICD-10-CM | POA: Diagnosis present

## 2010-10-26 DIAGNOSIS — B961 Klebsiella pneumoniae [K. pneumoniae] as the cause of diseases classified elsewhere: Secondary | ICD-10-CM | POA: Diagnosis present

## 2010-10-26 DIAGNOSIS — N390 Urinary tract infection, site not specified: Secondary | ICD-10-CM | POA: Diagnosis present

## 2010-10-26 LAB — COMPREHENSIVE METABOLIC PANEL
ALT: 13 U/L (ref 0–35)
AST: 17 U/L (ref 0–37)
Alkaline Phosphatase: 38 U/L — ABNORMAL LOW (ref 39–117)
CO2: 15 mEq/L — ABNORMAL LOW (ref 19–32)
Chloride: 110 mEq/L (ref 96–112)
GFR calc Af Amer: 22 mL/min — ABNORMAL LOW (ref >60)
GFR calc non Af Amer: 18 mL/min — ABNORMAL LOW (ref >60)
Glucose, Bld: 72 mg/dL (ref 70–99)
Potassium: 5.8 mEq/L — ABNORMAL HIGH (ref 3.5–5.1)
Sodium: 132 mEq/L — ABNORMAL LOW (ref 135–145)

## 2010-10-26 LAB — GLUCOSE, CAPILLARY
Glucose-Capillary: 160 mg/dL — ABNORMAL HIGH (ref 70–99)
Glucose-Capillary: 64 mg/dL — ABNORMAL LOW (ref 70–99)
Glucose-Capillary: 62 mg/dL — ABNORMAL LOW (ref 70–99)

## 2010-10-27 LAB — BASIC METABOLIC PANEL
BUN: 16 mg/dL (ref 6–23)
Chloride: 112 mEq/L (ref 96–112)
GFR calc Af Amer: 23 mL/min — ABNORMAL LOW (ref >60)
GFR calc non Af Amer: 19 mL/min — ABNORMAL LOW (ref >60)
Glucose, Bld: 88 mg/dL (ref 70–99)
Potassium: 5.5 mEq/L — ABNORMAL HIGH (ref 3.5–5.1)
Sodium: 135 mEq/L (ref 135–145)

## 2010-10-27 LAB — TSH: TSH: 1.379 u[IU]/mL (ref 0.350–4.500)

## 2010-10-27 LAB — GLUCOSE, CAPILLARY
Glucose-Capillary: 172 mg/dL — ABNORMAL HIGH (ref 70–99)
Glucose-Capillary: 179 mg/dL — ABNORMAL HIGH (ref 70–99)

## 2010-10-28 LAB — BASIC METABOLIC PANEL
BUN: 10 mg/dL (ref 6–23)
Calcium: 9 mg/dL (ref 8.4–10.5)
GFR calc Af Amer: 26 mL/min — ABNORMAL LOW (ref >60)
GFR calc non Af Amer: 22 mL/min — ABNORMAL LOW (ref >60)
Glucose, Bld: 153 mg/dL — ABNORMAL HIGH (ref 70–99)
Potassium: 4.1 mEq/L (ref 3.5–5.1)
Sodium: 136 mEq/L (ref 135–145)

## 2010-10-28 LAB — GLUCOSE, CAPILLARY: Glucose-Capillary: 162 mg/dL — ABNORMAL HIGH (ref 70–99)

## 2010-10-29 ENCOUNTER — Other Ambulatory Visit (HOSPITAL_COMMUNITY): Payer: Medicare HMO

## 2010-10-29 ENCOUNTER — Ambulatory Visit (HOSPITAL_COMMUNITY): Payer: Medicare HMO | Admitting: Oncology

## 2010-10-29 ENCOUNTER — Inpatient Hospital Stay (HOSPITAL_COMMUNITY): Payer: Medicare HMO

## 2010-10-29 LAB — BASIC METABOLIC PANEL
BUN: 10 mg/dL (ref 6–23)
Chloride: 110 mEq/L (ref 96–112)
Creatinine, Ser: 1.98 mg/dL — ABNORMAL HIGH (ref 0.50–1.10)
GFR calc Af Amer: 30 mL/min — ABNORMAL LOW (ref >60)
Glucose, Bld: 136 mg/dL — ABNORMAL HIGH (ref 70–99)
Potassium: 3.8 mEq/L (ref 3.5–5.1)

## 2010-11-02 NOTE — H&P (Signed)
[name] is an 70 y.o. female.   Chief Complaint: *Dysfunctional port** HPI: Had a port placed in the past by another surgeon, but now does not work.  Needs another port placed for chemotherapy for multiple myeloma.  No past medical history on file.  No past surgical history on file.  No family history on file. Social History:  does not have a smoking history on file. She does not have any smokeless tobacco history on file. Her alcohol and drug histories not on file.  Allergies:  Allergies  Allergen Reactions  . Simvastatin     REACTION: leg cramps    No current facility-administered medications on file as of .   Medications Prior to Admission  Medication Sig Dispense Refill  . nebivolol (BYSTOLIC) 5 MG tablet Take 5 mg by mouth daily.        Marland Kitchen omeprazole (PRILOSEC) 20 MG capsule Take 20 mg by mouth daily.        Marland Kitchen oxybutynin (DITROPAN) 5 MG tablet Take 5 mg by mouth 2 (two) times daily.        Marland Kitchen oxyCODONE-acetaminophen (PERCOCET) 5-325 MG per tablet Take 1 tablet by mouth every 6 (six) hours as needed. For pain       . polyethylene glycol (MIRALAX / GLYCOLAX) packet Take 17 g by mouth daily as needed. For constipation       . potassium chloride SA (K-DUR,KLOR-CON) 20 MEQ tablet Take 20 mEq by mouth 2 (two) times daily.        . rosuvastatin (CRESTOR) 20 MG tablet Take 20 mg by mouth at bedtime.        . triamterene-hydrochlorothiazide (DYAZIDE) 37.5-25 MG per capsule Take 1 capsule by mouth daily.        . valsartan (DIOVAN) 320 MG tablet Take 320 mg by mouth daily.          No results found for this or any previous visit (from the past 48 hour(s)). No results found.  Review of Systems  Constitutional: Positive for malaise/fatigue.  HENT: Negative.   Eyes: Negative.   Respiratory: Negative.   Cardiovascular: Negative.   Gastrointestinal: Negative.   Genitourinary: Negative.   Musculoskeletal: Negative.   Skin: Negative.   Neurological: Positive for weakness.    Endo/Heme/Allergies: Negative.   Psychiatric/Behavioral: Negative.     There were no vitals taken for this visit. Physical Exam  Constitutional:       Weak appearing.  HENT:  Head: Normocephalic and atraumatic.  Eyes: Conjunctivae and EOM are normal. Pupils are equal, round, and reactive to light.  Neck: Neck supple.  Cardiovascular: Normal rate, regular rhythm and normal heart sounds.   Respiratory: Effort normal and breath sounds normal.       Port in place in left.  GI: Soft.     Assessment/Plan Dysfuntional portacath/  Scheduled for portacath placement, portacath removal on 11/05/10.  Walker Sitar A 11/02/2010, 5:04 PM

## 2010-11-03 ENCOUNTER — Encounter (HOSPITAL_COMMUNITY)
Admission: RE | Admit: 2010-11-03 | Discharge: 2010-11-03 | Disposition: A | Discharge status: Home / Self Care | Payer: Medicare HMO | Source: Ambulatory Visit | Attending: Orthopedic Surgery | Admitting: Orthopedic Surgery

## 2010-11-03 ENCOUNTER — Encounter (HOSPITAL_COMMUNITY): Payer: Self-pay

## 2010-11-03 HISTORY — DX: Gastro-esophageal reflux disease without esophagitis: K21.9

## 2010-11-03 HISTORY — DX: Essential (primary) hypertension: I10

## 2010-11-03 HISTORY — DX: Stress incontinence (female) (male): N39.3

## 2010-11-03 NOTE — Patient Instructions (Addendum)
20 [name]  11/03/2010   Your procedure is scheduled on:  Friday, 713/12  Report to [facility] at 06:15 AM.  Call this number if you have problems the morning of surgery: [phone]   Remember:   Do not eat food:After Midnight.  Do not drink clear liquids: After Midnight.  Take these medicines the morning of surgery with A SIP OF WATER: BYSTOLIC, PRILOSEC, DIOVAN, AMLODIPINE, DYAZIDE   AND (ATIVAN IF NEEDED)   Do not wear jewelry, make-up or nail polish.  Do not bring valuables to the hospital.  Contacts, dentures or bridgework may not be worn into surgery.  Leave suitcase in the car. After surgery it may be brought to your room.  For patients admitted to the hospital, checkout time is 11:00 AM the day of discharge.   Patients discharged the day of surgery will not be allowed to drive home.  Name and phone number of your driver: [name], BROTHER  Special Instructions: CHG Shower Shower 2 days before surgery and 1 day before surgery with Hibiclens.   Please read over the following fact sheets that you were given: Pain Booklet, MRSA Information, Surgical Site Infection Prevention and Anesthesia Post-op Instructions     PATIENT INSTRUCTIONS POST-ANESTHESIA  IMMEDIATELY FOLLOWING SURGERY:  Do not drive or operate machinery for the first twenty four hours after surgery.  Do not make any important decisions for twenty four hours after surgery or while taking narcotic pain medications or sedatives.  If you develop intractable nausea and vomiting or a severe headache please notify your doctor immediately.  FOLLOW-UP:  Please make an appointment with your surgeon as instructed. You do not need to follow up with anesthesia unless specifically instructed to do so.  WOUND CARE INSTRUCTIONS (if applicable):  Keep a dry clean dressing on the anesthesia/puncture wound site if there is drainage.  Once the wound has quit draining you may leave it open to air.  Generally you should leave the  bandage intact for twenty four hours unless there is drainage.  If the epidural site drains for more than 36-48 hours please call the anesthesia department.  QUESTIONS?:  Please feel free to call your physician or the hospital operator if you have any questions, and they will be happy to assist you.     [facility] Anesthesia Department [address] [place] [phone]

## 2010-11-03 NOTE — Pre-Procedure Instructions (Signed)
Dr. Jayme Cloud says it is okay to use lab work from 10/29/10.

## 2010-11-05 ENCOUNTER — Encounter (HOSPITAL_COMMUNITY)
Admission: RE | Disposition: A | Discharge status: Home / Self Care | Payer: Self-pay | Source: Ambulatory Visit | Attending: General Surgery

## 2010-11-05 ENCOUNTER — Ambulatory Visit (HOSPITAL_COMMUNITY): Payer: Medicare HMO | Admitting: Anesthesiology

## 2010-11-05 ENCOUNTER — Ambulatory Visit (HOSPITAL_COMMUNITY): Payer: Medicare HMO

## 2010-11-05 ENCOUNTER — Encounter (HOSPITAL_COMMUNITY): Payer: Self-pay | Admitting: Anesthesiology

## 2010-11-05 ENCOUNTER — Other Ambulatory Visit (HOSPITAL_COMMUNITY): Payer: Self-pay | Admitting: Oncology

## 2010-11-05 ENCOUNTER — Encounter (HOSPITAL_COMMUNITY): Payer: Self-pay | Admitting: *Deleted

## 2010-11-05 ENCOUNTER — Encounter (HOSPITAL_COMMUNITY): Payer: Self-pay | Admitting: Oncology

## 2010-11-05 ENCOUNTER — Ambulatory Visit (HOSPITAL_COMMUNITY)
Admission: RE | Admit: 2010-11-05 | Discharge: 2010-11-05 | Disposition: A | Discharge status: Home / Self Care | Payer: Medicare HMO | Source: Ambulatory Visit | Attending: General Surgery | Admitting: General Surgery

## 2010-11-05 ENCOUNTER — Encounter (HOSPITAL_COMMUNITY): Payer: Medicare HMO | Attending: Oncology

## 2010-11-05 VITALS — BP 100/66 | HR 72 | Temp 98.7°F | Ht 65.0 in | Wt 192.0 lb

## 2010-11-05 DIAGNOSIS — Y849 Medical procedure, unspecified as the cause of abnormal reaction of the patient, or of later complication, without mention of misadventure at the time of the procedure: Secondary | ICD-10-CM | POA: Insufficient documentation

## 2010-11-05 DIAGNOSIS — Z452 Encounter for adjustment and management of vascular access device: Secondary | ICD-10-CM | POA: Insufficient documentation

## 2010-11-05 DIAGNOSIS — D649 Anemia, unspecified: Secondary | ICD-10-CM

## 2010-11-05 DIAGNOSIS — T82598A Other mechanical complication of other cardiac and vascular devices and implants, initial encounter: Secondary | ICD-10-CM | POA: Insufficient documentation

## 2010-11-05 DIAGNOSIS — C9000 Multiple myeloma not having achieved remission: Secondary | ICD-10-CM

## 2010-11-05 DIAGNOSIS — N390 Urinary tract infection, site not specified: Secondary | ICD-10-CM | POA: Insufficient documentation

## 2010-11-05 DIAGNOSIS — Z5111 Encounter for antineoplastic chemotherapy: Secondary | ICD-10-CM

## 2010-11-05 HISTORY — PX: PORTACATH PLACEMENT: SHX2246

## 2010-11-05 HISTORY — PX: PORT-A-CATH REMOVAL: SHX5289

## 2010-11-05 LAB — DIFFERENTIAL
Eosinophils Absolute: 0.1 10*3/uL (ref 0.0–0.7)
Eosinophils Relative: 3 % (ref 0–5)
Lymphocytes Relative: 13 % (ref 12–46)
Lymphs Abs: 0.7 10*3/uL (ref 0.7–4.0)
Monocytes Relative: 15 % — ABNORMAL HIGH (ref 3–12)

## 2010-11-05 LAB — CBC
Hemoglobin: 8.5 g/dL — ABNORMAL LOW (ref 12.0–15.0)
MCH: 31 pg (ref 26.0–34.0)
MCV: 94.5 fL (ref 78.0–100.0)
RBC: 2.74 MIL/uL — ABNORMAL LOW (ref 3.87–5.11)

## 2010-11-05 LAB — URINALYSIS, MICROSCOPIC ONLY
Bilirubin Urine: NEGATIVE
Glucose, UA: NEGATIVE mg/dL
Hgb urine dipstick: NEGATIVE
Specific Gravity, Urine: >1.03 — ABNORMAL HIGH (ref 1.005–1.030)
Urobilinogen, UA: 0.2 mg/dL (ref 0.0–1.0)

## 2010-11-05 SURGERY — INSERTION, TUNNELED CENTRAL VENOUS DEVICE, WITH PORT
Anesthesia: Monitor Anesthesia Care | Site: Chest | Laterality: Right | Wound class: Clean

## 2010-11-05 MED ORDER — SODIUM CHLORIDE 0.9 % IV SOLN
600.0000 mg/m2 | Freq: Once | INTRAVENOUS | Status: AC
  Administered 2010-11-05: 1200 mg via INTRAVENOUS
  Filled 2010-11-05: qty 60

## 2010-11-05 MED ORDER — SODIUM CHLORIDE 0.9 % IV SOLN: 24.0000 mg | Freq: Once | INTRAVENOUS | Status: DC

## 2010-11-05 MED ORDER — SODIUM CHLORIDE 0.9 % IV SOLN
Freq: Once | INTRAVENOUS | Status: AC
  Administered 2010-11-05: 12:00:00 via INTRAVENOUS

## 2010-11-05 MED ORDER — SODIUM CHLORIDE 0.9 % IJ SOLN
INTRAMUSCULAR | Status: AC
  Filled 2010-11-05: qty 10

## 2010-11-05 MED ORDER — SODIUM CHLORIDE 0.9 % IJ SOLN
INTRAMUSCULAR | Status: DC | PRN
  Administered 2010-11-05: 500 mL

## 2010-11-05 MED ORDER — LORAZEPAM 2 MG/ML IJ SOLN
INTRAMUSCULAR | Status: AC
  Administered 2010-11-05: 0.5 mg via INTRAVENOUS
  Filled 2010-11-05: qty 1

## 2010-11-05 MED ORDER — FENTANYL CITRATE 0.05 MG/ML IJ SOLN: 25.0000 ug | INTRAMUSCULAR | Status: DC | PRN

## 2010-11-05 MED ORDER — EPOETIN ALFA 40000 UNIT/ML IJ SOLN
40000.0000 [IU] | INTRAMUSCULAR | Status: DC
  Administered 2010-11-05: 40000 [IU] via SUBCUTANEOUS

## 2010-11-05 MED ORDER — HEPARIN SOD (PORK) LOCK FLUSH 100 UNIT/ML IV SOLN
500.0000 [IU] | Freq: Once | INTRAVENOUS | Status: AC | PRN
  Administered 2010-11-05: 16:00:00

## 2010-11-05 MED ORDER — FENTANYL CITRATE 0.05 MG/ML IJ SOLN
INTRAMUSCULAR | Status: AC
  Filled 2010-11-05: qty 2

## 2010-11-05 MED ORDER — CEFAZOLIN SODIUM 1-5 GM-% IV SOLN
INTRAVENOUS | Status: DC | PRN
  Administered 2010-11-05: 2 g via INTRAVENOUS

## 2010-11-05 MED ORDER — EPOETIN ALFA 40000 UNIT/ML IJ SOLN
INTRAMUSCULAR | Status: AC
  Administered 2010-11-05: 40000 [IU] via SUBCUTANEOUS
  Filled 2010-11-05: qty 1

## 2010-11-05 MED ORDER — DEXAMETHASONE SODIUM PHOSPHATE 4 MG/ML IJ SOLN: 16.0000 mg | Freq: Once | INTRAMUSCULAR | Status: DC

## 2010-11-05 MED ORDER — HEPARIN SOD (PORK) LOCK FLUSH 100 UNIT/ML IV SOLN
INTRAVENOUS | Status: AC
  Filled 2010-11-05: qty 5

## 2010-11-05 MED ORDER — MIDAZOLAM HCL 2 MG/2ML IJ SOLN
1.0000 mg | INTRAMUSCULAR | Status: DC | PRN
  Administered 2010-11-05: 2 mg via INTRAVENOUS

## 2010-11-05 MED ORDER — LIDOCAINE HCL (PF) 1 % IJ SOLN
INTRAMUSCULAR | Status: DC | PRN
  Administered 2010-11-05: 5 mL
  Administered 2010-11-05: 8 mL

## 2010-11-05 MED ORDER — LACTATED RINGERS IV SOLN
INTRAVENOUS | Status: DC
  Administered 2010-11-05: 07:00:00 via INTRAVENOUS

## 2010-11-05 MED ORDER — CEFAZOLIN SODIUM 1-5 GM-% IV SOLN
INTRAVENOUS | Status: AC
  Filled 2010-11-05: qty 50

## 2010-11-05 MED ORDER — LIDOCAINE HCL (PF) 1 % IJ SOLN
INTRAMUSCULAR | Status: AC
  Filled 2010-11-05: qty 30

## 2010-11-05 MED ORDER — ENOXAPARIN SODIUM 40 MG/0.4ML ~~LOC~~ SOLN
SUBCUTANEOUS | Status: AC
  Filled 2010-11-05: qty 0.4

## 2010-11-05 MED ORDER — HEPARIN SOD (PORK) LOCK FLUSH 100 UNIT/ML IV SOLN
INTRAVENOUS | Status: DC | PRN
  Administered 2010-11-05: 500 [IU] via INTRAVENOUS

## 2010-11-05 MED ORDER — PROPOFOL 10 MG/ML IV EMUL
INTRAVENOUS | Status: DC | PRN
  Administered 2010-11-05: 50 ug/kg/min via INTRAVENOUS

## 2010-11-05 MED ORDER — GLYCOPYRROLATE 0.2 MG/ML IJ SOLN
INTRAMUSCULAR | Status: AC
  Administered 2010-11-05: 0.2 mg via INTRAVENOUS
  Filled 2010-11-05: qty 1

## 2010-11-05 MED ORDER — CEFAZOLIN SODIUM-DEXTROSE 2-3 GM-% IV SOLR: 2.0000 g | INTRAVENOUS | Status: DC

## 2010-11-05 MED ORDER — LACTATED RINGERS IV SOLN
INTRAVENOUS | Status: DC | PRN
  Administered 2010-11-05: 07:00:00 via INTRAVENOUS

## 2010-11-05 MED ORDER — GLYCOPYRROLATE 0.2 MG/ML IJ SOLN
0.2000 mg | Freq: Once | INTRAMUSCULAR | Status: AC | PRN
  Administered 2010-11-05: 0.2 mg via INTRAVENOUS

## 2010-11-05 MED ORDER — SODIUM CHLORIDE 0.9 % IJ SOLN
10.0000 mL | INTRAMUSCULAR | Status: DC | PRN
  Administered 2010-11-05 (×2)

## 2010-11-05 MED ORDER — ACETAMINOPHEN 325 MG PO TABS
325.0000 mg | ORAL_TABLET | ORAL | Status: DC | PRN
  Filled 2010-11-05: qty 2

## 2010-11-05 MED ORDER — MIDAZOLAM HCL 5 MG/5ML IJ SOLN
INTRAMUSCULAR | Status: DC | PRN
  Administered 2010-11-05: 2 mg via INTRAVENOUS

## 2010-11-05 MED ORDER — ENOXAPARIN SODIUM 40 MG/0.4ML ~~LOC~~ SOLN
40.0000 mg | Freq: Once | SUBCUTANEOUS | Status: AC
  Administered 2010-11-05: 40 mg via SUBCUTANEOUS

## 2010-11-05 MED ORDER — MIDAZOLAM HCL 2 MG/2ML IJ SOLN
INTRAMUSCULAR | Status: AC
  Administered 2010-11-05: 2 mg via INTRAVENOUS
  Filled 2010-11-05: qty 2

## 2010-11-05 MED ORDER — LORAZEPAM 2 MG/ML IJ SOLN
0.5000 mg | Freq: Once | INTRAMUSCULAR | Status: AC
  Administered 2010-11-05: 0.5 mg via INTRAVENOUS

## 2010-11-05 MED ORDER — LIDOCAINE HCL (PF) 1 % IJ SOLN
INTRAMUSCULAR | Status: AC
  Filled 2010-11-05: qty 5

## 2010-11-05 MED ORDER — MIDAZOLAM HCL 2 MG/2ML IJ SOLN
INTRAMUSCULAR | Status: AC
  Filled 2010-11-05: qty 2

## 2010-11-05 MED ORDER — ONDANSETRON HCL 4 MG/2ML IJ SOLN: 4.0000 mg | Freq: Once | INTRAMUSCULAR | Status: DC | PRN

## 2010-11-05 MED ORDER — PROPOFOL 10 MG/ML IV EMUL
INTRAVENOUS | Status: AC
  Filled 2010-11-05: qty 20

## 2010-11-05 MED ORDER — SODIUM CHLORIDE 0.9 % IJ SOLN
INTRAMUSCULAR | Status: AC
  Filled 2010-11-05: qty 20

## 2010-11-05 MED ORDER — SODIUM CHLORIDE 0.9 % IV SOLN
Freq: Once | INTRAVENOUS | Status: AC
  Administered 2010-11-05: 24 mg via INTRAVENOUS
  Filled 2010-11-05: qty 12

## 2010-11-05 MED ORDER — FENTANYL CITRATE 0.05 MG/ML IJ SOLN
INTRAMUSCULAR | Status: DC | PRN
  Administered 2010-11-05: 50 ug via INTRAVENOUS

## 2010-11-05 SURGICAL SUPPLY — 47 items
ADH SKN CLS APL DERMABOND .7 (GAUZE/BANDAGES/DRESSINGS) ×4
APPLIER CLIP 9.375 SM OPEN (CLIP)
APR CLP SM 9.3 20 MLT OPN (CLIP)
BAG DECANTER FOR FLEXI CONT (MISCELLANEOUS) ×3 IMPLANT
BAG HAMPER (MISCELLANEOUS) ×3 IMPLANT
CATH HICKMAN DUAL 12.0 (CATHETERS) IMPLANT
CLIP APPLIE 9.375 SM OPEN (CLIP) IMPLANT
CLOTH BEACON ORANGE TIMEOUT ST (SAFETY) ×3 IMPLANT
COVER LIGHT HANDLE STERIS (MISCELLANEOUS) ×6 IMPLANT
DECANTER SPIKE VIAL GLASS SM (MISCELLANEOUS) ×3 IMPLANT
DERMABOND ADVANCED (GAUZE/BANDAGES/DRESSINGS) ×2
DERMABOND ADVANCED .7 DNX12 (GAUZE/BANDAGES/DRESSINGS) ×4 IMPLANT
DRAPE C-ARM FOLDED MOBILE STRL (DRAPES) ×3 IMPLANT
DRSG TEGADERM 4X4.75 (GAUZE/BANDAGES/DRESSINGS) ×4 IMPLANT
DURAPREP 26ML APPLICATOR (WOUND CARE) ×3 IMPLANT
ELECT REM PT RETURN 9FT ADLT (ELECTROSURGICAL) ×3
ELECTRODE REM PT RTRN 9FT ADLT (ELECTROSURGICAL) ×2 IMPLANT
GLOVE BIO SURGEON STRL SZ7.5 (GLOVE) ×3 IMPLANT
GLOVE ECLIPSE 7.0 STRL STRAW (GLOVE) ×4 IMPLANT
GLOVE EXAM NITRILE MD LF STRL (GLOVE) ×2 IMPLANT
GLOVE INDICATOR 7.0 STRL GRN (GLOVE) ×3 IMPLANT
GLOVE INDICATOR 7.5 STRL GRN (GLOVE) ×3 IMPLANT
GOWN BRE IMP SLV AUR XL STRL (GOWN DISPOSABLE) ×12 IMPLANT
IV NS 500ML (IV SOLUTION) ×3
IV NS 500ML BAXH (IV SOLUTION) ×2 IMPLANT
KIT PORT POWER 8FR ISP MRI (CATHETERS) ×3 IMPLANT
KIT ROOM TURNOVER APOR (KITS) ×3 IMPLANT
MANIFOLD NEPTUNE II (INSTRUMENTS) ×3 IMPLANT
NEEDLE HYPO 18GX1.5 BLUNT FILL (NEEDLE) ×3 IMPLANT
NEEDLE HYPO 25X1 1.5 SAFETY (NEEDLE) ×3 IMPLANT
NS IRRIG 1000ML POUR BTL (IV SOLUTION) ×3 IMPLANT
PACK MINOR (CUSTOM PROCEDURE TRAY) ×3 IMPLANT
PAD ARMBOARD 7.5X6 YLW CONV (MISCELLANEOUS) ×3 IMPLANT
SET BASIN LINEN APH (SET/KITS/TRAYS/PACK) ×3 IMPLANT
SET INTRODUCER 12FR PACEMAKER (SHEATH) IMPLANT
SHEATH COOK PEEL AWAY SET 8F (SHEATH) IMPLANT
SPONGE GAUZE 2X2 8PLY STRL LF (GAUZE/BANDAGES/DRESSINGS) ×3 IMPLANT
SPONGE GAUZE 4X4 12PLY (GAUZE/BANDAGES/DRESSINGS) ×3 IMPLANT
STRIP CLOSURE SKIN 1/4X3 (GAUZE/BANDAGES/DRESSINGS) IMPLANT
SUT PROLENE 3 0 PS 2 (SUTURE) IMPLANT
SUT VIC AB 3-0 SH 27 (SUTURE) ×3
SUT VIC AB 3-0 SH 27X BRD (SUTURE) ×2 IMPLANT
SUT VIC AB 4-0 PS2 27 (SUTURE) ×5 IMPLANT
SYR 20CC LL (SYRINGE) ×3 IMPLANT
SYR CONTROL 10ML LL (SYRINGE) ×3 IMPLANT
SYRINGE 10CC LL (SYRINGE) ×3 IMPLANT
TOWEL OR 17X26 4PK STRL BLUE (TOWEL DISPOSABLE) ×3 IMPLANT

## 2010-11-05 NOTE — Anesthesia Preprocedure Evaluation (Addendum)
Anesthesia Evaluation  Name, MR# and DOB Patient awake  General Assessment Comment  Reviewed: Allergy & Precautions, H&P , Patient's Chart, lab work & pertinent test results and reviewed documented beta blocker date and time   History of Anesthesia Complications Negative for: history of anesthetic complications  Airway Mallampati: I TM Distance: >3 FB Neck ROM: Full    Dental  (+) Upper Dentures and Edentulous Lower   Pulmonaryneg pulmonary ROS    clear to auscultation    Cardiovascular hypertension, Pt. on home beta blockers + CAD + dysrhythmias Regular Normal   Neuro/Psych Neuromuscular disease  GI/Hepatic/Renal negative Liver ROS, and negative Renal ROS (+)  GERD Medicated and Controlled     Endo/Other   (+) Diabetes mellitus-, Type 2  Abdominal Normal abdominal exam  (+)   Musculoskeletal  (+) Arthritis -, Osteoarthritis,   Hematology Multiple myeloma   Peds  Reproductive/Obstetrics negative OB ROS   Anesthesia Other Findings             Anesthesia Physical Anesthesia Plan  ASA: III  Anesthesia Plan: MAC   Post-op Pain Management:    Induction:   Airway Management Planned: Nasal Cannula  Additional Equipment:   Intra-op Plan:   Post-operative Plan:   Informed Consent: I have reviewed the patients History and Physical, chart, labs and discussed the procedure including the risks, benefits and alternatives for the proposed anesthesia with the patient or authorized representative who has indicated his/her understanding and acceptance.     Plan Discussed with: CRNA  Anesthesia Plan Comments:         Anesthesia Quick Evaluation

## 2010-11-05 NOTE — Anesthesia Procedure Notes (Signed)
Procedures

## 2010-11-05 NOTE — Op Note (Signed)
Preop diagnosis: Dysfunctional Port-A-Cath, multiple myeloma  Postoperative diagnosis: Same  Procedure: Insertion of power port, removal of Port-A-Cath  Surgeon: Dr. Franky Macho  Anesthesia: MAC  Indications: Patient had a Port-A-Cath placed in the past for treatment of multiple myeloma. It has since failed to work and now the patient presents for removal of the old Port-A-Cath and insertion of a new Port-A-Cath. The risks and benefits of the procedure including bleeding, infection, and pneumothorax were fully explained to the patient, who gave informed consent.  Procedure note: Patient was placed in the supine position. Anesthesia provided monitored anesthesia care. 1% Xylocaine was used for local anesthesia. The patient was prepped and draped using the usual sterile technique with DuraPrep. Surgical site confirmation was performed.  An incision was made below the right clavicle. A subcutaneous pocket was then formed. A needle is advanced into the right subclavian vein using the Seldinger technique without difficulty. A guidewire was then advanced into the right atrium under fluoroscopic guidance. An introducer and peel away sheath was then placed over the guidewire. The catheter was inserted through the peel-away sheath the peel-away sheath was removed. The catheter was then attached to the port and the port placed in its subcutaneous pocket. Adequate positioning was confirmed by fluoroscopy. Good backflow was noted in the port. The port was flushed with hep flush. The port was accessed for further use later on today. The incision was closed using 3-0 Vicryl interrupted sutures. The skin was closed using 4-0 Vicryl subcuticular suture. Dermabond was then applied.  Next, the dysfunctional port was removed from the left side. An incision was made over the port. The port was removed in total without difficulty. It was disposed of. Any bleeding was controlled using Bovie electrocautery. The  subcutaneous layer was reapproximated using 3-0 Vicryl interrupted sutures. The skin was closed using 4-0 Vicryl subcuticular suture. Dermabond was then applied.  All tip needle counts are correct at the end of the procedure. The patient was transferred to PACU in stable condition. A chest x-ray will be performed at that time. Complications: None  Blood loss: Minimal specimen: None

## 2010-11-05 NOTE — Progress Notes (Signed)
Tolerated chemo well. ua obtained. Vss. Prednisone (20mg  tabs) take 4 daily (80mg ) x 5 days #40 called into [facility] at 1639 on 11/05/10.

## 2010-11-05 NOTE — Transfer of Care (Signed)
Immediate Anesthesia Transfer of Care Note  Patient: [name]  Procedure(s) Performed:  REMOVAL PORT-A-CATH - started at [phone]; INSERTION PORT-A-CATH - Right subclavian, device left accessed  Procedure done at 0844  Patient Location: PACU  Anesthesia Type: MAC  Level of Consciousness: awake, alert , oriented and patient cooperative  Airway & Oxygen Therapy: Patient Spontanous Breathing and Patient connected to nasal cannula oxygen  Post-op Assessment: Report given to PACU RN, Post -op Vital signs reviewed and stable, Patient moving all extremities and Patient moving all extremities X 4  Post vital signs: stable  Complications: No apparent anesthesia complications

## 2010-11-05 NOTE — Progress Notes (Signed)
Rt port tubing in place

## 2010-11-05 NOTE — Interval H&P Note (Signed)
No new medical issues.  OK for surgery.

## 2010-11-05 NOTE — Anesthesia Postprocedure Evaluation (Signed)
  Anesthesia Post-op Note  Patient: [name]  Procedure(s) Performed:  REMOVAL PORT-A-CATH - started at [phone]; INSERTION PORT-A-CATH - Right subclavian, device left accessed  Procedure done at 0844  Patient Location: PACU  Anesthesia Type: MAC  Level of Consciousness: alert   Airway and Oxygen Therapy: Patient Spontanous Breathing  Post-op Pain: none  Post-op Assessment: Post-op Vital signs reviewed, Patient's Cardiovascular Status Stable, Respiratory Function Stable, Patent Airway, No signs of Nausea or vomiting and Pain level controlled  Post-op Vital Signs: stable  Complications: No apparent anesthesia complications

## 2010-11-08 ENCOUNTER — Other Ambulatory Visit (HOSPITAL_COMMUNITY): Payer: Medicare HMO

## 2010-11-08 LAB — KAPPA/LAMBDA LIGHT CHAINS
Kappa free light chain: 1.34 mg/dL (ref 0.33–1.94)
Kappa, lambda light chain ratio: 0.24 — ABNORMAL LOW (ref 0.26–1.65)
Lambda free light chains: 5.61 mg/dL — ABNORMAL HIGH (ref 0.57–2.63)

## 2010-11-08 NOTE — Anesthesia Postprocedure Evaluation (Deleted)
  Anesthesia Post-op Note  Patient: [name]  Procedure(s) Performed:  REMOVAL PORT-A-CATH - started at [phone]; INSERTION PORT-A-CATH - Right subclavian, device left accessed  Procedure done at 0844  Patient Location: PACU  Anesthesia Type: MAC  Level of Consciousness: awake  Airway and Oxygen Therapy: Patient Spontanous Breathing  Post-op Pain: none  Post-op Assessment: Post-op Vital signs reviewed  Post-op Vital Signs: Reviewed  Complications: No apparent anesthesia complications

## 2010-11-08 NOTE — Anesthesia Postprocedure Evaluation (Signed)
  Anesthesia Post-op Note  Patient: [name]  Procedure(s) Performed:  REMOVAL PORT-A-CATH - started at [phone]; INSERTION PORT-A-CATH - Right subclavian, device left accessed  Procedure done at 0844  Patient Location: PACU  Anesthesia Type: MAC  Level of Consciousness: awake  Airway and Oxygen Therapy: Patient Spontanous Breathing  Post-op Pain: none  Post-op Assessment: Post-op Vital signs reviewed  Post-op Vital Signs: Reviewed  Complications: No apparent anesthesia complications

## 2010-11-08 NOTE — Addendum Note (Signed)
Addendum  created 11/08/10 1610 by Aurora Mask, CRNA   Modules edited:Notes Section

## 2010-11-08 NOTE — Anesthesia Postprocedure Evaluation (Deleted)
  Anesthesia Post-op Note  Patient: [name]  Procedure(s) Performed:  REMOVAL PORT-A-CATH - started at [phone]; INSERTION PORT-A-CATH - Right subclavian, device left accessed  Procedure done at 0844  Patient Location: PACU  Anesthesia Type: MAC  Level of Consciousness: awake and oriented  Airway and Oxygen Therapy: Patient Spontanous Breathing  Post-op Pain: none  Post-op Assessment: Post-op Vital signs reviewed  Post-op Vital Signs: Reviewed and stable  Complications: No apparent anesthesia complications

## 2010-11-10 LAB — MULTIPLE MYELOMA PANEL, SERUM
Albumin ELP: 53.8 % — ABNORMAL LOW (ref 55.8–66.1)
Alpha-2-Globulin: 14.4 % — ABNORMAL HIGH (ref 7.1–11.8)
Beta 2: 5 % (ref 3.2–6.5)
Beta Globulin: 5.1 % (ref 4.7–7.2)
IgM, Serum: 52 mg/dL — ABNORMAL LOW (ref 52–322)
M-Spike, %: 0.5 g/dL

## 2010-11-11 ENCOUNTER — Telehealth (HOSPITAL_COMMUNITY): Payer: Self-pay

## 2010-11-11 NOTE — Telephone Encounter (Signed)
Spoke with [name], is having fatigue and dyspnea.  Agrees to come tomorrow for blood.  Sees PA also tomorrow.

## 2010-11-12 ENCOUNTER — Encounter (HOSPITAL_COMMUNITY): Payer: Medicare HMO

## 2010-11-12 ENCOUNTER — Encounter (HOSPITAL_BASED_OUTPATIENT_CLINIC_OR_DEPARTMENT_OTHER): Payer: Medicare HMO

## 2010-11-12 ENCOUNTER — Encounter (HOSPITAL_BASED_OUTPATIENT_CLINIC_OR_DEPARTMENT_OTHER): Payer: Medicare HMO | Admitting: Oncology

## 2010-11-12 VITALS — BP 132/80 | HR 74 | Temp 98.8°F | Resp 18

## 2010-11-12 DIAGNOSIS — C9000 Multiple myeloma not having achieved remission: Secondary | ICD-10-CM

## 2010-11-12 DIAGNOSIS — D649 Anemia, unspecified: Secondary | ICD-10-CM

## 2010-11-12 LAB — PREPARE RBC (CROSSMATCH)

## 2010-11-12 LAB — HEMOGLOBIN AND HEMATOCRIT, BLOOD: HCT: 26.7 % — ABNORMAL LOW (ref 36.0–46.0)

## 2010-11-12 MED ORDER — HEPARIN SOD (PORK) LOCK FLUSH 100 UNIT/ML IV SOLN
INTRAVENOUS | Status: AC
  Administered 2010-11-12: 500 [IU]
  Filled 2010-11-12: qty 5

## 2010-11-12 MED ORDER — SODIUM CHLORIDE 0.9 % IJ SOLN
INTRAMUSCULAR | Status: AC
  Administered 2010-11-12: 10 mL
  Filled 2010-11-12: qty 10

## 2010-11-12 MED ORDER — EPOETIN ALFA 40000 UNIT/ML IJ SOLN
INTRAMUSCULAR | Status: AC
  Filled 2010-11-12: qty 1

## 2010-11-12 MED ORDER — EPOETIN ALFA 40000 UNIT/ML IJ SOLN
40000.0000 [IU] | Freq: Once | INTRAMUSCULAR | Status: AC
  Administered 2010-11-12: 40000 [IU] via SUBCUTANEOUS

## 2010-11-12 NOTE — Progress Notes (Signed)
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   CURRENT THERAPY:  Cytoxan IV every 14 days and prednisone 80 mg days one through 5 every 28 days.  This regimen began on 06/18/2010.   HPI: The patient is here for followup today. She complains of extreme fatigue. Her hemoglobin a few days ago was reported to be in the low eights. A hemoglobin and hematocrit was performed today and reveal a hemoglobin of 8.9. I had initially anticipated transfusing 2 units of packed red blood cells however I will only transfuse 1 unit. Other than fatigue, the patient denies any complaints. She reports that she is going to try to utilize public transportation to get to her scheduled appointments. She explains that she is not to pleased about relying on her son.  Other than the above she denies any complaints today. She does admit to a decreased appetite which she is on Megace therapy for. She reports that her primary care physician told her that she had a bladder infection. Antibiotics were called into her pharmacy. However she is confused as to how to take them. The patient is unable to tell me the name of the prescription. As result we'll get a urinalysis today to evaluate for a urinary tract infection.  Of note, a UA performed on 7/13 was negative for a UTI.   REVIEW OF SYSTEMS:  Performance Status:Abnormal - ECOG  2   Constitutional: Normal - No Fever, chills, night sweats, or weight loss  Eyes: Normal - No change in visual acuity, blurred or double vision  Ears, Nose, Sinuses:Normal - No epistaxis, facial pain, nasal discharge or change in hearing  Mouth, Pharynx: Normal - No pain, ulceration, or sores noted  Cardiovascular:Normal - No chest pain, DOE, or palpitations  Respiratory: Normal - No shortness of breath, cough, hemoptysis, or pleuritic chest pain  Gastrointestinal:Normal - No  abdominal pain, nausea, vomiting, diarrhea, rectal pain or bleeding  Genitourinary: Normal - Denies Hematuria or dysuria  Musculoskeletal: Normal - No bone pain  Skin: Normal - No skin rash or lesions noted  Neurologic: Normal - No numbness, weakness, neuropathic pain or change in cognitive function  Psychiatric: Normal - No vegetative signs of depression  Endocrine: Normal - No symptoms of hypothyroidism or hyperglycemia  Hematologic: Normal - No bleeding or lymph nodes noted   PHYSICAL EXAMINATION:  General Appearance: Normal - Healthy appearing patient in no acute distress  Skin: Normal - No skin rash, petechiae, ecchymosis noted  HEENT: Normal - No oral or pharyngeal masses, ulceration or thrush noted, no sinus tenderness  Neck: Normal - Supple, normal carotid upstrokes, no palpable thyromegaly  Lymph Nodes: Normal - No palpable lymph nodes in the neck, supraclavicular, axillary, inguinal, or epitrochlear areas  Lungs/Thorax: Normal - Clear to auscultation and percussion  Heart: Normal - Regular rate and rhythm, normal S1, S2, no appreciable murmurs, rubs, gallops  Pulses: Normal - 2+ throughout and symmetrical  Abdomen:Normal - Soft, nontender, bowel sounds present, no appreciable hepatosplenomegaly, no palpable masses  Musculoskeletal: Normal - No pain on palpation over bony prominence, no edema, no evidence of gout, no joint or bony deformity  Neurologic: Normal - Grossly intact   LABORATORY DATA:  Results for orders placed in visit on 11/12/10 (from the past 48 hour(s))  HEMOGLOBIN AND HEMATOCRIT, BLOOD     Status: Abnormal   Collection Time   11/12/10  9:38 AM      Component Value Range Comment   Hemoglobin 8.7 (*) 12.0 - 15.0 (g/dL)    HCT 16.1 (*) 09.6 - 46.0 (%)   TYPE AND SCREEN     Status: Normal (Preliminary result)   Collection Time   11/12/10  9:38 AM      Component Value Range Comment   ABO/RH(D) A POS      Antibody Screen NEG      Sample  Expiration 11/15/2010      Unit Number [id number]      Blood Component Type RED CELLS,LR      Unit division 00      Status of Unit ALLOCATED      Transfusion Status OK TO TRANSFUSE      Crossmatch Result Compatible      Lab Results  Component Value Date   WBC 5.2 11/05/2010   HGB 8.7* 11/12/2010   HCT 26.7* 11/12/2010   MCV 94.5 11/05/2010   PLT 177 11/05/2010      ASSESSMENT:  1. IgG Lambda MM 2. H/O UTI 3. Decreased Appetite. 4. Decreased energy.   PLAN:  1. Type and cross and transfuse 1 unit PRBCs. 2. Return in one month for follow-up. 3. Lab work ordered for 12/03/10.  All questions were answered. The patient knows to call the clinic with any problems, questions or concerns. We can certainly see the patient much sooner if necessary.  The patient and plan discussed with Glenford Peers, MD and he is in agreement with the aforementioned.  I spent 15 minutes counseling the patient face to face. The total time spent in the appointment was 30 minutes.  [name]

## 2010-11-13 LAB — TYPE AND SCREEN: Unit division: 0

## 2010-11-17 ENCOUNTER — Encounter (HOSPITAL_COMMUNITY): Payer: Self-pay | Admitting: General Surgery

## 2010-11-19 ENCOUNTER — Ambulatory Visit (HOSPITAL_COMMUNITY): Payer: Medicare HMO

## 2010-11-19 ENCOUNTER — Encounter (HOSPITAL_BASED_OUTPATIENT_CLINIC_OR_DEPARTMENT_OTHER): Payer: Medicare HMO

## 2010-11-19 VITALS — BP 123/76 | HR 64 | Temp 98.5°F | Wt 193.8 lb

## 2010-11-19 DIAGNOSIS — C9000 Multiple myeloma not having achieved remission: Secondary | ICD-10-CM

## 2010-11-19 LAB — DIFFERENTIAL
Basophils Absolute: 0 10*3/uL (ref 0.0–0.1)
Lymphocytes Relative: 33 % (ref 12–46)
Lymphs Abs: 0.5 10*3/uL — ABNORMAL LOW (ref 0.7–4.0)
Neutrophils Relative %: 32 % — ABNORMAL LOW (ref 43–77)

## 2010-11-19 LAB — CBC
Platelets: 142 10*3/uL — ABNORMAL LOW (ref 150–400)
RBC: 3.11 MIL/uL — ABNORMAL LOW (ref 3.87–5.11)
WBC: 1.4 10*3/uL — CL (ref 4.0–10.5)

## 2010-11-19 MED ORDER — HEPARIN SOD (PORK) LOCK FLUSH 100 UNIT/ML IV SOLN
INTRAVENOUS | Status: AC
  Filled 2010-11-19: qty 5

## 2010-11-19 MED ORDER — OXYCODONE-ACETAMINOPHEN 5-325 MG PO TABS: 1.0000 | ORAL_TABLET | Freq: Four times a day (QID) | ORAL | Status: DC | PRN

## 2010-11-19 NOTE — Progress Notes (Signed)
Chemo held today due to low ANC. Rescheduled for 1 week from today.

## 2010-11-26 ENCOUNTER — Ambulatory Visit (HOSPITAL_COMMUNITY): Payer: Medicare HMO | Admitting: Oncology

## 2010-11-26 ENCOUNTER — Ambulatory Visit (HOSPITAL_COMMUNITY): Payer: Medicare HMO

## 2010-11-30 ENCOUNTER — Encounter (HOSPITAL_COMMUNITY): Payer: Self-pay | Admitting: *Deleted

## 2010-11-30 ENCOUNTER — Emergency Department (HOSPITAL_COMMUNITY): Payer: Medicare HMO

## 2010-11-30 ENCOUNTER — Inpatient Hospital Stay (HOSPITAL_COMMUNITY)
Admission: EM | Admit: 2010-11-30 | Discharge: 2010-12-03 | DRG: 149 | Disposition: A | Discharge status: Home / Self Care | Payer: Medicare HMO | Attending: Family Medicine | Admitting: Family Medicine

## 2010-11-30 ENCOUNTER — Other Ambulatory Visit: Payer: Self-pay

## 2010-11-30 DIAGNOSIS — R42 Dizziness and giddiness: Secondary | ICD-10-CM

## 2010-11-30 DIAGNOSIS — E785 Hyperlipidemia, unspecified: Secondary | ICD-10-CM | POA: Diagnosis present

## 2010-11-30 DIAGNOSIS — K219 Gastro-esophageal reflux disease without esophagitis: Secondary | ICD-10-CM | POA: Diagnosis present

## 2010-11-30 DIAGNOSIS — C9000 Multiple myeloma not having achieved remission: Secondary | ICD-10-CM | POA: Insufficient documentation

## 2010-11-30 DIAGNOSIS — E876 Hypokalemia: Secondary | ICD-10-CM | POA: Diagnosis not present

## 2010-11-30 DIAGNOSIS — Z85038 Personal history of other malignant neoplasm of large intestine: Secondary | ICD-10-CM

## 2010-11-30 DIAGNOSIS — D630 Anemia in neoplastic disease: Secondary | ICD-10-CM | POA: Diagnosis present

## 2010-11-30 DIAGNOSIS — R600 Localized edema: Secondary | ICD-10-CM | POA: Insufficient documentation

## 2010-11-30 DIAGNOSIS — I2510 Atherosclerotic heart disease of native coronary artery without angina pectoris: Secondary | ICD-10-CM | POA: Diagnosis present

## 2010-11-30 DIAGNOSIS — H8110 Benign paroxysmal vertigo, unspecified ear: Principal | ICD-10-CM | POA: Diagnosis present

## 2010-11-30 DIAGNOSIS — I10 Essential (primary) hypertension: Secondary | ICD-10-CM | POA: Diagnosis present

## 2010-11-30 LAB — CBC
HCT: 29.3 % — ABNORMAL LOW (ref 36.0–46.0)
Hemoglobin: 9.4 g/dL — ABNORMAL LOW (ref 12.0–15.0)
MCH: 30.5 pg (ref 26.0–34.0)
MCHC: 32.1 g/dL (ref 30.0–36.0)
MCV: 95.1 fL (ref 78.0–100.0)
RDW: 15.8 % — ABNORMAL HIGH (ref 11.5–15.5)

## 2010-11-30 LAB — BASIC METABOLIC PANEL
BUN: 14 mg/dL (ref 6–23)
Calcium: 9.4 mg/dL (ref 8.4–10.5)
Creatinine, Ser: 1.72 mg/dL — ABNORMAL HIGH (ref 0.50–1.10)
GFR calc Af Amer: 36 mL/min — ABNORMAL LOW (ref >60)
GFR calc non Af Amer: 29 mL/min — ABNORMAL LOW (ref >60)
Glucose, Bld: 122 mg/dL — ABNORMAL HIGH (ref 70–99)

## 2010-11-30 LAB — POCT I-STAT TROPONIN I: Troponin i, poc: 0 ng/mL (ref 0.00–0.08)

## 2010-11-30 NOTE — Discharge Summary (Signed)
  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  The patient was found to have recurrent episodes of tremulous, so forth reported to the ER, was found to be hypoglycemic with blood sugars in the 26-58 ranges was recurrent.  She was on oral hypoglycemic agents at the time and this was subsequently stopped.  She was given D5 normal saline and meals and she did have some mild hypoglycemia as the first 24 hours, however, the second day in hospital her sugars were normoglycemic.  She was subsequently switched to normal saline with good meals and had no episode of hypoglycemia.  It was felt at this time to discontinue her antidiabetic medicines.  She was hemodynamically stable in the hospital. Hemoglobin was 8.9 which is a chronic issue, blood pressure was in the 120 range systolic although it had been somewhat low upon admission and laboratory wise her BMET showed a creatinine which had improved from 2.68 on admission to 1.98 at the day of discharge.  Thyroid function within normal limits and liver function was within normal limits.  Her medicines were simplified as she has difficulty following instructions at times and she was subsequently seen by Surgery to have a Port-A-Cath changed.  They however called the emergency and could not perform this in hospital so this will be arranged as an outpatient with Dr. Leticia Penna.  MEDICINES:  Her discharge  medicines which we now simplified include; 1. Acyclovir 200 mg p.o. daily. 2. Amlodipine 5 mg p.o. daily. 3. Cipro 500 mg p.o. b.i.d. for 5 days for UTI while in hospital with     Klebsiella pneumonia. 4. Spirolactone 25 mg p.o. daily. 5. Crestor 10 mg p.o. daily. 6. Diovan 160/12.5 p.o. daily. 7. Bystolic 5 mg p.o. daily. 8. MiraLax 17 g p.o. daily p.r.n. 9. Prilosec 20 mg p.o. daily. 10.Aspirin 81 mg per day.  The patient will follow up in my office in 1 week's time to assess glycemic control and renal function.     Melvyn Novas, MD     RMD/MEDQ  D:  10/29/2010  T:  10/30/2010  Job:  [id number]  Electronically Signed by Oval Linsey MD on 11/30/2010 03:12:56 PM

## 2010-11-30 NOTE — ED Notes (Signed)
Patient with severe dizziness that she feels like she may pass out, denies passing out; first times this morning and second time this afternoon while lending over then happened again; denies decrease in PO/fluids intake

## 2010-11-30 NOTE — H&P (Signed)
NAMEJEANE, CASHATT NO.:  [id number]  MEDICAL RECORD NO.:  [id number]  LOCATION:  A332                          FACILITY:  [facility]  PHYSICIAN:  Melvyn Novas, MDDATE OF BIRTH:  Nov 30, 1940  DATE OF ADMISSION:  10/26/2010 DATE OF DISCHARGE:  LH                             HISTORY & PHYSICAL   HISTORY OF PRESENT ILLNESS:  The patient is a 70 year old black female with extensive medical history of including history of diabetes, chronic renal failure, hypertension, hyperlipidemia, multiple myeloma, and anemia.  The patient apparently had come over to have her left subclavian Port-A-Cath evaluated and was found to have some tremulous sensation and found to have glucose of 28 and was subsequently sent to the ER.  She had hypoglycemia and she was on glipizide 5 mg per day. The patient was given IV fluids, something to eat and the sugar came up to 68, however, felt that this was a long-term vigilance for recurrent hypoglycemia; therefore, she is admitted.  She denies angina, anginal equivalents, orthopnea, PND.  At the time of seeing her, I do not have evaluation for renal function, however, creatinine was 2.4 one week ago in clinic.  PAST MEDICAL HISTORY:  Significant for the aforementioned multiple myeloma undergoing chemotherapy, anemia, chronic renal failure, hypertension, hyperlipidemia, and type 2 diabetes.  PAST SURGICAL HISTORY:  Remarkable for left hemicolectomy with sigmoid anastomoses, vaginal hysterectomy, appendectomy, some knee surgery nondescript, and a left Port-A-Cath subclavian area 2 years ago.  She has a right brachial AV fistula planned for the near future.  ALLERGIES:  She has no known allergies.  CURRENT MEDICINES: 1. Bystolic 5 mg p.o. daily. 2. Diovan 320 p.o. daily. 3. Crestor 20 mg p.o. daily. 4. Spirolactone 50 mg p.o. b.i.d. 5. Norvasc 5 mg p.o. daily. 6. MiraLAX 17 grams p.o. daily p.r.n. 7. Acyclovir 200 mg p.o.  daily. 8. Triamterene p.o. daily.  PHYSICAL EXAMINATION:  VITAL SIGNS:  Blood pressure at present is 99/63, pulse is 80 and regular, respiratory rate is 20.  She denies dizziness, and O2 sat is 98% on room air. EYES:  PERRLA.  Extraocular movements are intact.  Sclerae clear. Conjunctivae pink. NECK:  No JVD, no carotid bruits, no thyromegaly or bruits. LUNGS:  Show prolonged respiratory phase.  No rales, wheeze, or rhonchi appreciable. HEART:  Regular rhythm.  No murmurs, gallops, heaves, thrills, or rubs. ABDOMEN:  Soft and nontender.  Bowel sounds are normoactive.  No guarding rebound, mass, or megaly. EXTREMITIES:  Trace to 1+ pedal edema. NEUROLOGIC:  Cranial nerves II through XII grossly intact.  The patient moves all 4 extremities.  Plantars are downgoing.  IMPRESSION:  As follows. 1. Recurrent hypoglycemia over 10-hour period in the ER, possibly due     to oral hypoglycemics. 2. Multiple myeloma undergoing chemotherapy. 3. Chronic renal failure. 4. Hypertension. 5. Anemia. 6. Hyperlipidemia. 7. Hypertension.  PLAN:  The plan at present is to get a.c. and bedtime glucoses, stop glipizide, give D5 half-normal saline at 100 mL an hour.  Monitor renal function daily x3, administer Lovenox 30 mg subcu daily.  Check TSH in a.m., and I will make further recommendations as the database expands.  Melvyn Novas, MD     RMD/MEDQ  D:  10/26/2010  T:  10/27/2010  Job:  [id number]  Electronically Signed by Oval Linsey MD on 11/30/2010 03:12:52 PM

## 2010-11-30 NOTE — ED Provider Notes (Signed)
Scribed for Dr. Manus Gunning, the patient was seen in room 10. This chart was scribed by Hillery Hunter. This patient's care was started at 21:20.    History     CSN: [id number] Arrival date & time: 11/30/2010  8:48 PM  Chief Complaint  Patient presents with  . Near Syncope    denies LOC   HPI  Patient complains of breaking out in sweats last night, continuing today. She states that she woke up with dizziness she described as, "like the room was closing in and spinning" and states that this worsened suddenly three times, once when tilting head to look over bed. She also describes some fluttering palpitations but denies chest pain and shortness of breath. She denies neck pain and any other complaints but says she has been feeling anxious. Her PCP is Dr. Aris Georgia. She has a history of CAD with 1 stent, myeloma currently on chemo and missed last dosage four days ago, "because I had a few loose stools." Nystrum is her oncologist.  Past Medical History  Diagnosis Date  . Hypertension   . Coronary artery disease   . Diabetes mellitus     borderline  . GERD (gastroesophageal reflux disease)   . Cancer     colon: takes chemo  . Hypercholesteremia   . Multiple myeloma   . Female stress incontinence   . UTI (urinary tract infection) 11/05/2010    Past Surgical History  Procedure Date  . Colon surgery     resection, [facility]  . Abdominal hysterectomy 1970s    [facility]  . Knee arthroscopy     LEFT, [facility]  . Heel spur surgery     KEELING, [facility]  . Portacath placement     [facility]   . Cervical spine surgery 2004    [facility]  . Port-a-cath removal 11/05/2010    Procedure: REMOVAL PORT-A-CATH;  Surgeon: Dalia Heading;  Location: [facility];  Service: General;  Laterality: Left;  started at 0846  . Portacath placement 11/05/2010    Procedure: INSERTION PORT-A-CATH;  Surgeon: Dalia Heading;  Location: [facility];  Service: General;  Laterality: Right;  Right subclavian, device left accessed  Procedure done at  0844    Family History  Problem Relation Age of Onset  . Anesthesia problems Neg Hx   . Hypotension Neg Hx   . Malignant hyperthermia Neg Hx   . Pseudochol deficiency Neg Hx     History  Substance Use Topics  . Smoking status: Former Smoker -- 0.2 packs/day    Quit date: 11/03/1990  . Smokeless tobacco: Not on file  . Alcohol Use: No    Review of Systems  Physical Exam  BP 148/73  Pulse 67  Temp(Src) 99.5 F (37.5 C) (Oral)  Resp 18  Ht 5\' 5"  (1.651 m)  Wt 198 lb (89.812 kg)  BMI 32.95 kg/m2  SpO2 99%  Physical Exam  Constitutional: She is oriented to person, place, and time. She appears well-developed and well-nourished. No distress.  HENT:  Head: Normocephalic and atraumatic.  Mouth/Throat: No oropharyngeal exudate.  Eyes: EOM are normal. Pupils are equal, round, and reactive to light.       No nystagmus  Neck: Normal range of motion. Neck supple.  Cardiovascular: Normal rate, regular rhythm and normal heart sounds.   No murmur heard. Pulmonary/Chest: Breath sounds normal. No respiratory distress. She has no wheezes.  Abdominal: Bowel sounds are normal. She exhibits no distension. There is no tenderness.  Neurological: She is alert  and oriented to person, place, and time. No cranial nerve deficit. Coordination normal.       Dix-Hallpike test negative  Skin: Skin is warm and dry.  Psychiatric: She has a normal mood and affect. Her behavior is normal. Judgment and thought content normal.    ED Course  Procedures  OTHER DATA REVIEWED: Nursing notes, vital signs, and past medical records reviewed.   DIAGNOSTIC STUDIES: Oxygen Saturation is 99% on room air, normal by my interpretation.    EKG: NSR, rate 63, LAD, LVH, T-waves flattened in lateral leads, unchanged from previous in March '12.   LABS / RADIOLOGY:  Results for orders placed during the hospital encounter of 11/30/10  CBC      Component Value Range   WBC 4.9  4.0 - 10.5 (K/uL)   RBC 3.08 (*)  3.87 - 5.11 (MIL/uL)   Hemoglobin 9.4 (*) 12.0 - 15.0 (g/dL)   HCT 78.2 (*) 95.6 - 46.0 (%)   MCV 95.1  78.0 - 100.0 (fL)   MCH 30.5  26.0 - 34.0 (pg)   MCHC 32.1  30.0 - 36.0 (g/dL)   RDW 21.3 (*) 08.6 - 15.5 (%)   Platelets 168  150 - 400 (K/uL)  BASIC METABOLIC PANEL      Component Value Range   Sodium 140  135 - 145 (mEq/L)   Potassium 3.7  3.5 - 5.1 (mEq/L)   Chloride 105  96 - 112 (mEq/L)   CO2 21  19 - 32 (mEq/L)   Glucose, Bld 122 (*) 70 - 99 (mg/dL)   BUN 14  6 - 23 (mg/dL)   Creatinine, Ser 5.78 (*) 0.50 - 1.10 (mg/dL)   Calcium 9.4  8.4 - 46.9 (mg/dL)   GFR calc non Af Amer 29 (*) >60 (mL/min)   GFR calc Af Amer 36 (*) >60 (mL/min)  PROTIME-INR      Component Value Range   Prothrombin Time 12.8  11.6 - 15.2 (seconds)   INR 0.94  0.00 - 1.49   POCT I-STAT TROPONIN I      Component Value Range   Troponin i, poc 0.00  0.00 - 0.08 (ng/mL)   Comment 3            Ct Head Wo Contrast  11/30/2010  *RADIOLOGY REPORT*  Clinical Data: Near-syncope, dizziness, blurred vision  CT HEAD WITHOUT CONTRAST  Technique:  Contiguous axial images were obtained from the base of the skull through the vertex without contrast.  Comparison: 06/24/2010  Findings: No evidence of parenchymal hemorrhage or extra-axial fluid collection. No mass lesion, mass effect, or midline shift.  No CT evidence of acute infarction.  Subcortical white matter and periventricular small vessel ischemic changes.  Cerebral volume is age appropriate.  No ventriculomegaly.  The visualized paranasal sinuses are essentially clear. The mastoid air cells are unopacified.  No evidence of calvarial fracture.  IMPRESSION: No evidence of acute intracranial abnormality.  Small vessel ischemic changes.  Original Report Authenticated By: Charline Bills, M.D.    ED COURSE / COORDINATION OF CARE: Orders Placed This Encounter  Procedures  . CT Head Wo Contrast  . DG Chest 1 View  . CBC  . Basic metabolic panel  . Urinalysis with  microscopic  . Protime-INR  . Orthostatic vital signs  . POCT i-Stat troponin I  . ED EKG     MDM: Differential Diagnosis: CVA, TIA, vertigo Symptoms not reproducible with Dix Hallpike maneuver and no nystagmus seen.  Patient had several episodes of  vertigo, "head heaviness" and lightheadedness while in ED. Neuro exam remains normal.  IMPRESSION: Diagnoses that have been ruled out:  Diagnoses that are still under consideration:  Final diagnoses:    PLAN:  Admission for rule out TIA  I personally performed the services described in this documentation, which was scribed in my presence.  The recorded information has been reviewed and considered.   Glynn Octave, MD 12/01/10 [phone]

## 2010-12-01 ENCOUNTER — Inpatient Hospital Stay (HOSPITAL_COMMUNITY): Payer: Medicare HMO

## 2010-12-01 LAB — VITAMIN B12: Vitamin B-12: 775 pg/mL (ref 211–911)

## 2010-12-01 LAB — IRON AND TIBC
Saturation Ratios: 43 % (ref 20–55)
TIBC: 223 ug/dL — ABNORMAL LOW (ref 250–470)

## 2010-12-01 LAB — FOLATE: Folate: 8.3 ng/mL

## 2010-12-01 LAB — RETICULOCYTES
RBC.: 3.34 MIL/uL — ABNORMAL LOW (ref 3.87–5.11)
Retic Ct Pct: 1.6 % (ref 0.4–3.1)

## 2010-12-01 LAB — MAGNESIUM: Magnesium: 1.7 mg/dL (ref 1.5–2.5)

## 2010-12-01 MED ORDER — SODIUM CHLORIDE 0.9 % IJ SOLN
3.0000 mL | Freq: Two times a day (BID) | INTRAMUSCULAR | Status: DC
  Administered 2010-12-01 – 2010-12-02 (×3): 3 mL via INTRAVENOUS
  Filled 2010-12-01 (×4): qty 3

## 2010-12-01 MED ORDER — OLMESARTAN MEDOXOMIL 20 MG PO TABS
40.0000 mg | ORAL_TABLET | Freq: Every day | ORAL | Status: DC
  Administered 2010-12-01 – 2010-12-03 (×3): 40 mg via ORAL
  Filled 2010-12-01 (×3): qty 2

## 2010-12-01 MED ORDER — NEBIVOLOL HCL 2.5 MG PO TABS
5.0000 mg | ORAL_TABLET | Freq: Every day | ORAL | Status: DC
  Administered 2010-12-01 – 2010-12-03 (×3): 5 mg via ORAL
  Filled 2010-12-01: qty 1
  Filled 2010-12-01 (×6): qty 2

## 2010-12-01 MED ORDER — ACYCLOVIR 200 MG PO CAPS
200.0000 mg | ORAL_CAPSULE | Freq: Two times a day (BID) | ORAL | Status: DC
  Administered 2010-12-01 – 2010-12-03 (×5): 200 mg via ORAL
  Filled 2010-12-01 (×11): qty 1

## 2010-12-01 MED ORDER — ASPIRIN EC 81 MG PO TBEC
81.0000 mg | DELAYED_RELEASE_TABLET | Freq: Every day | ORAL | Status: DC
  Administered 2010-12-01 – 2010-12-03 (×3): 81 mg via ORAL
  Filled 2010-12-01 (×3): qty 1

## 2010-12-01 MED ORDER — MECLIZINE HCL 12.5 MG PO TABS
12.5000 mg | ORAL_TABLET | Freq: Three times a day (TID) | ORAL | Status: DC
  Administered 2010-12-01 – 2010-12-03 (×6): 12.5 mg via ORAL
  Filled 2010-12-01 (×6): qty 1

## 2010-12-01 MED ORDER — PANTOPRAZOLE SODIUM 40 MG PO TBEC
40.0000 mg | DELAYED_RELEASE_TABLET | Freq: Every day | ORAL | Status: DC
  Administered 2010-12-01 – 2010-12-02 (×2): 40 mg via ORAL
  Filled 2010-12-01 (×2): qty 1

## 2010-12-01 MED ORDER — LORAZEPAM 1 MG PO TABS: 1.0000 mg | ORAL_TABLET | Freq: Two times a day (BID) | ORAL | Status: DC | PRN

## 2010-12-01 MED ORDER — TRETINOIN 10 MG PO CAPS: 10.0000 mg | ORAL_CAPSULE | Freq: Two times a day (BID) | ORAL | Status: DC

## 2010-12-01 MED ORDER — AMLODIPINE BESYLATE 5 MG PO TABS
5.0000 mg | ORAL_TABLET | Freq: Every day | ORAL | Status: DC
  Administered 2010-12-01 – 2010-12-03 (×3): 5 mg via ORAL
  Filled 2010-12-01 (×3): qty 1

## 2010-12-01 MED ORDER — SPIRONOLACTONE 25 MG PO TABS
50.0000 mg | ORAL_TABLET | Freq: Every day | ORAL | Status: DC
  Administered 2010-12-01 – 2010-12-03 (×3): 50 mg via ORAL
  Filled 2010-12-01 (×3): qty 2

## 2010-12-01 MED ORDER — ENOXAPARIN SODIUM 40 MG/0.4ML ~~LOC~~ SOLN
40.0000 mg | SUBCUTANEOUS | Status: DC
  Administered 2010-12-01 – 2010-12-03 (×3): 40 mg via SUBCUTANEOUS
  Filled 2010-12-01 (×3): qty 0.4

## 2010-12-01 MED ORDER — ROSUVASTATIN CALCIUM 20 MG PO TABS
20.0000 mg | ORAL_TABLET | Freq: Every day | ORAL | Status: DC
  Administered 2010-12-01 – 2010-12-02 (×2): 20 mg via ORAL
  Filled 2010-12-01 (×2): qty 1

## 2010-12-01 MED ORDER — SODIUM CHLORIDE 0.9 % IJ SOLN
3.0000 mL | INTRAMUSCULAR | Status: DC | PRN
  Administered 2010-12-01 (×2): 3 mL via INTRAVENOUS

## 2010-12-01 MED ORDER — OXYBUTYNIN CHLORIDE 5 MG PO TABS
5.0000 mg | ORAL_TABLET | Freq: Two times a day (BID) | ORAL | Status: DC
  Administered 2010-12-01 – 2010-12-03 (×5): 5 mg via ORAL
  Filled 2010-12-01 (×5): qty 1

## 2010-12-01 NOTE — H&P (Signed)
[id number] 

## 2010-12-02 LAB — BASIC METABOLIC PANEL
CO2: 23 mEq/L (ref 19–32)
Calcium: 9.1 mg/dL (ref 8.4–10.5)
Creatinine, Ser: 1.47 mg/dL — ABNORMAL HIGH (ref 0.50–1.10)
GFR calc Af Amer: 43 mL/min — ABNORMAL LOW (ref >60)
Sodium: 144 mEq/L (ref 135–145)

## 2010-12-02 LAB — TSH: TSH: 1.367 u[IU]/mL (ref 0.350–4.500)

## 2010-12-02 MED ORDER — ACYCLOVIR 200 MG PO CAPS
ORAL_CAPSULE | ORAL | Status: AC
  Filled 2010-12-02: qty 1

## 2010-12-02 MED ORDER — POTASSIUM CHLORIDE CRYS ER 20 MEQ PO TBCR
20.0000 meq | EXTENDED_RELEASE_TABLET | Freq: Every day | ORAL | Status: AC
  Administered 2010-12-02 – 2010-12-03 (×2): 20 meq via ORAL
  Filled 2010-12-02 (×2): qty 1

## 2010-12-02 NOTE — Progress Notes (Signed)
Physical Therapy Evaluation Patient Name: [name] Date: 12/02/2010 Problem List:  Patient Active Problem List  Diagnoses  . MULTIPLE  MYELOMA  . HYPERLIPIDEMIA  . ANEMIA  . HYPERTENSION  . Cor Athrscl-Uns Vessel  . DYSRHYTHMIA, CARDIAC NOS  . GASTROESOPHAGEAL REFLUX DISEASE  . GASTROPARESIS  . DYSPEPSIA  . CONSTIPATION NOS  . OVERACTIVE BLADDER  . ALOPECIA  . OSTEOARTHRITIS  . ARTHRALGIA  . LEG PAIN  . VERTIGO  . OTHER NONSPECIFIC FINDINGS EXAMINATION OF BLOOD  . PROTEINURIA  . HX, PERSONAL, MALIGNANCY, COLON  . UTI (urinary tract infection)  . Hypertension   Past Medical History:  Past Medical History  Diagnosis Date  . Hypertension   . Coronary artery disease   . Diabetes mellitus     borderline  . GERD (gastroesophageal reflux disease)   . Cancer     colon: takes chemo  . Hypercholesteremia   . Multiple myeloma   . Female stress incontinence   . UTI (urinary tract infection) 11/05/2010   Past Surgical History:  Past Surgical History  Procedure Date  . Colon surgery     resection, [facility]  . Abdominal hysterectomy 1970s    [facility]  . Knee arthroscopy     LEFT, [facility]  . Heel spur surgery     KEELING, [facility]  . Portacath placement     [facility]   . Cervical spine surgery 2004    [facility]  . Port-a-cath removal 11/05/2010    Procedure: REMOVAL PORT-A-CATH;  Surgeon: Dalia Heading;  Location: [facility];  Service: General;  Laterality: Left;  started at 0846  . Portacath placement 11/05/2010    Procedure: INSERTION PORT-A-CATH;  Surgeon: Dalia Heading;  Location: [facility];  Service: General;  Laterality: Right;  Right subclavian, device left accessed  Procedure done at 0844    Precautions/Restrictions  Precautions Precautions: Fall Required Braces or Orthoses: No Restrictions Weight Bearing Restrictions: No Prior Functioning  Home Living Type of Home: Mobile home Lives With: Family Receives Help From: Family Home Layout: One level Home Access: Ramped  entrance Bathroom Shower/Tub: Engineer, manufacturing systems: Standard Bathroom Accessibility: Yes How Accessible: Accessible via walker   Cognition Cognition Arousal/Alertness: Awake/alert Overall Cognitive Status: Appears within functional limits for tasks assessed Orientation Level: Oriented X4 Sensation/Coordination Sensation Light Touch: Appears Intact Proprioception: Appears Intact Extremity Assessment RUE Assessment RUE Assessment: Within Functional Limits (appears to have arthritic shoulder with decreased strength) LUE Assessment LUE Assessment: Within Functional Limits RLE Assessment RLE Assessment: Within Functional Limits (except quadriceps=2/5) LLE Assessment LLE Assessment: Within Functional Limits (except quadriceps=2-/5) Mobility (including Balance) Bed Mobility Bed Mobility:  (independent) Transfers Transfers: Yes Sit to Stand: 6: Modified independent (Device/Increase time)  Posture/Postural Control Posture/Postural Control: No significant limitations Balance Balance Assessed: No (as long as L eye is patched, no apparent problems) Exercise     End of Session PT - End of Session Equipment Utilized During Treatment: Gait belt Activity Tolerance: Patient tolerated treatment well;Patient limited by fatigue Patient left: in bed;with call bell in reach ([place] to be done immediately following PT) Nurse Communication: Mobility status for ambulation General Behavior During Session: [facility] for tasks performed Cognition: [facility] for tasks performed PT Assessment/Plan/Recommendation PT Assessment Clinical Impression Statement: deconditioned from chronic illness with new onset of vertigo which is minimized with patching L eye-needs to be using walker at home at all times due to quadriceps weakness PT Recommendation/Assessment: Patient will need skilled PT in the acute care venue PT Problem List: Decreased  strength;Decreased activity tolerance;Decreased mobility;Decreased  safety awareness;Decreased knowledge of use of DME;Other (comment) (vertigo) Problem List Comments: needs generalized strengthening with emphasis on quadriceps bilaterally Barriers to Discharge: None PT Therapy Diagnosis : Difficulty walking;Generalized weakness PT Plan PT Frequency: Min 3X/week PT Treatment/Interventions: DME instruction;Gait training;Therapeutic exercise (continue to patch L eye if that continues to minimize dizzin) PT Recommendation Follow Up Recommendations: Home health PT Equipment Recommended: Defer to next venue PT Goals  Acute Rehab PT Goals PT Goal Formulation: With patient Time For Goal Achievement: 2 weeks Pt will Ambulate: 51 - 150 feet;with modified independence Konrad Penta 12/02/2010, 9:38 AM

## 2010-12-02 NOTE — Progress Notes (Signed)
[id number] 

## 2010-12-02 NOTE — H&P (Signed)
NAME:  [name], [name] NO.:  [id number]  MEDICAL RECORD NO.:  [id number]  LOCATION:  A320                          FACILITY:  [facility]  PHYSICIAN:  Melvyn Novas, MDDATE OF BIRTH:  05-Aug-1940  DATE OF ADMISSION:  11/30/2010 DATE OF DISCHARGE:  LH                             HISTORY & PHYSICAL   The patient is a 70 year old black female who lives with her brother who has a history of multiple myeloma and chronic anemia, receiving Epogen injections every 2 weeks, who had 2 or 3 episodes of what is best described as a vertigo sensation over 2 days.  She denies syncope.  She denies concomitant anginal chest pain, palpitations.  She denies loss of consciousness.  There was no seizure activity reported.  She did not have any dyspnea or diaphoresis associated with these spells.  She became somewhat alarmed and her brother called some of the ambulance and came to the hospital where upon she was found to be somewhat hemodynamically stable.  Her hemoglobin was 9.4 which is chronic for her.  CT scan of her head reveals no evidence of acute infarction. Chest x-ray is normal and she is neurologically intact.  The patient is admitted for a neurologic checks, ruling out impending CVA.  She denies tremulousness or antecedent syncopal episodes.  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  PAST SURGICAL HISTORY:  Remarkable for colon resection due to carcinoma, vaginal hysterectomy, right knee arthroscopy, laminectomy to cervical spine for herniated nucleus pulposus, appendectomy as a child, and one- vessel coronary stenting in 1996.  She has no known allergies.  CURRENT MEDICINES: 1. Epogen 40,000 units every 2 weeks. 2. Aspirin 81 mg p.o. daily. 3. Crestor 20  mg p.o. daily. 4. Bystolic 5 mg p.o. daily. 5. Prilosec 20 mg p.o. b.i.d. 6. Spirolactone 50 mg p.o. daily. 7. Oxybutynin 5 mg p.o. daily. 8. Diovan 160 mg p.o. daily.  SOCIAL HISTORY:  She lives with her brother, is widowed, smokes 1/2 pack a day for 20 years, quit 1990.  Does not imbibe alcohol.  FAMILY HISTORY:  Noncontributory.  REVIEW OF SYSTEMS:  Negative for of melena, hematochezia, seizure, syncope, dysuria, polyuria, incontinence, dyspnea, orthopnea, or PND.  PHYSICAL EXAMINATION:  VITAL SIGNS:  Blood pressure at present is 156/78, temperature 98.4, pulse 61 and regular, respiratory rate is 16, O2 sat is 98%. HEENT:  Eyes, PERRLA.  Extraocular movements intact.  Sclerae clear. Conjunctivae pink. NECK:  No JVD, no carotid bruits, no thyromegaly, no thyroid bruits. LUNGS:  Showed prolonged expiratory phase.  No rales, wheeze, or rhonchi appreciable.  Diminished breath sounds at the bases. HEART:  Regular rhythm, 1/6 aortic outflow murmur.  No S3, S4, or gallops.  No heaves, thrills, or rubs. ABDOMEN:  Soft, nontender.  Bowel sounds normoactive.  No guarding, rebound, mass, or hepatosplenomegaly. EXTREMITIES:  Trace to 1+ pedal edema.  PERTINENT LABORATORIES:  CT scan of head reveals no evidence of acute infarction.  Chest x-ray reveals no evidence of active cardiopulmonary disease.  Hemoglobin is 9.4  which is chronically stable for her and a creatinine is 1.72, chronically stable for her.  The plan at present is to continue all current medicines and add Antivert 25 mg p.o. t.i.d. to see if we can ambulate the patient, get physical therapy, neurologic checks every shift for the first 24 hours and will assess thyroid function and glycemic control with a nebulous history of glucose intolerance.     Melvyn Novas, MD     RMD/MEDQ  D:  12/01/2010  T:  12/02/2010  Job:  [id number]

## 2010-12-03 ENCOUNTER — Other Ambulatory Visit (HOSPITAL_COMMUNITY): Payer: Medicare HMO

## 2010-12-03 ENCOUNTER — Other Ambulatory Visit (HOSPITAL_COMMUNITY): Payer: Self-pay | Admitting: Oncology

## 2010-12-03 ENCOUNTER — Inpatient Hospital Stay (HOSPITAL_COMMUNITY): Payer: Medicare HMO

## 2010-12-03 DIAGNOSIS — D649 Anemia, unspecified: Secondary | ICD-10-CM

## 2010-12-03 LAB — BASIC METABOLIC PANEL
Calcium: 9.3 mg/dL (ref 8.4–10.5)
GFR calc Af Amer: 39 mL/min — ABNORMAL LOW (ref >60)
GFR calc non Af Amer: 33 mL/min — ABNORMAL LOW (ref >60)
Potassium: 3.8 mEq/L (ref 3.5–5.1)
Sodium: 142 mEq/L (ref 135–145)

## 2010-12-03 MED ORDER — EPOETIN ALFA 40000 UNIT/ML IJ SOLN
40000.0000 [IU] | Freq: Once | INTRAMUSCULAR | Status: AC
  Administered 2010-12-03: 40000 [IU] via SUBCUTANEOUS

## 2010-12-03 MED ORDER — MECLIZINE HCL 12.5 MG PO TABS: 12.5000 mg | ORAL_TABLET | Freq: Three times a day (TID) | ORAL | Status: AC

## 2010-12-03 MED ORDER — OLMESARTAN MEDOXOMIL 40 MG PO TABS: 40.0000 mg | ORAL_TABLET | Freq: Every day | ORAL | Status: DC

## 2010-12-03 NOTE — Progress Notes (Signed)
Pt d/c from hospital today. Labs done 11/30/10, hgb 9.4. Procrit 40,000 units subq rt lower abdomen. Tolerated well.

## 2010-12-03 NOTE — Progress Notes (Signed)
NAMETANISHKA, DROLET NO.:  [id number]  MEDICAL RECORD NO.:  [id number]  LOCATION:  A320                          FACILITY:  [facility]  PHYSICIAN:  Melvyn Novas, MDDATE OF BIRTH:  [birth year]  DATE OF PROCEDURE: DATE OF DISCHARGE:                                PROGRESS NOTE   The patient is admitted with acute vertigo.  She has a history of multiple myeloma, hypertension, type 2 diabetes, chronic anemia from multiple myeloma on erythropoietin injections.  Today, her blood pressure is 122/75, temperature is 98, even pulse is 74 and regular, respiratory rate is 19, O2 sat is 96% on room air.  Hemoglobin was 9.4, which is stable for her and chronic on admission, and creatinine was 1.72, which improved to 1.47 with aggressive hydration.  Her potassium was mildly diminished at 83.4, normal on admission.  CT scan of her head revealed just chronic small vessel changes.  No evidence of acute infarct, and bilateral carotid ultrasound revealed no evidence of acute stenosis in major leg, increased flow velocities.  However, there was some tortuosity noted.  The patient still complains of vertigo, was placed on Antivert 12.5 mg p.o. t.i.d. yesterday, and complains of some swimmy headed sensations.  She denies angina, orthopnea, PND.  PHYSICAL EXAMINATION:  NECK:  No JVD.  No carotid bruits. LUNGS:  Diminished breath sounds at bases.  No rales, wheeze, or rhonchi appreciable. HEART:  Regular rhythm, 1/6 aortic outflow murmur.  No S3 or S4 auscultated. NEUROLOGIC:  The patient is neurologically intact, alert, cooperative, grossly intact.  Plan at present is to replete potassium with 20 mEq of K-Dur today and daily.  Repeat BMET in a.m.  If symptoms abate, we will presume discharge to be tomorrow on Antivert plus her admission medicines.     Melvyn Novas, MD     RMD/MEDQ  D:  12/02/2010  T:  12/03/2010  Job:  [id number]

## 2010-12-03 NOTE — Discharge Summary (Signed)
[name], [name] NO.:  [id number]  MEDICAL RECORD NO.:  [id number]  LOCATION:  A320                          FACILITY:  [facility]  PHYSICIAN:  Melvyn Novas, MDDATE OF BIRTH:  May 22, 1940  DATE OF ADMISSION:  11/30/2010 DATE OF DISCHARGE:  LH                              DISCHARGE SUMMARY   The patient is a 70 year old black female who essentially come in complaining of severe vertiginous sensations over 36 hour.  She was unable to walk and fell back on a bed several times.  The patient was subsequently admitted.  She was hemodynamically stable in the ER. Cardiac enzymes were essentially negative.  EKG unremarkable.  Her lungs were clear.  Heart:  Irregular rhythm.  No gallops, murmurs or rubs. Extremities show chronic 1+ pedal edema.  While in hospital, her laboratory values showed chronic anemia with hemoglobin of 9.4 due to multiple myeloma as well as some low potassium of 3.2 on the second day of admission.  This was repleted with 20 mEq potassium while in the hospital.  CT scan of head on admission reveals no evidence of acute infarct.  She had carotid ultrasounds performed revealing no evidence of critical stenosis or increased flow velocities.  She was continued on her low dose aspirin therapy.  The patient had Antivert 12.5 mg p.o. t.i.d. added to her hospital regimen and home regimen.  Her blood pressure was well and good control.  There were no dysrhythmias and she had physical therapy.  She had some mild vertigo while ambulating but with assistance and this was markedly less than upon admission.  We subsequently felt she achieved a maximal medical benefit and was subsequently discharged on the following medicines acyclovir 2 mg p.o. daily, Norvasc 5 mg p.o. daily, aspirin 81 mg p.o. daily, meclizine 12.5 mg p.o. t.i.d. p.r.n. for dizziness, Megace  40 mg per milliliter suspension, Bystolic 5 mg p.o. daily, Benicar 40 mg p.o. daily,  Prilosec 20 mg p.o. daily, Ditropan 5 mg p.o. daily.  The patient will follow up in my office in 3 days' time to check her potassium and hemodynamic status as well as her vertigo sensations.     Melvyn Novas, MD     RMD/MEDQ  D:  12/03/2010  T:  12/03/2010  Job:  [id number]

## 2010-12-03 NOTE — Plan of Care (Signed)
Problem: Discharge Progression Outcomes Goal: Other Discharge Outcomes/Goals IV removed from left hand site benign cath tip intact Pt is without C/O dizziness vertigo ambulating without assistance. Discharge to Speciality clinic where she has a previous appointment

## 2010-12-06 ENCOUNTER — Encounter (HOSPITAL_COMMUNITY): Payer: Medicare HMO | Attending: Oncology | Admitting: Oncology

## 2010-12-06 DIAGNOSIS — C9000 Multiple myeloma not having achieved remission: Secondary | ICD-10-CM | POA: Insufficient documentation

## 2010-12-06 DIAGNOSIS — D649 Anemia, unspecified: Secondary | ICD-10-CM | POA: Insufficient documentation

## 2010-12-06 DIAGNOSIS — I10 Essential (primary) hypertension: Secondary | ICD-10-CM | POA: Insufficient documentation

## 2010-12-10 ENCOUNTER — Inpatient Hospital Stay (HOSPITAL_COMMUNITY): Payer: Medicare HMO

## 2010-12-13 ENCOUNTER — Ambulatory Visit (HOSPITAL_COMMUNITY): Payer: Medicare HMO | Admitting: Oncology

## 2010-12-17 ENCOUNTER — Encounter (HOSPITAL_BASED_OUTPATIENT_CLINIC_OR_DEPARTMENT_OTHER): Payer: Medicare HMO

## 2010-12-17 DIAGNOSIS — D649 Anemia, unspecified: Secondary | ICD-10-CM

## 2010-12-17 DIAGNOSIS — I10 Essential (primary) hypertension: Secondary | ICD-10-CM

## 2010-12-17 DIAGNOSIS — C9000 Multiple myeloma not having achieved remission: Secondary | ICD-10-CM

## 2010-12-17 LAB — CBC
Hemoglobin: 10.2 g/dL — ABNORMAL LOW (ref 12.0–15.0)
MCH: 30.7 pg (ref 26.0–34.0)
MCHC: 32.2 g/dL (ref 30.0–36.0)
Platelets: 164 10*3/uL (ref 150–400)
RDW: 16 % — ABNORMAL HIGH (ref 11.5–15.5)

## 2010-12-17 LAB — DIFFERENTIAL
Basophils Absolute: 0 10*3/uL (ref 0.0–0.1)
Basophils Relative: 0 % (ref 0–1)
Eosinophils Absolute: 0.2 10*3/uL (ref 0.0–0.7)
Monocytes Relative: 8 % (ref 3–12)
Neutrophils Relative %: 74 % (ref 43–77)

## 2010-12-17 MED ORDER — EPOETIN ALFA 40000 UNIT/ML IJ SOLN
40000.0000 [IU] | Freq: Once | INTRAMUSCULAR | Status: AC
  Administered 2010-12-17: 40000 [IU] via SUBCUTANEOUS

## 2010-12-17 MED ORDER — HEPARIN SOD (PORK) LOCK FLUSH 100 UNIT/ML IV SOLN
INTRAVENOUS | Status: AC
  Administered 2010-12-17: 500 [IU] via INTRAVENOUS
  Filled 2010-12-17: qty 5

## 2010-12-17 MED ORDER — SODIUM CHLORIDE 0.9 % IJ SOLN
INTRAMUSCULAR | Status: AC
  Administered 2010-12-17: 10 mL via INTRAVENOUS
  Filled 2010-12-17: qty 10

## 2010-12-17 MED ORDER — EPOETIN ALFA 40000 UNIT/ML IJ SOLN
INTRAMUSCULAR | Status: AC
  Administered 2010-12-17: 40000 [IU] via SUBCUTANEOUS
  Filled 2010-12-17: qty 1

## 2010-12-17 NOTE — Progress Notes (Signed)
[name] presented for Portacath access and flush. Proper placement of portacath confirmed by CXR. Portacath located rt chest wall accessed with  H 20 needle. Good blood return present. Portacath flushed with 20ml NS and 500U/25ml Heparin and needle removed intact. Procedure without incident. Patient tolerated procedure well.  Labs drawnMary CARLETHIA Ray presents today for injection per MD orders. Procrit 16109 units administered SQ in left Abdomen. Administration without incident. Patient tolerated well.

## 2010-12-20 LAB — KAPPA/LAMBDA LIGHT CHAINS: Lambda free light chains: 4.07 mg/dL — ABNORMAL HIGH (ref 0.57–2.63)

## 2010-12-22 LAB — MULTIPLE MYELOMA PANEL, SERUM
Albumin ELP: 57 % (ref 55.8–66.1)
Alpha-1-Globulin: 5.5 % — ABNORMAL HIGH (ref 2.9–4.9)
IgA: 46 mg/dL — ABNORMAL LOW (ref 69–380)
IgG (Immunoglobin G), Serum: 1070 mg/dL (ref 690–1700)
IgM, Serum: 41 mg/dL — ABNORMAL LOW (ref 52–322)
Total Protein: 6.5 g/dL (ref 6.0–8.3)

## 2010-12-23 NOTE — Progress Notes (Signed)
Encounter addended by: Clarene Critchley on: 12/23/2010 11:54 AM<BR>     Documentation filed: Flowsheet VN

## 2010-12-24 ENCOUNTER — Inpatient Hospital Stay (HOSPITAL_COMMUNITY): Payer: Medicare HMO

## 2010-12-29 ENCOUNTER — Ambulatory Visit (HOSPITAL_COMMUNITY): Payer: Medicare HMO | Admitting: Oncology

## 2010-12-30 ENCOUNTER — Other Ambulatory Visit (HOSPITAL_COMMUNITY): Payer: Self-pay | Admitting: Oncology

## 2010-12-31 ENCOUNTER — Other Ambulatory Visit (HOSPITAL_COMMUNITY): Payer: Medicare HMO

## 2010-12-31 ENCOUNTER — Ambulatory Visit (HOSPITAL_COMMUNITY): Payer: Medicare HMO

## 2011-01-06 ENCOUNTER — Encounter (HOSPITAL_COMMUNITY): Payer: Medicare HMO | Attending: Oncology | Admitting: Oncology

## 2011-01-06 ENCOUNTER — Encounter (HOSPITAL_COMMUNITY): Payer: Self-pay | Admitting: Oncology

## 2011-01-06 DIAGNOSIS — G478 Other sleep disorders: Secondary | ICD-10-CM

## 2011-01-06 DIAGNOSIS — C9000 Multiple myeloma not having achieved remission: Secondary | ICD-10-CM | POA: Insufficient documentation

## 2011-01-06 DIAGNOSIS — R42 Dizziness and giddiness: Secondary | ICD-10-CM

## 2011-01-06 DIAGNOSIS — E785 Hyperlipidemia, unspecified: Secondary | ICD-10-CM | POA: Insufficient documentation

## 2011-01-06 NOTE — Progress Notes (Signed)
Diane Stalling, MD [address] [place] [address]  1. MULTIPLE  MYELOMA   2. Vertigo   3. Poor sleep pattern     CURRENT THERAPY:S/P Cytoxan IV every 14 days and prednisone 80 mg days one through 5 every 28 days. This regimen began on 06/18/2010.  INTERVAL HISTORY: [name] 70 y.o. 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  The patient brings to light 2 complaints.  She continues to have dizziness with vertical positional changes.  This is being followed and treated by her PCP.  She in unable to tell me whether her dizziness consists of just her surroundings moving or her herself.  The patient is presently on meclizine for this per her PCP.    The patient also complains of poor sleeping pattern.  She sleeps for approximately 3 hours per 24 hour day.  She does admit that she feels rested following awakening and denies any day time sleeping.  In the past I recommended Tylenol PM or Benadryl to help her sleep, but she failed to follow through with that recommendation.  The patient denies any other complaints today.   We spent some time going over a new Multiple Myeloma infusion that we would like to start her on.  This medication is called carfilzomib.  This treatment plan has been built in [place] and is now waiting Dr. Thornton Papas approval and e-signature.  I spent some time with the patient going over the medication which is a 4-10 infusion.  We also went over the risks, benefits, and alternatives to this medication.  We briefly went over side effects of the chemotherapy medication.      Past Medical History  Diagnosis Date  . Hypertension   . Coronary artery disease   . Diabetes mellitus     borderline  . GERD (gastroesophageal reflux disease)   . Cancer     colon: takes chemo  . Hypercholesteremia   . Multiple myeloma   . Female stress incontinence   . UTI (urinary  tract infection) 11/05/2010  . Vertigo 01/06/2011  . Poor sleep pattern 01/06/2011    has MULTIPLE  MYELOMA; HYPERLIPIDEMIA; ANEMIA; HYPERTENSION; Cor Athrscl-Uns Vessel; DYSRHYTHMIA, CARDIAC NOS; GASTROESOPHAGEAL REFLUX DISEASE; GASTROPARESIS; DYSPEPSIA; CONSTIPATION NOS; OVERACTIVE BLADDER; ALOPECIA; OSTEOARTHRITIS; ARTHRALGIA; LEG PAIN; VERTIGO; OTHER NONSPECIFIC FINDINGS EXAMINATION OF BLOOD; PROTEINURIA; HX, PERSONAL, MALIGNANCY, COLON; UTI (urinary tract infection); Hypertension; Vertigo; and Poor sleep pattern on her problem list.     is allergic to gabapentin and simvastatin.  Ms. Thornell does not currently have medications on file.  Past Surgical History  Procedure Date  . Colon surgery     resection, [facility]  . Abdominal hysterectomy 1970s    [facility]  . Knee arthroscopy     LEFT, [facility]  . Heel spur surgery     KEELING, [facility]  . Portacath placement     [facility]   . Cervical spine surgery 2004    [facility]  . Port-a-cath removal 11/05/2010    Procedure: REMOVAL PORT-A-CATH;  Surgeon: Dalia Heading;  Location: [facility];  Service: General;  Laterality: Left;  started at 0846  . Portacath placement 11/05/2010    Procedure: INSERTION PORT-A-CATH;  Surgeon: Dalia Heading;  Location: [facility];  Service: General;  Laterality: Right;  Right subclavian, device left accessed  Procedure done at 0844    Denies any headaches, dizziness, double vision, fevers, chills, night  sweats, nausea, vomiting, diarrhea, constipation, chest pain, heart palpitations, shortness of breath, blood in stool, black tarry stool, urinary pain, urinary burning, urinary frequency, hematuria.   PHYSICAL EXAMINATION  ECOG PERFORMANCE STATUS: 2 - Symptomatic, <50% confined to bed  Filed Vitals:   01/06/11 1044  BP: 156/93  Pulse: 68  Temp: 98.6 F (37 C)    GENERAL:alert, no distress, well nourished, well developed, comfortable, cooperative and smiling SKIN: skin color, texture, turgor are normal HEAD: Normocephalic EYES:  normal EARS: External ears normal OROPHARYNX:mucous membranes are moist  NECK: trachea midline LYMPH:  No epitrochlear nodes noted BREAST:not examined LUNGS: clear to auscultation and percussion HEART: regular rate & rhythm, no murmurs, no gallops, S1 normal and S2 normal ABDOMEN:abdomen soft, non-tender, obese and normal bowel sounds BACK: Back symmetric, no curvature. EXTREMITIES:less then 2 second capillary refill, no joint deformities, effusion, or inflammation, no edema, no skin discoloration, no clubbing, no cyanosis  NEURO: alert & oriented x 3 with fluent speech   RADIOGRAPHIC STUDIES:  12/02/10  *RADIOLOGY REPORT*  Clinical Data: Vertigo.  BILATERAL CAROTID DUPLEX ULTRASOUND  Technique: Wallace Cullens scale imaging, color Doppler and duplex ultrasound  was performed of bilateral carotid and vertebral arteries in the  neck.  Comparison: None.  Criteria: Quantification of carotid stenosis is based on velocity  parameters that correlate the residual internal carotid diameter  with NASCET-based stenosis levels, using the diameter of the distal  internal carotid lumen as the denominator for stenosis measurement.  The following velocity measurements were obtained:  PEAK SYSTOLIC/END DIASTOLIC  RIGHT  ICA: 126cm/sec  CCA: 66cm/sec  SYSTOLIC ICA/CCA RATIO: 1.9  DIASTOLIC ICA/CCA RATIO: 2.0  ECA: 78cm/sec  LEFT  ICA: 112cm/sec  CCA: 68cm/sec  SYSTOLIC ICA/CCA RATIO: 1.6  DIASTOLIC ICA/CCA RATIO: 2.5  ECA: 71cm/sec  Findings:  RIGHT CAROTID ARTERY: There is mild intimal thickening at the right  carotid bulb. No significant plaque or stenosis. The right  carotid arteries are patent. The distal right internal carotid  artery velocity is slightly elevated but this vessel is very  tortuous.  RIGHT VERTEBRAL ARTERY: Antegrade flow and normal waveform in the  right vertebral artery.  LEFT CAROTID ARTERY: The left carotid arteries are patent without  significant plaque or stenosis.  Mild intimal thickening in the  left carotid bulb. There is a small outpouching along the  posterior aspect of the carotid bulb. This finding raises concern  for a small penetrating ulcer but it could represent an unusual  branch vessel. This area is best seen on image number 59.  Antegrade flow in the internal carotid arteries without evidence of  stenosis. The distal left internal carotid artery is tortuous.  LEFT VERTEBRAL ARTERY: Antegrade flow and normal waveform in the  left vertebral artery.  IMPRESSION:  The internal carotid arteries are tortuous bilaterally. There is  no significant internal carotid artery stenosis. Minimal plaque or  intimal thickening at the carotid bulbs.  Outpouching along the posterior aspect of the left carotid bulb.  Based on these images, the findings could represent a penetrating  ulcer but a branch vessel cannot be excluded. The patient is  scheduled for additional images of the left carotid bulb and an  addendum will be made after these images have been acquired.  Original Report Authenticated By: Richarda Overlie, M.D.    ASSESSMENT:  1. Vertigo, followed by PCP, on Meclizine per PCP 2. Decreased sleep 3. 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 Recommend low sodium  diet and a decrease in salt intake.  2. Tylenol PM at HS as directed to help with sleep 3. Patient education regarding vertigo and its treatment and pathophysiology and etiology. 4. Patient education regarding the new multiple myeloma medication 5. Return to the clinic in one month which will be before cycle 2 of carfilzomib.   All questions were answered. The patient knows to call the clinic with any problems, questions or concerns. We can certainly see the patient much sooner if necessary.  More than 50% of the time spent with the patient was utilized for counseling.   Cortlynn Hollinsworth

## 2011-01-06 NOTE — Patient Instructions (Signed)
[facility]  Discharge Instructions  RECOMMENDATIONS MADE BY THE CONSULTANT AND ANY TEST RESULTS WILL BE SENT TO YOUR REFERRING DOCTOR.   EXAM FINDINGS BY MD TODAY AND SIGNS AND SYMPTOMS TO REPORT TO CLINIC OR PRIMARY MD: will start new chemotherapy hopefully next week.  MEDICATIONS PRESCRIBED: none     SPECIAL INSTRUCTIONS/FOLLOW-UP: Return to Clinic for chemotherapy when scheduled and in 1 month to see MD.   I acknowledge that I have been informed and understand all the instructions given to me and received a copy. I do not have any more questions at this time, but understand that I may call the Specialty Clinic at [facility] at [phone] during business hours should I have any further questions or need assistance in obtaining follow-up care.    __________________________________________  _____________  __________ Signature of Patient or Authorized Representative            Date                   Time    __________________________________________ Nurse's Signature

## 2011-01-07 ENCOUNTER — Inpatient Hospital Stay (HOSPITAL_COMMUNITY): Payer: Medicare HMO

## 2011-01-12 ENCOUNTER — Other Ambulatory Visit (HOSPITAL_COMMUNITY): Payer: Self-pay | Admitting: Family Medicine

## 2011-01-12 DIAGNOSIS — Z139 Encounter for screening, unspecified: Secondary | ICD-10-CM

## 2011-01-14 ENCOUNTER — Ambulatory Visit (HOSPITAL_COMMUNITY): Payer: Medicare HMO

## 2011-01-17 ENCOUNTER — Encounter: Payer: Self-pay | Admitting: Oncology

## 2011-01-18 ENCOUNTER — Other Ambulatory Visit (HOSPITAL_COMMUNITY): Payer: Self-pay | Admitting: Oncology

## 2011-01-18 DIAGNOSIS — C9000 Multiple myeloma not having achieved remission: Secondary | ICD-10-CM

## 2011-01-20 ENCOUNTER — Telehealth (HOSPITAL_COMMUNITY): Payer: Self-pay | Admitting: *Deleted

## 2011-01-20 ENCOUNTER — Encounter (HOSPITAL_BASED_OUTPATIENT_CLINIC_OR_DEPARTMENT_OTHER): Payer: Medicare HMO

## 2011-01-20 DIAGNOSIS — C9000 Multiple myeloma not having achieved remission: Secondary | ICD-10-CM

## 2011-01-20 DIAGNOSIS — E785 Hyperlipidemia, unspecified: Secondary | ICD-10-CM

## 2011-01-20 LAB — CBC
Hemoglobin: 10 g/dL — ABNORMAL LOW (ref 12.0–15.0)
MCH: 29.3 pg (ref 26.0–34.0)
MCV: 92.1 fL (ref 78.0–100.0)
Platelets: 191 10*3/uL (ref 150–400)
RBC: 3.41 MIL/uL — ABNORMAL LOW (ref 3.87–5.11)
WBC: 5.1 10*3/uL (ref 4.0–10.5)

## 2011-01-20 LAB — COMPREHENSIVE METABOLIC PANEL
Albumin: 3.8 g/dL (ref 3.5–5.2)
Alkaline Phosphatase: 35 U/L — ABNORMAL LOW (ref 39–117)
BUN: 11 mg/dL (ref 6–23)
CO2: 28 mEq/L (ref 19–32)
Chloride: 107 mEq/L (ref 96–112)
Creatinine, Ser: 1.45 mg/dL — ABNORMAL HIGH (ref 0.50–1.10)
GFR calc non Af Amer: 36 mL/min — ABNORMAL LOW (ref >60)
Potassium: 2.7 mEq/L — CL (ref 3.5–5.1)
Total Bilirubin: 0.4 mg/dL (ref 0.3–1.2)

## 2011-01-20 LAB — LIPID PANEL
Cholesterol: 150 mg/dL (ref 0–200)
Triglycerides: 99 mg/dL (ref <150)
VLDL: 20 mg/dL (ref 0–40)

## 2011-01-20 LAB — DIFFERENTIAL
Eosinophils Absolute: 0.2 10*3/uL (ref 0.0–0.7)
Eosinophils Relative: 4 % (ref 0–5)
Lymphocytes Relative: 20 % (ref 12–46)
Lymphs Abs: 1 10*3/uL (ref 0.7–4.0)
Monocytes Relative: 10 % (ref 3–12)

## 2011-01-20 MED ORDER — SODIUM CHLORIDE 0.9 % IJ SOLN
INTRAMUSCULAR | Status: AC
  Administered 2011-01-20: 10 mL
  Filled 2011-01-20: qty 10

## 2011-01-20 MED ORDER — HEPARIN SOD (PORK) LOCK FLUSH 100 UNIT/ML IV SOLN
INTRAVENOUS | Status: AC
  Administered 2011-01-20: 500 [IU]
  Filled 2011-01-20: qty 5

## 2011-01-20 NOTE — Progress Notes (Signed)
[name] presented for Portacath access and flush. Proper placement of portacath confirmed by CXR. Portacath located RT chest wall accessed with  PH 20 needle. Good blood return present. Portacath flushed with 20ml NS and 500U/31ml Heparin and needle removed intact. Procedure without incident. Patient tolerated procedure well.  LABS drawn per Dr. Janna Arch and Dr. Mariel Sleet orders.

## 2011-01-20 NOTE — Telephone Encounter (Signed)
Patient notified of the need to restart klor con and told her that she would be taking klor con tid x 10 days then bid. Return to clinic on day of Neijstrom's visit and have BMET drawn. Patient verbalized understanding.  Klor con tid x 10 days then bid thereafter. #60 1 refill called into [facility]. Patient notified.

## 2011-01-21 LAB — KAPPA/LAMBDA LIGHT CHAINS
Kappa free light chain: 1.87 mg/dL (ref 0.33–1.94)
Kappa, lambda light chain ratio: 0.35 (ref 0.26–1.65)
Lambda free light chains: 5.33 mg/dL — ABNORMAL HIGH (ref 0.57–2.63)

## 2011-01-24 LAB — MULTIPLE MYELOMA PANEL, SERUM
Albumin ELP: 54.8 % — ABNORMAL LOW (ref 55.8–66.1)
Alpha-2-Globulin: 12.7 % — ABNORMAL HIGH (ref 7.1–11.8)
Beta 2: 4.8 % (ref 3.2–6.5)
Beta Globulin: 5 % (ref 4.7–7.2)
IgA: 57 mg/dL — ABNORMAL LOW (ref 69–380)
IgM, Serum: 33 mg/dL — ABNORMAL LOW (ref 52–322)
M-Spike, %: 0.57 g/dL
Total Protein: 6.9 g/dL (ref 6.0–8.3)

## 2011-01-25 LAB — LACTATE DEHYDROGENASE: LDH: 144

## 2011-01-25 LAB — BETA 2 MICROGLOBULIN, SERUM: Beta-2 Microglobulin: 1.82 — ABNORMAL HIGH

## 2011-02-02 ENCOUNTER — Encounter (HOSPITAL_COMMUNITY): Payer: Medicare HMO | Attending: Oncology | Admitting: Oncology

## 2011-02-02 ENCOUNTER — Telehealth (HOSPITAL_COMMUNITY): Payer: Self-pay | Admitting: *Deleted

## 2011-02-02 ENCOUNTER — Encounter (HOSPITAL_COMMUNITY): Payer: Medicare HMO

## 2011-02-02 VITALS — BP 143/87 | HR 63 | Temp 99.3°F | Wt 196.0 lb

## 2011-02-02 DIAGNOSIS — C9000 Multiple myeloma not having achieved remission: Secondary | ICD-10-CM

## 2011-02-02 NOTE — Telephone Encounter (Signed)
Do you want [name] taking the Zovirax and Decadron that is ordered as medications to be prescribed under the pre-tx plan of the chemo plan?

## 2011-02-02 NOTE — Progress Notes (Signed)
CC:   Delbert Harness, MD  CSN #[id number]  DIAGNOSIS:  Status IgG lambda multiple myeloma with 2 M spikes.  She presented initially as an MGUS in 1997 but evolved into myeloma within the last several years.  She has been treated with Revlimid and dexamethasone but developed Revlimid-induced skin toxicity with contraction of her skin of the legs as well as significant peripheral neuropathies.  That is down to a grade 1 neuropathy now.  Next, she was treated with Velcade and developed more neuropathic symptomatology to the point that she could not walk and was always coming up here in a wheelchair.  Could hardly get around at home.  She has been most recently treated with Cytoxan and dexamethasone.  That was marginally effective at best and are about to start carfilzomib in the next couple weeks namely 02/14/2011.  Her treatment regimen has been set up.  She is here today to talk and be examined.  Her sense of well-being is just so much better she states since stopping the Cytoxan and the dexamethasone.  She has no nausea and no vomiting. Energy is back.  She walked up here today for one of the few times in the last several months.  She is sleeping better.  The numbness in her feet and hands is still there but at least it is stable at grade 1 at this time.  Her bowel function is fine.  She just cannot tell me how much better she feels she states.  PHYSICAL EXAMINATION:  Vital Signs:  Her weight today is 196 pounds, blood pressure 143/87 in the right arm sitting position, pulse 60 to 64 and regular, respirations 16 to 18 and unlabored, and she has a temperature of 98.3 degrees.  Skin:  Warm and dry to touch.  General: She is in no acute distress.  She still has some of the contracted skin changes on her lower legs but no edema.  Abdomen:  Soft, nontender, with normal bowel sounds.  Heart:  Regular rhythm and rate without murmur, rub, or gallop.  Port is intact in the right upper chest  wall.  She has no adenopathy in any location.  Lungs:  Clear to auscultation and percussion.  She is alert and very oriented.  She is due for mammography, she states, in a week so that will be fine. We will see her on the 22nd and we will draw our baseline blood work then and every 28 days; namely, CBC, diff, CMET, and a myeloma panel which includes the light chains and the SPEP, and quantitative IFE.  We will see her in a month.  I will get Tom (PA) to see her and then I will see her in 2 months.  We will see how this goes for 4 cycles.  But, she really is delighted at how good she feels lately.    ______________________________ Ladona Horns. Mariel Sleet, MD ESN/MEDQ  D:  02/02/2011  T:  02/02/2011  Job:  [id number]

## 2011-02-02 NOTE — Patient Instructions (Signed)
[facility]  Discharge Instructions  RECOMMENDATIONS MADE BY THE CONSULTANT AND ANY TEST RESULTS WILL BE SENT TO YOUR REFERRING DOCTOR.   EXAM FINDINGS BY MD TODAY AND SIGNS AND SYMPTOMS TO REPORT TO CLINIC OR PRIMARY MD: you look great .  Will start new chemotherapy on Monday.  MEDICATIONS PRESCRIBED: Rolly Salter will take care of meds that you will need to take    SPECIAL INSTRUCTIONS/FOLLOW-UP: Other (Referral/Appointments) Labs will be done with each chemo cycle.  You'll see Tom in 4 weeks and Dr. Mariel Sleet in 8 weeks.   I acknowledge that I have been informed and understand all the instructions given to me and received a copy. I do not have any more questions at this time, but understand that I may call the Specialty Clinic at [facility] at [phone] during business hours should I have any further questions or need assistance in obtaining follow-up care.    __________________________________________  _____________  __________ Signature of Patient or Authorized Representative            Date                   Time    __________________________________________ Nurse's Signature

## 2011-02-02 NOTE — Telephone Encounter (Signed)
[name] does not have a hx of fever blisters/shingles.

## 2011-02-02 NOTE — Progress Notes (Signed)
This office note has been dictated.

## 2011-02-04 ENCOUNTER — Telehealth (HOSPITAL_COMMUNITY): Payer: Self-pay | Admitting: *Deleted

## 2011-02-04 ENCOUNTER — Other Ambulatory Visit (HOSPITAL_COMMUNITY): Payer: Medicare HMO

## 2011-02-04 ENCOUNTER — Encounter (HOSPITAL_BASED_OUTPATIENT_CLINIC_OR_DEPARTMENT_OTHER): Payer: Medicare HMO

## 2011-02-04 DIAGNOSIS — C9000 Multiple myeloma not having achieved remission: Secondary | ICD-10-CM

## 2011-02-04 DIAGNOSIS — Z452 Encounter for adjustment and management of vascular access device: Secondary | ICD-10-CM

## 2011-02-04 LAB — CBC
MCV: 92.8 fL (ref 78.0–100.0)
Platelets: 203 10*3/uL (ref 150–400)
RDW: 14.4 % (ref 11.5–15.5)
WBC: 4.1 10*3/uL (ref 4.0–10.5)

## 2011-02-04 LAB — DIFFERENTIAL
Basophils Absolute: 0 10*3/uL (ref 0.0–0.1)
Basophils Relative: 0 % (ref 0–1)
Eosinophils Relative: 5 % (ref 0–5)
Lymphocytes Relative: 24 % (ref 12–46)
Neutro Abs: 2.4 10*3/uL (ref 1.7–7.7)

## 2011-02-04 MED ORDER — HEPARIN SOD (PORK) LOCK FLUSH 100 UNIT/ML IV SOLN
500.0000 [IU] | Freq: Once | INTRAVENOUS | Status: AC
  Administered 2011-02-04: 500 [IU] via INTRAVENOUS
  Filled 2011-02-04: qty 5

## 2011-02-04 MED ORDER — DEXAMETHASONE 4 MG PO TABS: ORAL_TABLET | ORAL | Status: DC

## 2011-02-04 MED ORDER — ACYCLOVIR 400 MG PO TABS: 400.0000 mg | ORAL_TABLET | Freq: Every day | ORAL | Status: DC

## 2011-02-04 MED ORDER — SODIUM CHLORIDE 0.9 % IJ SOLN
10.0000 mL | INTRAMUSCULAR | Status: DC | PRN
  Administered 2011-02-04: 10 mL via INTRAVENOUS
  Filled 2011-02-04: qty 10

## 2011-02-04 MED ORDER — SODIUM CHLORIDE 0.9 % IJ SOLN
INTRAMUSCULAR | Status: AC
  Filled 2011-02-04: qty 10

## 2011-02-04 MED ORDER — LORAZEPAM 0.5 MG PO TABS: 0.5000 mg | ORAL_TABLET | Freq: Four times a day (QID) | ORAL | Status: AC | PRN

## 2011-02-04 MED ORDER — HEPARIN SOD (PORK) LOCK FLUSH 100 UNIT/ML IV SOLN
INTRAVENOUS | Status: AC
  Filled 2011-02-04: qty 5

## 2011-02-04 MED ORDER — PROCHLORPERAZINE 25 MG RE SUPP: 25.0000 mg | Freq: Four times a day (QID) | RECTAL | Status: DC | PRN

## 2011-02-04 MED ORDER — ONDANSETRON HCL 8 MG PO TABS: ORAL_TABLET | ORAL | Status: DC

## 2011-02-04 NOTE — Progress Notes (Signed)
Addended by: Oda Kilts on: 02/04/2011 11:53 AM   Modules accepted: Orders

## 2011-02-04 NOTE — Progress Notes (Signed)
Good blood return. Tolerated well. 

## 2011-02-04 NOTE — Progress Notes (Signed)
Patient's mail order pharmacy is [facility] [phone].

## 2011-02-08 LAB — MULTIPLE MYELOMA PANEL, SERUM
Albumin ELP: 58 % (ref 55.8–66.1)
Alpha-2-Globulin: 11.5 % (ref 7.1–11.8)
Beta Globulin: 4.8 % (ref 4.7–7.2)
IgA: 53 mg/dL — ABNORMAL LOW (ref 69–380)
IgG (Immunoglobin G), Serum: 1410 mg/dL (ref 690–1700)
M-Spike, %: 0.57 g/dL
Total Protein: 7.4 g/dL (ref 6.0–8.3)

## 2011-02-08 NOTE — Telephone Encounter (Signed)
meds called into [facility]

## 2011-02-14 ENCOUNTER — Ambulatory Visit (HOSPITAL_COMMUNITY): Payer: Medicare HMO

## 2011-02-14 ENCOUNTER — Encounter (HOSPITAL_BASED_OUTPATIENT_CLINIC_OR_DEPARTMENT_OTHER): Payer: Medicare HMO

## 2011-02-14 VITALS — BP 119/77 | HR 61 | Temp 98.3°F | Ht 65.0 in | Wt 192.6 lb

## 2011-02-14 DIAGNOSIS — Z5112 Encounter for antineoplastic immunotherapy: Secondary | ICD-10-CM

## 2011-02-14 DIAGNOSIS — C9000 Multiple myeloma not having achieved remission: Secondary | ICD-10-CM

## 2011-02-14 LAB — DIFFERENTIAL
Basophils Absolute: 0 10*3/uL (ref 0.0–0.1)
Basophils Relative: 0 % (ref 0–1)
Eosinophils Absolute: 0.2 10*3/uL (ref 0.0–0.7)
Eosinophils Relative: 5 % (ref 0–5)
Lymphocytes Relative: 28 % (ref 12–46)
Monocytes Absolute: 0.4 10*3/uL (ref 0.1–1.0)

## 2011-02-14 LAB — CBC
MCH: 29.6 pg (ref 26.0–34.0)
MCHC: 32.2 g/dL (ref 30.0–36.0)
MCV: 91.8 fL (ref 78.0–100.0)
Platelets: 176 10*3/uL (ref 150–400)
RDW: 14.3 % (ref 11.5–15.5)
WBC: 3.7 10*3/uL — ABNORMAL LOW (ref 4.0–10.5)

## 2011-02-14 MED ORDER — ONDANSETRON 8 MG/50ML IVPB (CHCC): 8.0000 mg | Freq: Once | INTRAVENOUS | Status: DC

## 2011-02-14 MED ORDER — SODIUM CHLORIDE 0.9 % IV SOLN
Freq: Once | INTRAVENOUS | Status: AC
  Administered 2011-02-14: 500 mL via INTRAVENOUS

## 2011-02-14 MED ORDER — DEXAMETHASONE SODIUM PHOSPHATE 10 MG/ML IJ SOLN: 10.0000 mg | Freq: Once | INTRAMUSCULAR | Status: DC

## 2011-02-14 MED ORDER — HEPARIN SOD (PORK) LOCK FLUSH 100 UNIT/ML IV SOLN
500.0000 [IU] | Freq: Once | INTRAVENOUS | Status: AC | PRN
  Administered 2011-02-14: 500 [IU]
  Filled 2011-02-14: qty 5

## 2011-02-14 MED ORDER — SODIUM CHLORIDE 0.9 % IJ SOLN
INTRAMUSCULAR | Status: AC
  Administered 2011-02-14: 10 mL
  Filled 2011-02-14: qty 10

## 2011-02-14 MED ORDER — HEPARIN SOD (PORK) LOCK FLUSH 100 UNIT/ML IV SOLN
INTRAVENOUS | Status: AC
  Administered 2011-02-14: 500 [IU]
  Filled 2011-02-14: qty 5

## 2011-02-14 MED ORDER — SODIUM CHLORIDE 0.9 % IJ SOLN
10.0000 mL | INTRAMUSCULAR | Status: DC | PRN
  Administered 2011-02-14: 10 mL
  Filled 2011-02-14: qty 10

## 2011-02-14 MED ORDER — SODIUM CHLORIDE 0.9 % IV SOLN
Freq: Once | INTRAVENOUS | Status: AC
  Administered 2011-02-14: 8 mg via INTRAVENOUS
  Filled 2011-02-14: qty 4

## 2011-02-14 MED ORDER — DEXTROSE 5 % IV SOLN
20.0000 mg/m2 | Freq: Once | INTRAVENOUS | Status: AC
  Administered 2011-02-14: 40 mg via INTRAVENOUS
  Filled 2011-02-14: qty 20

## 2011-02-14 NOTE — Progress Notes (Signed)
[facility] Discharge Instructions for Patients Receiving Chemotherapy  Today you received the following chemotherapy agents Kyprolis. You will get this  Every Monday and Tuesday for 3 weeks.   To help prevent nausea and vomiting after your treatment, we encourage you to take your nausea medication. 1. Take Zofran 8 mg twice a day for 2 days on Wednesday and Thursday. 2. Ativan 1mg  every 3 to 4 hours as needed for nausea or vomiting.  3.You have compazine suppository you can use for chemo also. You can take it every 6 hrs as needed for nausea.  Take Zovirax 400 mg daily.   If you have fever, chills or flu-like symptoms you can take one or 2 tylenol pm tablets. If this does not relieve it or if you have shortness of breath report to emergency dept.   If you develop nausea and vomiting that is not controlled by your nausea medication, call the clinic. If it is after clinic hours your family physician or the after hours number for the clinic or go to the Emergency Department.   BELOW ARE SYMPTOMS THAT SHOULD BE REPORTED IMMEDIATELY:  *FEVER GREATER THAN 101.0 F  *CHILLS WITH OR WITHOUT FEVER  NAUSEA AND VOMITING THAT IS NOT CONTROLLED WITH YOUR NAUSEA MEDICATION  *UNUSUAL SHORTNESS OF BREATH  *UNUSUAL BRUISING OR BLEEDING  TENDERNESS IN MOUTH AND THROAT WITH OR WITHOUT PRESENCE OF ULCERS  *URINARY PROBLEMS  *BOWEL PROBLEMS  UNUSUAL RASH Items with * indicate a potential emergency and should be followed up as soon as possible.  One of the nurses will contact you 24 hours after your treatment. Please let the nurse know about any problems that you may have experienced. Feel free to call the clinic you have any questions or concerns. The clinic phone number is [phone].   I have been informed and understand all the instructions given to me. I know to contact the clinic, my physician, or go to the Emergency Department if any problems should occur. I do not have any  questions at this time, but understand that I may call the clinic during office hours or the Patient Navigator at [phone] should I have any questions or need assistance in obtaining follow up care.    __________________________________________  _____________  __________ Signature of Patient or Authorized Representative            Date                   Time    __________________________________________ Nurse's Signature

## 2011-02-14 NOTE — Progress Notes (Signed)
kyprolis teaching sheet off of the internet given this day but not the actual [facility] pt education sheet. So this was given today 10/22 for patient to sign.

## 2011-02-14 NOTE — Patient Instructions (Signed)
[facility]   CHEMOTHERAPY INSTRUCTIONS Kyprolis  POTENTIAL SIDE EFFECTS OF TREATMENT: Increased Susceptibility to Infection and Bone Marrow Suppression    EDUCATIONAL MATERIALS GIVEN AND REVIEWED: Kyprolis teaching sheet from the internet   SELF CARE ACTIVITIES WHILE ON CHEMOTHERAPY: Increase your fluid intake 48 hours prior to treatment and drink at least 2 quarts per day after treatment., No alcohol intake., No aspirin or other medications unless approved by your oncologist., Eat foods that are light and easy to digest., Eat foods at cold or room temperature., No fried, fatty, or spicy foods immediately before or after treatment., Have teeth cleaned professionally before starting treatment. Keep dentures and partial plates clean., Use soft toothbrush and do not use mouthwashes that contain alcohol. Biotene is a good mouthwash that is available at most pharmacies or may be ordered by calling [phone]., Use warm salt water gargles (1 teaspoon salt per 1 quart warm water) before and after meals and at bedtime. Or you may rinse with 2 tablespoons of three -percent hydrogen peroxide mixed in eight ounces of water., Always use sunscreen with SPF (Sun Protection Factor) of 15 or higher. and Use your nausea medication as directed to prevent nausea.   MEDICATIONS: You have been given prescriptions for the following medications:  Zofran 8mg  (starting the day after chemo, take 1 tab twice a day x 2 days, then only take twice a day if needed for nausea/vomiting)  Zovirax (acyclovir) 400mg  (take 1 tab daily). You will take this the entire time you are on chemo. This is to prevent an outbreak of shingles/fever blisters.    SYMPTOMS TO REPORT AS SOON AS POSSIBLE AFTER TREATMENT:  FEVER GREATER THAN 101.0 F  CHILLS WITH OR WITHOUT FEVER  NAUSEA AND VOMITING THAT IS NOT CONTROLLED WITH YOUR NAUSEA MEDICATION  UNUSUAL SHORTNESS OF BREATH  UNUSUAL  BRUISING OR BLEEDING  TENDERNESS IN MOUTH AND THROAT WITH OR WITHOUT PRESENCE OF ULCERS  URINARY PROBLEMS  BOWEL PROBLEMS  UNUSUAL RASH    Wear comfortable clothing and clothing appropriate for easy access to any Portacath or PICC line. Let us know if there is anything that we can do to make your therapy better!      I have been informed and understand all of the instructions given to me and have received a copy. I have been instructed to call the clinic [phone] or my family physician as soon as possible for continued medical care, if indicated. I do not have any more questions at this time but understand that I may call the Cancer Center or the Patient Navigator at [phone] during office hours should I have questions or need assistance in obtaining follow-up care.      _________________________________________      _______________     __________ Signature of Patient or Authorized Representative        Date                            Time      _________________________________________ Nurse's Signature

## 2011-02-14 NOTE — Progress Notes (Signed)
Chemo teaching done this day.

## 2011-02-15 ENCOUNTER — Encounter (HOSPITAL_BASED_OUTPATIENT_CLINIC_OR_DEPARTMENT_OTHER): Payer: Medicare HMO

## 2011-02-15 VITALS — BP 157/82 | HR 56 | Temp 98.5°F | Ht 65.0 in | Wt 196.4 lb

## 2011-02-15 DIAGNOSIS — Z5112 Encounter for antineoplastic immunotherapy: Secondary | ICD-10-CM

## 2011-02-15 DIAGNOSIS — C9000 Multiple myeloma not having achieved remission: Secondary | ICD-10-CM

## 2011-02-15 MED ORDER — SODIUM CHLORIDE 0.9 % IJ SOLN
10.0000 mL | INTRAMUSCULAR | Status: DC | PRN
  Administered 2011-02-15: 10 mL
  Filled 2011-02-15: qty 10

## 2011-02-15 MED ORDER — ONDANSETRON 8 MG/50ML IVPB (CHCC): 8.0000 mg | Freq: Once | INTRAVENOUS | Status: DC

## 2011-02-15 MED ORDER — DEXAMETHASONE SODIUM PHOSPHATE 10 MG/ML IJ SOLN: 10.0000 mg | Freq: Once | INTRAMUSCULAR | Status: DC

## 2011-02-15 MED ORDER — HEPARIN SOD (PORK) LOCK FLUSH 100 UNIT/ML IV SOLN
INTRAVENOUS | Status: AC
  Filled 2011-02-15: qty 5

## 2011-02-15 MED ORDER — SODIUM CHLORIDE 0.9 % IJ SOLN
INTRAMUSCULAR | Status: AC
  Filled 2011-02-15: qty 10

## 2011-02-15 MED ORDER — HEPARIN SOD (PORK) LOCK FLUSH 100 UNIT/ML IV SOLN
500.0000 [IU] | Freq: Once | INTRAVENOUS | Status: AC | PRN
  Administered 2011-02-15: 500 [IU]
  Filled 2011-02-15: qty 5

## 2011-02-15 MED ORDER — DEXTROSE 5 % IV SOLN
20.0000 mg/m2 | Freq: Once | INTRAVENOUS | Status: AC
  Administered 2011-02-15: 40 mg via INTRAVENOUS
  Filled 2011-02-15: qty 20

## 2011-02-15 MED ORDER — SODIUM CHLORIDE 0.9 % IV SOLN
Freq: Once | INTRAVENOUS | Status: AC
  Administered 2011-02-15: 500 mL via INTRAVENOUS

## 2011-02-15 MED ORDER — SODIUM CHLORIDE 0.9 % IV SOLN
Freq: Once | INTRAVENOUS | Status: AC
  Administered 2011-02-15: 8 mg via INTRAVENOUS
  Filled 2011-02-15: qty 4

## 2011-02-15 NOTE — Progress Notes (Signed)
Tolerated well

## 2011-02-16 ENCOUNTER — Telehealth (HOSPITAL_COMMUNITY): Payer: Self-pay

## 2011-02-16 LAB — MULTIPLE MYELOMA PANEL, SERUM
Gamma Globulin: 18.1 % (ref 11.1–18.8)
IgA: 57 mg/dL — ABNORMAL LOW (ref 69–380)
IgG (Immunoglobin G), Serum: 1480 mg/dL (ref 690–1700)
M-Spike, %: 0.59 g/dL
Total Protein: 7.1 g/dL (ref 6.0–8.3)

## 2011-02-16 NOTE — Telephone Encounter (Signed)
Post chemotherapy follow-up:  Per patient "I'm doing good.  Have to drink a lot of fluids with it but I haven't had any swelling and feel pretty good."  Knows to call if problems occur.

## 2011-02-18 ENCOUNTER — Ambulatory Visit (HOSPITAL_COMMUNITY)
Admission: RE | Admit: 2011-02-18 | Discharge: 2011-02-18 | Disposition: A | Discharge status: Home / Self Care | Payer: Medicare HMO | Source: Ambulatory Visit | Attending: Family Medicine | Admitting: Family Medicine

## 2011-02-18 DIAGNOSIS — Z1231 Encounter for screening mammogram for malignant neoplasm of breast: Secondary | ICD-10-CM | POA: Insufficient documentation

## 2011-02-18 DIAGNOSIS — Z139 Encounter for screening, unspecified: Secondary | ICD-10-CM

## 2011-02-21 ENCOUNTER — Encounter (HOSPITAL_BASED_OUTPATIENT_CLINIC_OR_DEPARTMENT_OTHER): Payer: Medicare HMO

## 2011-02-21 ENCOUNTER — Encounter: Payer: Self-pay | Admitting: Oncology

## 2011-02-21 VITALS — BP 123/78 | HR 78 | Temp 97.5°F | Wt 190.4 lb

## 2011-02-21 DIAGNOSIS — Z5111 Encounter for antineoplastic chemotherapy: Secondary | ICD-10-CM

## 2011-02-21 DIAGNOSIS — C9000 Multiple myeloma not having achieved remission: Secondary | ICD-10-CM

## 2011-02-21 DIAGNOSIS — Z5112 Encounter for antineoplastic immunotherapy: Secondary | ICD-10-CM

## 2011-02-21 LAB — CBC
MCV: 90.6 fL (ref 78.0–100.0)
Platelets: 136 10*3/uL — ABNORMAL LOW (ref 150–400)
RDW: 14.3 % (ref 11.5–15.5)
WBC: 4.7 10*3/uL (ref 4.0–10.5)

## 2011-02-21 LAB — DIFFERENTIAL
Basophils Absolute: 0 10*3/uL (ref 0.0–0.1)
Basophils Relative: 0 % (ref 0–1)
Eosinophils Absolute: 0.1 10*3/uL (ref 0.0–0.7)
Eosinophils Relative: 3 % (ref 0–5)
Lymphocytes Relative: 21 % (ref 12–46)

## 2011-02-21 MED ORDER — HEPARIN SOD (PORK) LOCK FLUSH 100 UNIT/ML IV SOLN
500.0000 [IU] | Freq: Once | INTRAVENOUS | Status: AC | PRN
  Administered 2011-02-21: 500 [IU]
  Filled 2011-02-21: qty 5

## 2011-02-21 MED ORDER — ONDANSETRON 8 MG/50ML IVPB (CHCC): 8.0000 mg | Freq: Once | INTRAVENOUS | Status: DC

## 2011-02-21 MED ORDER — SODIUM CHLORIDE 0.9 % IV SOLN
Freq: Once | INTRAVENOUS | Status: AC
  Administered 2011-02-21: 10:00:00 via INTRAVENOUS

## 2011-02-21 MED ORDER — DEXAMETHASONE SODIUM PHOSPHATE 10 MG/ML IJ SOLN: 10.0000 mg | Freq: Once | INTRAMUSCULAR | Status: DC

## 2011-02-21 MED ORDER — HEPARIN SOD (PORK) LOCK FLUSH 100 UNIT/ML IV SOLN
INTRAVENOUS | Status: AC
  Administered 2011-02-21: 500 [IU]
  Filled 2011-02-21: qty 5

## 2011-02-21 MED ORDER — ONDANSETRON HCL 4 MG/2ML IJ SOLN
Freq: Once | INTRAMUSCULAR | Status: AC
  Administered 2011-02-21: 8 mg via INTRAVENOUS
  Filled 2011-02-21: qty 4

## 2011-02-21 MED ORDER — SODIUM CHLORIDE 0.9 % IV SOLN: Freq: Once | INTRAVENOUS | Status: DC

## 2011-02-21 MED ORDER — DEXTROSE 5 % IV SOLN
20.0000 mg/m2 | Freq: Once | INTRAVENOUS | Status: AC
  Administered 2011-02-21: 40 mg via INTRAVENOUS
  Filled 2011-02-21: qty 20

## 2011-02-22 ENCOUNTER — Other Ambulatory Visit (HOSPITAL_COMMUNITY): Payer: Self-pay | Admitting: Oncology

## 2011-02-22 ENCOUNTER — Telehealth (HOSPITAL_COMMUNITY): Payer: Self-pay

## 2011-02-22 ENCOUNTER — Encounter (HOSPITAL_BASED_OUTPATIENT_CLINIC_OR_DEPARTMENT_OTHER): Payer: Medicare HMO

## 2011-02-22 VITALS — BP 123/84 | HR 69 | Temp 98.3°F | Wt 193.0 lb

## 2011-02-22 DIAGNOSIS — C9000 Multiple myeloma not having achieved remission: Secondary | ICD-10-CM

## 2011-02-22 DIAGNOSIS — Z5112 Encounter for antineoplastic immunotherapy: Secondary | ICD-10-CM

## 2011-02-22 MED ORDER — HEPARIN SOD (PORK) LOCK FLUSH 100 UNIT/ML IV SOLN
INTRAVENOUS | Status: AC
  Administered 2011-02-22: 500 [IU]
  Filled 2011-02-22: qty 5

## 2011-02-22 MED ORDER — SODIUM CHLORIDE 0.9 % IV SOLN
Freq: Once | INTRAVENOUS | Status: AC
  Administered 2011-02-22: 09:00:00 via INTRAVENOUS

## 2011-02-22 MED ORDER — DEXAMETHASONE SODIUM PHOSPHATE 10 MG/ML IJ SOLN: 10.0000 mg | Freq: Once | INTRAMUSCULAR | Status: DC

## 2011-02-22 MED ORDER — DEXTROSE 5 % IV SOLN
20.0000 mg/m2 | Freq: Once | INTRAVENOUS | Status: AC
  Administered 2011-02-22: 40 mg via INTRAVENOUS
  Filled 2011-02-22: qty 20

## 2011-02-22 MED ORDER — HEPARIN SOD (PORK) LOCK FLUSH 100 UNIT/ML IV SOLN
500.0000 [IU] | Freq: Once | INTRAVENOUS | Status: AC | PRN
  Administered 2011-02-22: 500 [IU]
  Filled 2011-02-22: qty 5

## 2011-02-22 MED ORDER — SODIUM CHLORIDE 0.9 % IV SOLN
Freq: Once | INTRAVENOUS | Status: AC
  Administered 2011-02-22: 8 mg via INTRAVENOUS
  Filled 2011-02-22: qty 4

## 2011-02-22 MED ORDER — ONDANSETRON 8 MG/50ML IVPB (CHCC): 8.0000 mg | Freq: Once | INTRAVENOUS | Status: DC

## 2011-02-22 NOTE — Telephone Encounter (Signed)
Chemotherapy follow-up:  No problems experienced.  Doing well.

## 2011-02-28 ENCOUNTER — Encounter (HOSPITAL_COMMUNITY): Payer: Medicare HMO | Attending: Oncology

## 2011-02-28 VITALS — BP 139/83 | HR 76 | Temp 98.5°F | Ht 65.0 in | Wt 196.0 lb

## 2011-02-28 DIAGNOSIS — C9000 Multiple myeloma not having achieved remission: Secondary | ICD-10-CM | POA: Insufficient documentation

## 2011-02-28 DIAGNOSIS — Z5112 Encounter for antineoplastic immunotherapy: Secondary | ICD-10-CM

## 2011-02-28 LAB — DIFFERENTIAL
Basophils Absolute: 0 10*3/uL (ref 0.0–0.1)
Lymphocytes Relative: 10 % — ABNORMAL LOW (ref 12–46)
Neutro Abs: 6 10*3/uL (ref 1.7–7.7)
Neutrophils Relative %: 81 % — ABNORMAL HIGH (ref 43–77)

## 2011-02-28 LAB — CBC
HCT: 28.6 % — ABNORMAL LOW (ref 36.0–46.0)
Platelets: 137 10*3/uL — ABNORMAL LOW (ref 150–400)
RDW: 15.4 % (ref 11.5–15.5)
WBC: 7.5 10*3/uL (ref 4.0–10.5)

## 2011-02-28 MED ORDER — HEPARIN SOD (PORK) LOCK FLUSH 100 UNIT/ML IV SOLN
INTRAVENOUS | Status: AC
  Administered 2011-02-28: 500 [IU]
  Filled 2011-02-28: qty 5

## 2011-02-28 MED ORDER — DEXAMETHASONE SODIUM PHOSPHATE 10 MG/ML IJ SOLN: 10.0000 mg | Freq: Once | INTRAMUSCULAR | Status: DC

## 2011-02-28 MED ORDER — SODIUM CHLORIDE 0.9 % IV SOLN: Freq: Once | INTRAVENOUS | Status: DC

## 2011-02-28 MED ORDER — SODIUM CHLORIDE 0.9 % IJ SOLN
10.0000 mL | INTRAMUSCULAR | Status: DC | PRN
  Administered 2011-02-28: 10 mL
  Filled 2011-02-28: qty 10

## 2011-02-28 MED ORDER — SODIUM CHLORIDE 0.9 % IJ SOLN
INTRAMUSCULAR | Status: AC
  Administered 2011-02-28: 10 mL
  Filled 2011-02-28: qty 10

## 2011-02-28 MED ORDER — SODIUM CHLORIDE 0.9 % IV SOLN
Freq: Once | INTRAVENOUS | Status: AC
  Administered 2011-02-28: 8 mg via INTRAVENOUS
  Filled 2011-02-28: qty 4

## 2011-02-28 MED ORDER — HEPARIN SOD (PORK) LOCK FLUSH 100 UNIT/ML IV SOLN
500.0000 [IU] | Freq: Once | INTRAVENOUS | Status: AC | PRN
  Administered 2011-02-28: 500 [IU]
  Filled 2011-02-28: qty 5

## 2011-02-28 MED ORDER — SODIUM CHLORIDE 0.9 % IV SOLN
Freq: Once | INTRAVENOUS | Status: AC
  Administered 2011-02-28: 1000 mL via INTRAVENOUS

## 2011-02-28 MED ORDER — ONDANSETRON 8 MG/50ML IVPB (CHCC): 8.0000 mg | Freq: Once | INTRAVENOUS | Status: DC

## 2011-02-28 MED ORDER — DEXTROSE 5 % IV SOLN
20.0000 mg/m2 | Freq: Once | INTRAVENOUS | Status: AC
  Administered 2011-02-28: 40 mg via INTRAVENOUS
  Filled 2011-02-28: qty 20

## 2011-02-28 NOTE — Progress Notes (Signed)
Tolerated infusion well to return tomorrow.

## 2011-03-01 ENCOUNTER — Encounter (HOSPITAL_BASED_OUTPATIENT_CLINIC_OR_DEPARTMENT_OTHER): Payer: Medicare HMO

## 2011-03-01 VITALS — BP 176/83 | HR 58 | Temp 98.7°F | Ht 65.0 in | Wt 199.0 lb

## 2011-03-01 DIAGNOSIS — Z5112 Encounter for antineoplastic immunotherapy: Secondary | ICD-10-CM

## 2011-03-01 DIAGNOSIS — C9000 Multiple myeloma not having achieved remission: Secondary | ICD-10-CM

## 2011-03-01 MED ORDER — DEXTROSE 5 % IV SOLN
20.0000 mg/m2 | Freq: Once | INTRAVENOUS | Status: AC
  Administered 2011-03-01: 40 mg via INTRAVENOUS
  Filled 2011-03-01: qty 20

## 2011-03-01 MED ORDER — HEPARIN SOD (PORK) LOCK FLUSH 100 UNIT/ML IV SOLN
INTRAVENOUS | Status: AC
  Administered 2011-03-01: 500 [IU]
  Filled 2011-03-01: qty 5

## 2011-03-01 MED ORDER — SODIUM CHLORIDE 0.9 % IJ SOLN
10.0000 mL | INTRAMUSCULAR | Status: DC | PRN
  Administered 2011-03-01: 10 mL
  Filled 2011-03-01: qty 10

## 2011-03-01 MED ORDER — HEPARIN SOD (PORK) LOCK FLUSH 100 UNIT/ML IV SOLN
500.0000 [IU] | Freq: Once | INTRAVENOUS | Status: AC | PRN
  Administered 2011-03-01: 500 [IU]
  Filled 2011-03-01: qty 5

## 2011-03-01 MED ORDER — ONDANSETRON 8 MG/50ML IVPB (CHCC): 8.0000 mg | Freq: Once | INTRAVENOUS | Status: DC

## 2011-03-01 MED ORDER — SODIUM CHLORIDE 0.9 % IJ SOLN
INTRAMUSCULAR | Status: AC
  Filled 2011-03-01: qty 10

## 2011-03-01 MED ORDER — SODIUM CHLORIDE 0.9 % IV SOLN
Freq: Once | INTRAVENOUS | Status: AC
  Administered 2011-03-01: 8 mg via INTRAVENOUS
  Filled 2011-03-01: qty 4

## 2011-03-01 MED ORDER — SODIUM CHLORIDE 0.9 % IV SOLN
Freq: Once | INTRAVENOUS | Status: AC
  Administered 2011-03-01: 1000 mL via INTRAVENOUS

## 2011-03-01 MED ORDER — DEXAMETHASONE SODIUM PHOSPHATE 10 MG/ML IJ SOLN: 10.0000 mg | Freq: Once | INTRAMUSCULAR | Status: DC

## 2011-03-01 NOTE — Progress Notes (Signed)
Tolerated chemo well today. 

## 2011-03-02 ENCOUNTER — Encounter (HOSPITAL_BASED_OUTPATIENT_CLINIC_OR_DEPARTMENT_OTHER): Payer: Medicare HMO | Admitting: Oncology

## 2011-03-02 VITALS — BP 180/105 | HR 65 | Temp 98.9°F | Wt 203.2 lb

## 2011-03-02 DIAGNOSIS — C9000 Multiple myeloma not having achieved remission: Secondary | ICD-10-CM

## 2011-03-02 NOTE — Patient Instructions (Signed)
[facility]  Discharge Instructions  RECOMMENDATIONS MADE BY THE CONSULTANT AND ANY TEST RESULTS WILL BE SENT TO YOUR REFERRING DOCTOR.     SPECIAL INSTRUCTIONS/FOLLOW-UP: We will continue with current therapy and schedule. We will do all lab work with your next chemotherapy.    I acknowledge that I have been informed and understand all the instructions given to me and received a copy. I do not have any more questions at this time, but understand that I may call the Specialty Clinic at [facility] at [phone] during business hours should I have any further questions or need assistance in obtaining follow-up care.    __________________________________________  _____________  __________ Signature of Patient or Authorized Representative            Date                   Time    __________________________________________ Nurse's Signature

## 2011-03-02 NOTE — Progress Notes (Signed)
Diane Stalling, MD [address] [place] [address]  1. MULTIPLE  MYELOMA  CBC, Differential, Comprehensive metabolic panel, Multiple myeloma panel, serum, Kappa/lambda light chains    CURRENT THERAPY:S/P cycle 1 Day 16 of carfilzomib.  INTERVAL HISTORY: [name] 70 y.o. female returns for  regular  visit for followup of multiple myeloma.  The patient reports that she feels well.  She reports noticing a difference in the way she is feeling since initiating the new therapy (carfilzomib).  She is ambulating without difficulty.  He appetite is strong.  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  The patient recently purchased a computerized tablet and asked more for some help with it.  I was able to help her slightly, but I did tell her that it was a product that I was not too familiar with.  She was pleased with what little help I was able to provide her.  Patient reports that her bowels are well controlled on stool softener as needed.    Past Medical History  Diagnosis Date  . Hypertension   . Coronary artery disease   . Diabetes mellitus     borderline  . GERD (gastroesophageal reflux disease)   . Cancer     colon: takes chemo  . Hypercholesteremia   . Multiple myeloma   . Female stress incontinence   . UTI (urinary tract infection) 11/05/2010  . Vertigo 01/06/2011  . Poor sleep pattern 01/06/2011    has MULTIPLE  MYELOMA; HYPERLIPIDEMIA; ANEMIA; HYPERTENSION; Cor Athrscl-Uns Vessel; DYSRHYTHMIA, CARDIAC NOS; GASTROESOPHAGEAL REFLUX DISEASE; GASTROPARESIS; DYSPEPSIA; CONSTIPATION NOS; OVERACTIVE BLADDER; ALOPECIA; OSTEOARTHRITIS; ARTHRALGIA; LEG PAIN; VERTIGO; OTHER NONSPECIFIC FINDINGS EXAMINATION OF BLOOD; PROTEINURIA; HX, PERSONAL, MALIGNANCY, COLON; UTI (urinary tract infection); Hypertension; Vertigo; and Poor sleep pattern on her problem list.     is allergic to gabapentin and simvastatin.  Ms. Meiners had no  medications administered during this visit.  Past Surgical History  Procedure Date  . Colon surgery     resection, [facility]  . Abdominal hysterectomy 1970s    [facility]  . Knee arthroscopy     LEFT, [facility]  . Heel spur surgery     KEELING, [facility]  . Portacath placement     [facility]   . Cervical spine surgery 2004    [facility]  . Port-a-cath removal 11/05/2010    Procedure: REMOVAL PORT-A-CATH;  Surgeon: Dalia Heading;  Location: [facility];  Service: General;  Laterality: Left;  started at 0846  . Portacath placement 11/05/2010    Procedure: INSERTION PORT-A-CATH;  Surgeon: Dalia Heading;  Location: [facility];  Service: General;  Laterality: Right;  Right subclavian, device left accessed  Procedure done at 0844    Denies any headaches, dizziness, double vision, fevers, chills, night sweats, nausea, vomiting, diarrhea, constipation, chest pain, heart palpitations, shortness of breath, blood in stool, black tarry stool, urinary pain, urinary burning, urinary frequency, hematuria.   PHYSICAL EXAMINATION  ECOG PERFORMANCE STATUS: 1 - Symptomatic but completely ambulatory  Filed Vitals:   03/02/11 1021  BP: 180/105  Pulse: 65  Temp: 98.9 F (37.2 C)    GENERAL:alert, no distress, well nourished, well developed, comfortable, cooperative, obese and smiling SKIN: skin color, texture, turgor are normal HEAD: Normocephalic EYES: normal EARS: External ears normal OROPHARYNX:mucous membranes are moist  NECK: supple, trachea midline LYMPH:  no palpable lymphadenopathy BREAST:not examined LUNGS: clear to auscultation  HEART: regular rate & rhythm, no murmurs, no gallops,  S1 normal and S2 normal ABDOMEN:abdomen soft, non-tender, obese and normal bowel sounds BACK: Back symmetric, no curvature. EXTREMITIES:less then 2 second capillary refill, no joint deformities, effusion, or inflammation, no edema, no skin discoloration, no clubbing, no cyanosis  NEURO: alert & oriented x 3 with fluent speech, no focal  motor/sensory deficits, gait normal   LABORATORY DATA: CBC    Component Value Date/Time   WBC 7.5 02/28/2011 0926   RBC 3.13* 02/28/2011 0926   HGB 9.4* 02/28/2011 0926   HCT 28.6* 02/28/2011 0926   PLT 137* 02/28/2011 0926   MCV 91.4 02/28/2011 0926   MCH 30.0 02/28/2011 0926   MCHC 32.9 02/28/2011 0926   RDW 15.4 02/28/2011 0926   LYMPHSABS 0.8 02/28/2011 0926   MONOABS 0.6 02/28/2011 0926   EOSABS 0.1 02/28/2011 0926   BASOSABS 0.0 02/28/2011 0926      Chemistry      Component Value Date/Time   NA 143 01/20/2011 1034   K 2.7* 01/20/2011 1034   CL 107 01/20/2011 1034   CO2 28 01/20/2011 1034   BUN 11 01/20/2011 1034   CREATININE 1.45* 01/20/2011 1034   CREATININE 104.89* 03/04/2010 0936      Component Value Date/Time   CALCIUM 9.7 01/20/2011 1034   ALKPHOS 35* 01/20/2011 1034   AST 12 01/20/2011 1034   ALT 6 01/20/2011 1034   BILITOT 0.4 01/20/2011 1034        ASSESSMENT:  1. 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 She presented initially as an MGUS in 1997 but evolved into myeloma within the last several years. 2. Constipation, well controlled with stool softener.   PLAN:  1. Continue with chemotherapy as scheduled.  2. Lab work in approximately 3 weeks time: CBC diff, CMET, MM panel.  This is a standing order and should be performed every 4 weeks.  3. Return as scheduled in 4 weeks for follow-up.   All questions were answered. The patient knows to call the clinic with any problems, questions or concerns. We can certainly see the patient much sooner if necessary.  The patient and plan discussed with Glenford Peers, MD and he is in agreement with the aforementioned.  I spent 20 minutes counseling the patient face to face. The total time spent in the appointment was 25 minutes.  KEFALAS,THOMAS

## 2011-03-03 ENCOUNTER — Other Ambulatory Visit (HOSPITAL_COMMUNITY): Payer: Medicare HMO

## 2011-03-14 ENCOUNTER — Encounter (HOSPITAL_BASED_OUTPATIENT_CLINIC_OR_DEPARTMENT_OTHER): Payer: Medicare HMO

## 2011-03-14 VITALS — BP 115/71 | HR 67 | Temp 98.1°F | Ht 65.0 in | Wt 190.6 lb

## 2011-03-14 DIAGNOSIS — C9000 Multiple myeloma not having achieved remission: Secondary | ICD-10-CM

## 2011-03-14 DIAGNOSIS — Z5112 Encounter for antineoplastic immunotherapy: Secondary | ICD-10-CM

## 2011-03-14 LAB — CBC
MCH: 30.5 pg (ref 26.0–34.0)
MCV: 96.4 fL (ref 78.0–100.0)
Platelets: 168 10*3/uL (ref 150–400)
RDW: 16.4 % — ABNORMAL HIGH (ref 11.5–15.5)
WBC: 4.4 10*3/uL (ref 4.0–10.5)

## 2011-03-14 LAB — DIFFERENTIAL
Basophils Absolute: 0 10*3/uL (ref 0.0–0.1)
Basophils Relative: 0 % (ref 0–1)
Eosinophils Absolute: 0.1 10*3/uL (ref 0.0–0.7)
Eosinophils Relative: 2 % (ref 0–5)
Lymphocytes Relative: 16 % (ref 12–46)

## 2011-03-14 MED ORDER — SODIUM CHLORIDE 0.9 % IV SOLN: Freq: Once | INTRAVENOUS | Status: DC

## 2011-03-14 MED ORDER — SODIUM CHLORIDE 0.9 % IJ SOLN
10.0000 mL | INTRAMUSCULAR | Status: DC | PRN
  Administered 2011-03-14: 10 mL
  Filled 2011-03-14: qty 10

## 2011-03-14 MED ORDER — SODIUM CHLORIDE 0.9 % IJ SOLN
INTRAMUSCULAR | Status: AC
  Filled 2011-03-14: qty 10

## 2011-03-14 MED ORDER — CARFILZOMIB CHEMO INJECTION 60 MG
20.0000 mg/m2 | Freq: Once | INTRAVENOUS | Status: AC
  Administered 2011-03-14: 40 mg via INTRAVENOUS
  Filled 2011-03-14: qty 20

## 2011-03-14 MED ORDER — HEPARIN SOD (PORK) LOCK FLUSH 100 UNIT/ML IV SOLN
500.0000 [IU] | Freq: Once | INTRAVENOUS | Status: AC | PRN
  Administered 2011-03-14: 500 [IU]
  Filled 2011-03-14: qty 5

## 2011-03-14 MED ORDER — SODIUM CHLORIDE 0.9 % IV SOLN
Freq: Once | INTRAVENOUS | Status: AC
  Administered 2011-03-14: 8 mg via INTRAVENOUS
  Filled 2011-03-14: qty 4

## 2011-03-14 MED ORDER — SODIUM CHLORIDE 0.9 % IV SOLN: 8.0000 mg | Freq: Once | INTRAVENOUS | Status: DC

## 2011-03-14 MED ORDER — DEXAMETHASONE SODIUM PHOSPHATE 10 MG/ML IJ SOLN: 10.0000 mg | Freq: Once | INTRAMUSCULAR | Status: DC

## 2011-03-14 MED ORDER — HEPARIN SOD (PORK) LOCK FLUSH 100 UNIT/ML IV SOLN
INTRAVENOUS | Status: AC
  Filled 2011-03-14: qty 5

## 2011-03-14 MED ORDER — SODIUM CHLORIDE 0.9 % IV SOLN
Freq: Once | INTRAVENOUS | Status: AC
  Administered 2011-03-14: 10:00:00 via INTRAVENOUS

## 2011-03-15 ENCOUNTER — Encounter (HOSPITAL_BASED_OUTPATIENT_CLINIC_OR_DEPARTMENT_OTHER): Payer: Medicare HMO

## 2011-03-15 VITALS — BP 136/81 | HR 71 | Temp 98.3°F | Wt 193.8 lb

## 2011-03-15 DIAGNOSIS — Z5112 Encounter for antineoplastic immunotherapy: Secondary | ICD-10-CM

## 2011-03-15 DIAGNOSIS — C9000 Multiple myeloma not having achieved remission: Secondary | ICD-10-CM

## 2011-03-15 LAB — KAPPA/LAMBDA LIGHT CHAINS
Kappa free light chain: 0.81 mg/dL (ref 0.33–1.94)
Kappa, lambda light chain ratio: 0.19 — ABNORMAL LOW (ref 0.26–1.65)
Lambda free light chains: 4.21 mg/dL — ABNORMAL HIGH (ref 0.57–2.63)

## 2011-03-15 MED ORDER — CARFILZOMIB CHEMO INJECTION 60 MG
20.0000 mg/m2 | Freq: Once | INTRAVENOUS | Status: AC
  Administered 2011-03-15: 40 mg via INTRAVENOUS
  Filled 2011-03-15: qty 20

## 2011-03-15 MED ORDER — HEPARIN SOD (PORK) LOCK FLUSH 100 UNIT/ML IV SOLN
INTRAVENOUS | Status: AC
  Filled 2011-03-15: qty 5

## 2011-03-15 MED ORDER — DEXAMETHASONE SODIUM PHOSPHATE 10 MG/ML IJ SOLN: 10.0000 mg | Freq: Once | INTRAMUSCULAR | Status: DC

## 2011-03-15 MED ORDER — SODIUM CHLORIDE 0.9 % IJ SOLN
INTRAMUSCULAR | Status: AC
  Filled 2011-03-15: qty 20

## 2011-03-15 MED ORDER — SODIUM CHLORIDE 0.9 % IJ SOLN
10.0000 mL | INTRAMUSCULAR | Status: DC | PRN
  Administered 2011-03-15: 10 mL
  Filled 2011-03-15: qty 10

## 2011-03-15 MED ORDER — SODIUM CHLORIDE 0.9 % IV SOLN: 8.0000 mg | Freq: Once | INTRAVENOUS | Status: DC

## 2011-03-15 MED ORDER — SODIUM CHLORIDE 0.9 % IV SOLN
Freq: Once | INTRAVENOUS | Status: AC
  Administered 2011-03-15: 10:00:00 via INTRAVENOUS

## 2011-03-15 MED ORDER — SODIUM CHLORIDE 0.9 % IV SOLN
Freq: Once | INTRAVENOUS | Status: AC
  Administered 2011-03-15: 8 mg via INTRAVENOUS
  Filled 2011-03-15: qty 4

## 2011-03-15 MED ORDER — HEPARIN SOD (PORK) LOCK FLUSH 100 UNIT/ML IV SOLN
500.0000 [IU] | Freq: Once | INTRAVENOUS | Status: AC | PRN
  Administered 2011-03-15: 500 [IU]
  Filled 2011-03-15: qty 5

## 2011-03-15 NOTE — Progress Notes (Signed)
Infusion complete, patient tolerated well.   

## 2011-03-18 LAB — MULTIPLE MYELOMA PANEL, SERUM
Albumin ELP: 59.3 % (ref 55.8–66.1)
Alpha-1-Globulin: 5.2 % — ABNORMAL HIGH (ref 2.9–4.9)
Alpha-2-Globulin: 13.3 % — ABNORMAL HIGH (ref 7.1–11.8)
Beta 2: 4.2 % (ref 3.2–6.5)
Beta Globulin: 5.1 % (ref 4.7–7.2)
Gamma Globulin: 12.9 % (ref 11.1–18.8)
IgM, Serum: 33 mg/dL — ABNORMAL LOW (ref 52–322)
Total Protein: 7 g/dL (ref 6.0–8.3)

## 2011-03-21 ENCOUNTER — Encounter (HOSPITAL_BASED_OUTPATIENT_CLINIC_OR_DEPARTMENT_OTHER): Payer: Medicare HMO

## 2011-03-21 VITALS — BP 122/81 | HR 80 | Temp 98.8°F | Wt 191.0 lb

## 2011-03-21 DIAGNOSIS — C9000 Multiple myeloma not having achieved remission: Secondary | ICD-10-CM

## 2011-03-21 DIAGNOSIS — Z5112 Encounter for antineoplastic immunotherapy: Secondary | ICD-10-CM

## 2011-03-21 LAB — CBC
Hemoglobin: 10 g/dL — ABNORMAL LOW (ref 12.0–15.0)
MCH: 31 pg (ref 26.0–34.0)
RBC: 3.23 MIL/uL — ABNORMAL LOW (ref 3.87–5.11)

## 2011-03-21 LAB — DIFFERENTIAL
Lymphocytes Relative: 19 % (ref 12–46)
Lymphs Abs: 1.1 10*3/uL (ref 0.7–4.0)
Monocytes Relative: 11 % (ref 3–12)
Neutro Abs: 3.6 10*3/uL (ref 1.7–7.7)
Neutrophils Relative %: 67 % (ref 43–77)

## 2011-03-21 MED ORDER — DEXAMETHASONE SODIUM PHOSPHATE 10 MG/ML IJ SOLN: 10.0000 mg | Freq: Once | INTRAMUSCULAR | Status: DC

## 2011-03-21 MED ORDER — SODIUM CHLORIDE 0.9 % IV SOLN
Freq: Once | INTRAVENOUS | Status: AC
  Administered 2011-03-21: 13:00:00 via INTRAVENOUS

## 2011-03-21 MED ORDER — HEPARIN SOD (PORK) LOCK FLUSH 100 UNIT/ML IV SOLN
INTRAVENOUS | Status: AC
  Filled 2011-03-21: qty 5

## 2011-03-21 MED ORDER — HEPARIN SOD (PORK) LOCK FLUSH 100 UNIT/ML IV SOLN
500.0000 [IU] | Freq: Once | INTRAVENOUS | Status: AC | PRN
  Administered 2011-03-21: 500 [IU]
  Filled 2011-03-21: qty 5

## 2011-03-21 MED ORDER — SODIUM CHLORIDE 0.9 % IV SOLN
Freq: Once | INTRAVENOUS | Status: AC
  Administered 2011-03-21: 8 mg via INTRAVENOUS
  Filled 2011-03-21: qty 4

## 2011-03-21 MED ORDER — SODIUM CHLORIDE 0.9 % IV SOLN: 8.0000 mg | Freq: Once | INTRAVENOUS | Status: DC

## 2011-03-21 MED ORDER — DEXTROSE 5 % IV SOLN
20.0000 mg/m2 | Freq: Once | INTRAVENOUS | Status: AC
  Administered 2011-03-21: 40 mg via INTRAVENOUS
  Filled 2011-03-21: qty 20

## 2011-03-21 NOTE — Progress Notes (Signed)
Tolerated chemo and hydration well.

## 2011-03-22 ENCOUNTER — Encounter (HOSPITAL_BASED_OUTPATIENT_CLINIC_OR_DEPARTMENT_OTHER): Payer: Medicare HMO

## 2011-03-22 ENCOUNTER — Ambulatory Visit (HOSPITAL_COMMUNITY)
Admission: RE | Admit: 2011-03-22 | Discharge: 2011-03-22 | Disposition: A | Discharge status: Home / Self Care | Payer: Medicare HMO | Source: Ambulatory Visit | Attending: Oncology | Admitting: Oncology

## 2011-03-22 ENCOUNTER — Encounter (HOSPITAL_COMMUNITY): Payer: Medicare HMO | Attending: Oncology | Admitting: Oncology

## 2011-03-22 ENCOUNTER — Other Ambulatory Visit (HOSPITAL_COMMUNITY): Payer: Self-pay | Admitting: Oncology

## 2011-03-22 ENCOUNTER — Other Ambulatory Visit (HOSPITAL_COMMUNITY): Payer: Medicare HMO

## 2011-03-22 VITALS — BP 129/73 | HR 62 | Temp 98.2°F | Wt 195.8 lb

## 2011-03-22 DIAGNOSIS — R209 Unspecified disturbances of skin sensation: Secondary | ICD-10-CM | POA: Insufficient documentation

## 2011-03-22 DIAGNOSIS — I10 Essential (primary) hypertension: Secondary | ICD-10-CM | POA: Insufficient documentation

## 2011-03-22 DIAGNOSIS — Z5112 Encounter for antineoplastic immunotherapy: Secondary | ICD-10-CM

## 2011-03-22 DIAGNOSIS — C9000 Multiple myeloma not having achieved remission: Secondary | ICD-10-CM | POA: Insufficient documentation

## 2011-03-22 DIAGNOSIS — E119 Type 2 diabetes mellitus without complications: Secondary | ICD-10-CM | POA: Insufficient documentation

## 2011-03-22 DIAGNOSIS — Z87898 Personal history of other specified conditions: Secondary | ICD-10-CM | POA: Insufficient documentation

## 2011-03-22 DIAGNOSIS — R51 Headache: Secondary | ICD-10-CM | POA: Insufficient documentation

## 2011-03-22 DIAGNOSIS — I6529 Occlusion and stenosis of unspecified carotid artery: Secondary | ICD-10-CM | POA: Insufficient documentation

## 2011-03-22 DIAGNOSIS — I672 Cerebral atherosclerosis: Secondary | ICD-10-CM | POA: Insufficient documentation

## 2011-03-22 DIAGNOSIS — E785 Hyperlipidemia, unspecified: Secondary | ICD-10-CM | POA: Insufficient documentation

## 2011-03-22 MED ORDER — DEXTROSE 5 % IV SOLN
20.0000 mg/m2 | Freq: Once | INTRAVENOUS | Status: DC
  Filled 2011-03-22: qty 20

## 2011-03-22 MED ORDER — SODIUM CHLORIDE 0.9 % IV SOLN
Freq: Once | INTRAVENOUS | Status: AC
  Administered 2011-03-22: 11:00:00 via INTRAVENOUS

## 2011-03-22 MED ORDER — SODIUM CHLORIDE 0.9 % IJ SOLN
INTRAMUSCULAR | Status: AC
  Filled 2011-03-22: qty 10

## 2011-03-22 MED ORDER — SODIUM CHLORIDE 0.9 % IV SOLN
Freq: Once | INTRAVENOUS | Status: AC
  Administered 2011-03-22: 8 mg via INTRAVENOUS
  Filled 2011-03-22: qty 4

## 2011-03-22 MED ORDER — HEPARIN SOD (PORK) LOCK FLUSH 100 UNIT/ML IV SOLN
INTRAVENOUS | Status: AC
  Administered 2011-03-22: 500 [IU]
  Filled 2011-03-22: qty 5

## 2011-03-22 MED ORDER — SODIUM CHLORIDE 0.9 % IJ SOLN
10.0000 mL | INTRAMUSCULAR | Status: DC | PRN
  Filled 2011-03-22: qty 10

## 2011-03-22 MED ORDER — DEXAMETHASONE SODIUM PHOSPHATE 10 MG/ML IJ SOLN: 10.0000 mg | Freq: Once | INTRAMUSCULAR | Status: DC

## 2011-03-22 MED ORDER — DEXTROSE 5 % IV SOLN
40.0000 mg | Freq: Once | INTRAVENOUS | Status: AC
  Administered 2011-03-22: 40 mg via INTRAVENOUS
  Filled 2011-03-22: qty 20

## 2011-03-22 MED ORDER — SODIUM CHLORIDE 0.9 % IV SOLN: 8.0000 mg | Freq: Once | INTRAVENOUS | Status: DC

## 2011-03-22 MED ORDER — HEPARIN SOD (PORK) LOCK FLUSH 100 UNIT/ML IV SOLN
500.0000 [IU] | Freq: Once | INTRAVENOUS | Status: AC | PRN
  Administered 2011-03-22: 500 [IU]
  Filled 2011-03-22: qty 5

## 2011-03-25 ENCOUNTER — Other Ambulatory Visit (HOSPITAL_COMMUNITY): Payer: Self-pay | Admitting: Oncology

## 2011-03-25 NOTE — Progress Notes (Signed)
This office note has been dictated.

## 2011-03-26 NOTE — Progress Notes (Signed)
CC:   Melvyn Novas, MD  [name] has myeloma but was here the other day for therapy.  She is actually responding to the therapy but wanted to see me in the treatment room, and so I sat down with her, and she told me on two occasions that she had weakness in the right arm and right leg which was quite significant lasting only a few minutes.  The second time it occurred she actually had to go to the floor.  The first time it happened she tried to get up, sat back down, but she was walking the second time when it occurred.  She did not have any permanent sequelae, and the incident lasted only about 5-10 minutes, and then she was fine again she states.  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  It is not severe, but it is in the 50% to 70% range, so I think she needs a neurological opinion.  In the meantime, I have asked her to make sure she is taking an aspirin a day, and will get a neurological opinion about her.  I will continue her carfilzomib therapy which is working very, very nicely so far, and she is tolerating it well other than she also noticed that she is having some numbness again in both of her feet which is what she also had with Velcade which we had to stop.  We will have to see how she does with that, but I will not increase her dose of carfilzomib from 20 to 27 mg/sq m for the next cycle.  Will just stay with 20 per meter squared indefinitely.    ______________________________ [name]. Mariel Sleet, MD ESN/MEDQ  D:  03/25/2011  T:  03/26/2011  Job:  [id number]

## 2011-03-28 ENCOUNTER — Encounter (HOSPITAL_COMMUNITY): Payer: Medicare HMO | Attending: Oncology

## 2011-03-28 VITALS — BP 115/70 | HR 86 | Temp 99.4°F | Ht 65.0 in | Wt 190.0 lb

## 2011-03-28 DIAGNOSIS — R52 Pain, unspecified: Secondary | ICD-10-CM | POA: Insufficient documentation

## 2011-03-28 DIAGNOSIS — C9000 Multiple myeloma not having achieved remission: Secondary | ICD-10-CM | POA: Insufficient documentation

## 2011-03-28 DIAGNOSIS — E875 Hyperkalemia: Secondary | ICD-10-CM | POA: Insufficient documentation

## 2011-03-28 DIAGNOSIS — R509 Fever, unspecified: Secondary | ICD-10-CM | POA: Insufficient documentation

## 2011-03-28 DIAGNOSIS — D649 Anemia, unspecified: Secondary | ICD-10-CM | POA: Insufficient documentation

## 2011-03-28 DIAGNOSIS — Z5112 Encounter for antineoplastic immunotherapy: Secondary | ICD-10-CM

## 2011-03-28 LAB — CBC
MCH: 31.5 pg (ref 26.0–34.0)
MCHC: 32.8 g/dL (ref 30.0–36.0)
Platelets: 157 10*3/uL (ref 150–400)
RBC: 3.11 MIL/uL — ABNORMAL LOW (ref 3.87–5.11)

## 2011-03-28 LAB — DIFFERENTIAL
Basophils Absolute: 0 10*3/uL (ref 0.0–0.1)
Basophils Relative: 0 % (ref 0–1)
Eosinophils Absolute: 0.1 10*3/uL (ref 0.0–0.7)
Neutrophils Relative %: 80 % — ABNORMAL HIGH (ref 43–77)

## 2011-03-28 MED ORDER — HEPARIN SOD (PORK) LOCK FLUSH 100 UNIT/ML IV SOLN
INTRAVENOUS | Status: AC
  Filled 2011-03-28: qty 5

## 2011-03-28 MED ORDER — SODIUM CHLORIDE 0.9 % IV SOLN: 8.0000 mg | Freq: Once | INTRAVENOUS | Status: DC

## 2011-03-28 MED ORDER — SODIUM CHLORIDE 0.9 % IV SOLN
Freq: Once | INTRAVENOUS | Status: AC
  Administered 2011-03-28: 1000 mL via INTRAVENOUS

## 2011-03-28 MED ORDER — HEPARIN SOD (PORK) LOCK FLUSH 100 UNIT/ML IV SOLN
500.0000 [IU] | Freq: Once | INTRAVENOUS | Status: AC | PRN
  Administered 2011-03-28: 500 [IU]
  Filled 2011-03-28: qty 5

## 2011-03-28 MED ORDER — DEXTROSE 5 % IV SOLN
20.0000 mg/m2 | Freq: Once | INTRAVENOUS | Status: AC
  Administered 2011-03-28: 40 mg via INTRAVENOUS
  Filled 2011-03-28: qty 20

## 2011-03-28 MED ORDER — DEXAMETHASONE SODIUM PHOSPHATE 10 MG/ML IJ SOLN: 10.0000 mg | Freq: Once | INTRAMUSCULAR | Status: DC

## 2011-03-28 MED ORDER — SODIUM CHLORIDE 0.9 % IJ SOLN
10.0000 mL | INTRAMUSCULAR | Status: DC | PRN
  Administered 2011-03-28: 10 mL
  Filled 2011-03-28: qty 10

## 2011-03-28 MED ORDER — SODIUM CHLORIDE 0.9 % IV SOLN
Freq: Once | INTRAVENOUS | Status: AC
  Administered 2011-03-28: 8 mg via INTRAVENOUS
  Filled 2011-03-28: qty 4

## 2011-03-28 NOTE — Progress Notes (Signed)
Tolerated chemo well. No further c/o pain today.

## 2011-03-29 ENCOUNTER — Encounter (HOSPITAL_BASED_OUTPATIENT_CLINIC_OR_DEPARTMENT_OTHER): Payer: Medicare HMO

## 2011-03-29 ENCOUNTER — Encounter (HOSPITAL_BASED_OUTPATIENT_CLINIC_OR_DEPARTMENT_OTHER): Payer: Medicare HMO | Admitting: Oncology

## 2011-03-29 VITALS — BP 129/78 | HR 75 | Temp 99.3°F | Ht 65.0 in | Wt 193.0 lb

## 2011-03-29 DIAGNOSIS — Z5112 Encounter for antineoplastic immunotherapy: Secondary | ICD-10-CM

## 2011-03-29 DIAGNOSIS — C9000 Multiple myeloma not having achieved remission: Secondary | ICD-10-CM

## 2011-03-29 LAB — URINE MICROSCOPIC-ADD ON

## 2011-03-29 LAB — URINALYSIS, ROUTINE W REFLEX MICROSCOPIC
Leukocytes, UA: NEGATIVE
Urobilinogen, UA: 0.2 mg/dL (ref 0.0–1.0)

## 2011-03-29 MED ORDER — HEPARIN SOD (PORK) LOCK FLUSH 100 UNIT/ML IV SOLN
500.0000 [IU] | Freq: Once | INTRAVENOUS | Status: AC | PRN
  Administered 2011-03-29: 500 [IU]
  Filled 2011-03-29: qty 5

## 2011-03-29 MED ORDER — SODIUM CHLORIDE 0.9 % IJ SOLN
10.0000 mL | INTRAMUSCULAR | Status: DC | PRN
  Administered 2011-03-29: 10 mL
  Filled 2011-03-29: qty 10

## 2011-03-29 MED ORDER — DEXAMETHASONE SODIUM PHOSPHATE 10 MG/ML IJ SOLN: 10.0000 mg | Freq: Once | INTRAMUSCULAR | Status: DC

## 2011-03-29 MED ORDER — DEXTROSE 5 % IV SOLN
20.0000 mg/m2 | Freq: Once | INTRAVENOUS | Status: AC
  Administered 2011-03-29: 40 mg via INTRAVENOUS
  Filled 2011-03-29: qty 20

## 2011-03-29 MED ORDER — SODIUM CHLORIDE 0.9 % IV SOLN
Freq: Once | INTRAVENOUS | Status: AC
  Administered 2011-03-29: 1000 mL via INTRAVENOUS

## 2011-03-29 MED ORDER — HEPARIN SOD (PORK) LOCK FLUSH 100 UNIT/ML IV SOLN
INTRAVENOUS | Status: AC
  Filled 2011-03-29: qty 5

## 2011-03-29 MED ORDER — ONDANSETRON HCL 40 MG/20ML IJ SOLN: 8.0000 mg | Freq: Once | INTRAMUSCULAR | Status: DC

## 2011-03-29 MED ORDER — SODIUM CHLORIDE 0.9 % IV SOLN
Freq: Once | INTRAVENOUS | Status: AC
  Administered 2011-03-29: 8 mg via INTRAVENOUS
  Filled 2011-03-29: qty 4

## 2011-03-29 MED ORDER — SODIUM CHLORIDE 0.9 % IJ SOLN
INTRAMUSCULAR | Status: AC
  Administered 2011-03-29: 10 mL
  Filled 2011-03-29: qty 10

## 2011-03-29 NOTE — Patient Instructions (Addendum)
[name]  [id number] [birth year] [facility]  Discharge Instructions  RECOMMENDATIONS MADE BY THE CONSULTANT AND ANY TEST RESULTS WILL BE SENT TO YOUR REFERRING DOCTOR.   EXAM FINDINGS BY MD TODAY AND SIGNS AND SYMPTOMS TO REPORT TO CLINIC OR PRIMARY MD: Your multiple myeloma is responding to your chemotherapy.  Need to get you to see a neurologist to evaluate the symptoms that you have that may be Trans Ischemic Attacks (TIAs).  We will work on getting you some assistance with transportation. Don't take the omeprazole or the acyclovir for now.  MEDICATIONS PRESCRIBED: none   INSTRUCTIONS GIVEN AND DISCUSSED: Other :  Report fevers, chills, uncontrolled nausea or pain.  SPECIAL INSTRUCTIONS/FOLLOW-UP: Lab work Needed in 2 weeks and Return to Clinic on as scheduled.   I acknowledge that I have been informed and understand all the instructions given to me and received a copy. I do not have any more questions at this time, but understand that I may call the Specialty Clinic at [facility] at [phone] during business hours should I have any further questions or need assistance in obtaining follow-up care.    __________________________________________  _____________  __________ Signature of Patient or Authorized Representative            Date                   Time    __________________________________________ Nurse's Signature     Information about Transient Ischemic Attacks  Transient Ischemic Attack A transient ischemic attack (TIA) is a "warning stroke" that causes stroke-like symptoms. Unlike a stroke, a TIA does not cause permanent damage to the brain. The symptoms of a TIA can happen very fast and do not last long. It is important to know the symptoms of a TIA and what to do. This can help prevent a major stroke or death. CAUSES    A TIA is caused by a temporary blockage in an artery in the brain or neck (carotid artery). The blockage does  not allow the brain to get the blood supply it needs and can cause different symptoms. The blockage can be caused by either:     A blood clot.     Fatty buildup (plaque) in a neck or brain artery.  SYMPTOMS   TIA symptoms are the same as a stroke but are temporary. Symptoms can include sudden:  Numbness or weakness on one side of the body. Especially to the:     Face.    Arm.    Leg.    Trouble speaking, thinking, or confusion.     Change in vision, such as trouble seeing in one or both eyes.     Dizziness, loss of balance, or difficulty walking.     Severe headache.  ANY OF THESE SYMPTOMS MAY REPRESENT A SERIOUS PROBLEM THAT IS AN EMERGENCY. Do not wait to see if the symptoms will go away. Get medical help at once. Call your local emergency services (911 in [place] IMMEDIATELY. DO NOT drive yourself to the hospital. RISK FACTORS Risk factors can increase the risk of developing a TIA. These can include.    High blood pressure (hypertension).     High cholesterol (hyperlipidemia).     Heart disease (atherosclerosis).     Smoking.    Diabetes.    Abnormal heart rhythm (atrial fibrillation).     Family history of a stroke or heart attack.     Use of oral contraceptives (especially  when combined with smoking).  DIAGNOSIS    A TIA can be diagnosed based on your:     Symptoms.    History.    Risk factors.     Tests that can help diagnose the symptoms of a TIA include:     CT or MRI scan. These tests can provide detailed images of the brain.     Carotid ultrasound. This test looks to see if there are blockages in the carotid arteries of your neck.     Arteriography. A thin, small flexible tube (catheter) is inserted through a small cut (incision) in your groin. The catheter is threaded to your carotid or vertebral artery. A dye is then injected into the catheter. The dye highlights the arteries in your brain and allows your caregiver to look for narrowing or blockages  that can cause a TIA.  TREATMENT   Based on the cause of a TIA, treatment options can vary. Treatment is important to help prevent a stroke. Treatment options can include:  Medication. Such as:     Clot-busting medicine.     Anti-platelet medicine.     Blood pressure medicine.     Blood thinner medicine.     Surgery:    Carotid endarterectomy. The carotid arteries are the arteries that supply the head and neck with oxygenated blood. This surgery can help remove fatty deposits (plaque) in the carotid arteries.     Angioplasty and stenting. This surgery uses a balloon to dilate a blocked artery in the brain. A stent is a small, metal mesh tube that can help keep an artery open  HOME CARE INSTRUCTIONS    It is important to take all medicine as told by your caregiver. If the medicine has side effects that affect you negatively, tell your caregiver right away. Do not stop taking medicine unless told by your caregiver. Some medicines may need to be changed to better treat your condition.     Do not smoke. Talk to your caregiver on how to quit smoking.     Eat a diet high in fruits, vegetables and lean meat. Avoid a high fat, high salt diet. A dietician can you help you make healthy food choices.     Maintain a healthy weight. Develop an exercise plan approved by your caregiver.  SEEK IMMEDIATE MEDICAL CARE IF:    You develop weakness or numbness on one side of your body.     You have problems thinking, speaking, or feel confused.     You have vision changes.     You feel dizzy, have trouble walking, or lose your balance.     You develop a severe headache.  MAKE SURE YOU:    Understand these instructions.     Will watch your condition.     Will get help right away if you are not doing well or get worse.  Document Released: 01/19/2005 Document Revised: 12/22/2010 Document Reviewed: 06/04/2009 [facility] Patient Information 2012 [place], [place].

## 2011-03-29 NOTE — Progress Notes (Signed)
See assessment dome 03/28/11

## 2011-03-29 NOTE — Progress Notes (Signed)
[name], MD [address] [place] [address]  1. MULTIPLE  MYELOMA     CURRENT THERAPY:S/P 2 cycles of Carfilzomib. S/P Revlimid and dexamethasone but developed  Revlimid-induced skin toxicity with contraction of her skin of the legs as well as significant peripheral neuropathies. That is down to a grade 1 neuropathy now. Next, she was treated with Velcade and developed more neuropathic symptomatology to the point that she could not walk and was always coming up here in a wheelchair. Could hardly get around at home. She has been most recently treated with Cytoxan and dexamethasone. That was marginally effective at best.      INTERVAL HISTORY: [name] 70 y.o. 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 She presented initially as an MGUS in 1997 but evolved into myeloma within the last several years.  The patient is doing well with present chemotherapy.  She has been having some increased peripheral neuropathy, grade 2, and therefore the decision was made to not titrate her carfilzomib dose upward to 27 mg/m2.  We will keep her at 20 mg/m2.  The peripheral neuropathy is not yet interfering with ADLs.  She reports that she is simply more conscious of where she is placing her feet.    In the past, the patient experienced right sided numbness that was not long lasting.  As a result, an MRI and MRA of brain was performed which reveals some vessel stenosis.  Therefore we are referring her to neurology.  She is having a difficult time organizing transportation.  Will recruit the aid of our financial counselor April Manson).  The concern is for TIAs.  The patient otherwise is doing well.  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  We will keep a close eye on that and consider reducing dose of chemotherapy if peripheral neuropathy worsens.  She asked me to review her medications which have changed slightly.  Her PCP  discontinued her Acyclovir which was used for cold sore prophylaxis while on Revlmid.  He decreased her oxybutynin dose, but she is having more difficulty with maintaining urinary continence.  I have asked her to go back to her 5 mg PO BID.  Her PCP placed her on Dexilant 60 mg daily.  I therefore asked her to hold her omeprazole.     Past Medical History  Diagnosis Date  . Hypertension   . Coronary artery disease   . Diabetes mellitus     borderline  . GERD (gastroesophageal reflux disease)   . Cancer     colon: takes chemo  . Hypercholesteremia   . Multiple myeloma   . Female stress incontinence   . UTI (urinary tract infection) 11/05/2010  . Vertigo 01/06/2011  . Poor sleep pattern 01/06/2011    has MULTIPLE  MYELOMA; HYPERLIPIDEMIA; ANEMIA; HYPERTENSION; Cor Athrscl-Uns Vessel; DYSRHYTHMIA, CARDIAC NOS; GASTROESOPHAGEAL REFLUX DISEASE; GASTROPARESIS; DYSPEPSIA; CONSTIPATION NOS; OVERACTIVE BLADDER; ALOPECIA; OSTEOARTHRITIS; ARTHRALGIA; LEG PAIN; VERTIGO; OTHER NONSPECIFIC FINDINGS EXAMINATION OF BLOOD; PROTEINURIA; HX, PERSONAL, MALIGNANCY, COLON; UTI (urinary tract infection); Hypertension; Vertigo; and Poor sleep pattern on her problem list.     is allergic to gabapentin and simvastatin.  Ms. Friese had no medications administered during this visit.  Past Surgical History  Procedure Date  . Colon surgery     resection, [facility]  . Abdominal hysterectomy 1970s    [facility]  . Knee arthroscopy     LEFT, [facility]  . Heel spur  surgery     KEELING, [facility]  . Portacath placement     [facility]   . Cervical spine surgery 2004    [facility]  . Port-a-cath removal 11/05/2010    Procedure: REMOVAL PORT-A-CATH;  Surgeon: Dalia Heading;  Location: [facility];  Service: General;  Laterality: Left;  started at 0846  . Portacath placement 11/05/2010    Procedure: INSERTION PORT-A-CATH;  Surgeon: Dalia Heading;  Location: [facility];  Service: General;  Laterality: Right;  Right subclavian, device left accessed  Procedure  done at 0844    Denies any headaches, dizziness, double vision, fevers, chills, night sweats, nausea, vomiting, diarrhea, constipation, chest pain, heart palpitations, shortness of breath, blood in stool, black tarry stool, urinary pain, urinary burning, urinary frequency, hematuria.   PHYSICAL EXAMINATION  ECOG PERFORMANCE STATUS: 2 - Symptomatic, <50% confined to bed  There were no vitals taken for this visit.   GENERAL:alert, no distress, well nourished, well developed, comfortable, cooperative, obese and smiling SKIN: skin color, texture, turgor are normal HEAD: Normocephalic EYES: normal EARS: External ears normal OROPHARYNX:mucous membranes are moist  NECK: supple, trachea midline LYMPH:  no palpable lymphadenopathy BREAST:not examined LUNGS: clear to auscultation and percussion HEART: regular rate & rhythm, no murmurs, no gallops, S1 normal and S2 normal ABDOMEN:abdomen soft, non-tender, obese and normal bowel sounds BACK: Back symmetric, no curvature., No CVA tenderness EXTREMITIES:less then 2 second capillary refill, no joint deformities, effusion, or inflammation, no edema, no skin discoloration, no clubbing, no cyanosis  NEURO: alert & oriented x 3 with fluent speech, no focal motor/sensory deficits, gait normal.  She does have numbness and tingling in feet and hands.    LABORATORY DATA: CBC    Component Value Date/Time   WBC 8.7 03/28/2011 1010   RBC 3.11* 03/28/2011 1010   HGB 9.8* 03/28/2011 1010   HCT 29.9* 03/28/2011 1010   PLT 157 03/28/2011 1010   MCV 96.1 03/28/2011 1010   MCH 31.5 03/28/2011 1010   MCHC 32.8 03/28/2011 1010   RDW 17.0* 03/28/2011 1010   LYMPHSABS 1.1 03/28/2011 1010   MONOABS 0.6 03/28/2011 1010   EOSABS 0.1 03/28/2011 1010   BASOSABS 0.0 03/28/2011 1010      Chemistry      Component Value Date/Time   NA 143 01/20/2011 1034   K 2.7* 01/20/2011 1034   CL 107 01/20/2011 1034   CO2 28 01/20/2011 1034   BUN 11 01/20/2011 1034   CREATININE 1.45*  01/20/2011 1034   CREATININE 104.89* 03/04/2010 0936      Component Value Date/Time   CALCIUM 9.7 01/20/2011 1034   ALKPHOS 35* 01/20/2011 1034   AST 12 01/20/2011 1034   ALT 6 01/20/2011 1034   BILITOT 0.4 01/20/2011 1034       RADIOGRAPHIC STUDIES:  03/25/2011  *RADIOLOGY REPORT*  Clinical Data: Right side numbness question stroke, history  hypertension, coronary artery disease, multiple myeloma, diabetes,  history smoking, mini stroke, coronary artery stenting  BILATERAL CAROTID DUPLEX ULTRASOUND  Technique: Wallace Cullens scale imaging, color Doppler and duplex ultrasound  was performed of bilateral carotid and vertebral arteries in the  neck.  Comparison: 12/01/2010  Criteria: Quantification of carotid stenosis is based on velocity  parameters that correlate the residual internal carotid diameter  with NASCET-based stenosis levels, using the diameter of the distal  internal carotid lumen as the denominator for stenosis measurement.  The following velocity measurements were obtained:  PEAK SYSTOLIC/END DIASTOLIC  RIGHT  ICA: 54/14cm/sec  CCA: 66/13cm/sec  SYSTOLIC  ICA/CCA RATIO: 0.83  DIASTOLIC ICA/CCA RATIO: 1.06  ECA: 105cm/sec  LEFT  ICA: 140/30cm/sec  CCA: 67/17cm/sec  SYSTOLIC ICA/CCA RATIO: 2.12  DIASTOLIC ICA/CCA RATIO: 1.78  ECA: 97cm/sec  Findings:  RIGHT CAROTID ARTERY: Intimal thickening throughout the right  carotid system. Minimal noncalcified plaque right carotid  bifurcation. Laminar flow by color Doppler imaging. Tortuosity of  the left ICA with resultant mild spectral broadening. No high  velocity jets.  RIGHT VERTEBRAL ARTERY: Patent, antegrade  LEFT CAROTID ARTERY: Intimal thickening throughout left carotid  system which is tortuous. Noncalcified plaque left carotid bulb  and bifurcation. Associated turbulent flow proximal left ICA.  Spectral broadening left ICA on waveform analysis.  LEFT VERTEBRAL ARTERY: Patent, antegrade  IMPRESSION:  Mild plaque  formation at left carotid bifurcation with associated  turbulent flow increased velocity.  Obtained left ICA velocity corresponds to a 50-69% diameter  stenosis.  No evidence of hemodynamically significant stenosis within the  right carotid system.  Original Report Authenticated By: Lollie Marrow, M.D.   03/22/2011  *RADIOLOGY REPORT*  Clinical Data: Temporary right-sided numbness. History of  multiple myeloma. Right-sided headache.  MRI HEAD WITHOUT CONTRAST  MRA HEAD WITHOUT CONTRAST  Technique: Multiplanar, multiecho pulse sequences of the brain and  surrounding structures were obtained without intravenous contrast.  Angiographic images of the head were obtained using MRA technique  without contrast.  Comparison: CT head without contrast 11/30/2010 at [facility]. MRI brain without contrast 05/28/2008 at [facility].  MRI HEAD  Findings: This study is moderately degraded by patient motion.  The diffusion weighted images demonstrate no evidence for acute or  subacute infarction. Extensive periventricular and subcortical  white matter changes bilaterally are stable. These are advanced  for age. Flow is present in the major intracranial arteries. The  patient is status post left lens extraction. The globes and orbits  are otherwise intact. The paranasal sinuses and mastoid air cells  are clear. Fusion of the upper cervical spine is again noted,  beginning at C3.  IMPRESSION:  1. No acute intracranial abnormality or significant interval  change.  2. Stable moderate periventricular subcortical white matter  changes bilaterally. The finding is nonspecific but can be seen in  the setting of chronic microvascular ischemia, a demyelinating  process such as multiple sclerosis, vasculitis, complicated  migraine headaches, or as the sequelae of a prior infectious or  inflammatory process.  MRA HEAD  Findings: A moderate tortuosity is present in the proximal  internal  carotid arteries bilaterally without definite stenosis. The  internal carotid arteries are otherwise within normal limits to the  ICA termini bilaterally. The right A1 segment is aplastic. The  left A1 segment is within normal limits. Both M1 segments are  normal. The MCA bifurcations are normal bilaterally. There is  some attenuation of distal MCA branch vessels bilaterally. The  anterior communicating artery is patent. Both A2 segments fill  normally.  The vertebral arteries are codominant. The vertebral arteries are  tortuous, swinging to the left. The PICA origins are visualized  and within normal limits bilaterally. The basilar artery is  tortuous but without significant stenosis. The left posterior  cerebral artery is fed by both a left P1 segment and posterior  communicating artery. There is signal loss and irregularity within  the proximal left posterior cerebral artery. The right posterior  cerebral artery is predominately of fetal type. The superior  cerebellar arteries appear be within normal limits bilaterally.  IMPRESSION:  1.  Tortuous proximal internal carotid arteries bilaterally without  significant stenosis.  2. Irregular narrowing of the proximal left posterior cerebral  artery, fed by both the posterior communicating artery and P1  segment without a focal stenosis of greater than 50%.  3. Mild to moderate diffuse small vessel disease.  Original Report Authenticated By: Jamesetta Orleans. MATTERN, M.D.    ASSESSMENT:  1. 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 She  presented initially as an MGUS in 1997 but evolved into myeloma within the last several years. S/P 2 cycles of Carfilzomib. S/P Revlimid and dexamethasone but developed Revlimid-induced skin toxicity with contraction of her skin of the legs as well as significant peripheral neuropathies. That is down to a grade 1 neuropathy now. Next, she was treated with Velcade and developed more neuropathic  symptomatology to the point that she could not walk and was always coming up here in a wheelchair. Could hardly get around at home. She has been most recently treated with Cytoxan and dexamethasone. That was marginally effective at best. 2. Grade 2 Peripheral Neuropathy, will follow closely.  Will not titrate carfilzomib dose and will remain at 20 mg/m2.  Will consider reducing if worsening of peripheral neuropathy. Gabapentin is not an option for this patient due to severe side effects of medication including aphasia.  3. ??? TIAs, referral to neurology made.  4. 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 Continue with chemotherapy as scheduled 2. Multiple Myeloma Panel in 2 weeks 3. Increase oxybutynin to 5 mg PO BID 4. Hold Omeprazole and take Dexilant as prescribed 60 mg daily 5. D/C acyclovir for cold sore prophylaxis.  Will re-initiate as needed. 6. Referral to Neurology complete.  Will recruit April Manson Research scientist (physical sciences)) to aid in the coordination of transportation.  7. Will maintain chemotherapy at 20 mg/m2.  Will consider reduction in dose if peripheral neuropathy worsens 8. Return for follow-up in 1 month   All questions were answered. The patient knows to call the clinic with any problems, questions or concerns. We can certainly see the patient much sooner if necessary.  I spent 25 minutes counseling the patient face to face. The total time spent in the appointment was 35 minutes.  Nihaal Friesen

## 2011-03-30 ENCOUNTER — Telehealth (HOSPITAL_COMMUNITY): Payer: Self-pay

## 2011-03-30 ENCOUNTER — Encounter (HOSPITAL_COMMUNITY): Payer: Medicare HMO | Admitting: Oncology

## 2011-03-30 ENCOUNTER — Telehealth (HOSPITAL_COMMUNITY): Payer: Self-pay | Admitting: Oncology

## 2011-03-30 ENCOUNTER — Ambulatory Visit (HOSPITAL_COMMUNITY): Payer: Medicare HMO | Admitting: Oncology

## 2011-03-30 NOTE — Telephone Encounter (Signed)
Message left to call clinic with any problems questions or concerns.

## 2011-03-31 LAB — URINE CULTURE: Culture  Setup Time: 201212041158

## 2011-04-01 ENCOUNTER — Other Ambulatory Visit (HOSPITAL_COMMUNITY): Payer: Self-pay | Admitting: Oncology

## 2011-04-01 DIAGNOSIS — N390 Urinary tract infection, site not specified: Secondary | ICD-10-CM

## 2011-04-01 MED ORDER — SULFAMETHOXAZOLE-TRIMETHOPRIM 800-160 MG PO TABS: 1.0000 | ORAL_TABLET | Freq: Two times a day (BID) | ORAL | Status: AC

## 2011-04-06 ENCOUNTER — Encounter: Payer: Self-pay | Admitting: Oncology

## 2011-04-11 ENCOUNTER — Encounter (HOSPITAL_BASED_OUTPATIENT_CLINIC_OR_DEPARTMENT_OTHER): Payer: Medicare HMO

## 2011-04-11 VITALS — BP 110/69 | HR 65 | Temp 98.6°F | Resp 18

## 2011-04-11 DIAGNOSIS — C9000 Multiple myeloma not having achieved remission: Secondary | ICD-10-CM

## 2011-04-11 DIAGNOSIS — R52 Pain, unspecified: Secondary | ICD-10-CM

## 2011-04-11 DIAGNOSIS — Z5111 Encounter for antineoplastic chemotherapy: Secondary | ICD-10-CM

## 2011-04-11 DIAGNOSIS — E875 Hyperkalemia: Secondary | ICD-10-CM

## 2011-04-11 DIAGNOSIS — D649 Anemia, unspecified: Secondary | ICD-10-CM

## 2011-04-11 LAB — CBC
MCH: 31.8 pg (ref 26.0–34.0)
Platelets: 154 10*3/uL (ref 150–400)
RBC: 2.61 MIL/uL — ABNORMAL LOW (ref 3.87–5.11)
RDW: 15.7 % — ABNORMAL HIGH (ref 11.5–15.5)
WBC: 4.9 10*3/uL (ref 4.0–10.5)

## 2011-04-11 LAB — DIFFERENTIAL
Basophils Absolute: 0 10*3/uL (ref 0.0–0.1)
Eosinophils Absolute: 0.1 10*3/uL (ref 0.0–0.7)
Lymphocytes Relative: 19 % (ref 12–46)
Lymphs Abs: 0.9 10*3/uL (ref 0.7–4.0)
Neutrophils Relative %: 70 % (ref 43–77)

## 2011-04-11 LAB — COMPREHENSIVE METABOLIC PANEL
CO2: 18 mEq/L — ABNORMAL LOW (ref 19–32)
Calcium: 10 mg/dL (ref 8.4–10.5)
Chloride: 106 mEq/L (ref 96–112)
Creatinine, Ser: 3.14 mg/dL — ABNORMAL HIGH (ref 0.50–1.10)
GFR calc Af Amer: 16 mL/min — ABNORMAL LOW (ref >90)
GFR calc non Af Amer: 14 mL/min — ABNORMAL LOW (ref >90)
Glucose, Bld: 113 mg/dL — ABNORMAL HIGH (ref 70–99)
Total Bilirubin: 0.4 mg/dL (ref 0.3–1.2)

## 2011-04-11 MED ORDER — OXYCODONE-ACETAMINOPHEN 5-325 MG PO TABS
1.0000 | ORAL_TABLET | Freq: Once | ORAL | Status: AC
  Administered 2011-04-11: 1 via ORAL
  Filled 2011-04-11: qty 1

## 2011-04-11 MED ORDER — HEPARIN SOD (PORK) LOCK FLUSH 100 UNIT/ML IV SOLN
INTRAVENOUS | Status: AC
  Administered 2011-04-11: 500 [IU]
  Filled 2011-04-11: qty 5

## 2011-04-11 MED ORDER — SODIUM CHLORIDE 0.9 % IJ SOLN
INTRAMUSCULAR | Status: AC
  Filled 2011-04-11: qty 10

## 2011-04-11 MED ORDER — SODIUM POLYSTYRENE SULFONATE 15 GM/60ML PO SUSP
30.0000 g | Freq: Once | ORAL | Status: AC
  Administered 2011-04-11: 30 g via ORAL
  Filled 2011-04-11: qty 120

## 2011-04-11 MED ORDER — SODIUM CHLORIDE 0.9 % IJ SOLN
10.0000 mL | INTRAMUSCULAR | Status: DC | PRN
  Administered 2011-04-11: 10 mL
  Filled 2011-04-11: qty 10

## 2011-04-11 MED ORDER — SODIUM CHLORIDE 0.9 % IV SOLN
Freq: Once | INTRAVENOUS | Status: AC
  Administered 2011-04-11: 1000 mL via INTRAVENOUS

## 2011-04-11 MED ORDER — HEPARIN SOD (PORK) LOCK FLUSH 100 UNIT/ML IV SOLN
500.0000 [IU] | Freq: Once | INTRAVENOUS | Status: AC | PRN
  Administered 2011-04-11: 500 [IU]
  Filled 2011-04-11: qty 5

## 2011-04-11 NOTE — Patient Instructions (Signed)
[facility] Discharge Instructions for Patients Receiving Chemotherapy No chemo until your blood work is better. Today you received fluids and blood transfusion.   Your potassium is high and we gave you a medicine to lower your potassium. It may cause diarrhea. Do not take any oral potassium until further notice.  Return tomorrow for more fluids and to check your labs.  BELOW ARE SYMPTOMS THAT SHOULD BE REPORTED IMMEDIATELY:  *FEVER GREATER THAN 101.0 F  *CHILLS WITH OR WITHOUT FEVER  NAUSEA AND VOMITING THAT IS NOT CONTROLLED WITH YOUR NAUSEA MEDICATION  *UNUSUAL SHORTNESS OF BREATH  *UNUSUAL BRUISING OR BLEEDING  TENDERNESS IN MOUTH AND THROAT WITH OR WITHOUT PRESENCE OF ULCERS  *URINARY PROBLEMS  *BOWEL PROBLEMS  UNUSUAL RASH Items with * indicate a potential emergency and should be followed up as soon as possible.  One of the nurses will contact you 24 hours after your treatment. Please let the nurse know about any problems that you may have experienced. Feel free to call the clinic you have any questions or concerns. The clinic phone number is [phone].   I have been informed and understand all the instructions given to me. I know to contact the clinic, my physician, or go to the Emergency Department if any problems should occur. I do not have any questions at this time, but understand that I may call the clinic during office hours or the Patient Navigator at [phone] should I have any questions or need assistance in obtaining follow up care.    __________________________________________  _____________  __________ Signature of Patient or Authorized Representative            Date                   Time    __________________________________________ Nurse's Signature

## 2011-04-11 NOTE — Progress Notes (Signed)
Chemo held due to elevated creatine. Pt notified that we will do fluids and blood today and treat for elevated potassium. Verbalized understanding to stop oral potassium. Will return 12/18 for fluids.

## 2011-04-12 ENCOUNTER — Encounter (HOSPITAL_BASED_OUTPATIENT_CLINIC_OR_DEPARTMENT_OTHER): Payer: Medicare HMO

## 2011-04-12 VITALS — BP 123/81 | HR 76 | Temp 98.4°F | Ht 65.0 in | Wt 190.0 lb

## 2011-04-12 DIAGNOSIS — E875 Hyperkalemia: Secondary | ICD-10-CM

## 2011-04-12 DIAGNOSIS — Z5111 Encounter for antineoplastic chemotherapy: Secondary | ICD-10-CM

## 2011-04-12 DIAGNOSIS — C9000 Multiple myeloma not having achieved remission: Secondary | ICD-10-CM

## 2011-04-12 LAB — BASIC METABOLIC PANEL
BUN: 32 mg/dL — ABNORMAL HIGH (ref 6–23)
Calcium: 9.9 mg/dL (ref 8.4–10.5)
GFR calc Af Amer: 24 mL/min — ABNORMAL LOW (ref >90)
GFR calc non Af Amer: 20 mL/min — ABNORMAL LOW (ref >90)
Glucose, Bld: 91 mg/dL (ref 70–99)
Sodium: 135 mEq/L (ref 135–145)

## 2011-04-12 LAB — TYPE AND SCREEN
ABO/RH(D): A POS
Antibody Screen: NEGATIVE
Unit division: 0
Unit division: 0

## 2011-04-12 LAB — KAPPA/LAMBDA LIGHT CHAINS: Lambda free light chains: 3.73 mg/dL — ABNORMAL HIGH (ref 0.57–2.63)

## 2011-04-12 MED ORDER — SODIUM CHLORIDE 0.9 % IJ SOLN
INTRAMUSCULAR | Status: AC
  Filled 2011-04-12: qty 10

## 2011-04-12 MED ORDER — SODIUM CHLORIDE 0.9 % IJ SOLN
10.0000 mL | Freq: Once | INTRAMUSCULAR | Status: AC
  Administered 2011-04-12: 10 mL via INTRAVENOUS
  Filled 2011-04-12: qty 10

## 2011-04-12 MED ORDER — HEPARIN SOD (PORK) LOCK FLUSH 100 UNIT/ML IV SOLN
500.0000 [IU] | Freq: Once | INTRAVENOUS | Status: AC
  Administered 2011-04-12: 500 [IU] via INTRAVENOUS
  Filled 2011-04-12: qty 5

## 2011-04-12 MED ORDER — HEPARIN SOD (PORK) LOCK FLUSH 100 UNIT/ML IV SOLN
INTRAVENOUS | Status: AC
  Administered 2011-04-12: 500 [IU] via INTRAVENOUS
  Filled 2011-04-12: qty 5

## 2011-04-12 MED ORDER — SODIUM CHLORIDE 0.9 % IV SOLN
Freq: Once | INTRAVENOUS | Status: AC
  Administered 2011-04-12: 1000 mL via INTRAVENOUS

## 2011-04-12 NOTE — Patient Instructions (Signed)
[facility]  Discharge Instructions  RECOMMENDATIONS MADE BY THE CONSULTANT AND ANY TEST RESULTS WILL BE SENT TO YOUR REFERRING DOCTOR.     MEDICATIONS: NO potassium! You may start back your other meds tomorrow morning.  SPECIAL INSTRUCTIONS/FOLLOW-UP: Continue to drink 6-8 glasses of fluid a day. Return next week as scheduled.   I acknowledge that I have been informed and understand all the instructions given to me and received a copy. I do not have any more questions at this time, but understand that I may call the Specialty Clinic at [facility] at [phone] during business hours should I have any further questions or need assistance in obtaining follow-up care.    __________________________________________  _____________  __________ Signature of Patient or Authorized Representative            Date                   Time    __________________________________________ Nurse's Signature

## 2011-04-12 NOTE — Progress Notes (Signed)
Tolerated well

## 2011-04-13 LAB — MULTIPLE MYELOMA PANEL, SERUM
Albumin ELP: 59.1 % (ref 55.8–66.1)
Beta Globulin: 5.2 % (ref 4.7–7.2)
IgA: 34 mg/dL — ABNORMAL LOW (ref 69–380)
IgG (Immunoglobin G), Serum: 831 mg/dL (ref 690–1700)
IgM, Serum: 21 mg/dL — ABNORMAL LOW (ref 52–322)
Total Protein: 6.5 g/dL (ref 6.0–8.3)

## 2011-04-14 ENCOUNTER — Encounter (HOSPITAL_COMMUNITY): Payer: Medicare HMO

## 2011-04-20 ENCOUNTER — Encounter (HOSPITAL_BASED_OUTPATIENT_CLINIC_OR_DEPARTMENT_OTHER): Payer: Medicare HMO

## 2011-04-20 DIAGNOSIS — Z5112 Encounter for antineoplastic immunotherapy: Secondary | ICD-10-CM

## 2011-04-20 DIAGNOSIS — C9000 Multiple myeloma not having achieved remission: Secondary | ICD-10-CM

## 2011-04-20 LAB — DIFFERENTIAL
Basophils Absolute: 0 10*3/uL (ref 0.0–0.1)
Basophils Relative: 0 % (ref 0–1)
Eosinophils Absolute: 0.1 10*3/uL (ref 0.0–0.7)
Neutro Abs: 3.5 10*3/uL (ref 1.7–7.7)
Neutrophils Relative %: 71 % (ref 43–77)

## 2011-04-20 LAB — CBC
MCH: 31.8 pg (ref 26.0–34.0)
MCHC: 32.9 g/dL (ref 30.0–36.0)
Platelets: 177 10*3/uL (ref 150–400)
RBC: 3.33 MIL/uL — ABNORMAL LOW (ref 3.87–5.11)

## 2011-04-20 LAB — COMPREHENSIVE METABOLIC PANEL
ALT: 12 U/L (ref 0–35)
AST: 14 U/L (ref 0–37)
Albumin: 3.9 g/dL (ref 3.5–5.2)
CO2: 22 mEq/L (ref 19–32)
Chloride: 106 mEq/L (ref 96–112)
GFR calc non Af Amer: 28 mL/min — ABNORMAL LOW (ref >90)
Sodium: 137 mEq/L (ref 135–145)
Total Bilirubin: 0.4 mg/dL (ref 0.3–1.2)

## 2011-04-20 MED ORDER — HEPARIN SOD (PORK) LOCK FLUSH 100 UNIT/ML IV SOLN
INTRAVENOUS | Status: AC
  Filled 2011-04-20: qty 5

## 2011-04-20 MED ORDER — DEXTROSE 5 % IV SOLN
20.0000 mg/m2 | Freq: Once | INTRAVENOUS | Status: AC
  Administered 2011-04-20: 40 mg via INTRAVENOUS
  Filled 2011-04-20: qty 20

## 2011-04-20 MED ORDER — DEXAMETHASONE SODIUM PHOSPHATE 10 MG/ML IJ SOLN: 10.0000 mg | Freq: Once | INTRAMUSCULAR | Status: DC

## 2011-04-20 MED ORDER — SODIUM CHLORIDE 0.9 % IV SOLN
Freq: Once | INTRAVENOUS | Status: AC
  Administered 2011-04-20: 8 mg via INTRAVENOUS
  Filled 2011-04-20: qty 4

## 2011-04-20 MED ORDER — SODIUM CHLORIDE 0.9 % IV SOLN: 8.0000 mg | Freq: Once | INTRAVENOUS | Status: DC

## 2011-04-20 MED ORDER — SODIUM CHLORIDE 0.9 % IV SOLN
Freq: Once | INTRAVENOUS | Status: AC
  Administered 2011-04-20: 10:00:00 via INTRAVENOUS

## 2011-04-20 MED ORDER — HEPARIN SOD (PORK) LOCK FLUSH 100 UNIT/ML IV SOLN
500.0000 [IU] | Freq: Once | INTRAVENOUS | Status: AC | PRN
  Administered 2011-04-20: 500 [IU]
  Filled 2011-04-20: qty 5

## 2011-04-20 MED ORDER — SODIUM CHLORIDE 0.9 % IV SOLN
8.0000 mg | Freq: Once | INTRAVENOUS | Status: DC
  Filled 2011-04-20: qty 4

## 2011-04-21 ENCOUNTER — Encounter (HOSPITAL_BASED_OUTPATIENT_CLINIC_OR_DEPARTMENT_OTHER): Payer: Medicare HMO

## 2011-04-21 VITALS — BP 127/76 | HR 52 | Temp 97.2°F | Wt 193.0 lb

## 2011-04-21 DIAGNOSIS — Z5112 Encounter for antineoplastic immunotherapy: Secondary | ICD-10-CM

## 2011-04-21 DIAGNOSIS — C9000 Multiple myeloma not having achieved remission: Secondary | ICD-10-CM

## 2011-04-21 MED ORDER — SODIUM CHLORIDE 0.9 % IJ SOLN
INTRAMUSCULAR | Status: AC
  Filled 2011-04-21: qty 10

## 2011-04-21 MED ORDER — SODIUM CHLORIDE 0.9 % IJ SOLN
10.0000 mL | INTRAMUSCULAR | Status: DC | PRN
  Filled 2011-04-21: qty 10

## 2011-04-21 MED ORDER — SODIUM CHLORIDE 0.9 % IV SOLN: 8.0000 mg | Freq: Once | INTRAVENOUS | Status: DC

## 2011-04-21 MED ORDER — HEPARIN SOD (PORK) LOCK FLUSH 100 UNIT/ML IV SOLN
500.0000 [IU] | Freq: Once | INTRAVENOUS | Status: AC | PRN
  Administered 2011-04-21: 500 [IU]
  Filled 2011-04-21: qty 5

## 2011-04-21 MED ORDER — HEPARIN SOD (PORK) LOCK FLUSH 100 UNIT/ML IV SOLN
INTRAVENOUS | Status: AC
  Filled 2011-04-21: qty 5

## 2011-04-21 MED ORDER — SODIUM CHLORIDE 0.9 % IV SOLN
Freq: Once | INTRAVENOUS | Status: AC
  Administered 2011-04-21: 8 mg via INTRAVENOUS
  Filled 2011-04-21: qty 4

## 2011-04-21 MED ORDER — DEXAMETHASONE SODIUM PHOSPHATE 10 MG/ML IJ SOLN: 10.0000 mg | Freq: Once | INTRAMUSCULAR | Status: DC

## 2011-04-21 MED ORDER — DEXTROSE 5 % IV SOLN
20.0000 mg/m2 | Freq: Once | INTRAVENOUS | Status: AC
  Administered 2011-04-21: 40 mg via INTRAVENOUS
  Filled 2011-04-21: qty 20

## 2011-04-21 MED ORDER — SODIUM CHLORIDE 0.9 % IV SOLN
Freq: Once | INTRAVENOUS | Status: AC
  Administered 2011-04-21: 10:00:00 via INTRAVENOUS

## 2011-04-24 ENCOUNTER — Telehealth (HOSPITAL_COMMUNITY): Payer: Self-pay

## 2011-04-24 NOTE — Telephone Encounter (Signed)
24 hour chemo follow up:  Patient feeling well.  Slightly tired but states that is her normal.  She was feeling constipated this AM but was able to have a bowel movement and is going to use stool softeners today to prevent further constipation.  No further complaints.

## 2011-04-27 ENCOUNTER — Encounter (HOSPITAL_COMMUNITY): Payer: Medicare HMO | Admitting: Oncology

## 2011-04-27 ENCOUNTER — Encounter (HOSPITAL_COMMUNITY): Payer: Medicare HMO | Attending: Oncology

## 2011-04-27 DIAGNOSIS — C9000 Multiple myeloma not having achieved remission: Secondary | ICD-10-CM | POA: Insufficient documentation

## 2011-04-27 DIAGNOSIS — K219 Gastro-esophageal reflux disease without esophagitis: Secondary | ICD-10-CM | POA: Insufficient documentation

## 2011-04-27 DIAGNOSIS — R609 Edema, unspecified: Secondary | ICD-10-CM | POA: Insufficient documentation

## 2011-04-27 DIAGNOSIS — Z5112 Encounter for antineoplastic immunotherapy: Secondary | ICD-10-CM

## 2011-04-27 DIAGNOSIS — H9209 Otalgia, unspecified ear: Secondary | ICD-10-CM

## 2011-04-27 DIAGNOSIS — D649 Anemia, unspecified: Secondary | ICD-10-CM | POA: Insufficient documentation

## 2011-04-27 DIAGNOSIS — R0602 Shortness of breath: Secondary | ICD-10-CM | POA: Insufficient documentation

## 2011-04-27 DIAGNOSIS — R1013 Epigastric pain: Secondary | ICD-10-CM

## 2011-04-27 LAB — DIFFERENTIAL
Basophils Absolute: 0 10*3/uL (ref 0.0–0.1)
Basophils Relative: 0 % (ref 0–1)
Eosinophils Absolute: 0.1 10*3/uL (ref 0.0–0.7)
Eosinophils Relative: 1 % (ref 0–5)
Monocytes Absolute: 0.7 10*3/uL (ref 0.1–1.0)
Monocytes Relative: 9 % (ref 3–12)

## 2011-04-27 LAB — COMPREHENSIVE METABOLIC PANEL
AST: 10 U/L (ref 0–37)
Albumin: 3.2 g/dL — ABNORMAL LOW (ref 3.5–5.2)
BUN: 19 mg/dL (ref 6–23)
Calcium: 9.4 mg/dL (ref 8.4–10.5)
Chloride: 110 mEq/L (ref 96–112)
Creatinine, Ser: 1.35 mg/dL — ABNORMAL HIGH (ref 0.50–1.10)
Total Bilirubin: 0.4 mg/dL (ref 0.3–1.2)

## 2011-04-27 LAB — CBC
HCT: 28.5 % — ABNORMAL LOW (ref 36.0–46.0)
Hemoglobin: 9.4 g/dL — ABNORMAL LOW (ref 12.0–15.0)
MCH: 31.8 pg (ref 26.0–34.0)
MCHC: 33 g/dL (ref 30.0–36.0)
MCV: 96.3 fL (ref 78.0–100.0)
RDW: 14.5 % (ref 11.5–15.5)

## 2011-04-27 MED ORDER — SODIUM CHLORIDE 0.9 % IV SOLN
Freq: Once | INTRAVENOUS | Status: AC
  Administered 2011-04-27: 11:00:00 via INTRAVENOUS

## 2011-04-27 MED ORDER — SODIUM CHLORIDE 0.9 % IJ SOLN
INTRAMUSCULAR | Status: AC
  Filled 2011-04-27: qty 10

## 2011-04-27 MED ORDER — DEXAMETHASONE SODIUM PHOSPHATE 10 MG/ML IJ SOLN: 10.0000 mg | Freq: Once | INTRAMUSCULAR | Status: DC

## 2011-04-27 MED ORDER — HEPARIN SOD (PORK) LOCK FLUSH 100 UNIT/ML IV SOLN
500.0000 [IU] | Freq: Once | INTRAVENOUS | Status: AC | PRN
  Administered 2011-04-27: 500 [IU]
  Filled 2011-04-27: qty 5

## 2011-04-27 MED ORDER — SODIUM CHLORIDE 0.9 % IV SOLN
Freq: Once | INTRAVENOUS | Status: AC
  Administered 2011-04-27: 12:00:00 via INTRAVENOUS

## 2011-04-27 MED ORDER — DEXTROSE 5 % IV SOLN
20.0000 mg/m2 | Freq: Once | INTRAVENOUS | Status: AC
  Administered 2011-04-27: 40 mg via INTRAVENOUS
  Filled 2011-04-27: qty 20

## 2011-04-27 MED ORDER — SODIUM CHLORIDE 0.9 % IV SOLN: 8.0000 mg | Freq: Once | INTRAVENOUS | Status: DC

## 2011-04-27 MED ORDER — HEPARIN SOD (PORK) LOCK FLUSH 100 UNIT/ML IV SOLN
INTRAVENOUS | Status: AC
  Administered 2011-04-27: 500 [IU]
  Filled 2011-04-27: qty 5

## 2011-04-27 MED ORDER — SODIUM CHLORIDE 0.9 % IJ SOLN
10.0000 mL | INTRAMUSCULAR | Status: DC | PRN
  Administered 2011-04-27: 10 mL
  Filled 2011-04-27: qty 10

## 2011-04-27 MED ORDER — SODIUM CHLORIDE 0.9 % IV SOLN
Freq: Once | INTRAVENOUS | Status: AC
  Administered 2011-04-27: 8 mg via INTRAVENOUS
  Filled 2011-04-27: qty 4

## 2011-04-27 MED ORDER — SUCRALFATE 1 GM/10ML PO SUSP: 1.0000 g | Freq: Four times a day (QID) | ORAL | Status: AC

## 2011-04-27 NOTE — Progress Notes (Signed)
Tolerated chemo well. 

## 2011-04-28 ENCOUNTER — Encounter (HOSPITAL_BASED_OUTPATIENT_CLINIC_OR_DEPARTMENT_OTHER): Payer: Medicare HMO

## 2011-04-28 VITALS — BP 151/73 | HR 59 | Temp 98.6°F | Wt 202.0 lb

## 2011-04-28 DIAGNOSIS — Z5112 Encounter for antineoplastic immunotherapy: Secondary | ICD-10-CM

## 2011-04-28 DIAGNOSIS — C9000 Multiple myeloma not having achieved remission: Secondary | ICD-10-CM

## 2011-04-28 MED ORDER — DEXAMETHASONE SODIUM PHOSPHATE 10 MG/ML IJ SOLN: 10.0000 mg | Freq: Once | INTRAMUSCULAR | Status: DC

## 2011-04-28 MED ORDER — SODIUM CHLORIDE 0.9 % IV SOLN
Freq: Once | INTRAVENOUS | Status: AC
  Administered 2011-04-28: 8 mg via INTRAVENOUS
  Filled 2011-04-28: qty 4

## 2011-04-28 MED ORDER — HEPARIN SOD (PORK) LOCK FLUSH 100 UNIT/ML IV SOLN
500.0000 [IU] | Freq: Once | INTRAVENOUS | Status: AC | PRN
  Administered 2011-04-28: 500 [IU]
  Filled 2011-04-28: qty 5

## 2011-04-28 MED ORDER — DEXTROSE 5 % IV SOLN
20.0000 mg/m2 | Freq: Once | INTRAVENOUS | Status: AC
  Administered 2011-04-28: 40 mg via INTRAVENOUS
  Filled 2011-04-28: qty 20

## 2011-04-28 MED ORDER — SODIUM CHLORIDE 0.9 % IV SOLN
Freq: Once | INTRAVENOUS | Status: AC
  Administered 2011-04-28: 10:00:00 via INTRAVENOUS

## 2011-04-28 MED ORDER — SODIUM CHLORIDE 0.9 % IV SOLN: 8.0000 mg | Freq: Once | INTRAVENOUS | Status: DC

## 2011-04-28 MED ORDER — HEPARIN SOD (PORK) LOCK FLUSH 100 UNIT/ML IV SOLN
INTRAVENOUS | Status: AC
  Filled 2011-04-28: qty 5

## 2011-04-28 MED ORDER — SODIUM CHLORIDE 0.9 % IJ SOLN
INTRAMUSCULAR | Status: AC
  Administered 2011-04-28: 10 mL
  Filled 2011-04-28: qty 10

## 2011-04-28 MED ORDER — SODIUM CHLORIDE 0.9 % IJ SOLN
10.0000 mL | INTRAMUSCULAR | Status: DC | PRN
  Administered 2011-04-28 (×2): 10 mL
  Filled 2011-04-28: qty 10

## 2011-04-28 NOTE — Progress Notes (Signed)
Infusion complete, patient tolerated well.   

## 2011-04-29 ENCOUNTER — Telehealth (HOSPITAL_COMMUNITY): Payer: Self-pay

## 2011-04-29 NOTE — Progress Notes (Signed)
Patient seen in the chemotherapy room to discuss two complaints that she has.  The first complaint is left sided ear pain after she inserted her index finger into it to scratch her ear canal.  She reports that the discomfort began right after the removal of her finger from the ear canal.  She denies any fevers or chills.  She request that I perform an otoscopic exam to see if there are any abnormalities.  Her second complaint is epigastric discomfort.  She reports that she is taking 40 mg of Prilosec daily.  She has also try Maalox/Mylanta with no relief.  She does not report any pattern to this discomfort.  She denies it worsening at night laying in the supine position.  She denies it worsening with food consumption.  She denies increased discomfort with spicy and acidic foods.  She reports that it feels like Acid Reflux, but she does not feel the symptoms in her "throat like it usually does."  Instead this discomfort remains in the epigastric region.  The patient denies any constipation or diarrhea.  She denies any blood in her stool, black tarry stool, and mucous in her stool.  General: Patient seen in a chemotherapy chair in a lounging position.  She does not appear to be in acute distress.  She appears very pleasant.  A and O x3. Ear: Bilateral ear exam does not reveal any canal erythema, erythema of the tympanic membrane, or bulging of the tympanic membrane.  The cone of light is appreciated in the 7 o'clock position bilaterally. There is no effusion deep to the tympanic membrane.  The tympanic membrane is noted to be pearly white in color.   Abdomen:  Moderate tenderness noted on palpation of the epigastric region.  No other pain noted.  Neuro: A and O x 3.  No focal deficits.   Assessment/Plan 1. Left ear pain/discomfort.  Will monitor.  No infection noted.  No indication for antibiotic. 2. Epigastric abdominal pain, questionable gastric ulcer.  Patient is taking 40 mg of Prilosec daily.  Will  add 1 gram Carafate in suspension/solution four times daily.  She will continue with the Prilosec and the Carafate is in addition to her PPI.  She will report in one weeks time with her status.  If no better or worse, will refer patient to GI.  All questions were answered.  The patient knows to call the clinic with any questions or concerns.    Patient and plan discussed with Dr. Glenford Peers and he is in agreement with the aforementioned.  Patient seen and examined by Dr. Glenford Peers.  KEFALAS,THOMAS

## 2011-04-29 NOTE — Progress Notes (Signed)
Addended by: Ellouise Newer III on: 04/29/2011 11:54 PM   Modules accepted: Level of Service

## 2011-04-29 NOTE — Telephone Encounter (Signed)
Stated did well after chemo.

## 2011-05-03 NOTE — Progress Notes (Signed)
This encounter was created in error - please disregard.

## 2011-05-05 ENCOUNTER — Other Ambulatory Visit: Payer: Self-pay

## 2011-05-05 ENCOUNTER — Other Ambulatory Visit (HOSPITAL_COMMUNITY): Payer: Self-pay | Admitting: Oncology

## 2011-05-05 ENCOUNTER — Encounter (HOSPITAL_BASED_OUTPATIENT_CLINIC_OR_DEPARTMENT_OTHER): Payer: Medicare HMO

## 2011-05-05 ENCOUNTER — Ambulatory Visit (HOSPITAL_COMMUNITY)
Admission: RE | Admit: 2011-05-05 | Discharge: 2011-05-05 | Disposition: A | Discharge status: Home / Self Care | Payer: Medicare HMO | Source: Ambulatory Visit | Attending: Oncology | Admitting: Oncology

## 2011-05-05 VITALS — BP 182/85 | HR 62 | Temp 99.1°F

## 2011-05-05 DIAGNOSIS — R600 Localized edema: Secondary | ICD-10-CM

## 2011-05-05 DIAGNOSIS — R609 Edema, unspecified: Secondary | ICD-10-CM | POA: Insufficient documentation

## 2011-05-05 DIAGNOSIS — R002 Palpitations: Secondary | ICD-10-CM | POA: Insufficient documentation

## 2011-05-05 DIAGNOSIS — R0602 Shortness of breath: Secondary | ICD-10-CM | POA: Insufficient documentation

## 2011-05-05 DIAGNOSIS — I10 Essential (primary) hypertension: Secondary | ICD-10-CM | POA: Insufficient documentation

## 2011-05-05 DIAGNOSIS — I2510 Atherosclerotic heart disease of native coronary artery without angina pectoris: Secondary | ICD-10-CM | POA: Insufficient documentation

## 2011-05-05 DIAGNOSIS — C9000 Multiple myeloma not having achieved remission: Secondary | ICD-10-CM

## 2011-05-05 LAB — DIFFERENTIAL
Eosinophils Absolute: 0 10*3/uL (ref 0.0–0.7)
Eosinophils Relative: 1 % (ref 0–5)
Lymphs Abs: 0.8 10*3/uL (ref 0.7–4.0)
Monocytes Relative: 9 % (ref 3–12)

## 2011-05-05 LAB — CBC
HCT: 25.6 % — ABNORMAL LOW (ref 36.0–46.0)
Hemoglobin: 8.4 g/dL — ABNORMAL LOW (ref 12.0–15.0)
MCH: 31.7 pg (ref 26.0–34.0)
MCV: 96.6 fL (ref 78.0–100.0)
RBC: 2.65 MIL/uL — ABNORMAL LOW (ref 3.87–5.11)

## 2011-05-05 LAB — COMPREHENSIVE METABOLIC PANEL
Alkaline Phosphatase: 30 U/L — ABNORMAL LOW (ref 39–117)
BUN: 13 mg/dL (ref 6–23)
CO2: 28 mEq/L (ref 19–32)
Calcium: 9.2 mg/dL (ref 8.4–10.5)
GFR calc Af Amer: 53 mL/min — ABNORMAL LOW (ref >90)
GFR calc non Af Amer: 45 mL/min — ABNORMAL LOW (ref >90)
Glucose, Bld: 87 mg/dL (ref 70–99)
Total Protein: 5.5 g/dL — ABNORMAL LOW (ref 6.0–8.3)

## 2011-05-05 LAB — PRO B NATRIURETIC PEPTIDE: Pro B Natriuretic peptide (BNP): 942.2 pg/mL — ABNORMAL HIGH (ref 0–125)

## 2011-05-05 MED ORDER — SODIUM CHLORIDE 0.9 % IJ SOLN
INTRAMUSCULAR | Status: AC
  Filled 2011-05-05: qty 10

## 2011-05-05 MED ORDER — FUROSEMIDE 40 MG PO TABS: 40.0000 mg | ORAL_TABLET | Freq: Once | ORAL | Status: DC

## 2011-05-05 MED ORDER — FUROSEMIDE 20 MG PO TABS: 40.0000 mg | ORAL_TABLET | Freq: Two times a day (BID) | ORAL | Status: DC

## 2011-05-05 MED ORDER — SODIUM CHLORIDE 0.9 % IJ SOLN
10.0000 mL | INTRAMUSCULAR | Status: DC | PRN
  Administered 2011-05-05: 10 mL via INTRAVENOUS
  Filled 2011-05-05: qty 10

## 2011-05-05 MED ORDER — FUROSEMIDE 40 MG PO TABS
40.0000 mg | ORAL_TABLET | Freq: Once | ORAL | Status: AC
  Administered 2011-05-05: 40 mg via ORAL
  Filled 2011-05-05: qty 1

## 2011-05-05 NOTE — Progress Notes (Signed)
1030  Presented for chemo today with 3+ pitting edema to lower extremities. Labs done via port,  .Also EKG and CXR per T.   Kefalas PA-C 1430 lasix 40 mg give po for edema in legs.  Prescription given to take at home.  Chemo held for present.

## 2011-05-05 NOTE — Progress Notes (Signed)
Subjective: Patient reports that the "day before yesterday" (Tuesday 05/03/2011) she started having peripheral edema.  She noted it in the UE B/L and LE B/L.  Today, the UE edema has resolved. The LE edema remains and is seeping fluid.    She also notes a "fluttering" in her chest but this is no worse or new.  It is intermittent and resolves on its own.  No precipitating factors.   Objective: BP 182/85  Pulse 62  Temp(Src) 99.1 F (37.3 C) (Oral) Gen: Patient seen in chemotherapy chair reclining in no acute distress.  A and O x 3.  HEENT: Atraumatic normocephalic. Neck: Trachea midline Cardiac: RRR without murmur rub gallop.   Lungs: CTA B/L Extremities: 4+ LE pitting edema with seeping of fluid and cracks in skin.  Skin is thin and shiny.  UE does not reveal any edema and has resolved according to the patient.  Neuro: No gross deficits.  A and O x 3  Assessment: 1. B/L LE 4+ pitting edema 2. "Fluttering" in chest  Plan: 1. Lab work today: CBC diff, CMET, BNP 2. EKG now 3. Chest X-ray, report pending 4. Hold chemotherapy today and tomorrow. 5. Lasix 40 mg now, tonight, and tomorrow AM. 6. Will discuss the patient's case with Dr. Mariel Sleet tomorrow. 7. Rx for Lasix 40 mg # 15 to be taken as directed. 8. Return tomorrow for follow-up of this issue.   9. May need to consider a Cardiac Echo.    Discussed patient's case and plan with Dr. Arlan Organ and she is in agreement with the aforementioned.   All questions were answered.   Wilder Kurowski

## 2011-05-06 ENCOUNTER — Encounter (HOSPITAL_BASED_OUTPATIENT_CLINIC_OR_DEPARTMENT_OTHER): Payer: Medicare HMO | Admitting: Oncology

## 2011-05-06 ENCOUNTER — Inpatient Hospital Stay (HOSPITAL_COMMUNITY): Payer: Medicare HMO

## 2011-05-06 DIAGNOSIS — R609 Edema, unspecified: Secondary | ICD-10-CM

## 2011-05-06 DIAGNOSIS — R600 Localized edema: Secondary | ICD-10-CM

## 2011-05-06 DIAGNOSIS — R0602 Shortness of breath: Secondary | ICD-10-CM

## 2011-05-06 DIAGNOSIS — D649 Anemia, unspecified: Secondary | ICD-10-CM

## 2011-05-06 DIAGNOSIS — M79609 Pain in unspecified limb: Secondary | ICD-10-CM

## 2011-05-06 DIAGNOSIS — C9000 Multiple myeloma not having achieved remission: Secondary | ICD-10-CM

## 2011-05-06 LAB — TYPE AND SCREEN
ABO/RH(D): A POS
Antibody Screen: NEGATIVE
Unit division: 0

## 2011-05-06 NOTE — Progress Notes (Signed)
Diane Stalling, MD [address] [place] [address]  1. Leg edema  2D Echocardiogram without contrast  2. SOB (shortness of breath) on exertion  2D Echocardiogram without contrast  3. MULTIPLE  MYELOMA    4. LEG PAIN      CURRENT THERAPY:S/P 3 day 9 cycles of Carfilzomib.  Her last 2 days were deferred one week due B/L LE 4+ pitting edema and concerns for CHF.  S/P Revlimid and dexamethasone but developed Revlimid-induced skin toxicity with contraction of her skin of the legs as well as significant peripheral neuropathies. That is down to a grade 1 neuropathy now. Next, she was treated with Velcade and developed more neuropathic symptomatology to the point that she could not walk and was always coming up here in a wheelchair. Could hardly get around at home. She has been most recently treated with Cytoxan and dexamethasone. That was marginally effective at best.   INTERVAL HISTORY: [name] 71 y.o. female returns for  regular  visit for followup of IgG lambda multiple myeloma with 2 M spikes. She presented initially as an MGUS in 1997 but evolved into myeloma.  The patient is seen today in follow-up to an encounter we had yesterday.  Yesterday she was here for her chemotherapy, but it was held due to sudden onset (3 days) of LE B/L 4+ pitting edema that is leaking serous fluids.  She reports resolution of her UE edema.  We started her on Lasix 40 mg BID x 2 days and we will now decrease to once daily in conjunction with Spironolactone M/W/F.  Yesterday we performed a EKG that reveals PACs, and good r-wave progression.  There is a p-wave with every QRS.  We also performed a plain film of chest which was unremarkable. Lab work was performed which revealed in increased pBNP.  I personally reviewed and went over laboratory results with the patient.  I personally reviewed and went over radiographic studies with the patient.  The patient reports her LE is slightly improved, but are  still sore and leaking fluid.    Past Medical History  Diagnosis Date  . Hypertension   . Coronary artery disease   . Diabetes mellitus     borderline  . GERD (gastroesophageal reflux disease)   . Cancer     colon: takes chemo  . Hypercholesteremia   . Multiple myeloma   . Female stress incontinence   . UTI (urinary tract infection) 11/05/2010  . Vertigo 01/06/2011  . Poor sleep pattern 01/06/2011    has MULTIPLE  MYELOMA; HYPERLIPIDEMIA; ANEMIA; HYPERTENSION; Cor Athrscl-Uns Vessel; DYSRHYTHMIA, CARDIAC NOS; GASTROESOPHAGEAL REFLUX DISEASE; GASTROPARESIS; DYSPEPSIA; CONSTIPATION NOS; OVERACTIVE BLADDER; ALOPECIA; OSTEOARTHRITIS; ARTHRALGIA; LEG PAIN; VERTIGO; OTHER NONSPECIFIC FINDINGS EXAMINATION OF BLOOD; PROTEINURIA; HX, PERSONAL, MALIGNANCY, COLON; UTI (urinary tract infection); Hypertension; Vertigo; and Poor sleep pattern on her problem list.     is allergic to gabapentin and simvastatin.  [name] had no medications administered during this visit.  Past Surgical History  Procedure Date  . Colon surgery     resection, [facility]  . Abdominal hysterectomy 1970s    [facility]  . Knee arthroscopy     LEFT, [facility]  . Heel spur surgery     KEELING, [facility]  . Portacath placement     [facility]   . Cervical spine surgery 2004    [facility]  . Port-a-cath removal 11/05/2010    Procedure: REMOVAL PORT-A-CATH;  Surgeon: Dalia Heading;  Location: [facility];  Service: General;  Laterality: Left;  started at 0846  . Portacath placement 11/05/2010    Procedure: INSERTION PORT-A-CATH;  Surgeon: Dalia Heading;  Location: [facility];  Service: General;  Laterality: Right;  Right subclavian, device left accessed  Procedure done at 0844    Denies any headaches, dizziness, double vision, fevers, chills, night sweats, nausea, vomiting, diarrhea, constipation, chest pain, heart palpitations, shortness of breath, blood in stool, black tarry stool, urinary pain, urinary burning, urinary frequency, hematuria.   PHYSICAL  EXAMINATION  ECOG PERFORMANCE STATUS: 2 - Symptomatic, <50% confined to bed  Filed Vitals:   05/06/11 1159  BP: 169/92  Pulse: 65  Temp: 99.5 F (37.5 C)    GENERAL:alert, no distress, well nourished, well developed, comfortable, cooperative and smiling SKIN: skin color, texture, turgor are normal, no rashes or significant lesions HEAD: Normocephalic EYES: normal EARS: External ears normal OROPHARYNX:mucous membranes are moist  NECK: supple, trachea midline LYMPH:  not examined BREAST:not examined LUNGS: not examined HEART: not examined ABDOMEN:not examined BACK: Back symmetric, no curvature. EXTREMITIES:positive findings:  edema pre-tibial 4+ pitting B/L LE edema leaking clear liquids. It is tender to the touch.  Edema noted inferior to patella and it noted in the lower 2/3 of lower leg.  NEURO: no focal motor/sensory deficits   LABORATORY DATA: CBC    Component Value Date/Time   WBC 8.2 05/05/2011 1133   RBC 2.65* 05/05/2011 1133   HGB 8.4* 05/05/2011 1133   HCT 25.6* 05/05/2011 1133   PLT 125* 05/05/2011 1133   MCV 96.6 05/05/2011 1133   MCH 31.7 05/05/2011 1133   MCHC 32.8 05/05/2011 1133   RDW 15.5 05/05/2011 1133   LYMPHSABS 0.8 05/05/2011 1133   MONOABS 0.8 05/05/2011 1133   EOSABS 0.0 05/05/2011 1133   BASOSABS 0.0 05/05/2011 1133      Chemistry      Component Value Date/Time   NA 142 05/05/2011 1133   K 3.4* 05/05/2011 1133   CL 110 05/05/2011 1133   CO2 28 05/05/2011 1133   BUN 13 05/05/2011 1133   CREATININE 1.19* 05/05/2011 1133   CREATININE 104.89* 03/04/2010 0936      Component Value Date/Time   CALCIUM 9.2 05/05/2011 1133   ALKPHOS 30* 05/05/2011 1133   AST 9 05/05/2011 1133   ALT 10 05/05/2011 1133   BILITOT 0.5 05/05/2011 1133     Results for [name] (MRN [id number]) as of 05/06/2011 12:55  Ref. Range 05/05/2011 11:20  Pro B Natriuretic peptide (BNP) Latest Range: 0-125 pg/mL 942.2 (H)     RADIOGRAPHIC STUDIES:  Dg Chest 2 View  05/05/2011   *RADIOLOGY REPORT*  Clinical Data: Evaluate for congestive heart failure  CHEST - 2 VIEW  Comparison: 11/30/10  Findings: Cardiomediastinal silhouette is stable.  Stable right subclavian Port-A-Cath position. No acute infiltrate or pleural effusion.  No pulmonary edema.  Bony thorax is stable.  IMPRESSION: No active disease.  No significant change.  Original Report Authenticated By: Natasha Mead, M.D.      ASSESSMENT:  1. B/L LE 4+ pitting edema, sudden onset 2. Heart "fluttering" 3. SOB with exertion 4. 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 She  presented initially as an MGUS in 1997 but evolved into myeloma within the last several years. S/P 2 cycles of Carfilzomib. S/P Revlimid and dexamethasone but developed Revlimid-induced skin toxicity with contraction of her skin of the legs as well as significant peripheral neuropathies. That is down to a grade 1 neuropathy now. Next, she was treated  with Velcade and developed more neuropathic symptomatology to the point that she could not walk and was always coming up here in a wheelchair. Could hardly get around at home. She has been most recently treated with Cytoxan and dexamethasone. That was marginally effective at best.  5. Grade 2 Peripheral Neuropathy, will follow closely. Will not titrate carfilzomib dose and will remain at 20 mg/m2. Will consider reducing if worsening of peripheral neuropathy. Gabapentin is not an option for this patient due to severe side effects of medication including aphasia.  6. Left ICA stenosis 50-69%   PLAN:  1. Referral to cardiology for evaluation of CHF and "heart fluttering" 2. 2D Echo beginning of next week. 3. Chemotherapy deferred until next Thursday and Friday  2D echo needs to be reported before giving chemotherapy. If EF decreased will hold chemotherapy.  4. Transfuse 1 unit PRBCs on Monday 5. Type and Screen today 6. I printed out reports of US carotids for her upcoming appointment. 7. She will stop by  radiology for CD-ROM copy of [place]. 8. I personally reviewed and went over laboratory results with the patient. 9. I personally reviewed and went over radiographic studies with the patient. 10. Return in one month for follow-up.  11. She will take 40 mg of Lasix daily and Spironolactone M/W/F until LE edema resolves/improves.    All questions were answered. The patient knows to call the clinic with any problems, questions or concerns. We can certainly see the patient much sooner if necessary.  The patient and plan discussed with Glenford Peers, MD and he is in agreement with the aforementioned.  Patient seen and examined by Dr. Mariel Sleet  I spent 20 minutes counseling the patient face to face. The total time spent in the appointment was 30 minutes. More than 50% of the time spent with the patient was utilized for counseling and coordination of care.   Williom Cedar

## 2011-05-06 NOTE — Patient Instructions (Signed)
[facility]  Discharge Instructions  RECOMMENDATIONS MADE BY THE CONSULTANT AND ANY TEST RESULTS WILL BE SENT TO YOUR REFERRING DOCTOR.   EXAM FINDINGS BY MD TODAY AND SIGNS AND SYMPTOMS TO REPORT TO CLINIC OR PRIMARY MD:  We need to schedule you for a heart test and to see a heart doctor.  We also will give you one unit of blood on Monday. INSTRUCTIONS GIVEN AND DISCUSSED: We won't give chemo until after we get heart test back.  SPECIAL INSTRUCTIONS/FOLLOW-UP:    I acknowledge that I have been informed and understand all the instructions given to me and received a copy. I do not have any more questions at this time, but understand that I may call the Specialty Clinic at [facility] at [phone] during business hours should I have any further questions or need assistance in obtaining follow-up care.    __________________________________________  _____________  __________ Signature of Patient or Authorized Representative            Date                   Time    __________________________________________ Nurse's Signature

## 2011-05-09 ENCOUNTER — Encounter (HOSPITAL_BASED_OUTPATIENT_CLINIC_OR_DEPARTMENT_OTHER): Payer: Medicare HMO

## 2011-05-09 ENCOUNTER — Ambulatory Visit (HOSPITAL_COMMUNITY)
Admission: RE | Admit: 2011-05-09 | Discharge: 2011-05-09 | Disposition: A | Discharge status: Home / Self Care | Payer: Medicare HMO | Source: Ambulatory Visit | Attending: Oncology | Admitting: Oncology

## 2011-05-09 DIAGNOSIS — R0609 Other forms of dyspnea: Secondary | ICD-10-CM | POA: Insufficient documentation

## 2011-05-09 DIAGNOSIS — R0989 Other specified symptoms and signs involving the circulatory and respiratory systems: Secondary | ICD-10-CM | POA: Insufficient documentation

## 2011-05-09 DIAGNOSIS — E785 Hyperlipidemia, unspecified: Secondary | ICD-10-CM | POA: Insufficient documentation

## 2011-05-09 DIAGNOSIS — I10 Essential (primary) hypertension: Secondary | ICD-10-CM | POA: Insufficient documentation

## 2011-05-09 DIAGNOSIS — D649 Anemia, unspecified: Secondary | ICD-10-CM

## 2011-05-09 DIAGNOSIS — R609 Edema, unspecified: Secondary | ICD-10-CM

## 2011-05-09 DIAGNOSIS — C9000 Multiple myeloma not having achieved remission: Secondary | ICD-10-CM

## 2011-05-09 DIAGNOSIS — I4902 Ventricular flutter: Secondary | ICD-10-CM

## 2011-05-09 DIAGNOSIS — I517 Cardiomegaly: Secondary | ICD-10-CM

## 2011-05-09 MED ORDER — SODIUM CHLORIDE 0.9 % IJ SOLN
10.0000 mL | Freq: Once | INTRAMUSCULAR | Status: AC
  Administered 2011-05-09: 10 mL via INTRAVENOUS
  Filled 2011-05-09: qty 10

## 2011-05-09 MED ORDER — SODIUM CHLORIDE 0.9 % IJ SOLN
INTRAMUSCULAR | Status: AC
  Filled 2011-05-09: qty 10

## 2011-05-09 MED ORDER — HEPARIN SOD (PORK) LOCK FLUSH 100 UNIT/ML IV SOLN
500.0000 [IU] | Freq: Once | INTRAVENOUS | Status: AC
  Administered 2011-05-09: 500 [IU] via INTRAVENOUS
  Filled 2011-05-09: qty 5

## 2011-05-09 MED ORDER — HEPARIN SOD (PORK) LOCK FLUSH 100 UNIT/ML IV SOLN
INTRAVENOUS | Status: AC
  Administered 2011-05-09: 500 [IU] via INTRAVENOUS
  Filled 2011-05-09: qty 5

## 2011-05-09 MED ORDER — SODIUM CHLORIDE 0.9 % IV SOLN
INTRAVENOUS | Status: DC
  Administered 2011-05-09: 500 mL via INTRAVENOUS

## 2011-05-09 NOTE — Progress Notes (Signed)
*  PRELIMINARY RESULTS* Echocardiogram 2D Echocardiogram has been performed.  [name] 05/09/2011, 1:22 PM

## 2011-05-09 NOTE — Progress Notes (Signed)
Tolerated transfusion well. 

## 2011-05-11 ENCOUNTER — Encounter (HOSPITAL_BASED_OUTPATIENT_CLINIC_OR_DEPARTMENT_OTHER): Payer: Medicare HMO

## 2011-05-11 VITALS — BP 158/95 | HR 76 | Temp 99.1°F

## 2011-05-11 DIAGNOSIS — C9000 Multiple myeloma not having achieved remission: Secondary | ICD-10-CM

## 2011-05-11 DIAGNOSIS — Z5111 Encounter for antineoplastic chemotherapy: Secondary | ICD-10-CM

## 2011-05-11 LAB — COMPREHENSIVE METABOLIC PANEL
ALT: 10 U/L (ref 0–35)
AST: 14 U/L (ref 0–37)
Albumin: 3.3 g/dL — ABNORMAL LOW (ref 3.5–5.2)
Alkaline Phosphatase: 39 U/L (ref 39–117)
CO2: 29 mEq/L (ref 19–32)
Chloride: 108 mEq/L (ref 96–112)
Creatinine, Ser: 1.22 mg/dL — ABNORMAL HIGH (ref 0.50–1.10)
GFR calc non Af Amer: 44 mL/min — ABNORMAL LOW (ref >90)
Potassium: 3.7 mEq/L (ref 3.5–5.1)
Sodium: 143 mEq/L (ref 135–145)
Total Bilirubin: 0.4 mg/dL (ref 0.3–1.2)

## 2011-05-11 LAB — DIFFERENTIAL
Basophils Absolute: 0 10*3/uL (ref 0.0–0.1)
Basophils Relative: 0 % (ref 0–1)
Lymphocytes Relative: 13 % (ref 12–46)
Monocytes Absolute: 0.4 10*3/uL (ref 0.1–1.0)
Neutro Abs: 5.3 10*3/uL (ref 1.7–7.7)
Neutrophils Relative %: 79 % — ABNORMAL HIGH (ref 43–77)

## 2011-05-11 LAB — CBC
HCT: 30.4 % — ABNORMAL LOW (ref 36.0–46.0)
MCHC: 32.6 g/dL (ref 30.0–36.0)
RDW: 15.3 % (ref 11.5–15.5)
WBC: 6.7 10*3/uL (ref 4.0–10.5)

## 2011-05-11 MED ORDER — DEXTROSE 5 % IV SOLN
20.0000 mg/m2 | Freq: Once | INTRAVENOUS | Status: AC
  Administered 2011-05-11: 40 mg via INTRAVENOUS
  Filled 2011-05-11: qty 20

## 2011-05-11 MED ORDER — SODIUM CHLORIDE 0.9 % IV SOLN
Freq: Once | INTRAVENOUS | Status: AC
  Administered 2011-05-11: 8 mg via INTRAVENOUS
  Filled 2011-05-11: qty 4

## 2011-05-11 MED ORDER — SODIUM CHLORIDE 0.9 % IV SOLN: 8.0000 mg | Freq: Once | INTRAVENOUS | Status: DC

## 2011-05-11 MED ORDER — SODIUM CHLORIDE 0.9 % IJ SOLN
10.0000 mL | INTRAMUSCULAR | Status: DC | PRN
  Administered 2011-05-11: 10 mL
  Filled 2011-05-11: qty 10

## 2011-05-11 MED ORDER — DEXAMETHASONE SODIUM PHOSPHATE 10 MG/ML IJ SOLN: 10.0000 mg | Freq: Once | INTRAMUSCULAR | Status: DC

## 2011-05-11 MED ORDER — HEPARIN SOD (PORK) LOCK FLUSH 100 UNIT/ML IV SOLN
INTRAVENOUS | Status: AC
  Filled 2011-05-11: qty 5

## 2011-05-11 MED ORDER — SODIUM CHLORIDE 0.9 % IV SOLN
Freq: Once | INTRAVENOUS | Status: AC
  Administered 2011-05-11: 2000 mL via INTRAVENOUS

## 2011-05-11 MED ORDER — SODIUM CHLORIDE 0.9 % IV SOLN: Freq: Once | INTRAVENOUS | Status: DC

## 2011-05-11 NOTE — Progress Notes (Signed)
Tolerated chemo and fluids well. 

## 2011-05-12 ENCOUNTER — Encounter (HOSPITAL_BASED_OUTPATIENT_CLINIC_OR_DEPARTMENT_OTHER): Payer: Medicare HMO

## 2011-05-12 VITALS — BP 101/79 | HR 192 | Temp 98.2°F | Wt 206.2 lb

## 2011-05-12 DIAGNOSIS — Z5111 Encounter for antineoplastic chemotherapy: Secondary | ICD-10-CM

## 2011-05-12 DIAGNOSIS — R609 Edema, unspecified: Secondary | ICD-10-CM

## 2011-05-12 DIAGNOSIS — R600 Localized edema: Secondary | ICD-10-CM

## 2011-05-12 DIAGNOSIS — C9000 Multiple myeloma not having achieved remission: Secondary | ICD-10-CM

## 2011-05-12 MED ORDER — FUROSEMIDE 20 MG PO TABS: 20.0000 mg | ORAL_TABLET | Freq: Every day | ORAL | Status: DC

## 2011-05-12 MED ORDER — SODIUM CHLORIDE 0.9 % IV SOLN
Freq: Once | INTRAVENOUS | Status: AC
  Administered 2011-05-12: 8 mg via INTRAVENOUS
  Filled 2011-05-12: qty 4

## 2011-05-12 MED ORDER — DEXTROSE 5 % IV SOLN
20.0000 mg/m2 | Freq: Once | INTRAVENOUS | Status: AC
  Administered 2011-05-12: 40 mg via INTRAVENOUS
  Filled 2011-05-12: qty 20

## 2011-05-12 MED ORDER — SODIUM CHLORIDE 0.9 % IV SOLN
Freq: Once | INTRAVENOUS | Status: AC
  Administered 2011-05-12: 10:00:00 via INTRAVENOUS

## 2011-05-12 MED ORDER — SODIUM CHLORIDE 0.9 % IJ SOLN
10.0000 mL | INTRAMUSCULAR | Status: DC | PRN
  Filled 2011-05-12: qty 10

## 2011-05-12 MED ORDER — HEPARIN SOD (PORK) LOCK FLUSH 100 UNIT/ML IV SOLN
INTRAVENOUS | Status: AC
  Filled 2011-05-12: qty 5

## 2011-05-12 MED ORDER — HEPARIN SOD (PORK) LOCK FLUSH 100 UNIT/ML IV SOLN
500.0000 [IU] | Freq: Once | INTRAVENOUS | Status: AC | PRN
  Administered 2011-05-12: 500 [IU]
  Filled 2011-05-12: qty 5

## 2011-05-12 MED ORDER — SODIUM CHLORIDE 0.9 % IV SOLN: 8.0000 mg | Freq: Once | INTRAVENOUS | Status: DC

## 2011-05-12 MED ORDER — SODIUM CHLORIDE 0.9 % IJ SOLN
INTRAMUSCULAR | Status: AC
  Filled 2011-05-12: qty 10

## 2011-05-12 MED ORDER — DEXAMETHASONE SODIUM PHOSPHATE 10 MG/ML IJ SOLN: 10.0000 mg | Freq: Once | INTRAMUSCULAR | Status: DC

## 2011-05-12 NOTE — Progress Notes (Signed)
Infusion complete, patient tolerated well.  Escorted out by her son in a wheelchair.

## 2011-05-12 NOTE — Progress Notes (Signed)
Subjective: Patient asked me to see her today.   Nurse took pulse and BP with machine and got very different numbers when comparing the left side to the right.  I was therefore asked to see her.   The patient denies any complaints.  She reports that her LE have swollen again.  This is visualized and I concur with this complaint.  Objective: BP 101/79  Pulse 192  Temp(Src) 98.2 F (36.8 C) (Oral)  Wt 206 lb 3.2 oz (93.532 kg)  When I took her BP manually I got 160/90 on left side and 150/90 on the right.  Pulse was stable at approximately 70.  General: Awake in bed.  NAD. HEENT: Atraumatic Neck: Trachea midline Extremities: 2-3+ B/L pitting LE edema Skin: Warm and dry Neuro: A and O x 3.  No focal deficits.  Assessment: 1. B/L LE edema with normal EF on echo. 2. Equal BP on both sides  Plan:  1. E-scribed Lasix 20 mg daily 2. Continue spironolactone as ordered 3. Continue with chemotherapy as scheduled today 4. Will print out a medication list. 5. Return as scheduled 6. Follow-up with cardiologist as scheduled.  7. Encouraged her to keep LE elevated.   All questions were answered.  Patient knows to call the clinic with any questions or concerns.   KEFALAS,THOMAS

## 2011-05-13 ENCOUNTER — Inpatient Hospital Stay (HOSPITAL_COMMUNITY): Payer: Medicare HMO

## 2011-05-13 ENCOUNTER — Telehealth (HOSPITAL_COMMUNITY): Payer: Self-pay

## 2011-05-16 ENCOUNTER — Ambulatory Visit: Payer: Medicare HMO | Admitting: Cardiology

## 2011-05-24 ENCOUNTER — Ambulatory Visit (INDEPENDENT_AMBULATORY_CARE_PROVIDER_SITE_OTHER): Payer: Medicare HMO | Admitting: Cardiology

## 2011-05-24 ENCOUNTER — Encounter: Payer: Self-pay | Admitting: Cardiology

## 2011-05-24 VITALS — BP 161/91 | HR 74 | Ht 65.0 in | Wt 206.0 lb

## 2011-05-24 DIAGNOSIS — R609 Edema, unspecified: Secondary | ICD-10-CM

## 2011-05-24 DIAGNOSIS — R002 Palpitations: Secondary | ICD-10-CM | POA: Insufficient documentation

## 2011-05-24 DIAGNOSIS — I2510 Atherosclerotic heart disease of native coronary artery without angina pectoris: Secondary | ICD-10-CM

## 2011-05-24 DIAGNOSIS — R600 Localized edema: Secondary | ICD-10-CM

## 2011-05-24 DIAGNOSIS — I10 Essential (primary) hypertension: Secondary | ICD-10-CM

## 2011-05-24 NOTE — Patient Instructions (Signed)
Your physician recommends that you schedule a follow-up appointment in: 1 month  Your physician has recommended that you wear an event monitor. Event monitors are medical devices that record the heart's electrical activity. Doctors most often [place] these monitors to diagnose arrhythmias. Arrhythmias are problems with the speed or rhythm of the heartbeat. The monitor is a small, portable device. You can wear one while you do your normal daily activities. This is usually used to diagnose what is causing palpitations/syncope (passing out).  Compression Stockings during the day

## 2011-05-24 NOTE — Assessment & Plan Note (Signed)
Followed by Dr. Don Diego 

## 2011-05-24 NOTE — Assessment & Plan Note (Signed)
Prior evaluation by [facility] - PTCA apical LAD. No active angina.

## 2011-05-24 NOTE — Assessment & Plan Note (Signed)
Not certain how much prior reaction to Revlimid affected skin of legs and possibly autonomic tone - could be related to edema. Actually has appearance of lymphedema. She is on diuretics - would be careful not to push dose too high. Compression stockings were also provided. This edema is not related to heart failure based on recent echocardiogram.

## 2011-05-24 NOTE — Assessment & Plan Note (Signed)
Vague in description, relatively frequent. Will assess with cardiac monitor.

## 2011-05-24 NOTE — Progress Notes (Signed)
Clinical Summary [name] is a 71 y.o.female referred for cardiology consultation by Dr. Mariel Ray. She has multiple myeloma undergoing active chemotherapy. Has had problems with recent lower extremity edema and palpitations, some shortness of breath. She states that the edema has been noted in the past when she had problems with Revlimid (skin contraction and neuropathy. Does state swelling has been worse recently.  Recent echocardiogram reviewed showing decreased chamber size with moderate LVH, hyperdynamic LVEF of 75% without wall motion abnormality, mild left atrial enlargement, no major valvular abnormalities.  Has prior cardiac history, treated by [name]. Did not locate any history of arrhythmias. She denies angina. Has NYHA class II-III dyspnea.  Description of palpitations somewhat vague, no syncope, no obvious trigger.   Allergies  Allergen Reactions  . Gabapentin Other (See Comments)    Difficulty with speech  . Simvastatin     REACTION: leg cramps    Current Outpatient Prescriptions  Medication Sig Dispense Refill  . acetaminophen (TYLENOL) 325 MG tablet Take 650 mg by mouth every 4 (four) hours as needed.        Marland Kitchen acyclovir (ZOVIRAX) 400 MG tablet Take 400 mg by mouth daily.       Marland Kitchen aspirin 81 MG EC tablet Take 81 mg by mouth every morning.        Marland Kitchen dexamethasone (DECADRON) 4 MG tablet Take 2 tablets by mouth daily starting the day after chemotherapy for 2 days. Take with food.  30 tablet  1  . dexlansoprazole (DEXILANT) 60 MG capsule Take 60 mg by mouth daily.       . furosemide (LASIX) 20 MG tablet Take 1 tablet (20 mg total) by mouth daily.  30 tablet  0  . LORazepam (ATIVAN) 0.5 MG tablet Take 1 tablet (0.5 mg total) by mouth every 6 (six) hours as needed (Nausea or vomiting).  30 tablet  2  . nebivolol (BYSTOLIC) 5 MG tablet Take 5 mg by mouth daily.        Marland Kitchen omeprazole (PRILOSEC) 20 MG capsule Take 20 mg by mouth 2 (two) times daily.       . ondansetron (ZOFRAN) 8  MG tablet Take 1 tablet two times a day starting the day after chemo for 2 days. Then take 1 tablet two times a day as needed for nausea or vomiting.  30 tablet  2  . oxybutynin (DITROPAN) 5 MG tablet Take 5 mg by mouth daily.       . potassium chloride SA (K-DUR,KLOR-CON) 20 MEQ tablet Take 20 mEq by mouth 2 (two) times daily.       . prochlorperazine (COMPAZINE) 25 MG suppository Place 1 suppository (25 mg total) rectally every 6 (six) hours as needed for nausea.  12 suppository  2  . rosuvastatin (CRESTOR) 20 MG tablet Take 20 mg by mouth at bedtime.        Marland Kitchen spironolactone (ALDACTONE) 50 MG tablet Take 50 mg by mouth 3 (three) times a week.       . sucralfate (CARAFATE) 1 GM/10ML suspension Take 10 mLs (1 g total) by mouth 4 (four) times daily.  420 mL  0  . valsartan (DIOVAN) 160 MG tablet Take 160 mg by mouth daily.          Past Medical History  Diagnosis Date  . Essential hypertension, benign   . Coronary atherosclerosis of native coronary artery     Former patient of [name] - PTCA apical LAD 2005  . Type 2 diabetes  mellitus     Borderline  . GERD (gastroesophageal reflux disease)   . Colon cancer   . Hypercholesteremia   . Multiple myeloma   . Female stress incontinence   . UTI (urinary tract infection)   . Vertigo   . Poor sleep pattern     Past Surgical History  Procedure Date  . Colon surgery     Resection, [name]  . Abdominal hysterectomy 1970s    [name]  . Knee arthroscopy     LEFT, [name]  . Heel spur surgery     [name], [name]  . Portacath placement     [name]   . Cervical spine surgery 2004    [name]  . Port-a-cath removal 11/05/2010    Procedure: REMOVAL PORT-A-CATH;  Surgeon: [name];  Location: [facility];  Service: General;  Laterality: Left;  started at 0846  . Portacath placement 11/05/2010    Procedure: INSERTION PORT-A-CATH;  Surgeon: [name];  Location: [facility];  Service: General;  Laterality: Right;  Right subclavian, device left accessed     Family  History  Problem Relation Age of Onset  . Anesthesia problems Neg Hx   . Hypotension Neg Hx   . Malignant hyperthermia Neg Hx   . Pseudochol deficiency Neg Hx   . Lung disease Mother   . Heart disease Father   . Heart disease Brother   . Hypertension Brother   . Arthritis Daughter   . Seizures Daughter   . Multiple sclerosis Son     Social History [name] reports that she quit smoking about 20 years ago. Her smoking use included Cigarettes. She smoked .25 packs per day. She has never used smokeless tobacco. [name] reports that she does not drink alcohol.  Review of Systems Using wheelchair. No orthopnea or PND. No fevers or chills. Stable appetite. Otherwise negative except as outlined.  Physical Examination Filed Vitals:   05/24/11 1350  BP: 161/91  Pulse: 74   Obese woman in NAD. HEENT: Conjunctiva and lids normal, oropharynx clear. Neck: Supple, no elevated JVP or carotid bruits, no thyromegaly. Lungs: Clear to auscultation, diminished BS, nonlabored breathing at rest. Cardiac: Regular rate and rhythm, no S3, soft systolic murmur, no pericardial rub. Abdomen: Soft, nontender, bowel sounds present, no guarding or rebound. Extremities: Firm edema mid leg down to toes - question lymphedema, distal pulses 1+. Skin: Warm and dry. Musculoskeletal: No kyphosis. Neuropsychiatric: Alert and oriented x3, affect grossly appropriate.   ECG Sinus rhythm with LVH, leftward axis, repolarization changes.   Problem List and Plan

## 2011-05-25 ENCOUNTER — Encounter (HOSPITAL_BASED_OUTPATIENT_CLINIC_OR_DEPARTMENT_OTHER): Payer: Medicare HMO

## 2011-05-25 DIAGNOSIS — C9000 Multiple myeloma not having achieved remission: Secondary | ICD-10-CM

## 2011-05-25 DIAGNOSIS — Z5111 Encounter for antineoplastic chemotherapy: Secondary | ICD-10-CM

## 2011-05-25 LAB — DIFFERENTIAL
Basophils Absolute: 0 10*3/uL (ref 0.0–0.1)
Basophils Relative: 0 % (ref 0–1)
Eosinophils Absolute: 0.1 10*3/uL (ref 0.0–0.7)
Neutrophils Relative %: 73 % (ref 43–77)

## 2011-05-25 LAB — COMPREHENSIVE METABOLIC PANEL
Alkaline Phosphatase: 38 U/L — ABNORMAL LOW (ref 39–117)
BUN: 10 mg/dL (ref 6–23)
CO2: 27 mEq/L (ref 19–32)
Chloride: 106 mEq/L (ref 96–112)
GFR calc Af Amer: 53 mL/min — ABNORMAL LOW (ref >90)
GFR calc non Af Amer: 46 mL/min — ABNORMAL LOW (ref >90)
Glucose, Bld: 98 mg/dL (ref 70–99)
Potassium: 3.6 mEq/L (ref 3.5–5.1)
Total Bilirubin: 0.4 mg/dL (ref 0.3–1.2)
Total Protein: 6.4 g/dL (ref 6.0–8.3)

## 2011-05-25 LAB — CBC
Platelets: 179 10*3/uL (ref 150–400)
RBC: 3.06 MIL/uL — ABNORMAL LOW (ref 3.87–5.11)
RDW: 14.7 % (ref 11.5–15.5)
WBC: 6.4 10*3/uL (ref 4.0–10.5)

## 2011-05-25 MED ORDER — SODIUM CHLORIDE 0.9 % IV SOLN: Freq: Once | INTRAVENOUS | Status: DC

## 2011-05-25 MED ORDER — SODIUM CHLORIDE 0.9 % IV SOLN
Freq: Once | INTRAVENOUS | Status: AC
  Administered 2011-05-25: 12:00:00 via INTRAVENOUS

## 2011-05-25 MED ORDER — SODIUM CHLORIDE 0.9 % IJ SOLN
INTRAMUSCULAR | Status: AC
  Administered 2011-05-25: 10 mL
  Filled 2011-05-25: qty 10

## 2011-05-25 MED ORDER — SODIUM CHLORIDE 0.9 % IJ SOLN
10.0000 mL | INTRAMUSCULAR | Status: DC | PRN
  Administered 2011-05-25: 10 mL
  Filled 2011-05-25: qty 10

## 2011-05-25 MED ORDER — SODIUM CHLORIDE 0.9 % IV SOLN: 8.0000 mg | Freq: Once | INTRAVENOUS | Status: DC

## 2011-05-25 MED ORDER — DEXAMETHASONE SODIUM PHOSPHATE 10 MG/ML IJ SOLN: 10.0000 mg | Freq: Once | INTRAMUSCULAR | Status: DC

## 2011-05-25 MED ORDER — HEPARIN SOD (PORK) LOCK FLUSH 100 UNIT/ML IV SOLN
INTRAVENOUS | Status: AC
  Filled 2011-05-25: qty 5

## 2011-05-25 MED ORDER — HEPARIN SOD (PORK) LOCK FLUSH 100 UNIT/ML IV SOLN
500.0000 [IU] | Freq: Once | INTRAVENOUS | Status: AC | PRN
  Administered 2011-05-25: 500 [IU]
  Filled 2011-05-25: qty 5

## 2011-05-25 MED ORDER — SODIUM CHLORIDE 0.9 % IV SOLN
Freq: Once | INTRAVENOUS | Status: AC
  Administered 2011-05-25: 8 mg via INTRAVENOUS
  Filled 2011-05-25: qty 4

## 2011-05-25 MED ORDER — DEXTROSE 5 % IV SOLN
20.0000 mg/m2 | Freq: Once | INTRAVENOUS | Status: AC
  Administered 2011-05-25: 40 mg via INTRAVENOUS
  Filled 2011-05-25: qty 20

## 2011-05-26 ENCOUNTER — Encounter (HOSPITAL_BASED_OUTPATIENT_CLINIC_OR_DEPARTMENT_OTHER): Payer: Medicare HMO

## 2011-05-26 VITALS — BP 185/104 | HR 88 | Temp 98.8°F | Ht 65.0 in | Wt 208.7 lb

## 2011-05-26 DIAGNOSIS — R51 Headache: Secondary | ICD-10-CM

## 2011-05-26 DIAGNOSIS — C9000 Multiple myeloma not having achieved remission: Secondary | ICD-10-CM

## 2011-05-26 DIAGNOSIS — Z5112 Encounter for antineoplastic immunotherapy: Secondary | ICD-10-CM

## 2011-05-26 MED ORDER — ACETAMINOPHEN 500 MG PO TABS
500.0000 mg | ORAL_TABLET | Freq: Four times a day (QID) | ORAL | Status: DC | PRN
  Administered 2011-05-26: 500 mg via ORAL

## 2011-05-26 MED ORDER — HEPARIN SOD (PORK) LOCK FLUSH 100 UNIT/ML IV SOLN
INTRAVENOUS | Status: AC
  Administered 2011-05-26: 500 [IU]
  Filled 2011-05-26: qty 5

## 2011-05-26 MED ORDER — ACETAMINOPHEN 500 MG PO TABS
ORAL_TABLET | ORAL | Status: AC
  Filled 2011-05-26: qty 2

## 2011-05-26 MED ORDER — DEXTROSE 5 % IV SOLN
20.0000 mg/m2 | Freq: Once | INTRAVENOUS | Status: AC
  Administered 2011-05-26: 40 mg via INTRAVENOUS
  Filled 2011-05-26: qty 20

## 2011-05-26 MED ORDER — SODIUM CHLORIDE 0.9 % IJ SOLN
INTRAMUSCULAR | Status: AC
  Filled 2011-05-26: qty 10

## 2011-05-26 MED ORDER — SODIUM CHLORIDE 0.9 % IV SOLN
Freq: Once | INTRAVENOUS | Status: AC
  Administered 2011-05-26: 500 mL via INTRAVENOUS

## 2011-05-26 MED ORDER — HEPARIN SOD (PORK) LOCK FLUSH 100 UNIT/ML IV SOLN
500.0000 [IU] | Freq: Once | INTRAVENOUS | Status: AC | PRN
  Administered 2011-05-26: 500 [IU]
  Filled 2011-05-26: qty 5

## 2011-05-26 MED ORDER — ONDANSETRON HCL 4 MG/2ML IJ SOLN
Freq: Once | INTRAMUSCULAR | Status: AC
  Administered 2011-05-26: 8 mg via INTRAVENOUS
  Filled 2011-05-26: qty 4

## 2011-05-26 MED ORDER — SODIUM CHLORIDE 0.9 % IJ SOLN
10.0000 mL | INTRAMUSCULAR | Status: DC | PRN
  Administered 2011-05-26: 10 mL
  Filled 2011-05-26: qty 10

## 2011-05-26 MED ORDER — SODIUM CHLORIDE 0.9 % IV SOLN: 8.0000 mg | Freq: Once | INTRAVENOUS | Status: DC

## 2011-05-26 MED ORDER — DEXAMETHASONE SODIUM PHOSPHATE 10 MG/ML IJ SOLN: 10.0000 mg | Freq: Once | INTRAMUSCULAR | Status: DC

## 2011-05-26 NOTE — Progress Notes (Signed)
[name] was seen while she was getting chemotherapy.  She reports a consistent headache and concerns about her blood pressure. The patient consistently runs hypertensive according to our records and I question whether her headache is from the hypertension.  We spent some time today going over her blood pressure medications.  She takes Diovan 160 mg daily and Bystolic 5 mg daily.  I have taken this opportunity to increase her Bystolic to 5 mg, take two tablets in AM for a total of 10 mg daily.    Vitals - 1 value per visit 05/26/2011   05/24/2011 05/12/2011   SYSTOLIC 185   161 101   DIASTOLIC 104   91 79    Vitals - 1 value per visit 05/11/2011  05/09/2011 05/06/2011  05/05/2011  SYSTOLIC 158  115 169  182  DIASTOLIC 95  73 92  85   She is anticipated to see her PCP tomorrow and I have asked her to discuss her hypertension with him, Dr. Janna Arch.  [name]

## 2011-05-27 ENCOUNTER — Ambulatory Visit: Payer: Medicare HMO | Admitting: *Deleted

## 2011-05-31 ENCOUNTER — Telehealth: Payer: Self-pay | Admitting: Cardiology

## 2011-05-31 NOTE — Telephone Encounter (Signed)
**Note De-Identified [name] Obfuscation** Pt. came into office on Friday (05-27-11) to have event monitor attached. Once event monitor was attached we were advised by [employer] that pt. had received the wrong monitor and that they would send the correct one to pt's address. They also stated that they would have a representative come to pt's home to attach monitor to pt. Pt. received monitor on Saturday but has not heard from [employer] concerning when the rep. will be coming to her home. I spoke with Kriste Basque at [facility] who stated she will contact  rep. again today to set up time to go to pt's home. Pt. aware and advised to call us back tomorrow if she has not been contacted by [employer], she verbalized understanding.

## 2011-05-31 NOTE — Telephone Encounter (Signed)
Patient would like return phone call.  Has questions about monitor. / tg

## 2011-06-01 ENCOUNTER — Encounter (HOSPITAL_COMMUNITY): Payer: Medicare HMO | Attending: Oncology

## 2011-06-01 VITALS — BP 175/76 | HR 76 | Temp 98.1°F | Ht 65.0 in | Wt 208.4 lb

## 2011-06-01 DIAGNOSIS — C9000 Multiple myeloma not having achieved remission: Secondary | ICD-10-CM

## 2011-06-01 DIAGNOSIS — Z5112 Encounter for antineoplastic immunotherapy: Secondary | ICD-10-CM

## 2011-06-01 DIAGNOSIS — N189 Chronic kidney disease, unspecified: Secondary | ICD-10-CM | POA: Insufficient documentation

## 2011-06-01 DIAGNOSIS — I10 Essential (primary) hypertension: Secondary | ICD-10-CM | POA: Insufficient documentation

## 2011-06-01 LAB — DIFFERENTIAL
Basophils Absolute: 0 10*3/uL (ref 0.0–0.1)
Eosinophils Absolute: 0.1 10*3/uL (ref 0.0–0.7)
Eosinophils Relative: 1 % (ref 0–5)
Lymphocytes Relative: 12 % (ref 12–46)
Monocytes Absolute: 0.7 10*3/uL (ref 0.1–1.0)

## 2011-06-01 LAB — COMPREHENSIVE METABOLIC PANEL
ALT: 8 U/L (ref 0–35)
AST: 7 U/L (ref 0–37)
Alkaline Phosphatase: 38 U/L — ABNORMAL LOW (ref 39–117)
CO2: 27 mEq/L (ref 19–32)
Calcium: 10 mg/dL (ref 8.4–10.5)
Chloride: 107 mEq/L (ref 96–112)
GFR calc non Af Amer: 43 mL/min — ABNORMAL LOW (ref >90)
Potassium: 3.3 mEq/L — ABNORMAL LOW (ref 3.5–5.1)
Sodium: 140 mEq/L (ref 135–145)
Total Bilirubin: 0.4 mg/dL (ref 0.3–1.2)

## 2011-06-01 LAB — CBC
HCT: 30.5 % — ABNORMAL LOW (ref 36.0–46.0)
Hemoglobin: 9.9 g/dL — ABNORMAL LOW (ref 12.0–15.0)
MCV: 95 fL (ref 78.0–100.0)
RBC: 3.21 MIL/uL — ABNORMAL LOW (ref 3.87–5.11)
RDW: 14.8 % (ref 11.5–15.5)
WBC: 6.7 10*3/uL (ref 4.0–10.5)

## 2011-06-01 MED ORDER — SODIUM CHLORIDE 0.9 % IV SOLN: Freq: Once | INTRAVENOUS | Status: DC

## 2011-06-01 MED ORDER — SODIUM CHLORIDE 0.9 % IV SOLN
Freq: Once | INTRAVENOUS | Status: AC
Start: 2011-06-01 — End: 2011-06-01
  Administered 2011-06-01: 11:00:00 via INTRAVENOUS

## 2011-06-01 MED ORDER — SODIUM CHLORIDE 0.9 % IV SOLN
Freq: Once | INTRAVENOUS | Status: AC
  Administered 2011-06-01: 8 mg via INTRAVENOUS
  Filled 2011-06-01: qty 4

## 2011-06-01 MED ORDER — HEPARIN SOD (PORK) LOCK FLUSH 100 UNIT/ML IV SOLN
INTRAVENOUS | Status: AC
  Administered 2011-06-01: 500 [IU]
  Filled 2011-06-01: qty 5

## 2011-06-01 MED ORDER — DEXTROSE 5 % IV SOLN
20.0000 mg/m2 | Freq: Once | INTRAVENOUS | Status: AC
  Administered 2011-06-01: 40 mg via INTRAVENOUS
  Filled 2011-06-01: qty 20

## 2011-06-01 MED ORDER — DEXAMETHASONE SODIUM PHOSPHATE 10 MG/ML IJ SOLN: 10.0000 mg | Freq: Once | INTRAMUSCULAR | Status: DC

## 2011-06-01 MED ORDER — HEPARIN SOD (PORK) LOCK FLUSH 100 UNIT/ML IV SOLN
500.0000 [IU] | Freq: Once | INTRAVENOUS | Status: AC | PRN
Start: 2011-06-01 — End: 2011-06-01
  Administered 2011-06-01: 500 [IU]
  Filled 2011-06-01: qty 5

## 2011-06-01 MED ORDER — SODIUM CHLORIDE 0.9 % IJ SOLN
INTRAMUSCULAR | Status: AC
  Filled 2011-06-01: qty 10

## 2011-06-01 MED ORDER — SODIUM CHLORIDE 0.9 % IV SOLN: 8.0000 mg | Freq: Once | INTRAVENOUS | Status: DC

## 2011-06-01 MED ORDER — SODIUM CHLORIDE 0.9 % IJ SOLN
10.0000 mL | INTRAMUSCULAR | Status: DC | PRN
  Administered 2011-06-01: 10 mL
  Filled 2011-06-01: qty 10

## 2011-06-01 NOTE — Progress Notes (Signed)
Tolerated chemo well.  D/C to home accompanied by son .

## 2011-06-02 ENCOUNTER — Telehealth: Payer: Self-pay | Admitting: Cardiology

## 2011-06-02 ENCOUNTER — Encounter (HOSPITAL_BASED_OUTPATIENT_CLINIC_OR_DEPARTMENT_OTHER): Payer: Medicare HMO

## 2011-06-02 ENCOUNTER — Encounter (HOSPITAL_BASED_OUTPATIENT_CLINIC_OR_DEPARTMENT_OTHER): Payer: Medicare HMO | Admitting: Oncology

## 2011-06-02 VITALS — BP 226/97 | HR 64 | Temp 97.9°F | Wt 207.8 lb

## 2011-06-02 DIAGNOSIS — N189 Chronic kidney disease, unspecified: Secondary | ICD-10-CM

## 2011-06-02 DIAGNOSIS — I10 Essential (primary) hypertension: Secondary | ICD-10-CM

## 2011-06-02 DIAGNOSIS — R600 Localized edema: Secondary | ICD-10-CM

## 2011-06-02 DIAGNOSIS — Z5112 Encounter for antineoplastic immunotherapy: Secondary | ICD-10-CM

## 2011-06-02 DIAGNOSIS — C9000 Multiple myeloma not having achieved remission: Secondary | ICD-10-CM

## 2011-06-02 DIAGNOSIS — R609 Edema, unspecified: Secondary | ICD-10-CM

## 2011-06-02 DIAGNOSIS — G609 Hereditary and idiopathic neuropathy, unspecified: Secondary | ICD-10-CM

## 2011-06-02 LAB — RENAL FUNCTION PANEL
Albumin: 3.5 g/dL (ref 3.5–5.2)
Calcium: 10 mg/dL (ref 8.4–10.5)
Chloride: 104 mEq/L (ref 96–112)
Creatinine, Ser: 1.34 mg/dL — ABNORMAL HIGH (ref 0.50–1.10)
GFR calc non Af Amer: 39 mL/min — ABNORMAL LOW (ref >90)
Sodium: 139 mEq/L (ref 135–145)

## 2011-06-02 LAB — HEMOGLOBIN AND HEMATOCRIT, BLOOD: HCT: 29.6 % — ABNORMAL LOW (ref 36.0–46.0)

## 2011-06-02 LAB — IRON AND TIBC: UIBC: 89 ug/dL — ABNORMAL LOW (ref 125–400)

## 2011-06-02 MED ORDER — DEXTROSE 5 % IV SOLN
20.0000 mg/m2 | Freq: Once | INTRAVENOUS | Status: AC
  Administered 2011-06-02: 40 mg via INTRAVENOUS
  Filled 2011-06-02: qty 20

## 2011-06-02 MED ORDER — NEBIVOLOL HCL 5 MG PO TABS: 10.0000 mg | ORAL_TABLET | Freq: Every day | ORAL | Status: DC

## 2011-06-02 MED ORDER — SODIUM CHLORIDE 0.9 % IV SOLN: 8.0000 mg | Freq: Once | INTRAVENOUS | Status: DC

## 2011-06-02 MED ORDER — HEPARIN SOD (PORK) LOCK FLUSH 100 UNIT/ML IV SOLN
INTRAVENOUS | Status: AC
  Filled 2011-06-02: qty 5

## 2011-06-02 MED ORDER — SODIUM CHLORIDE 0.9 % IJ SOLN
INTRAMUSCULAR | Status: AC
  Filled 2011-06-02: qty 10

## 2011-06-02 MED ORDER — DEXAMETHASONE SODIUM PHOSPHATE 10 MG/ML IJ SOLN: 10.0000 mg | Freq: Once | INTRAMUSCULAR | Status: DC

## 2011-06-02 MED ORDER — SODIUM CHLORIDE 0.9 % IV SOLN
Freq: Once | INTRAVENOUS | Status: AC
  Administered 2011-06-02: 8 mg via INTRAVENOUS
  Filled 2011-06-02: qty 4

## 2011-06-02 MED ORDER — HEPARIN SOD (PORK) LOCK FLUSH 100 UNIT/ML IV SOLN
500.0000 [IU] | Freq: Once | INTRAVENOUS | Status: DC | PRN
Start: 2011-06-02 — End: 2011-06-02
  Filled 2011-06-02: qty 5

## 2011-06-02 MED ORDER — SODIUM CHLORIDE 0.9 % IV SOLN
Freq: Once | INTRAVENOUS | Status: AC
  Administered 2011-06-02: 10:00:00 via INTRAVENOUS

## 2011-06-02 MED ORDER — SODIUM CHLORIDE 0.9 % IJ SOLN
10.0000 mL | INTRAMUSCULAR | Status: DC | PRN
  Administered 2011-06-02: 10 mL
  Filled 2011-06-02: qty 10

## 2011-06-02 NOTE — Patient Instructions (Signed)
[facility]  Discharge Instructions  RECOMMENDATIONS MADE BY THE CONSULTANT AND ANY TEST RESULTS WILL BE SENT TO YOUR REFERRING DOCTOR.   Go by Cardiology office today and let them know about your blood pressure. See what they want to do. Blood pressure 05/26/11   185/104                            06/01/11    175/76                            06/02/11    226/97 and 215/95  Increase Spironolactone, take one EVERYDAY.  Return to clinic as scheduled for labs/chemotherapy.   I acknowledge that I have been informed and understand all the instructions given to me and received a copy. I do not have any more questions at this time, but understand that I may call the Specialty Clinic at [facility] at [phone] during business hours should I have any further questions or need assistance in obtaining follow-up care.    __________________________________________  _____________  __________ Signature of Patient or Authorized Representative            Date                   Time    __________________________________________ Nurse's Signature

## 2011-06-02 NOTE — Progress Notes (Signed)
Diane Stalling, MD, MD [address] [place] [address]  1. MULTIPLE  MYELOMA   2. Edema leg     CURRENT THERAPY:S/P cycle 4 day 9 cycles of Carfilzomib. S/P Revlimid and dexamethasone but developed Revlimid-induced skin toxicity with contraction of her skin of the legs as well as significant peripheral neuropathies. That is down to a grade 1 neuropathy now. Next, she was treated with Velcade and developed more neuropathic symptomatology to the point that she could not walk and was always coming up here in a wheelchair. Could hardly get around at home. She has been most recently treated with Cytoxan and dexamethasone. That was marginally effective at best.    INTERVAL HISTORY: [name] 70 y.o. female returns for  regular  visit for followup of IgG lambda multiple myeloma with 2 M spikes. She presented initially as an MGUS in 1997 but evolved into myeloma.   [name] wishes to discuss her medications, her hypertension and her LE edema today.    The patient wanted to review her medications, particularly her anti-emetics.  She does not take them she says.  I explained what they are for and she reports that she does not have any nausea or vomiting.  I explained that she is to take the medications in the event that she has nausea or vomiting.  I told her to hold on to these and put them in the cabinet in the event that she may need them.  We also briefly spoke about her HTN medications.  Last time I saw the patient I increased her Bystolic from 5 mg to 10 mg daily.  At this time, I will not alter her medications and I will ask her to follow-up with her cardiologist regarding this.   Due to her LE edema, we will increase her spironolactone to daily dosing (compared to the MWF dosing) to help her diurese while maintaining her K+ level.  She reports that she still has Lasix at home and I have told her to take that daily.   She expressed understanding of this.  I personally reviewed and went  over laboratory results with the patient. Her MM is responding to therapy and she is pleased to hear this.   ROS: No TIA's or unusual headaches, no dysphagia.  No prolonged cough. No dyspnea or chest pain on exertion.  No abdominal pain, change in bowel habits, black or bloody stools.  No urinary tract symptoms.  No new or unusual musculoskeletal symptoms.  Normal menses, no abnormal vaginal bleeding, discharge or unexpected pelvic pain. No new breast lumps, breast pain or nipple discharge.   Past Medical History  Diagnosis Date  . Essential hypertension, benign   . Coronary atherosclerosis of native coronary artery     Former patient of [facility] - PTCA apical LAD 2005  . Type 2 diabetes mellitus     Borderline  . GERD (gastroesophageal reflux disease)   . Colon cancer   . Hypercholesteremia   . Multiple myeloma   . Female stress incontinence   . UTI (urinary tract infection)   . Vertigo   . Poor sleep pattern     has MULTIPLE  MYELOMA; HYPERLIPIDEMIA; Essential hypertension, benign; Coronary atherosclerosis of native coronary artery; Edema leg; HX, PERSONAL, MALIGNANCY, COLON; Vertigo; and Palpitations on her problem list.     is allergic to gabapentin and simvastatin.  [name] had no medications administered during this visit.  Past Surgical History  Procedure Date  . Colon surgery  Resection, [facility]  . Abdominal hysterectomy 1970s    [facility]  . Knee arthroscopy     LEFT, [facility]  . Heel spur surgery     KEELING, [facility]  . Portacath placement     [facility]   . Cervical spine surgery 2004    [facility]  . Port-a-cath removal 11/05/2010    Procedure: REMOVAL PORT-A-CATH;  Surgeon: Dalia Heading;  Location: [facility];  Service: General;  Laterality: Left;  started at 0846  . Portacath placement 11/05/2010    Procedure: INSERTION PORT-A-CATH;  Surgeon: Dalia Heading;  Location: [facility];  Service: General;  Laterality: Right;  Right subclavian, device left accessed     Denies any headaches,  dizziness, double vision, fevers, chills, night sweats, nausea, vomiting, diarrhea, constipation, chest pain, heart palpitations, shortness of breath, blood in stool, black tarry stool, urinary pain, urinary burning, urinary frequency, hematuria.   PHYSICAL EXAMINATION  ECOG PERFORMANCE STATUS: 2 - Symptomatic, <50% confined to bed  There were no vitals filed for this visit.  GENERAL:alert, no distress, well nourished, well developed, comfortable, cooperative and smiling SKIN: skin color, texture, turgor are normal, no rashes or significant lesions HEAD: Normocephalic, No masses, lesions, tenderness or abnormalities EYES: normal EARS: External ears normal OROPHARYNX:mucous membranes are moist  NECK: supple, trachea midline LYMPH:  no palpable lymphadenopathy BREAST:not examined LUNGS: clear to auscultation  HEART: regular rate & rhythm, no murmurs, no gallops, S1 normal and S2 normal ABDOMEN:abdomen soft, non-tender, obese and normal bowel sounds BACK: Back symmetric, no curvature., No CVA tenderness EXTREMITIES:less then 2 second capillary refill, no joint deformities, effusion, or inflammation, no skin discoloration, no clubbing, no cyanosis, positive findings:  edema B/L 2+ LE edema.  NEURO: alert & oriented x 3 with fluent speech, no focal motor/sensory deficits, gait normal   LABORATORY DATA: CBC    Component Value Date/Time   WBC 6.7 06/01/2011 1031   RBC 3.21* 06/01/2011 1031   HGB 9.6* 06/02/2011 1011   HCT 29.6* 06/02/2011 1011   PLT 139* 06/01/2011 1031   MCV 95.0 06/01/2011 1031   MCH 30.8 06/01/2011 1031   MCHC 32.5 06/01/2011 1031   RDW 14.8 06/01/2011 1031   LYMPHSABS 0.8 06/01/2011 1031   MONOABS 0.7 06/01/2011 1031   EOSABS 0.1 06/01/2011 1031   BASOSABS 0.0 06/01/2011 1031    Results for [name] (MRN [id number]) as of 06/02/2011 12:40  Ref. Range 06/02/2011 10:11  Sodium Latest Range: 135-145 mEq/L 139  Potassium Latest Range: 3.5-5.1 mEq/L 3.8  Chloride Latest Range: 96-112  mEq/L 104  CO2 Latest Range: 19-32 mEq/L 26  BUN Latest Range: 6-23 mg/dL 20  Creat Latest Range: 0.50-1.10 mg/dL 4.09 (H)  Calcium Latest Range: 8.4-10.5 mg/dL 81.1  GFR calc non Af Amer Latest Range: >90 mL/min 39 (L)  GFR calc Af Amer Latest Range: >90 mL/min 46 (L)  Glucose Latest Range: 70-99 mg/dL 914 (H)  Phosphorus Latest Range: 2.3-4.6 mg/dL 2.3  Albumin Latest Range: 55.8-66.1 % 3.5      ASSESSMENT:  1. B/L LE 4+ pitting edema 2. 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 She presented initially as an MGUS in 1997 but evolved into myeloma within the last several years. S/P 3 cycles of Carfilzomib, now on cycle 4 day 9. S/P Revlimid and dexamethasone but developed Revlimid-induced skin toxicity with contraction of her skin of the legs as well as significant peripheral neuropathies. That is down to a grade 1 neuropathy now. Next, she was treated with  Velcade and developed more neuropathic symptomatology to the point that she could not walk and was always coming up here in a wheelchair. Could hardly get around at home. She has been most recently treated with Cytoxan and dexamethasone. That was marginally effective at best.  3. Hypertension   PLAN:  1. Will increase Spironolactone to daily dosing instead of M,W,F until LE edema improves. 2. Continue Lasix at home.   3. Will maintain BP medications.  I have asked her to follow-up with Cardiology regarding her BP control.  She anticipates visiting his office today. 4. Pre-chemo lab work ordered as standing orders: CBC diff 5. Monthly lab work standing orders: MM panel and CMET 6. Follow-up with cardiologist as scheduled. 7. Return in one month for follow-up.   All questions were answered. The patient knows to call the clinic with any problems, questions or concerns. We can certainly see the patient much sooner if necessary.  I spent 15 minutes counseling the patient face to face. The total time spent in the appointment was 25  minutes.  KEFALAS,THOMAS

## 2011-06-02 NOTE — Telephone Encounter (Signed)
Pt has questions about who needs to come by and hook up her monitor. She thinks some one told her at last visit we would send someone to her house to hook it up for her. She was also told to let us know that her bp has been 05/26/11 -185/104 , 2/6- 175/76,  06/02/11- 226/97 and  215/95.

## 2011-06-03 ENCOUNTER — Encounter: Payer: Medicare HMO | Admitting: *Deleted

## 2011-06-03 ENCOUNTER — Ambulatory Visit (HOSPITAL_COMMUNITY): Payer: Medicare HMO | Admitting: Oncology

## 2011-06-03 LAB — VITAMIN D 25 HYDROXY (VIT D DEFICIENCY, FRACTURES): Vit D, 25-Hydroxy: 19 ng/mL — ABNORMAL LOW (ref 30–89)

## 2011-06-03 LAB — PTH, INTACT AND CALCIUM: Calcium, Total (PTH): 9.4 mg/dL (ref 8.4–10.5)

## 2011-06-08 ENCOUNTER — Telehealth (HOSPITAL_COMMUNITY): Payer: Self-pay | Admitting: *Deleted

## 2011-06-08 ENCOUNTER — Encounter (HOSPITAL_BASED_OUTPATIENT_CLINIC_OR_DEPARTMENT_OTHER): Payer: Medicare HMO | Admitting: Oncology

## 2011-06-08 ENCOUNTER — Encounter (HOSPITAL_BASED_OUTPATIENT_CLINIC_OR_DEPARTMENT_OTHER): Payer: Medicare HMO

## 2011-06-08 VITALS — BP 171/98 | HR 67 | Temp 98.9°F | Ht 65.0 in | Wt 208.6 lb

## 2011-06-08 DIAGNOSIS — C9000 Multiple myeloma not having achieved remission: Secondary | ICD-10-CM

## 2011-06-08 DIAGNOSIS — Z5112 Encounter for antineoplastic immunotherapy: Secondary | ICD-10-CM

## 2011-06-08 DIAGNOSIS — N289 Disorder of kidney and ureter, unspecified: Secondary | ICD-10-CM

## 2011-06-08 DIAGNOSIS — R609 Edema, unspecified: Secondary | ICD-10-CM

## 2011-06-08 LAB — COMPREHENSIVE METABOLIC PANEL
ALT: 10 U/L (ref 0–35)
Alkaline Phosphatase: 38 U/L — ABNORMAL LOW (ref 39–117)
CO2: 28 mEq/L (ref 19–32)
Calcium: 9.3 mg/dL (ref 8.4–10.5)
Chloride: 106 mEq/L (ref 96–112)
GFR calc Af Amer: 51 mL/min — ABNORMAL LOW (ref >90)
GFR calc non Af Amer: 44 mL/min — ABNORMAL LOW (ref >90)
Glucose, Bld: 117 mg/dL — ABNORMAL HIGH (ref 70–99)
Sodium: 139 mEq/L (ref 135–145)
Total Bilirubin: 0.5 mg/dL (ref 0.3–1.2)

## 2011-06-08 LAB — DIFFERENTIAL
Basophils Absolute: 0 10*3/uL (ref 0.0–0.1)
Basophils Relative: 0 % (ref 0–1)
Eosinophils Absolute: 0.1 10*3/uL (ref 0.0–0.7)
Eosinophils Relative: 1 % (ref 0–5)
Lymphocytes Relative: 14 % (ref 12–46)

## 2011-06-08 LAB — CBC
MCHC: 32.4 g/dL (ref 30.0–36.0)
Platelets: 121 10*3/uL — ABNORMAL LOW (ref 150–400)
RDW: 15.2 % (ref 11.5–15.5)
WBC: 7.5 10*3/uL (ref 4.0–10.5)

## 2011-06-08 MED ORDER — HEPARIN SOD (PORK) LOCK FLUSH 100 UNIT/ML IV SOLN
INTRAVENOUS | Status: AC
  Filled 2011-06-08: qty 5

## 2011-06-08 MED ORDER — HEPARIN SOD (PORK) LOCK FLUSH 100 UNIT/ML IV SOLN
500.0000 [IU] | Freq: Once | INTRAVENOUS | Status: AC | PRN
  Administered 2011-06-08: 500 [IU]
  Filled 2011-06-08: qty 5

## 2011-06-08 MED ORDER — SODIUM CHLORIDE 0.9 % IJ SOLN
10.0000 mL | INTRAMUSCULAR | Status: DC | PRN
  Administered 2011-06-08: 10 mL
  Filled 2011-06-08: qty 10

## 2011-06-08 MED ORDER — DEXAMETHASONE SODIUM PHOSPHATE 10 MG/ML IJ SOLN: 10.0000 mg | Freq: Once | INTRAMUSCULAR | Status: DC

## 2011-06-08 MED ORDER — SODIUM CHLORIDE 0.9 % IV SOLN: 8.0000 mg | Freq: Once | INTRAVENOUS | Status: DC

## 2011-06-08 MED ORDER — SODIUM CHLORIDE 0.9 % IV SOLN
Freq: Once | INTRAVENOUS | Status: AC
  Administered 2011-06-08: 11:00:00 via INTRAVENOUS

## 2011-06-08 MED ORDER — DEXTROSE 5 % IV SOLN
20.0000 mg/m2 | Freq: Once | INTRAVENOUS | Status: AC
  Administered 2011-06-08: 40 mg via INTRAVENOUS
  Filled 2011-06-08: qty 20

## 2011-06-08 MED ORDER — SODIUM CHLORIDE 0.9 % IJ SOLN
INTRAMUSCULAR | Status: AC
  Administered 2011-06-08: 10 mL
  Filled 2011-06-08: qty 10

## 2011-06-08 MED ORDER — SODIUM CHLORIDE 0.9 % IV SOLN
Freq: Once | INTRAVENOUS | Status: AC
  Administered 2011-06-08: 8 mg via INTRAVENOUS
  Filled 2011-06-08: qty 4

## 2011-06-08 MED ORDER — SODIUM CHLORIDE 0.9 % IV SOLN: Freq: Once | INTRAVENOUS | Status: DC

## 2011-06-08 MED ORDER — GI COCKTAIL ~~LOC~~
30.0000 mL | Freq: Once | ORAL | Status: AC
Start: 2011-06-08 — End: 2011-06-08
  Administered 2011-06-08: 30 mL via ORAL
  Filled 2011-06-08: qty 30

## 2011-06-08 NOTE — Telephone Encounter (Signed)
Pt states she needs refill on Furosemide thru [facility]. I can call this in for her, just need instructions on frequency and dosage. She was last told to take 20 mg ---two a day.

## 2011-06-08 NOTE — Progress Notes (Signed)
1110 GI Cocktail 30 ml given po for c/o upper abdominal pain and discomfort. 1145 Dr. Mariel Sleet  In to see pt.  She stated to him that GI cocktail helped for a little while but pain was back and at same intensity.  Abdominal US ordered bu Dr. Mariel Sleet. For tomorrow morning.

## 2011-06-08 NOTE — Progress Notes (Signed)
CC:   Melvyn Novas, MD Jorja Loa, M.D.  DIAGNOSES: 1. IgG lambda multiple myeloma, actually with 2 M-spikes in the past,     presented initially in 1997 with an monoclonal gammopathy of     undetermined significance. 2. Chronic lower extremity leg edema. 3. Peripheral neuropathy, grade 1-2, still with significant pain in a     stocking distribution after treatment with Revlimid. 4. Revlimid-induced skin toxicity with contraction of the skin of the     legs and feet. 5. Abdominal pain, aching in nature, epigastric in location, not     associated with changes by use of meals or medications other than     her pain medication takes it away for 15 minutes and then it is     back.  We also gave her a GI cocktail this morning, took it away     for about 15 minutes and it is back. 6. Obesity. 7. Mild renal insufficiency. 8. Hyperparathyroidism, which may be secondary, and is being evaluated     by Dr. Kristian Covey presently.  INTERVAL HISTORY:  [name] is in great spirits and the carfilzomib, which she is on, is working very, very nicely.  Her M-spike has declined significantly.  Her lambda levels have also dropped and were awaiting one from today.  We have kept her carfilzomib at 20 mg m2, however.  PHYSICAL EXAMINATION TODAY:  Vital Signs:  she has stable vital signs. No fever with this abdominal pain.  No nausea or vomiting with it either.  Lymph Nodes:  She has no lymphadenopathy in the cervical, supraclavicular, infraclavicular, axillary, or inguinal areas.  Abdomen: Very soft.  Bowel sounds are normal to slightly decreased.  There is no hepatosplenomegaly.  She has a little direct tenderness in the epigastric area but nothing else.  No rebound.  No ascites. Extremities:  She has leg edema which is 1+ and pitting in the pretibial area.  Lungs:  Clear to auscultation.  Heart:  Regular rhythm and rate. The rate is right around 68.  There is no S3 gallop.  Port is  intact.  PLAN:  I will get an echo of her abdomen to make sure she does not have gallstones. We will see if that shows anything.  We will see her back in about a month and keep therapy going for right now.    ______________________________ Ladona Horns. Mariel Sleet, MD ESN/MEDQ  D:  06/08/2011  T:  06/08/2011  Job:  [id number]

## 2011-06-08 NOTE — Telephone Encounter (Signed)
Did ok. 

## 2011-06-08 NOTE — Progress Notes (Signed)
This office note has been dictated.

## 2011-06-09 ENCOUNTER — Encounter (HOSPITAL_BASED_OUTPATIENT_CLINIC_OR_DEPARTMENT_OTHER): Payer: Medicare HMO

## 2011-06-09 ENCOUNTER — Telehealth (HOSPITAL_COMMUNITY): Payer: Self-pay | Admitting: *Deleted

## 2011-06-09 ENCOUNTER — Ambulatory Visit (HOSPITAL_COMMUNITY)
Admission: RE | Admit: 2011-06-09 | Discharge: 2011-06-09 | Disposition: A | Discharge status: Home / Self Care | Payer: Medicare HMO | Source: Ambulatory Visit | Attending: Oncology | Admitting: Oncology

## 2011-06-09 VITALS — BP 179/112 | HR 62 | Temp 99.1°F

## 2011-06-09 DIAGNOSIS — C9000 Multiple myeloma not having achieved remission: Secondary | ICD-10-CM

## 2011-06-09 DIAGNOSIS — R1011 Right upper quadrant pain: Secondary | ICD-10-CM | POA: Insufficient documentation

## 2011-06-09 DIAGNOSIS — Z5112 Encounter for antineoplastic immunotherapy: Secondary | ICD-10-CM

## 2011-06-09 LAB — KAPPA/LAMBDA LIGHT CHAINS
Kappa free light chain: 0.78 mg/dL (ref 0.33–1.94)
Kappa, lambda light chain ratio: 0.58 (ref 0.26–1.65)
Lambda free light chains: 1.35 mg/dL (ref 0.57–2.63)

## 2011-06-09 MED ORDER — HEPARIN SOD (PORK) LOCK FLUSH 100 UNIT/ML IV SOLN
500.0000 [IU] | Freq: Once | INTRAVENOUS | Status: AC | PRN
  Administered 2011-06-09: 500 [IU]
  Filled 2011-06-09: qty 5

## 2011-06-09 MED ORDER — DEXAMETHASONE SODIUM PHOSPHATE 10 MG/ML IJ SOLN: 10.0000 mg | Freq: Once | INTRAMUSCULAR | Status: DC

## 2011-06-09 MED ORDER — HEPARIN SOD (PORK) LOCK FLUSH 100 UNIT/ML IV SOLN
INTRAVENOUS | Status: AC
  Filled 2011-06-09: qty 5

## 2011-06-09 MED ORDER — SODIUM CHLORIDE 0.9 % IV SOLN
Freq: Once | INTRAVENOUS | Status: AC
  Administered 2011-06-09: 8 mg via INTRAVENOUS
  Filled 2011-06-09: qty 4

## 2011-06-09 MED ORDER — DEXTROSE 5 % IV SOLN
20.0000 mg/m2 | Freq: Once | INTRAVENOUS | Status: AC
  Administered 2011-06-09: 40 mg via INTRAVENOUS
  Filled 2011-06-09: qty 20

## 2011-06-09 MED ORDER — SODIUM CHLORIDE 0.9 % IJ SOLN
10.0000 mL | INTRAMUSCULAR | Status: DC | PRN
  Administered 2011-06-09: 10 mL
  Filled 2011-06-09: qty 10

## 2011-06-09 MED ORDER — SODIUM CHLORIDE 0.9 % IV SOLN: 8.0000 mg | Freq: Once | INTRAVENOUS | Status: DC

## 2011-06-09 MED ORDER — SODIUM CHLORIDE 0.9 % IV SOLN
Freq: Once | INTRAVENOUS | Status: AC
  Administered 2011-06-09: 10:00:00 via INTRAVENOUS

## 2011-06-09 MED ORDER — SODIUM CHLORIDE 0.9 % IJ SOLN
INTRAMUSCULAR | Status: AC
  Filled 2011-06-09: qty 10

## 2011-06-09 NOTE — Telephone Encounter (Signed)
She should be only on 20 mg once a day #90 Ref X 1  Lasix called in as stated above to [facility].

## 2011-06-10 ENCOUNTER — Telehealth (HOSPITAL_COMMUNITY): Payer: Self-pay

## 2011-06-10 ENCOUNTER — Telehealth (HOSPITAL_COMMUNITY): Payer: Self-pay | Admitting: *Deleted

## 2011-06-10 NOTE — Telephone Encounter (Signed)
Msg left to call clinic with any problems, issues, or questions.

## 2011-06-10 NOTE — Telephone Encounter (Signed)
Message left on pt's answering machine as below. Instruct pt to call us with any questions.

## 2011-06-10 NOTE — Telephone Encounter (Signed)
Message copied by Dennie Maizes on Fri Jun 10, 2011 10:12 AM ------      Message from: Mariel Sleet, ERIC S      Created: Fri Jun 10, 2011  9:07 AM       Call her -U/S fine-how is her pain??

## 2011-06-13 ENCOUNTER — Other Ambulatory Visit: Payer: Self-pay

## 2011-06-13 ENCOUNTER — Ambulatory Visit (INDEPENDENT_AMBULATORY_CARE_PROVIDER_SITE_OTHER): Payer: Medicare HMO | Admitting: Adult Health

## 2011-06-13 ENCOUNTER — Encounter: Payer: Self-pay | Admitting: Adult Health

## 2011-06-13 DIAGNOSIS — I10 Essential (primary) hypertension: Secondary | ICD-10-CM

## 2011-06-13 DIAGNOSIS — C9000 Multiple myeloma not having achieved remission: Secondary | ICD-10-CM

## 2011-06-13 DIAGNOSIS — R002 Palpitations: Secondary | ICD-10-CM

## 2011-06-13 MED ORDER — NEBIVOLOL HCL 5 MG PO TABS: ORAL_TABLET | ORAL | Status: DC

## 2011-06-13 MED ORDER — VALSARTAN 320 MG PO TABS: 320.0000 mg | ORAL_TABLET | Freq: Every day | ORAL | Status: DC

## 2011-06-13 NOTE — Patient Instructions (Addendum)
**Note De-Identified [name] Obfuscation** Your physician has requested that you regularly monitor and record your blood pressure readings at home. Please use the same machine at the same time of day to check your readings and record them to bring to your follow-up visit. Please check at noon.  Your physician has recommended you make the following change in your medication: increase Diovan to 360 mg daily and take 10 mg of Bystolic in the mornings and 5 mg in the evenings.   Your physician recommends that you return for lab work in: 2 weeks  Your physician recommends that you schedule a follow-up appointment in: 2 weeks for blood pressure check with nurse and in 1 month with provider

## 2011-06-13 NOTE — Progress Notes (Signed)
.   HPI: [name] is a 71 y/o female patient of Dr. Diona Browner we are seeing for ongoing assessment and treatment of hypertension, palpiations and shortness of breath. On last visit a cardionet heart monitor was placed. She denies any chest pain, edema,  but has some shortness of breath. She continues to have some palpitations on occasion but not often and very short in duration. Otherwise she is still being followed by Dr. Mariel Sleet for chronic anemia, with improvement of status. S  Allergies  Allergen Reactions  . Gabapentin Other (See Comments)    Difficulty with speech  . Simvastatin     REACTION: leg cramps    Current Outpatient Prescriptions  Medication Sig Dispense Refill  . acetaminophen (TYLENOL) 325 MG tablet Take 650 mg by mouth every 4 (four) hours as needed.        Marland Kitchen acyclovir (ZOVIRAX) 400 MG tablet Take 400 mg by mouth daily.       Marland Kitchen aspirin 81 MG EC tablet Take 81 mg by mouth every morning.        Marland Kitchen dexamethasone (DECADRON) 4 MG tablet Take 2 tablets by mouth daily starting the day after chemotherapy for 2 days. Take with food.  30 tablet  1  . dexlansoprazole (DEXILANT) 60 MG capsule Take 60 mg by mouth daily.       . furosemide (LASIX) 20 MG tablet Take 1 tablet (20 mg total) by mouth daily.  30 tablet  0  . LORazepam (ATIVAN) 0.5 MG tablet Take 1 tablet (0.5 mg total) by mouth every 6 (six) hours as needed (Nausea or vomiting).  30 tablet  2  . nebivolol (BYSTOLIC) 5 MG tablet Take 10 mg (whole tablet) in the am and 5 mg (1/2 tablet) in the pm  45 tablet  3  . omeprazole (PRILOSEC) 20 MG capsule Take 20 mg by mouth 2 (two) times daily.       . ondansetron (ZOFRAN) 8 MG tablet Take 1 tablet two times a day starting the day after chemo for 2 days. Then take 1 tablet two times a day as needed for nausea or vomiting.  30 tablet  2  . oxybutynin (DITROPAN) 5 MG tablet Take 5 mg by mouth daily.       . potassium chloride SA (K-DUR,KLOR-CON) 20 MEQ tablet Take 20 mEq by mouth 2  (two) times daily.       . prochlorperazine (COMPAZINE) 25 MG suppository Place 1 suppository (25 mg total) rectally every 6 (six) hours as needed for nausea.  12 suppository  2  . rosuvastatin (CRESTOR) 20 MG tablet Take 20 mg by mouth at bedtime.        Marland Kitchen spironolactone (ALDACTONE) 50 MG tablet Take 50 mg by mouth 3 (three) times a week.       . valsartan (DIOVAN) 320 MG tablet Take 1 tablet (320 mg total) by mouth daily.  30 tablet  3    Past Medical History  Diagnosis Date  . Essential hypertension, benign   . Coronary atherosclerosis of native coronary artery     Former patient of [facility] - PTCA apical LAD 2005  . Type 2 diabetes mellitus     Borderline  . GERD (gastroesophageal reflux disease)   . Colon cancer   . Hypercholesteremia   . Multiple myeloma   . Female stress incontinence   . UTI (urinary tract infection)   . Vertigo   . Poor sleep pattern     Past Surgical  History  Procedure Date  . Colon surgery     Resection, [facility]  . Abdominal hysterectomy 1970s    [facility]  . Knee arthroscopy     LEFT, [facility]  . Heel spur surgery     KEELING, [facility]  . Portacath placement     [facility]   . Cervical spine surgery 2004    [facility]  . Port-a-cath removal 11/05/2010    Procedure: REMOVAL PORT-A-CATH;  Surgeon: Dalia Heading;  Location: [facility];  Service: General;  Laterality: Left;  started at 0846  . Portacath placement 11/05/2010    Procedure: INSERTION PORT-A-CATH;  Surgeon: Dalia Heading;  Location: [facility];  Service: General;  Laterality: Right;  Right subclavian, device left accessed     UJW:JXBJYN of systems complete and found to be negative unless listed above PHYSICAL EXAM BP 192/90  Pulse 72  Resp 18  Ht 5\' 5"  (1.651 m)  Wt 208 lb (94.348 kg)  BMI 34.61 kg/m2  General: Well developed, well nourished, in no acute distress Head: Eyes PERRLA, No xanthomas.   Normal cephalic and atramatic  Lungs: Clear bilaterally to auscultation and percussion. Heart: HRRR S1 S2, without  Soft  1/6  Systolic murmur.  Pulses are 2+ & equal.            No carotid bruit. No JVD.  No abdominal bruits. No femoral bruits. Abdomen: Bowel sounds are positive, abdomen soft and non-tender without masses or                  Hernia's noted. Msk:  Back normal, normal gait. Normal strength and tone for age. Extremities: No clubbing, cyanosis or edema.  DP +1 Neuro: Alert and oriented X 3. Psych:  Good affect, responds appropriately    ASSESSMENT AND PLAN

## 2011-06-13 NOTE — Assessment & Plan Note (Signed)
Blood pressure is not controlled despite multiple medications. I will maximize current medications before adding an additional agent. I will increase dovan to 320 mg  Daily. Bystolic will be increased to 10mg  in am and keep her at 5 mg at night. She will continue other medications as prescribed. She will take BP readings at home and record for me. Follow-up BP check in 2 weeks, and appointment with BMET in one month.

## 2011-06-13 NOTE — Assessment & Plan Note (Signed)
Heart monitor showed one episode of NSVT with rate of 153 bpm. No recurrence of this on further recordings. Increasing bystolic dose to 10 mg will afford some help with this continue to follow. Review of echo showed hyperdynamic EF of 70%. NO wall motion abnormalities. LVH was noted.

## 2011-06-16 ENCOUNTER — Other Ambulatory Visit: Payer: Self-pay | Admitting: *Deleted

## 2011-06-16 DIAGNOSIS — I10 Essential (primary) hypertension: Secondary | ICD-10-CM

## 2011-06-24 NOTE — Progress Notes (Signed)
**Note De-Identified [name] Obfuscation** Addended by: Demetrios Loll on: 06/24/2011 11:48 AM   Modules accepted: Orders

## 2011-06-27 ENCOUNTER — Other Ambulatory Visit: Payer: Self-pay | Admitting: Cardiology

## 2011-06-27 DIAGNOSIS — R002 Palpitations: Secondary | ICD-10-CM

## 2011-07-06 ENCOUNTER — Encounter (HOSPITAL_COMMUNITY): Payer: Self-pay | Admitting: Oncology

## 2011-07-06 ENCOUNTER — Ambulatory Visit (HOSPITAL_COMMUNITY): Payer: Medicare HMO | Admitting: Oncology

## 2011-07-06 ENCOUNTER — Encounter (HOSPITAL_COMMUNITY): Payer: Medicare HMO | Attending: Oncology | Admitting: Oncology

## 2011-07-06 ENCOUNTER — Other Ambulatory Visit (HOSPITAL_COMMUNITY): Payer: Medicare HMO

## 2011-07-06 VITALS — BP 114/74 | HR 82 | Temp 98.7°F | Wt 203.5 lb

## 2011-07-06 DIAGNOSIS — C9000 Multiple myeloma not having achieved remission: Secondary | ICD-10-CM | POA: Insufficient documentation

## 2011-07-06 NOTE — Patient Instructions (Signed)
[facility]  Discharge Instructions  RECOMMENDATIONS MADE BY THE CONSULTANT AND ANY TEST RESULTS WILL BE SENT TO YOUR REFERRING DOCTOR.  SPECIAL INSTRUCTIONS/FOLLOW-UP: Lab work Needed in the morning before therapy, and therapy tomorrow.     I acknowledge that I have been informed and understand all the instructions given to me and received a copy. I do not have any more questions at this time, but understand that I may call the Specialty Clinic at [facility] at [phone] during business hours should I have any further questions or need assistance in obtaining follow-up care.    __________________________________________  _____________  __________ Signature of Patient or Authorized Representative            Date                   Time    __________________________________________ Nurse's Signature

## 2011-07-06 NOTE — Progress Notes (Signed)
Diane Stalling, MD, MD [address] [place] [address]  1. MULTIPLE  MYELOMA     CURRENT THERAPY: S/P 4 cycles of Carfilzomib.  INTERVAL HISTORY: Diane [name] 71 y.o. female returns for  regular  visit for followup of  IgG lambda multiple myeloma, actually with 2 M-spikes in the past, presented initially in 1997 with an monoclonal gammopathy of undetermined significance.   The patient is doing well.  She was seen by cardiology for assistance with blood pressure control and treatment.  Her Bystolic was increased.  Unfortunately, the patient is unable to continue follow-up with the cardiologist due to financial issues.  As a result, Dr. Janna Ray and [place] will manage her BP.  I personally reviewed and went over radiographic studies with the patient.  Her [place] of abdomen did not reveal any gallstones that may indicate a reason for her abdominal pain.  Today she does not complain of abdominal discomfort.   She was not prepared for lab work today.  She did not put EMLA cream on her port.  Thus, we will perform lab work tomorrow before she starts therapy.    ROS: No TIA's or unusual headaches, no dysphagia.  No prolonged cough. No dyspnea or chest pain on exertion.  No abdominal pain, change in bowel habits, black or bloody stools.  No urinary tract symptoms.  No new or unusual musculoskeletal symptoms.  Normal menses, no abnormal vaginal bleeding, discharge or unexpected pelvic pain. No new breast lumps, breast pain or nipple discharge.  The patient reports that her port has moved slightly inferiorly.  When the nurses flush her port and get blood work, if there is any indication that her catheter insertion has changed, will perform a chest x-ray or a dye study to verify catheter placement.    Past Medical History  Diagnosis Date  . Essential hypertension, benign   . Coronary atherosclerosis of native coronary artery     Former patient of [facility] - PTCA apical LAD 2005  . Type 2 diabetes  mellitus     Borderline  . GERD (gastroesophageal reflux disease)   . Colon cancer   . Hypercholesteremia   . Multiple myeloma   . Female stress incontinence   . UTI (urinary tract infection)   . Vertigo   . Poor sleep pattern     has MULTIPLE  MYELOMA; HYPERLIPIDEMIA; Essential hypertension, benign; Coronary atherosclerosis of native coronary artery; Edema leg; HX, PERSONAL, MALIGNANCY, COLON; Vertigo; and Palpitations on her problem list.     is allergic to gabapentin and simvastatin.  Ms. Shoe does not currently have medications on file.  Past Surgical History  Procedure Date  . Colon surgery     Resection, [facility]  . Abdominal hysterectomy 1970s    [facility]  . Knee arthroscopy     LEFT, [facility]  . Heel spur surgery     KEELING, [facility]  . Portacath placement     [facility]   . Cervical spine surgery 2004    [facility]  . Port-a-cath removal 11/05/2010    Procedure: REMOVAL PORT-A-CATH;  Surgeon: Dalia Heading;  Location: [facility];  Service: General;  Laterality: Left;  started at 0846  . Portacath placement 11/05/2010    Procedure: INSERTION PORT-A-CATH;  Surgeon: Dalia Heading;  Location: [facility];  Service: General;  Laterality: Right;  Right subclavian, device left accessed     Denies any headaches, dizziness, double vision, fevers, chills, night sweats, nausea, vomiting, diarrhea, constipation, chest pain, heart palpitations,  shortness of breath, blood in stool, black tarry stool, urinary pain, urinary burning, urinary frequency, hematuria.   PHYSICAL EXAMINATION  ECOG PERFORMANCE STATUS: 2 - Symptomatic, <50% confined to bed  Filed Vitals:   07/06/11 1340  BP: 114/74  Pulse: 82  Temp: 98.7 F (37.1 C)    GENERAL:alert, no distress, well nourished, well developed, comfortable, cooperative, obese, smiling and seen in wheelchair SKIN: skin color, texture, turgor are normal, no rashes or significant lesions HEAD: Normocephalic, No masses, lesions, tenderness or abnormalities EYES:  normal, PERRLA EARS: External ears normal OROPHARYNX:mucous membranes are moist  NECK: supple, trachea midline LYMPH:  no palpable lymphadenopathy BREAST:not examined LUNGS: clear to auscultation  HEART: regular rate & rhythm, no murmurs, no gallops, S1 normal and S2 normal ABDOMEN:abdomen soft, non-tender, obese and normal bowel sounds BACK: Back symmetric, no curvature., No CVA tenderness EXTREMITIES:less then 2 second capillary refill, no joint deformities, effusion, or inflammation, no skin discoloration, no clubbing, no cyanosis  NEURO: alert & oriented x 3 with fluent speech, no focal motor/sensory deficits   RADIOGRAPHIC STUDIES:  06/08/2011  *RADIOLOGY REPORT*  Clinical Data: right upper quadrant pain, question gallstones,  history of myeloma  ABDOMEN ULTRASOUND LIMITED  Technique: Right upper quadrant ultrasound  Comparison: CT scan 07/27/2009  Findings: No gallstones are noted within gallbladder. No  thickening of the gallbladder wall. No sonographic Murphy's sign.  CBD is within normal limits measures 2.4 mm in diameter.  The liver shows normal echogenicity. Liver measures 15.3 cm in  length. No focal hepatic mass. No intrahepatic biliary ductal  dilatation.  IMPRESSION:  Unremarkable right upper quadrant ultrasound.  Original Report Authenticated By: Natasha Mead, M.D.          ASSESSMENT:  1. IgG lambda multiple myeloma, actually with 2 M-spikes in the past, presented initially in 1997 with an monoclonal gammopathy of undetermined significance.  2. Chronic lower extremity leg edema.  3. Peripheral neuropathy, grade 1-2, still with significant pain in a stocking distribution after treatment with Revlimid.  4. Revlimid-induced skin toxicity with contraction of the skin of the legs and feet.  5. Abdominal pain, aching in nature, epigastric in location, not associated with changes by use of meals or medications other than her pain medication takes it away for 15  minutes and then it is back. We also gave her a GI cocktail this morning, took it away for about 15 minutes and it is back.  Negative Abdominal Ultrasound for gallstones. 6. Obesity.  7. Mild renal insufficiency.  8. Hyperparathyroidism, which may be secondary, and is being evaluated by Dr. Kristian Covey presently.   PLAN:  1. Patient did not prepare herself/port for lab work today.  Therefore we will perform lab work tomorrow before chemotherapy.  2. Lab work tomorrow: CBC diff, CMET, MM panel. 3. Start cycle 5 of chemotherapy tomorrow. Patient has not been treated since 06/09/2011 which was day 16, cycle 4.  I built her treatment plan out for 4 more cycles.  4. I personally reviewed and went over radiographic studies with the patient.  Her [place] was negative for gallstones. 5. Return in one month for follow-up.   All questions were answered. The patient knows to call the clinic with any problems, questions or concerns. We can certainly see the patient much sooner if necessary.  The patient and plan discussed with Glenford Peers, MD and he is in agreement with the aforementioned.  [name]

## 2011-07-07 ENCOUNTER — Encounter (HOSPITAL_BASED_OUTPATIENT_CLINIC_OR_DEPARTMENT_OTHER): Payer: Medicare HMO

## 2011-07-07 VITALS — BP 128/72 | HR 65 | Temp 98.1°F | Ht 65.0 in | Wt 203.0 lb

## 2011-07-07 DIAGNOSIS — Z5112 Encounter for antineoplastic immunotherapy: Secondary | ICD-10-CM

## 2011-07-07 DIAGNOSIS — C9000 Multiple myeloma not having achieved remission: Secondary | ICD-10-CM

## 2011-07-07 LAB — DIFFERENTIAL
Eosinophils Absolute: 0.1 10*3/uL (ref 0.0–0.7)
Eosinophils Relative: 2 % (ref 0–5)
Lymphocytes Relative: 20 % (ref 12–46)
Lymphs Abs: 0.9 10*3/uL (ref 0.7–4.0)
Monocytes Relative: 13 % — ABNORMAL HIGH (ref 3–12)

## 2011-07-07 LAB — COMPREHENSIVE METABOLIC PANEL
ALT: 8 U/L (ref 0–35)
Alkaline Phosphatase: 37 U/L — ABNORMAL LOW (ref 39–117)
BUN: 20 mg/dL (ref 6–23)
CO2: 24 mEq/L (ref 19–32)
Calcium: 9.9 mg/dL (ref 8.4–10.5)
GFR calc Af Amer: 41 mL/min — ABNORMAL LOW (ref >90)
GFR calc non Af Amer: 35 mL/min — ABNORMAL LOW (ref >90)
Glucose, Bld: 114 mg/dL — ABNORMAL HIGH (ref 70–99)
Total Protein: 6.8 g/dL (ref 6.0–8.3)

## 2011-07-07 LAB — CBC
HCT: 30.7 % — ABNORMAL LOW (ref 36.0–46.0)
Hemoglobin: 9.9 g/dL — ABNORMAL LOW (ref 12.0–15.0)
MCH: 30.8 pg (ref 26.0–34.0)
MCV: 95.6 fL (ref 78.0–100.0)
RBC: 3.21 MIL/uL — ABNORMAL LOW (ref 3.87–5.11)
WBC: 4.6 10*3/uL (ref 4.0–10.5)

## 2011-07-07 MED ORDER — DEXAMETHASONE SODIUM PHOSPHATE 10 MG/ML IJ SOLN: 10.0000 mg | Freq: Once | INTRAMUSCULAR | Status: DC

## 2011-07-07 MED ORDER — SODIUM CHLORIDE 0.9 % IV SOLN
Freq: Once | INTRAVENOUS | Status: AC
  Administered 2011-07-07: 11:00:00 via INTRAVENOUS

## 2011-07-07 MED ORDER — SODIUM CHLORIDE 0.9 % IJ SOLN
10.0000 mL | INTRAMUSCULAR | Status: DC | PRN
  Administered 2011-07-07: 10 mL
  Filled 2011-07-07: qty 10

## 2011-07-07 MED ORDER — SODIUM CHLORIDE 0.9 % IV SOLN: 8.0000 mg | Freq: Once | INTRAVENOUS | Status: DC

## 2011-07-07 MED ORDER — HEPARIN SOD (PORK) LOCK FLUSH 100 UNIT/ML IV SOLN
500.0000 [IU] | Freq: Once | INTRAVENOUS | Status: AC | PRN
  Administered 2011-07-07: 500 [IU]
  Filled 2011-07-07: qty 5

## 2011-07-07 MED ORDER — DEXTROSE 5 % IV SOLN
40.0000 mg | Freq: Once | INTRAVENOUS | Status: AC
  Administered 2011-07-07: 40 mg via INTRAVENOUS
  Filled 2011-07-07: qty 20

## 2011-07-07 MED ORDER — DEXTROSE 5 % IV SOLN: 20.0000 mg/m2 | Freq: Once | INTRAVENOUS | Status: DC

## 2011-07-07 MED ORDER — HEPARIN SOD (PORK) LOCK FLUSH 100 UNIT/ML IV SOLN
INTRAVENOUS | Status: AC
  Filled 2011-07-07: qty 5

## 2011-07-07 MED ORDER — SODIUM CHLORIDE 0.9 % IV SOLN
Freq: Once | INTRAVENOUS | Status: AC
  Administered 2011-07-07: 8 mg via INTRAVENOUS
  Filled 2011-07-07 (×2): qty 4

## 2011-07-07 NOTE — Progress Notes (Signed)
Tolerated well

## 2011-07-08 ENCOUNTER — Other Ambulatory Visit (HOSPITAL_COMMUNITY): Payer: Self-pay | Admitting: Oncology

## 2011-07-08 ENCOUNTER — Encounter (HOSPITAL_BASED_OUTPATIENT_CLINIC_OR_DEPARTMENT_OTHER): Payer: Medicare HMO

## 2011-07-08 VITALS — BP 158/92 | HR 85 | Temp 98.9°F

## 2011-07-08 DIAGNOSIS — Z5112 Encounter for antineoplastic immunotherapy: Secondary | ICD-10-CM

## 2011-07-08 DIAGNOSIS — C9000 Multiple myeloma not having achieved remission: Secondary | ICD-10-CM

## 2011-07-08 LAB — KAPPA/LAMBDA LIGHT CHAINS: Lambda free light chains: 3.21 mg/dL — ABNORMAL HIGH (ref 0.57–2.63)

## 2011-07-08 MED ORDER — SODIUM CHLORIDE 0.9 % IV SOLN
Freq: Once | INTRAVENOUS | Status: AC
  Administered 2011-07-08: 8 mg via INTRAVENOUS
  Filled 2011-07-08: qty 4

## 2011-07-08 MED ORDER — SODIUM CHLORIDE 0.9 % IJ SOLN
10.0000 mL | INTRAMUSCULAR | Status: DC | PRN
  Administered 2011-07-08: 10 mL
  Filled 2011-07-08: qty 10

## 2011-07-08 MED ORDER — SODIUM CHLORIDE 0.9 % IV SOLN
Freq: Once | INTRAVENOUS | Status: AC
  Administered 2011-07-08: 10:00:00 via INTRAVENOUS

## 2011-07-08 MED ORDER — DEXTROSE 5 % IV SOLN
20.0000 mg/m2 | Freq: Once | INTRAVENOUS | Status: AC
  Administered 2011-07-08: 40 mg via INTRAVENOUS
  Filled 2011-07-08: qty 20

## 2011-07-08 MED ORDER — ACETAMINOPHEN 325 MG PO TABS
ORAL_TABLET | ORAL | Status: AC
  Filled 2011-07-08: qty 2

## 2011-07-08 MED ORDER — HEPARIN SOD (PORK) LOCK FLUSH 100 UNIT/ML IV SOLN
INTRAVENOUS | Status: AC
  Filled 2011-07-08: qty 5

## 2011-07-08 MED ORDER — HEPARIN SOD (PORK) LOCK FLUSH 100 UNIT/ML IV SOLN
500.0000 [IU] | Freq: Once | INTRAVENOUS | Status: DC | PRN
  Filled 2011-07-08: qty 5

## 2011-07-08 MED ORDER — SODIUM CHLORIDE 0.9 % IV SOLN: 8.0000 mg | Freq: Once | INTRAVENOUS | Status: DC

## 2011-07-08 MED ORDER — DEXAMETHASONE SODIUM PHOSPHATE 10 MG/ML IJ SOLN: 10.0000 mg | Freq: Once | INTRAMUSCULAR | Status: DC

## 2011-07-08 MED ORDER — ACETAMINOPHEN 325 MG PO TABS
650.0000 mg | ORAL_TABLET | Freq: Four times a day (QID) | ORAL | Status: DC | PRN
  Administered 2011-07-08: 650 mg via ORAL

## 2011-07-11 ENCOUNTER — Ambulatory Visit: Payer: Medicare HMO | Admitting: Adult Health

## 2011-07-11 LAB — MULTIPLE MYELOMA PANEL, SERUM
Alpha-1-Globulin: 5 % — ABNORMAL HIGH (ref 2.9–4.9)
Alpha-2-Globulin: 13.6 % — ABNORMAL HIGH (ref 7.1–11.8)
Gamma Globulin: 12.4 % (ref 11.1–18.8)
IgA: 34 mg/dL — ABNORMAL LOW (ref 69–380)
IgG (Immunoglobin G), Serum: 876 mg/dL (ref 690–1700)
M-Spike, %: 0.36 g/dL
Total Protein: 6.5 g/dL (ref 6.0–8.3)

## 2011-07-14 ENCOUNTER — Encounter (HOSPITAL_BASED_OUTPATIENT_CLINIC_OR_DEPARTMENT_OTHER): Payer: Medicare HMO

## 2011-07-14 DIAGNOSIS — Z5112 Encounter for antineoplastic immunotherapy: Secondary | ICD-10-CM

## 2011-07-14 DIAGNOSIS — C9000 Multiple myeloma not having achieved remission: Secondary | ICD-10-CM

## 2011-07-14 LAB — DIFFERENTIAL
Eosinophils Relative: 2 % (ref 0–5)
Lymphocytes Relative: 10 % — ABNORMAL LOW (ref 12–46)
Lymphs Abs: 0.7 10*3/uL (ref 0.7–4.0)
Monocytes Relative: 14 % — ABNORMAL HIGH (ref 3–12)
Neutrophils Relative %: 74 % (ref 43–77)

## 2011-07-14 LAB — CBC
Hemoglobin: 9.9 g/dL — ABNORMAL LOW (ref 12.0–15.0)
MCH: 30.9 pg (ref 26.0–34.0)
MCV: 94.7 fL (ref 78.0–100.0)
Platelets: 144 10*3/uL — ABNORMAL LOW (ref 150–400)
RBC: 3.2 MIL/uL — ABNORMAL LOW (ref 3.87–5.11)
WBC: 7 10*3/uL (ref 4.0–10.5)

## 2011-07-14 MED ORDER — SODIUM CHLORIDE 0.9 % IV SOLN: 8.0000 mg | Freq: Once | INTRAVENOUS | Status: DC

## 2011-07-14 MED ORDER — DEXAMETHASONE 4 MG PO TABS: ORAL_TABLET | ORAL | Status: DC

## 2011-07-14 MED ORDER — HEPARIN SOD (PORK) LOCK FLUSH 100 UNIT/ML IV SOLN
500.0000 [IU] | Freq: Once | INTRAVENOUS | Status: AC | PRN
  Administered 2011-07-14: 500 [IU]
  Filled 2011-07-14: qty 5

## 2011-07-14 MED ORDER — HEPARIN SOD (PORK) LOCK FLUSH 100 UNIT/ML IV SOLN
INTRAVENOUS | Status: AC
  Administered 2011-07-14: 500 [IU]
  Filled 2011-07-14: qty 5

## 2011-07-14 MED ORDER — CARFILZOMIB CHEMO INJECTION 60 MG
20.0000 mg/m2 | Freq: Once | INTRAVENOUS | Status: AC
  Administered 2011-07-14: 40 mg via INTRAVENOUS
  Filled 2011-07-14 (×2): qty 20

## 2011-07-14 MED ORDER — SODIUM CHLORIDE 0.9 % IV SOLN
Freq: Once | INTRAVENOUS | Status: AC
  Administered 2011-07-14: 10:00:00 via INTRAVENOUS

## 2011-07-14 MED ORDER — DEXAMETHASONE SODIUM PHOSPHATE 10 MG/ML IJ SOLN: 10.0000 mg | Freq: Once | INTRAMUSCULAR | Status: DC

## 2011-07-14 MED ORDER — ONDANSETRON HCL 4 MG/2ML IJ SOLN
Freq: Once | INTRAMUSCULAR | Status: AC
  Administered 2011-07-14: 8 mg via INTRAVENOUS
  Filled 2011-07-14: qty 4

## 2011-07-15 ENCOUNTER — Encounter (HOSPITAL_BASED_OUTPATIENT_CLINIC_OR_DEPARTMENT_OTHER): Payer: Medicare HMO

## 2011-07-15 VITALS — BP 174/83 | HR 57 | Temp 98.7°F | Wt 209.0 lb

## 2011-07-15 DIAGNOSIS — Z5112 Encounter for antineoplastic immunotherapy: Secondary | ICD-10-CM

## 2011-07-15 DIAGNOSIS — C9000 Multiple myeloma not having achieved remission: Secondary | ICD-10-CM

## 2011-07-15 MED ORDER — SODIUM CHLORIDE 0.9 % IV SOLN: 8.0000 mg | Freq: Once | INTRAVENOUS | Status: DC

## 2011-07-15 MED ORDER — SODIUM CHLORIDE 0.9 % IV SOLN
Freq: Once | INTRAVENOUS | Status: AC
  Administered 2011-07-15: 8 mg via INTRAVENOUS
  Filled 2011-07-15: qty 4

## 2011-07-15 MED ORDER — SODIUM CHLORIDE 0.9 % IV SOLN
Freq: Once | INTRAVENOUS | Status: AC
  Administered 2011-07-15: 09:00:00 via INTRAVENOUS

## 2011-07-15 MED ORDER — SODIUM CHLORIDE 0.9 % IJ SOLN
10.0000 mL | INTRAMUSCULAR | Status: DC | PRN
  Administered 2011-07-15: 10 mL
  Filled 2011-07-15: qty 10

## 2011-07-15 MED ORDER — DEXTROSE 5 % IV SOLN
20.0000 mg/m2 | Freq: Once | INTRAVENOUS | Status: AC
  Administered 2011-07-15: 40 mg via INTRAVENOUS
  Filled 2011-07-15: qty 20

## 2011-07-15 MED ORDER — DEXAMETHASONE SODIUM PHOSPHATE 10 MG/ML IJ SOLN: 10.0000 mg | Freq: Once | INTRAMUSCULAR | Status: DC

## 2011-07-15 MED ORDER — SODIUM CHLORIDE 0.9 % IJ SOLN
INTRAMUSCULAR | Status: AC
  Administered 2011-07-15: 10 mL
  Filled 2011-07-15: qty 10

## 2011-07-15 MED ORDER — HEPARIN SOD (PORK) LOCK FLUSH 100 UNIT/ML IV SOLN
INTRAVENOUS | Status: AC
  Administered 2011-07-15: 500 [IU]
  Filled 2011-07-15: qty 5

## 2011-07-15 MED ORDER — HEPARIN SOD (PORK) LOCK FLUSH 100 UNIT/ML IV SOLN
500.0000 [IU] | Freq: Once | INTRAVENOUS | Status: AC | PRN
  Administered 2011-07-15: 500 [IU]
  Filled 2011-07-15: qty 5

## 2011-07-20 ENCOUNTER — Encounter (HOSPITAL_BASED_OUTPATIENT_CLINIC_OR_DEPARTMENT_OTHER): Payer: Medicare HMO

## 2011-07-20 VITALS — BP 188/100 | HR 70 | Temp 99.8°F | Ht 65.0 in | Wt 209.0 lb

## 2011-07-20 DIAGNOSIS — Z5112 Encounter for antineoplastic immunotherapy: Secondary | ICD-10-CM

## 2011-07-20 DIAGNOSIS — C9000 Multiple myeloma not having achieved remission: Secondary | ICD-10-CM

## 2011-07-20 LAB — CBC
HCT: 33.7 % — ABNORMAL LOW (ref 36.0–46.0)
Hemoglobin: 10.7 g/dL — ABNORMAL LOW (ref 12.0–15.0)
MCH: 30.6 pg (ref 26.0–34.0)
MCHC: 31.8 g/dL (ref 30.0–36.0)
MCV: 96.3 fL (ref 78.0–100.0)
RDW: 15.3 % (ref 11.5–15.5)

## 2011-07-20 LAB — DIFFERENTIAL
Basophils Absolute: 0 10*3/uL (ref 0.0–0.1)
Basophils Relative: 0 % (ref 0–1)
Eosinophils Relative: 1 % (ref 0–5)
Monocytes Absolute: 0.9 10*3/uL (ref 0.1–1.0)
Monocytes Relative: 9 % (ref 3–12)
Neutro Abs: 8 10*3/uL — ABNORMAL HIGH (ref 1.7–7.7)

## 2011-07-20 MED ORDER — DEXAMETHASONE SODIUM PHOSPHATE 10 MG/ML IJ SOLN: 10.0000 mg | Freq: Once | INTRAMUSCULAR | Status: DC

## 2011-07-20 MED ORDER — SODIUM CHLORIDE 0.9 % IV SOLN
Freq: Once | INTRAVENOUS | Status: AC
  Administered 2011-07-20: 11:00:00 via INTRAVENOUS

## 2011-07-20 MED ORDER — SODIUM CHLORIDE 0.9 % IV SOLN
Freq: Once | INTRAVENOUS | Status: AC
  Administered 2011-07-20: 8 mg via INTRAVENOUS
  Filled 2011-07-20: qty 4

## 2011-07-20 MED ORDER — HEPARIN SOD (PORK) LOCK FLUSH 100 UNIT/ML IV SOLN
INTRAVENOUS | Status: AC
  Filled 2011-07-20: qty 5

## 2011-07-20 MED ORDER — SULFAMETHOXAZOLE-TRIMETHOPRIM 800-160 MG PO TABS: 1.0000 | ORAL_TABLET | Freq: Two times a day (BID) | ORAL | Status: AC

## 2011-07-20 MED ORDER — DEXTROSE 5 % IV SOLN
20.0000 mg/m2 | Freq: Once | INTRAVENOUS | Status: AC
  Administered 2011-07-20: 40 mg via INTRAVENOUS
  Filled 2011-07-20: qty 20

## 2011-07-20 MED ORDER — HEPARIN SOD (PORK) LOCK FLUSH 100 UNIT/ML IV SOLN
500.0000 [IU] | Freq: Once | INTRAVENOUS | Status: AC | PRN
  Administered 2011-07-20: 500 [IU]
  Filled 2011-07-20: qty 5

## 2011-07-20 MED ORDER — SODIUM CHLORIDE 0.9 % IV SOLN: 8.0000 mg | Freq: Once | INTRAVENOUS | Status: DC

## 2011-07-20 NOTE — Progress Notes (Signed)
Patient presented today with headcold and temp 99.5. Discussed with Dr. Mariel Sleet and Jenita Seashore PA and pt assessed by PA. Decision made to proceed with treatment and antibiotic e-scribed per Jenita Seashore PA   Tolerated well.

## 2011-07-21 ENCOUNTER — Encounter (HOSPITAL_BASED_OUTPATIENT_CLINIC_OR_DEPARTMENT_OTHER): Payer: Medicare HMO

## 2011-07-21 VITALS — BP 179/93 | HR 65 | Temp 99.1°F

## 2011-07-21 DIAGNOSIS — C9000 Multiple myeloma not having achieved remission: Secondary | ICD-10-CM

## 2011-07-21 DIAGNOSIS — Z5112 Encounter for antineoplastic immunotherapy: Secondary | ICD-10-CM

## 2011-07-21 MED ORDER — SODIUM CHLORIDE 0.9 % IV SOLN
Freq: Once | INTRAVENOUS | Status: AC
  Administered 2011-07-21: 10:00:00 via INTRAVENOUS

## 2011-07-21 MED ORDER — SODIUM CHLORIDE 0.9 % IV SOLN
8.0000 mg | Freq: Once | INTRAVENOUS | Status: DC
  Filled 2011-07-21: qty 4

## 2011-07-21 MED ORDER — HEPARIN SOD (PORK) LOCK FLUSH 100 UNIT/ML IV SOLN
500.0000 [IU] | Freq: Once | INTRAVENOUS | Status: DC | PRN
  Filled 2011-07-21: qty 5

## 2011-07-21 MED ORDER — HEPARIN SOD (PORK) LOCK FLUSH 100 UNIT/ML IV SOLN
INTRAVENOUS | Status: AC
  Filled 2011-07-21: qty 5

## 2011-07-21 MED ORDER — SODIUM CHLORIDE 0.9 % IJ SOLN
INTRAMUSCULAR | Status: AC
  Filled 2011-07-21: qty 10

## 2011-07-21 MED ORDER — DEXAMETHASONE SODIUM PHOSPHATE 10 MG/ML IJ SOLN: 10.0000 mg | Freq: Once | INTRAMUSCULAR | Status: DC

## 2011-07-21 MED ORDER — SODIUM CHLORIDE 0.9 % IJ SOLN
10.0000 mL | INTRAMUSCULAR | Status: DC | PRN
  Filled 2011-07-21: qty 10

## 2011-07-21 MED ORDER — SODIUM CHLORIDE 0.9 % IV SOLN: 8.0000 mg | Freq: Once | INTRAVENOUS | Status: DC

## 2011-07-21 MED ORDER — SODIUM CHLORIDE 0.9 % IV SOLN
Freq: Once | INTRAVENOUS | Status: AC
  Administered 2011-07-21: 8 mg via INTRAVENOUS
  Filled 2011-07-21: qty 4

## 2011-07-21 MED ORDER — DEXTROSE 5 % IV SOLN
20.0000 mg/m2 | Freq: Once | INTRAVENOUS | Status: AC
  Administered 2011-07-21: 40 mg via INTRAVENOUS
  Filled 2011-07-21: qty 20

## 2011-07-25 ENCOUNTER — Encounter: Payer: Self-pay | Admitting: Oncology

## 2011-07-27 ENCOUNTER — Telehealth (HOSPITAL_COMMUNITY): Payer: Self-pay

## 2011-07-27 NOTE — Telephone Encounter (Signed)
Message copied by Sterling Big on Wed Jul 27, 2011  1:13 PM ------      Message from: Mariel Sleet, ERIC S      Created: Wed Jul 27, 2011 12:30 PM       Yes she can.

## 2011-07-27 NOTE — Telephone Encounter (Signed)
Call from [facility], she wants to know if she can take Alka Seltzer Plus for Colds and Mucinex?

## 2011-07-27 NOTE — Telephone Encounter (Signed)
Patient notified that MD ok'd use of Alka Seltzer Plus for Colds and Mucinex.

## 2011-08-04 ENCOUNTER — Encounter (HOSPITAL_COMMUNITY): Payer: Medicare HMO | Attending: Oncology

## 2011-08-04 DIAGNOSIS — C9000 Multiple myeloma not having achieved remission: Secondary | ICD-10-CM | POA: Insufficient documentation

## 2011-08-04 DIAGNOSIS — Z5112 Encounter for antineoplastic immunotherapy: Secondary | ICD-10-CM

## 2011-08-04 LAB — COMPREHENSIVE METABOLIC PANEL
AST: 12 U/L (ref 0–37)
Albumin: 3.7 g/dL (ref 3.5–5.2)
BUN: 48 mg/dL — ABNORMAL HIGH (ref 6–23)
Calcium: 10.3 mg/dL (ref 8.4–10.5)
Creatinine, Ser: 2.25 mg/dL — ABNORMAL HIGH (ref 0.50–1.10)

## 2011-08-04 LAB — CBC
MCH: 31.3 pg (ref 26.0–34.0)
MCHC: 32.5 g/dL (ref 30.0–36.0)
MCV: 96.6 fL (ref 78.0–100.0)
Platelets: 192 10*3/uL (ref 150–400)
RBC: 3.19 MIL/uL — ABNORMAL LOW (ref 3.87–5.11)

## 2011-08-04 LAB — DIFFERENTIAL
Basophils Absolute: 0 10*3/uL (ref 0.0–0.1)
Basophils Relative: 0 % (ref 0–1)
Eosinophils Absolute: 0.1 10*3/uL (ref 0.0–0.7)
Eosinophils Relative: 2 % (ref 0–5)
Monocytes Absolute: 0.3 10*3/uL (ref 0.1–1.0)
Monocytes Relative: 8 % (ref 3–12)
Neutro Abs: 2.9 10*3/uL (ref 1.7–7.7)

## 2011-08-04 MED ORDER — SODIUM CHLORIDE 0.9 % IV SOLN
Freq: Once | INTRAVENOUS | Status: AC
  Administered 2011-08-04: 10:00:00 via INTRAVENOUS

## 2011-08-04 MED ORDER — DEXAMETHASONE SODIUM PHOSPHATE 10 MG/ML IJ SOLN: 10.0000 mg | Freq: Once | INTRAMUSCULAR | Status: DC

## 2011-08-04 MED ORDER — DEXTROSE 5 % IV SOLN
20.0000 mg/m2 | Freq: Once | INTRAVENOUS | Status: AC
  Administered 2011-08-04: 40 mg via INTRAVENOUS
  Filled 2011-08-04: qty 20

## 2011-08-04 MED ORDER — HEPARIN SOD (PORK) LOCK FLUSH 100 UNIT/ML IV SOLN
INTRAVENOUS | Status: AC
  Filled 2011-08-04: qty 5

## 2011-08-04 MED ORDER — SODIUM CHLORIDE 0.9 % IV SOLN: 8.0000 mg | Freq: Once | INTRAVENOUS | Status: DC

## 2011-08-04 MED ORDER — SODIUM CHLORIDE 0.9 % IJ SOLN
10.0000 mL | INTRAMUSCULAR | Status: DC | PRN
  Filled 2011-08-04: qty 10

## 2011-08-04 MED ORDER — HEPARIN SOD (PORK) LOCK FLUSH 100 UNIT/ML IV SOLN
500.0000 [IU] | Freq: Once | INTRAVENOUS | Status: AC | PRN
  Administered 2011-08-04: 500 [IU]
  Filled 2011-08-04: qty 5

## 2011-08-04 MED ORDER — SODIUM CHLORIDE 0.9 % IV SOLN
Freq: Once | INTRAVENOUS | Status: AC
  Administered 2011-08-04: 8 mg via INTRAVENOUS
  Filled 2011-08-04: qty 4

## 2011-08-05 ENCOUNTER — Encounter (HOSPITAL_BASED_OUTPATIENT_CLINIC_OR_DEPARTMENT_OTHER): Payer: Medicare HMO

## 2011-08-05 VITALS — BP 106/71 | HR 88 | Temp 98.2°F

## 2011-08-05 DIAGNOSIS — C9000 Multiple myeloma not having achieved remission: Secondary | ICD-10-CM

## 2011-08-05 DIAGNOSIS — Z5112 Encounter for antineoplastic immunotherapy: Secondary | ICD-10-CM

## 2011-08-05 MED ORDER — SODIUM CHLORIDE 0.9 % IV SOLN: 8.0000 mg | Freq: Once | INTRAVENOUS | Status: DC

## 2011-08-05 MED ORDER — DEXAMETHASONE SODIUM PHOSPHATE 10 MG/ML IJ SOLN: 10.0000 mg | Freq: Once | INTRAMUSCULAR | Status: DC

## 2011-08-05 MED ORDER — HEPARIN SOD (PORK) LOCK FLUSH 100 UNIT/ML IV SOLN
INTRAVENOUS | Status: AC
  Filled 2011-08-05: qty 5

## 2011-08-05 MED ORDER — DEXTROSE 5 % IV SOLN
20.0000 mg/m2 | Freq: Once | INTRAVENOUS | Status: AC
  Administered 2011-08-05: 40 mg via INTRAVENOUS
  Filled 2011-08-05: qty 20

## 2011-08-05 MED ORDER — SODIUM CHLORIDE 0.9 % IV SOLN
Freq: Once | INTRAVENOUS | Status: AC
  Administered 2011-08-05: 8 mg via INTRAVENOUS
  Filled 2011-08-05: qty 4

## 2011-08-05 MED ORDER — SODIUM CHLORIDE 0.9 % IJ SOLN
INTRAMUSCULAR | Status: AC
  Filled 2011-08-05: qty 10

## 2011-08-05 MED ORDER — HEPARIN SOD (PORK) LOCK FLUSH 100 UNIT/ML IV SOLN
500.0000 [IU] | Freq: Once | INTRAVENOUS | Status: AC
  Administered 2011-08-05: 500 [IU] via INTRAVENOUS
  Filled 2011-08-05: qty 5

## 2011-08-05 MED ORDER — SODIUM CHLORIDE 0.9 % IV SOLN
Freq: Once | INTRAVENOUS | Status: AC
  Administered 2011-08-05: 09:00:00 via INTRAVENOUS

## 2011-08-05 MED ORDER — SODIUM CHLORIDE 0.9 % IJ SOLN
10.0000 mL | INTRAMUSCULAR | Status: DC | PRN
  Administered 2011-08-05: 10 mL
  Filled 2011-08-05: qty 10

## 2011-08-11 ENCOUNTER — Inpatient Hospital Stay (HOSPITAL_COMMUNITY): Payer: Medicare HMO

## 2011-08-12 ENCOUNTER — Inpatient Hospital Stay (HOSPITAL_COMMUNITY): Payer: Medicare HMO

## 2011-08-18 ENCOUNTER — Emergency Department (HOSPITAL_COMMUNITY): Payer: Medicare HMO

## 2011-08-18 ENCOUNTER — Inpatient Hospital Stay (HOSPITAL_COMMUNITY): Payer: Medicare HMO

## 2011-08-18 ENCOUNTER — Inpatient Hospital Stay (HOSPITAL_COMMUNITY)
Admission: EM | Admit: 2011-08-18 | Discharge: 2011-08-25 | DRG: 841 | Disposition: A | Discharge status: Home Health | Payer: Medicare HMO | Attending: Family Medicine | Admitting: Family Medicine

## 2011-08-18 ENCOUNTER — Ambulatory Visit (HOSPITAL_COMMUNITY): Payer: Medicare HMO | Admitting: Oncology

## 2011-08-18 ENCOUNTER — Encounter (HOSPITAL_COMMUNITY): Payer: Self-pay | Admitting: Emergency Medicine

## 2011-08-18 DIAGNOSIS — N19 Unspecified kidney failure: Secondary | ICD-10-CM

## 2011-08-18 DIAGNOSIS — Z9049 Acquired absence of other specified parts of digestive tract: Secondary | ICD-10-CM

## 2011-08-18 DIAGNOSIS — I959 Hypotension, unspecified: Secondary | ICD-10-CM | POA: Diagnosis present

## 2011-08-18 DIAGNOSIS — E119 Type 2 diabetes mellitus without complications: Secondary | ICD-10-CM | POA: Diagnosis present

## 2011-08-18 DIAGNOSIS — K219 Gastro-esophageal reflux disease without esophagitis: Secondary | ICD-10-CM | POA: Diagnosis present

## 2011-08-18 DIAGNOSIS — D630 Anemia in neoplastic disease: Secondary | ICD-10-CM | POA: Diagnosis present

## 2011-08-18 DIAGNOSIS — Z87891 Personal history of nicotine dependence: Secondary | ICD-10-CM

## 2011-08-18 DIAGNOSIS — I2510 Atherosclerotic heart disease of native coronary artery without angina pectoris: Secondary | ICD-10-CM | POA: Diagnosis present

## 2011-08-18 DIAGNOSIS — I10 Essential (primary) hypertension: Secondary | ICD-10-CM

## 2011-08-18 DIAGNOSIS — C9000 Multiple myeloma not having achieved remission: Principal | ICD-10-CM

## 2011-08-18 DIAGNOSIS — E785 Hyperlipidemia, unspecified: Secondary | ICD-10-CM | POA: Diagnosis present

## 2011-08-18 DIAGNOSIS — N183 Chronic kidney disease, stage 3 unspecified: Secondary | ICD-10-CM | POA: Diagnosis present

## 2011-08-18 DIAGNOSIS — Z9221 Personal history of antineoplastic chemotherapy: Secondary | ICD-10-CM

## 2011-08-18 DIAGNOSIS — N179 Acute kidney failure, unspecified: Secondary | ICD-10-CM | POA: Diagnosis present

## 2011-08-18 DIAGNOSIS — Z85038 Personal history of other malignant neoplasm of large intestine: Secondary | ICD-10-CM

## 2011-08-18 DIAGNOSIS — I129 Hypertensive chronic kidney disease with stage 1 through stage 4 chronic kidney disease, or unspecified chronic kidney disease: Secondary | ICD-10-CM | POA: Diagnosis present

## 2011-08-18 DIAGNOSIS — R600 Localized edema: Secondary | ICD-10-CM

## 2011-08-18 DIAGNOSIS — E7800 Pure hypercholesterolemia, unspecified: Secondary | ICD-10-CM | POA: Diagnosis present

## 2011-08-18 DIAGNOSIS — E875 Hyperkalemia: Secondary | ICD-10-CM

## 2011-08-18 LAB — URINALYSIS, ROUTINE W REFLEX MICROSCOPIC
Nitrite: NEGATIVE
pH: 5 (ref 5.0–8.0)

## 2011-08-18 LAB — GLUCOSE, CAPILLARY: Glucose-Capillary: 141 mg/dL — ABNORMAL HIGH (ref 70–99)

## 2011-08-18 LAB — COMPREHENSIVE METABOLIC PANEL
ALT: 9 U/L (ref 0–35)
Albumin: 3.5 g/dL (ref 3.5–5.2)
Alkaline Phosphatase: 41 U/L (ref 39–117)
BUN: 97 mg/dL — ABNORMAL HIGH (ref 6–23)
Chloride: 103 mEq/L (ref 96–112)
Glucose, Bld: 133 mg/dL — ABNORMAL HIGH (ref 70–99)
Potassium: 6.2 mEq/L — ABNORMAL HIGH (ref 3.5–5.1)
Sodium: 133 mEq/L — ABNORMAL LOW (ref 135–145)
Total Bilirubin: 0.4 mg/dL (ref 0.3–1.2)

## 2011-08-18 LAB — LACTIC ACID, PLASMA: Lactic Acid, Venous: 1.3 mmol/L (ref 0.5–2.2)

## 2011-08-18 LAB — PROTIME-INR: Prothrombin Time: 13.8 seconds (ref 11.6–15.2)

## 2011-08-18 LAB — CBC
Hemoglobin: 9.7 g/dL — ABNORMAL LOW (ref 12.0–15.0)
RBC: 3.21 MIL/uL — ABNORMAL LOW (ref 3.87–5.11)
WBC: 8 10*3/uL (ref 4.0–10.5)

## 2011-08-18 LAB — DIFFERENTIAL
Lymphs Abs: 0.4 10*3/uL — ABNORMAL LOW (ref 0.7–4.0)
Monocytes Relative: 12 % (ref 3–12)
Neutro Abs: 6.6 10*3/uL (ref 1.7–7.7)
Neutrophils Relative %: 83 % — ABNORMAL HIGH (ref 43–77)

## 2011-08-18 LAB — LIPASE, BLOOD: Lipase: 192 U/L — ABNORMAL HIGH (ref 11–59)

## 2011-08-18 MED ORDER — SODIUM CHLORIDE 0.9 % IV SOLN
INTRAVENOUS | Status: DC
  Administered 2011-08-18: 20 mL/h via INTRAVENOUS

## 2011-08-18 MED ORDER — DEXTROSE 50 % IV SOLN
25.0000 g | Freq: Once | INTRAVENOUS | Status: AC
  Administered 2011-08-18: 25 g via INTRAVENOUS

## 2011-08-18 MED ORDER — SODIUM POLYSTYRENE SULFONATE 15 GM/60ML PO SUSP
15.0000 g | Freq: Once | ORAL | Status: AC
  Administered 2011-08-18: 15 g via ORAL
  Filled 2011-08-18: qty 60

## 2011-08-18 MED ORDER — ENOXAPARIN SODIUM 40 MG/0.4ML ~~LOC~~ SOLN
40.0000 mg | SUBCUTANEOUS | Status: DC
  Administered 2011-08-18: 40 mg via SUBCUTANEOUS
  Filled 2011-08-18: qty 0.4

## 2011-08-18 MED ORDER — ASPIRIN 81 MG PO CHEW
81.0000 mg | CHEWABLE_TABLET | Freq: Every day | ORAL | Status: DC
  Administered 2011-08-19 – 2011-08-25 (×7): 81 mg via ORAL
  Filled 2011-08-18 (×7): qty 1

## 2011-08-18 MED ORDER — OXYBUTYNIN CHLORIDE 5 MG PO TABS
5.0000 mg | ORAL_TABLET | Freq: Every day | ORAL | Status: DC
  Administered 2011-08-18 – 2011-08-25 (×8): 5 mg via ORAL
  Filled 2011-08-18 (×8): qty 1

## 2011-08-18 MED ORDER — OXYCODONE-ACETAMINOPHEN 5-325 MG PO TABS: 1.0000 | ORAL_TABLET | Freq: Four times a day (QID) | ORAL | Status: DC | PRN

## 2011-08-18 MED ORDER — ACYCLOVIR 400 MG PO TABS
400.0000 mg | ORAL_TABLET | Freq: Every day | ORAL | Status: DC
  Administered 2011-08-18: 400 mg via ORAL
  Filled 2011-08-18 (×2): qty 1

## 2011-08-18 MED ORDER — DEXTROSE 50 % IV SOLN
INTRAVENOUS | Status: AC
  Filled 2011-08-18: qty 50

## 2011-08-18 MED ORDER — CALCIUM CHLORIDE 10 % IV SOLN
1.0000 g | Freq: Once | INTRAVENOUS | Status: AC
  Administered 2011-08-18: 1 g via INTRAVENOUS
  Filled 2011-08-18: qty 10

## 2011-08-18 MED ORDER — ROSUVASTATIN CALCIUM 20 MG PO TABS
20.0000 mg | ORAL_TABLET | Freq: Every day | ORAL | Status: DC
  Administered 2011-08-18 – 2011-08-24 (×7): 20 mg via ORAL
  Filled 2011-08-18 (×7): qty 1

## 2011-08-18 MED ORDER — SPIRONOLACTONE 25 MG PO TABS: 50.0000 mg | ORAL_TABLET | Freq: Every day | ORAL | Status: DC

## 2011-08-18 MED ORDER — SODIUM CHLORIDE 0.9 % IV SOLN
INTRAVENOUS | Status: DC
  Administered 2011-08-18 – 2011-08-21 (×7): via INTRAVENOUS

## 2011-08-18 MED ORDER — PANTOPRAZOLE SODIUM 40 MG PO TBEC: 40.0000 mg | DELAYED_RELEASE_TABLET | Freq: Every day | ORAL | Status: DC

## 2011-08-18 MED ORDER — FUROSEMIDE 20 MG PO TABS
20.0000 mg | ORAL_TABLET | Freq: Every day | ORAL | Status: DC
  Administered 2011-08-18: 20 mg via ORAL
  Filled 2011-08-18: qty 1

## 2011-08-18 MED ORDER — ACYCLOVIR 200 MG PO CAPS
ORAL_CAPSULE | ORAL | Status: AC
  Filled 2011-08-18: qty 2

## 2011-08-18 MED ORDER — SODIUM BICARBONATE 8.4 % IV SOLN
50.0000 meq | Freq: Once | INTRAVENOUS | Status: AC
  Administered 2011-08-18: 50 meq via INTRAVENOUS
  Filled 2011-08-18: qty 50

## 2011-08-18 MED ORDER — INSULIN ASPART 100 UNIT/ML IV SOLN
10.0000 [IU] | Freq: Once | INTRAVENOUS | Status: AC
  Administered 2011-08-18: 10 [IU] via INTRAVENOUS

## 2011-08-18 MED ORDER — PANTOPRAZOLE SODIUM 40 MG PO TBEC
40.0000 mg | DELAYED_RELEASE_TABLET | Freq: Every day | ORAL | Status: DC
  Administered 2011-08-18 – 2011-08-25 (×8): 40 mg via ORAL
  Filled 2011-08-18 (×8): qty 1

## 2011-08-18 NOTE — ED Notes (Signed)
Cardiology request repeat troponin at 6 hours, if okay patient may be discharged

## 2011-08-18 NOTE — Progress Notes (Signed)
This encounter was created in error - please disregard.

## 2011-08-18 NOTE — ED Notes (Signed)
Patient presents to ED with c/o weakness and confusion. Unable to obtain story from patient, she just states she is weak. Repeating her birthday only. Family not available at this time to obtain HPI or baseline mental status. Grips weak bilaterally, plantar/dorsiflexion weak bilaterally. No facial droop noted.

## 2011-08-18 NOTE — ED Provider Notes (Signed)
History  This chart was scribed for Diane Cooper III, MD by Cherlynn Perches. The patient was seen in room [facility]. Patient's care was started at 1536.  CSN: [id number]  Arrival date & time 08/18/11  1536   First MD Initiated Contact with Patient 08/18/11 1546      Chief Complaint  Patient presents with  . Weakness    (Consider location/radiation/quality/duration/timing/severity/associated sxs/prior treatment) HPI  [name] is a 71 y.o. female with a h/o multiple myeloma who presents to the Emergency Department complaining of two days of gradual onset, constant, moderate weakness with associated decreased appetite, confusion, abdominal pain, and foot pain. Per pt's son, the pt has been lying around the house most of the day and has fallen when trying to stand. Pt has not been eating solid food, but has been drinking water. Pt's son does not know if she has had a fever and no fever was recorded in triage vitals. Pt is currently being treated with chemotherapy for multiple myeloma. Pt denies dysuria. Pt has a h/o diabetes and hypercholesteremia. Pt is a former smoker and denies alcohol use.  PCP - Dr. Janna Arch.    Past Medical History  Diagnosis Date  . Essential hypertension, benign   . Coronary atherosclerosis of native coronary artery     Former patient of [facility] - PTCA apical LAD 2005  . Type 2 diabetes mellitus     Borderline  . GERD (gastroesophageal reflux disease)   . Hypercholesteremia   . Female stress incontinence   . UTI (urinary tract infection)   . Vertigo   . Poor sleep pattern   . Colon cancer   . Multiple myeloma     Past Surgical History  Procedure Date  . Colon surgery     Resection, [facility]  . Abdominal hysterectomy 1970s    [facility]  . Knee arthroscopy     LEFT, [facility]  . Heel spur surgery     KEELING, [facility]  . Portacath placement     [facility]   . Cervical spine surgery 2004    [facility]  . Port-a-cath removal 11/05/2010    Procedure: REMOVAL PORT-A-CATH;   Surgeon: Dalia Heading;  Location: [facility];  Service: General;  Laterality: Left;  started at 0846  . Portacath placement 11/05/2010    Procedure: INSERTION PORT-A-CATH;  Surgeon: Dalia Heading;  Location: [facility];  Service: General;  Laterality: Right;  Right subclavian, device left accessed     Family History  Problem Relation Age of Onset  . Anesthesia problems Neg Hx   . Hypotension Neg Hx   . Malignant hyperthermia Neg Hx   . Pseudochol deficiency Neg Hx   . Lung disease Mother   . Heart disease Father   . Heart disease Brother   . Hypertension Brother   . Arthritis Daughter   . Seizures Daughter   . Multiple sclerosis Son     History  Substance Use Topics  . Smoking status: Former Smoker -- 0.2 packs/day    Types: Cigarettes    Quit date: 11/03/1990  . Smokeless tobacco: Never Used  . Alcohol Use: No    OB History    Grav Para Term Preterm Abortions TAB SAB Ect Mult Living                  Review of Systems  Constitutional: Positive for activity change and appetite change. Negative for fever.  Cardiovascular: Negative for chest pain.  Gastrointestinal: Positive for abdominal pain. Negative  for nausea, vomiting and diarrhea.  Genitourinary: Negative for dysuria and frequency.  Skin: Negative for rash.  Neurological: Positive for weakness. Negative for numbness.  Psychiatric/Behavioral: Positive for confusion.  All other systems reviewed and are negative.    Allergies  Gabapentin and Simvastatin  Home Medications   Current Outpatient Rx  Name Route Sig Dispense Refill  . ACETAMINOPHEN 325 MG PO TABS Oral Take 650 mg by mouth every 4 (four) hours as needed.      . ACYCLOVIR 400 MG PO TABS Oral Take 400 mg by mouth daily.     . ASPIRIN 81 MG PO TBEC Oral Take 81 mg by mouth every morning.      . DULCOLAX PO Oral Take 2 tablets by mouth at bedtime as needed and may repeat dose one time if needed.    Marland Kitchen DEXAMETHASONE 4 MG PO TABS  Take 2 tablets by mouth daily  starting the day after chemotherapy for 2 days. Take with food. 30 tablet 1  . DEXLANSOPRAZOLE 60 MG PO CPDR Oral Take 60 mg by mouth daily.     . FUROSEMIDE 20 MG PO TABS Oral Take 1 tablet (20 mg total) by mouth daily. 30 tablet 0  . MECLIZINE HCL 12.5 MG PO TABS Oral Take 12.5 mg by mouth as needed.    . NEBIVOLOL HCL 5 MG PO TABS  Take 10 mg (whole tablet) in the am and 5 mg (1/2 tablet) in the pm 45 tablet 3  . OMEPRAZOLE 20 MG PO CPDR Oral Take 20 mg by mouth 2 (two) times daily.     Marland Kitchen ONDANSETRON HCL 8 MG PO TABS  Take 1 tablet two times a day starting the day after chemo for 2 days. Then take 1 tablet two times a day as needed for nausea or vomiting. 30 tablet 2  . OXYBUTYNIN CHLORIDE 5 MG PO TABS Oral Take 5 mg by mouth daily.     . OXYCODONE-ACETAMINOPHEN 5-325 MG PO TABS Oral Take 1 tablet by mouth every 6 (six) hours as needed.    Marland Kitchen POTASSIUM CHLORIDE CRYS ER 20 MEQ PO TBCR Oral Take 20 mEq by mouth daily.     Marland Kitchen PROCHLORPERAZINE 25 MG RE SUPP Rectal Place 1 suppository (25 mg total) rectally every 6 (six) hours as needed for nausea. 12 suppository 2  . ROSUVASTATIN CALCIUM 20 MG PO TABS Oral Take 20 mg by mouth at bedtime.      . SPIRONOLACTONE 50 MG PO TABS Oral Take 50 mg by mouth daily.     Marland Kitchen VALSARTAN 320 MG PO TABS Oral Take 1 tablet (320 mg total) by mouth daily. 30 tablet 3    Increase in dose    Triage Vitals: BP 90/62  Pulse 85  Temp(Src) 97.8 F (36.6 C) (Oral)  Resp 18  Wt 200 lb (90.719 kg)  SpO2 100%  Physical Exam  Nursing note and vitals reviewed. Constitutional: She appears well-developed and well-nourished.  HENT:  Head: Normocephalic and atraumatic.  Right Ear: External ear normal.  Left Ear: External ear normal.       Thrush on tongue  Eyes: Conjunctivae and EOM are normal. Pupils are equal, round, and reactive to light. No scleral icterus.  Neck: Normal range of motion. Neck supple.  Cardiovascular: Normal rate, regular rhythm and normal heart  sounds.  Exam reveals no gallop and no friction rub.   No murmur heard. Pulmonary/Chest: Effort normal and breath sounds normal. No respiratory distress. She has no  wheezes. She has no rales. She exhibits no tenderness.  Abdominal: Soft. Bowel sounds are normal. She exhibits no distension. There is tenderness (mild tenderness). There is no rebound.  Musculoskeletal: Normal range of motion. She exhibits no edema.       Feet warm and well perfused   Neurological: She is alert. Coordination normal.  Skin: Skin is warm and dry.  Psychiatric:       Mild confusion    ED Course  Procedures (including critical care time)  DIAGNOSTIC STUDIES: Oxygen Saturation is 100% on room air, normal by my interpretation.    COORDINATION OF CARE: 3:56PM - Will do a weakness work-up. Pt and family agree with treatment plan.  5:35 PM  Date: 08/18/2011  Rate: 83  Rhythm: normal sinus rhythm  QRS Axis: left  Intervals: normal QRS:  Left ventricular hypertrophy  ST/T Wave abnormalities: Inverted T waves in I and AVL, repolarization from LVH.  Conduction Disutrbances:none  Narrative Interpretation: Abnormal EKG  Old EKG Reviewed: unchanged   Results for orders placed during the hospital encounter of 08/18/11  CBC      Component Value Range   WBC 8.0  4.0 - 10.5 (K/uL)   RBC 3.21 (*) 3.87 - 5.11 (MIL/uL)   Hemoglobin 9.7 (*) 12.0 - 15.0 (g/dL)   HCT 91.4 (*) 78.2 - 46.0 (%)   MCV 94.4  78.0 - 100.0 (fL)   MCH 30.2  26.0 - 34.0 (pg)   MCHC 32.0  30.0 - 36.0 (g/dL)   RDW 95.6  21.3 - 08.6 (%)   Platelets 223  150 - 400 (K/uL)  DIFFERENTIAL      Component Value Range   Neutrophils Relative 83 (*) 43 - 77 (%)   Neutro Abs 6.6  1.7 - 7.7 (K/uL)   Lymphocytes Relative 5 (*) 12 - 46 (%)   Lymphs Abs 0.4 (*) 0.7 - 4.0 (K/uL)   Monocytes Relative 12  3 - 12 (%)   Monocytes Absolute 0.9  0.1 - 1.0 (K/uL)   Eosinophils Relative 1  0 - 5 (%)   Eosinophils Absolute 0.0  0.0 - 0.7 (K/uL)   Basophils  Relative 0  0 - 1 (%)   Basophils Absolute 0.0  0.0 - 0.1 (K/uL)  COMPREHENSIVE METABOLIC PANEL      Component Value Range   Sodium 133 (*) 135 - 145 (mEq/L)   Potassium 6.2 (*) 3.5 - 5.1 (mEq/L)   Chloride 103  96 - 112 (mEq/L)   CO2 11 (*) 19 - 32 (mEq/L)   Glucose, Bld 133 (*) 70 - 99 (mg/dL)   BUN 97 (*) 6 - 23 (mg/dL)   Creatinine, Ser 5.78 (*) 0.50 - 1.10 (mg/dL)   Calcium 46.9 (*) 8.4 - 10.5 (mg/dL)   Total Protein 7.7  6.0 - 8.3 (g/dL)   Albumin 3.5  3.5 - 5.2 (g/dL)   AST 13  0 - 37 (U/L)   ALT 9  0 - 35 (U/L)   Alkaline Phosphatase 41  39 - 117 (U/L)   Total Bilirubin 0.4  0.3 - 1.2 (mg/dL)   GFR calc non Af Amer 8 (*) >90 (mL/min)   GFR calc Af Amer 9 (*) >90 (mL/min)  LIPASE, BLOOD      Component Value Range   Lipase 192 (*) 11 - 59 (U/L)  PROTIME-INR      Component Value Range   Prothrombin Time 13.8  11.6 - 15.2 (seconds)   INR 1.04  0.00 - 1.49  APTT      Component Value Range   aPTT 33  24 - 37 (seconds)  LACTIC ACID, PLASMA      Component Value Range   Lactic Acid, Venous 1.3  0.5 - 2.2 (mmol/L)   Ct Head Wo Contrast  08/18/2011  *RADIOLOGY REPORT*  Clinical Data: Weakness and confusion.  Multiple myeloma.  CT HEAD WITHOUT CONTRAST  Technique:  Contiguous axial images were obtained from the base of the skull through the vertex without contrast.  Comparison: MRI 03/22/2011, CT 11/30/2010  Findings: Mild atrophy.  Ventricle size is normal.  Negative for hemorrhage or mass.  Poorly defined hypodensity in the genu internal capsule on the left.  This was present on the prior MRI and is most likely a chronic infarct.  If the patient has right-sided weakness, recent infarction could be possible.  Calvarium is intact.  Air-fluid levels in the maxillary sinus bilaterally.  IMPRESSION: 1 cm hypodensity left internal capsule, similar to the prior MRI and most likely an area of chronic ischemia.  Air-fluid levels in the sinuses.  Original Report Authenticated By: Camelia Phenes, M.D.   Dg Abd Acute W/chest  08/18/2011  *RADIOLOGY REPORT*  Clinical Data: Multiple myeloma.  Not eating.  ACUTE ABDOMEN SERIES (ABDOMEN 2 VIEW & CHEST 1 VIEW)  Comparison: 05/05/2011  Findings: Port-A-Cath tip in the SVC is unchanged.  Cardiac enlargement without heart failure.  Negative for infiltrate or effusion or mass lesion.  Normal bowel gas pattern.  Surgical clips are present in the abdomen.  Negative for bowel obstruction or free air.  No bony lesions.  No renal calculi.  IMPRESSION: No acute abnormality in the chest or abdomen.  Original Report Authenticated By: Camelia Phenes, M.D.     5:35 PM Review of Mrs. Cooper's lab shows a CBC with hematocrit of 30.3 white count of 8000 with 83% neutrophils. Chemistries showed sodium 133, potassium 6.2, high chloride 103 CO2 11 low, reflecting acidosis, BUN 97, creatinine 4.96, representing renal failure. Her calcium was high at 10.9. Her glucose was mildly elevated at 133. Lipase is elevated at 192. Liver function tests were normal. X-ray of the chest and abdomen was negative. A call was placed to Dr. Delbert Harness  5:45 PM Glucose, insulin, calcium and sodium bicarbonate ordered to treat hyperkalemia.  5:57PM - discussed admission with pt. Pt agrees with plan.  5:57 PM Case discussed with Dr. Renard Matter, covering for Dr. Janna Arch, who gave admitting orders to the nurse.   1. Multiple myeloma   2. Renal failure   3. Hyperkalemia        I personally performed the services described in this documentation, which was scribed in my presence. The recorded information has been reviewed and considered.  Osvaldo Human, M.D.   Diane Cooper III, MD 08/18/11 Windy Fast

## 2011-08-18 NOTE — H&P (Signed)
[name], [name]                 ACCOUNT NO.:  [id number]  MEDICAL RECORD NO.:  [id number]  LOCATION:  A301                          FACILITY:  [facility]  PHYSICIAN:  Jazmarie Biever G. Renard Matter, MD   DATE OF BIRTH:  [birth year]  DATE OF ADMISSION:  08/18/2011 DATE OF DISCHARGE:  LH                             HISTORY & PHYSICAL   A 71 year old female presented to the emergency department with weakness, mental confusion, decreased appetite, abdominal pain, and pain in her feet.  The patient does have apparent history of multiple myeloma.  She has not been eating any food or drinking much, but what she was drinking was mainly water.  She was seen and evaluated by ED physician and was found to have an abnormal lab values.  Hemoglobin was 9.7, hematocrit 30.3.  Chemistries showed a sodium 133, potassium 6.2, chloride 103, CO2 of 11, glucose 133, BUN 97, creatinine 4.96.  The patient's elevated potassium level was addressed in ED department.  She was given D50 with 10 units of regular insulin and also calcium chloride.  She was felt to be in renal failure with hyperkalemia.  X-ray of chest and abdomen were negative.  The patient was subsequently admitted.  SOCIAL HISTORY:  The patient was a former cigarette smoker, does not use alcohol.  FAMILY HISTORY:  Positive for multiple problems.  Mother had lung disease.  Father heart disease.  Brother hypertension.  PAST MEDICAL HISTORY:  The patient is a type 2 diabetic.  She has a history of essential hypertension, coronary artery disease, GERD, hyperlipidemia, colon cancer, and multiple myeloma.  PAST SURGICAL HISTORY:  Colon resection, abdominal hysterectomy, arthroscopic surgery on the left knee, cervical spine surgery, removal of Port-A-Cath by Dr. Lovell Sheehan in October 30, 2010.  REVIEW OF SYSTEMS:  HEENT:  Negative.  CARDIOPULMONARY:  No cough, hemoptysis, or dyspnea.  GI:  The patient has had some abdominal pain, but no nausea, vomiting, or diarrhea.   GU:  No dysuria, hematuria, or frequency.  ALLERGIES:  GABAPENTIN, SIMVASTATIN.  HOME MEDICATIONS: 1. Acetaminophen 325 mg tablets 2 tablets every 4 hours as needed. 2. Acyclovir 400 mg daily. 3. Aspirin 81 mg daily. 4. Dulcolax tablets 2 at bedtime as needed. 5. Dexamethasone 4 mg 2 tablets on the day of chemotherapy. 6. Dexlansoprazole 60 mg daily. 7. Furosemide 20 mg daily. 8. Meclizine 12.5 mg as needed. 9. Nebivolol hydrochloride 5 mg p.r.n. 10.Omeprazole 20 mg b.i.d. 11.Spironolactone 50 mg daily. 12.Valsartan 325 mg 3 daily.  PHYSICAL EXAMINATION:  GENERAL:  Alert female with blood pressure 90/62, pulse 85, temperature 97.8, respirations 18. HEENT:  Eyes PERRLA.  TM negative.  Oropharynx benign.  The patient does have erythematous tongue. NECK:  Supple.  No JVD or thyroid abnormalities. HEART:  Regular rhythm.  No murmurs.  No cardiomegaly. LUNGS:  Clear to P and A. ABDOMEN:  Tenderness in the mid abdomen.  No organomegaly. SKIN:  Warm and dry. NEUROLOGIC:  No focal deficit. EXTREMITIES:  Cranial nerves intact.  ASSESSMENT:  The patient is admitted with: 1. Multiple myeloma. 2. Hyperkalemia. 3. Elevated creatinine and evidence of renal failure.  PLAN:  To obtain consultation with Nephrology.  Continue to  monitor the patient's chemistries.  Continue her regular medications.     Corvin Sorbo G. Renard Matter, MD     AGM/MEDQ  D:  08/18/2011  T:  08/18/2011  Job:  [id number]

## 2011-08-19 ENCOUNTER — Encounter (HOSPITAL_COMMUNITY): Payer: Self-pay | Admitting: Nephrology

## 2011-08-19 LAB — BASIC METABOLIC PANEL
CO2: 17 mEq/L — ABNORMAL LOW (ref 19–32)
CO2: 18 mEq/L — ABNORMAL LOW (ref 19–32)
Calcium: 11.8 mg/dL — ABNORMAL HIGH (ref 8.4–10.5)
Calcium: 12 mg/dL — ABNORMAL HIGH (ref 8.4–10.5)
Chloride: 105 mEq/L (ref 96–112)
Chloride: 107 mEq/L (ref 96–112)
Glucose, Bld: 193 mg/dL — ABNORMAL HIGH (ref 70–99)
Potassium: 5.3 mEq/L — ABNORMAL HIGH (ref 3.5–5.1)
Potassium: 5.5 mEq/L — ABNORMAL HIGH (ref 3.5–5.1)
Sodium: 136 mEq/L (ref 135–145)
Sodium: 138 mEq/L (ref 135–145)

## 2011-08-19 LAB — MAGNESIUM: Magnesium: 2.3 mg/dL (ref 1.5–2.5)

## 2011-08-19 LAB — HEPATIC FUNCTION PANEL
AST: 15 U/L (ref 0–37)
Albumin: 3.3 g/dL — ABNORMAL LOW (ref 3.5–5.2)
Alkaline Phosphatase: 37 U/L — ABNORMAL LOW (ref 39–117)
Total Bilirubin: 0.5 mg/dL (ref 0.3–1.2)
Total Protein: 7.1 g/dL (ref 6.0–8.3)

## 2011-08-19 LAB — URINE CULTURE
Colony Count: NO GROWTH
Culture  Setup Time: 201304260152
Culture: NO GROWTH

## 2011-08-19 LAB — AMMONIA: Ammonia: 30 umol/L (ref 11–60)

## 2011-08-19 LAB — IRON AND TIBC: Iron: 109 ug/dL (ref 42–135)

## 2011-08-19 LAB — RETICULOCYTES: Retic Count, Absolute: 62.4 10*3/uL (ref 19.0–186.0)

## 2011-08-19 LAB — FERRITIN: Ferritin: 5081 ng/mL — ABNORMAL HIGH (ref 10–291)

## 2011-08-19 MED ORDER — FUROSEMIDE 10 MG/ML IJ SOLN
80.0000 mg | Freq: Two times a day (BID) | INTRAMUSCULAR | Status: DC
  Administered 2011-08-19 – 2011-08-21 (×5): 80 mg via INTRAVENOUS
  Filled 2011-08-19 (×5): qty 8

## 2011-08-19 MED ORDER — ENOXAPARIN SODIUM 30 MG/0.3ML ~~LOC~~ SOLN
30.0000 mg | SUBCUTANEOUS | Status: DC
  Administered 2011-08-19 – 2011-08-24 (×6): 30 mg via SUBCUTANEOUS
  Filled 2011-08-19 (×6): qty 0.3

## 2011-08-19 NOTE — Progress Notes (Signed)
Inpatient Diabetes Program Recommendations  AACE/ADA: New Consensus Statement on Inpatient Glycemic Control  Target Ranges:  Prepandial:   less than 140 mg/dL      Peak postprandial:   less than 180 mg/dL (1-2 hours)      Critically ill patients:  140 - 180 mg/dL  Pager:  [phone] Hours:  8 am-10pm   Reason for Visit: Elevated lab glucose : 193 mg/dL  Inpatient Diabetes Program Recommendations Correction (SSI): Add Novolog correction if glucose remains elevated HgbA1C: Check HgbA1C to assess glycemic control

## 2011-08-19 NOTE — Progress Notes (Signed)
[id number] 

## 2011-08-19 NOTE — Progress Notes (Signed)
[name], [name]                 ACCOUNT NO.:  [id number]  MEDICAL RECORD NO.:  [id number]  LOCATION:  A301                          FACILITY:  [facility]  PHYSICIAN:  Melvyn Novas, MDDATE OF BIRTH:  [birth year]  DATE OF PROCEDURE: DATE OF DISCHARGE:                                PROGRESS NOTE   The patient was admitted last night with a new onset renal failure, creatinine 4.57, compounded with hyperkalemia.  She was on 2 diuretics, Lasix 20 and spironolactone 50 b.i.d.  Also has concomitant anemia, hemoglobin 9.7, some diminished appetite, some diminished mental sensorium.  VITAL SIGNS:  She is little oriented, but hesitating in her answers, some slow mentation. VITAL SIGNS:  Blood pressure is 86/62 NEUROLOGIC:  Cranial nerves are grossly intact.  The patient moves all 4 extremities. LUNGS:  Clear with diminished breath sounds at bases.  No rales, wheeze, or rhonchi appreciable. HEART:  Regular rate rhythm.  No S3 or S4. ABDOMEN:  Essentially benign.  Potassium 5.3, BUN 90, creatinine 4.57, this is new in last several months.  Hemoglobin 9.7.  IMPRESSION: 1. Acute renal failure. 2. Hypotension. 3. Anemia, etiology possibly multifactorial and unexplained at     present. 4. History of multiple myeloma. 5. Hyperkalemia.  PLAN:  Right now is to give normal saline at 125 mL an hour, monitor renal function daily, BMET x3, magnesium, liver function panel, ammonia level.  We will get anemia profile and TSH.  Renal consult is being obtained and we will stop spironolactone due to the hyperkalemia and hypotension.     Melvyn Novas, MD     RMD/MEDQ  D:  08/19/2011  T:  08/19/2011  Job:  [id number]

## 2011-08-19 NOTE — Consult Note (Signed)
Reason for Consult: Renal failure and hyperkalemia Referring Physician: Dr. Diamantina Monks is an 71 y.o. female.  HPI: She is a patient who has history of her multiple myeloma, hypertension and diabetes presently came to the emergency room with complaints of weakness, feeling tired and poor appetite. When patient was evaluated she was found to have hyperkalemia, renal failure and hypercalcemia hence admitted to the hospital. Presently patient denies any previous history of renal failure and also history of her kidney stone her. Patient denies any nausea or vomiting but her appetite is very poor. Her main complaint seems to be weakness and feeling very tired.  Past Medical History  Diagnosis Date  . Essential hypertension, benign   . Coronary atherosclerosis of native coronary artery     Former patient of [facility] - [facility]CA apical LAD 2005  . Type 2 diabetes mellitus     Borderline  . GERD (gastroesophageal reflux disease)   . Hypercholesteremia   . Female stress incontinence   . UTI (urinary tract infection)   . Vertigo   . Poor sleep pattern   . Colon cancer   . Multiple myeloma     Past Surgical History  Procedure Date  . Colon surgery     Resection, [facility]  . Abdominal hysterectomy 1970s    [facility]  . Knee arthroscopy     LEFT, [facility]  . Heel spur surgery     KEELING, [facility]  . Portacath placement     [facility]   . Cervical spine surgery 2004    [facility]  . Port-a-cath removal 11/05/2010    Procedure: REMOVAL PORT-A-CATH;  Surgeon: Dalia Heading;  Location: [facility];  Service: General;  Laterality: Left;  started at 0846  . Portacath placement 11/05/2010    Procedure: INSERTION PORT-A-CATH;  Surgeon: Dalia Heading;  Location: [facility];  Service: General;  Laterality: Right;  Right subclavian, device left accessed     Family History  Problem Relation Age of Onset  . Anesthesia problems Neg Hx   . Hypotension Neg Hx   . Malignant hyperthermia Neg Hx   . Pseudochol deficiency Neg Hx   . Lung  disease Mother   . Heart disease Father   . Heart disease Brother   . Hypertension Brother   . Arthritis Daughter   . Seizures Daughter   . Multiple sclerosis Son     Social History:  reports that she quit smoking about 20 years ago. Her smoking use included Cigarettes. She smoked .25 packs per day. She has never used smokeless tobacco. She reports that she does not drink alcohol or use illicit drugs.  Allergies:  Allergies  Allergen Reactions  . Gabapentin Other (See Comments)    Difficulty with speech  . Simvastatin     REACTION: leg cramps    Medications: I have reviewed the patient's current medications.  Results for orders placed during the hospital encounter of 08/18/11 (from the past 48 hour(s))  CBC     Status: Abnormal   Collection Time   08/18/11  4:21 PM      Component Value Range Comment   WBC 8.0  4.0 - 10.5 (K/uL)    RBC 3.21 (*) 3.87 - 5.11 (MIL/uL)    Hemoglobin 9.7 (*) 12.0 - 15.0 (g/dL)    HCT 96.0 (*) 45.4 - 46.0 (%)    MCV 94.4  78.0 - 100.0 (fL)    MCH 30.2  26.0 - 34.0 (pg)    MCHC 32.0  30.0 - 36.0 (g/dL)    RDW 03.4  74.2 - 59.5 (%)    Platelets 223  150 - 400 (K/uL)   DIFFERENTIAL     Status: Abnormal   Collection Time   08/18/11  4:21 PM      Component Value Range Comment   Neutrophils Relative 83 (*) 43 - 77 (%)    Neutro Abs 6.6  1.7 - 7.7 (K/uL)    Lymphocytes Relative 5 (*) 12 - 46 (%)    Lymphs Abs 0.4 (*) 0.7 - 4.0 (K/uL)    Monocytes Relative 12  3 - 12 (%)    Monocytes Absolute 0.9  0.1 - 1.0 (K/uL)    Eosinophils Relative 1  0 - 5 (%)    Eosinophils Absolute 0.0  0.0 - 0.7 (K/uL)    Basophils Relative 0  0 - 1 (%)    Basophils Absolute 0.0  0.0 - 0.1 (K/uL)   COMPREHENSIVE METABOLIC PANEL     Status: Abnormal   Collection Time   08/18/11  4:21 PM      Component Value Range Comment   Sodium 133 (*) 135 - 145 (mEq/L)    Potassium 6.2 (*) 3.5 - 5.1 (mEq/L)    Chloride 103  96 - 112 (mEq/L)    CO2 11 (*) 19 - 32 (mEq/L)    Glucose,  Bld 133 (*) 70 - 99 (mg/dL)    BUN 97 (*) 6 - 23 (mg/dL)    Creatinine, Ser 6.38 (*) 0.50 - 1.10 (mg/dL)    Calcium 75.6 (*) 8.4 - 10.5 (mg/dL)    Total Protein 7.7  6.0 - 8.3 (g/dL)    Albumin 3.5  3.5 - 5.2 (g/dL)    AST 13  0 - 37 (U/L)    ALT 9  0 - 35 (U/L)    Alkaline Phosphatase 41  39 - 117 (U/L)    Total Bilirubin 0.4  0.3 - 1.2 (mg/dL)    GFR calc non Af Amer 8 (*) >90 (mL/min)    GFR calc Af Amer 9 (*) >90 (mL/min)   LIPASE, BLOOD     Status: Abnormal   Collection Time   08/18/11  4:21 PM      Component Value Range Comment   Lipase 192 (*) 11 - 59 (U/L)   PROTIME-INR     Status: Normal   Collection Time   08/18/11  4:21 PM      Component Value Range Comment   Prothrombin Time 13.8  11.6 - 15.2 (seconds)    INR 1.04  0.00 - 1.49    APTT     Status: Normal   Collection Time   08/18/11  4:21 PM      Component Value Range Comment   aPTT 33  24 - 37 (seconds)   LACTIC ACID, PLASMA     Status: Normal   Collection Time   08/18/11  4:26 PM      Component Value Range Comment   Lactic Acid, Venous 1.3  0.5 - 2.2 (mmol/L)   PROCALCITONIN     Status: Normal   Collection Time   08/18/11  4:26 PM      Component Value Range Comment   Procalcitonin 0.26     URINALYSIS, ROUTINE W REFLEX MICROSCOPIC     Status: Abnormal   Collection Time   08/18/11  5:46 PM      Component Value Range Comment   Color, Urine YELLOW  YELLOW  APPearance CLOUDY (*) CLEAR     Specific Gravity, Urine 1.025  1.005 - 1.030     pH 5.0  5.0 - 8.0     Glucose, UA NEGATIVE  NEGATIVE (mg/dL)    Hgb urine dipstick MODERATE (*) NEGATIVE     Bilirubin Urine MODERATE (*) NEGATIVE     Ketones, ur TRACE (*) NEGATIVE (mg/dL)    Protein, ur 30 (*) NEGATIVE (mg/dL)    Urobilinogen, UA 0.2  0.0 - 1.0 (mg/dL)    Nitrite NEGATIVE  NEGATIVE     Leukocytes, UA NEGATIVE  NEGATIVE    URINE MICROSCOPIC-ADD ON     Status: Abnormal   Collection Time   08/18/11  5:46 PM      Component Value Range Comment   Squamous  Epithelial / LPF FEW (*) RARE     WBC, UA 0-2  <3 (WBC/hpf)    RBC / HPF 3-6  <3 (RBC/hpf)    Bacteria, UA MANY (*) RARE    GLUCOSE, CAPILLARY     Status: Abnormal   Collection Time   08/18/11  8:35 PM      Component Value Range Comment   Glucose-Capillary 141 (*) 70 - 99 (mg/dL)   BASIC METABOLIC PANEL     Status: Abnormal   Collection Time   08/18/11 11:50 PM      Component Value Range Comment   Sodium 136  135 - 145 (mEq/L)    Potassium 5.5 (*) 3.5 - 5.1 (mEq/L)    Chloride 105  96 - 112 (mEq/L)    CO2 17 (*) 19 - 32 (mEq/L)    Glucose, Bld 193 (*) 70 - 99 (mg/dL)    BUN 92 (*) 6 - 23 (mg/dL)    Creatinine, Ser 4.09 (*) 0.50 - 1.10 (mg/dL)    Calcium 81.1 (*) 8.4 - 10.5 (mg/dL)    GFR calc non Af Amer 9 (*) >90 (mL/min)    GFR calc Af Amer 10 (*) >90 (mL/min)   BASIC METABOLIC PANEL     Status: Abnormal   Collection Time   08/19/11  5:08 AM      Component Value Range Comment   Sodium 138  135 - 145 (mEq/L)    Potassium 5.3 (*) 3.5 - 5.1 (mEq/L)    Chloride 107  96 - 112 (mEq/L)    CO2 18 (*) 19 - 32 (mEq/L)    Glucose, Bld 147 (*) 70 - 99 (mg/dL)    BUN 90 (*) 6 - 23 (mg/dL)    Creatinine, Ser 9.14 (*) 0.50 - 1.10 (mg/dL)    Calcium 78.2 (*) 8.4 - 10.5 (mg/dL)    GFR calc non Af Amer 9 (*) >90 (mL/min)    GFR calc Af Amer 10 (*) >90 (mL/min)     Ct Head Wo Contrast  08/18/2011  *RADIOLOGY REPORT*  Clinical Data: Weakness and confusion.  Multiple myeloma.  CT HEAD WITHOUT CONTRAST  Technique:  Contiguous axial images were obtained from the base of the skull through the vertex without contrast.  Comparison: MRI 03/22/2011, CT 11/30/2010  Findings: Mild atrophy.  Ventricle size is normal.  Negative for hemorrhage or mass.  Poorly defined hypodensity in the genu internal capsule on the left.  This was present on the prior MRI and is most likely a chronic infarct.  If the patient has right-sided weakness, recent infarction could be possible.  Calvarium is intact.  Air-fluid levels  in the maxillary sinus bilaterally.  IMPRESSION: 1 cm hypodensity left  internal capsule, similar to the prior MRI and most likely an area of chronic ischemia.  Air-fluid levels in the sinuses.  Original Report Authenticated By: Camelia Phenes, M.D.   Dg Abd Acute W/chest  08/18/2011  *RADIOLOGY REPORT*  Clinical Data: Multiple myeloma.  Not eating.  ACUTE ABDOMEN SERIES (ABDOMEN 2 VIEW & CHEST 1 VIEW)  Comparison: 05/05/2011  Findings: Port-A-Cath tip in the SVC is unchanged.  Cardiac enlargement without heart failure.  Negative for infiltrate or effusion or mass lesion.  Normal bowel gas pattern.  Surgical clips are present in the abdomen.  Negative for bowel obstruction or free air.  No bony lesions.  No renal calculi.  IMPRESSION: No acute abnormality in the chest or abdomen.  Original Report Authenticated By: Camelia Phenes, M.D.    Review of Systems  Constitutional: Negative for fever.  Respiratory: Negative for shortness of breath.   Cardiovascular: Negative for orthopnea and leg swelling.  Gastrointestinal: Positive for nausea. Negative for vomiting and diarrhea.  Genitourinary:       Decrease  in  urine output  Musculoskeletal: Positive for back pain.  Neurological: Positive for dizziness and weakness.   Blood pressure 86/62, pulse 98, temperature 97.6 F (36.4 C), temperature source Oral, resp. rate 18, height 5\' 5"  (1.651 m), weight 83.5 kg (184 lb 1.4 oz), SpO2 98.00%. Physical Exam  Constitutional: She is oriented to person, place, and time.  Eyes: No scleral icterus.  Neck: Neck supple. No JVD present.  Cardiovascular: Normal rate and regular rhythm.   No murmur heard. Respiratory: No respiratory distress. She has no wheezes. She has no rales.  GI: She exhibits no distension. There is no tenderness. There is no rebound.  Musculoskeletal: She exhibits no edema.  Neurological: She is alert and oriented to person, place, and time.  Skin: Skin is dry. No erythema.     Assessment/Plan: Problem #1 acute kidney injury patient creatinine in February was 1.2 hence the present increasing in BUN and creatinine seems to be new. The etiology as this moment seems to be multifactorial including prerenal syndrome/hypercalcimia/acyclovir/valsartan. Since patient has multiple myeloma with/ lambda chain underlying chronic disease such as amyloidosis in light chain deposition disease cannot ruled out. Other possibility because of the acute nature  of her renal failure myeloma kidney also need to in the differential diagnosis. Problem #2 hyperkalemia this her could be also a combination of potassium supplement, valsartan and Aldactone. Problem #3 hypercalcemia is most likely secondary to her multiple myeloma and secondary to calcium with vitamin D. Problem #4 history of hypertension blood pressure as this moment seems to be low. Problem #5 history of diabetes Problem #6 history of GERD Problem #7 history of multiple myeloma Problem #8 history of anemia. Plan: I agree with discontinuation of potassium supplement, Aldactone and valsartan.          We'll continue to hydrate patient          We'll use Lasix to improve her urine output.          We'll check 24-hour urine for protein and immunoelectrophoresis.          We'll check her intact PTH, 25 vitamin D level.          We'll follow her input and output and also daily labs.  Velera Lansdale S 08/19/2011, 7:19 AM

## 2011-08-19 NOTE — Progress Notes (Signed)
   CARE MANAGEMENT NOTE 08/19/2011  Patient:  [name], [name]   Account Number:  [id number]  Date Initiated:  08/19/2011  Documentation initiated by:  Anibal Henderson  Subjective/Objective Assessment:   Admitted with UTI and RF pt lives at home with brother, and other family members assist with care. She and family members request aide at home     Action/Plan:   Explained they should apply for medicaid at social services, to see if pt qualifies for  medicaid aide services. Meanwhile, HH can be set up for the short term, and they agree with this plan   Anticipated DC Date:  08/21/2011   Anticipated DC Plan:  HOME W HOME HEALTH SERVICES      DC Planning Services  CM consult      [facility] Choice  HOME HEALTH   Choice offered to / List presented to:  C-1 Patient        HH arranged  HH-1 RN  HH-4 NURSE'S AIDE  HH-2 PT  HH-6 SOCIAL WORKER      HH agency  [facility]   Status of service:  In process, will continue to follow Medicare Important Message given?   (If response is "NO", the following Medicare IM given date fields will be blank) Date Medicare IM given:   Date Additional Medicare IM given:    Discharge Disposition:    Per UR Regulation:    If discussed at Long Length of Stay Meetings, dates discussed:    Comments:  08/19/11 1530 Anibal Henderson RN

## 2011-08-20 LAB — HEPATIC FUNCTION PANEL
AST: 10 U/L (ref 0–37)
Alkaline Phosphatase: 31 U/L — ABNORMAL LOW (ref 39–117)
Bilirubin, Direct: 0.1 mg/dL (ref 0.0–0.3)
Total Bilirubin: 0.4 mg/dL (ref 0.3–1.2)

## 2011-08-20 LAB — BASIC METABOLIC PANEL
BUN: 71 mg/dL — ABNORMAL HIGH (ref 6–23)
CO2: 18 mEq/L — ABNORMAL LOW (ref 19–32)
GFR calc non Af Amer: 11 mL/min — ABNORMAL LOW (ref >90)
Glucose, Bld: 108 mg/dL — ABNORMAL HIGH (ref 70–99)
Potassium: 3.8 mEq/L (ref 3.5–5.1)

## 2011-08-20 LAB — PROTEIN, URINE, 24 HOUR: Protein, Urine: 4 mg/dL

## 2011-08-20 LAB — VITAMIN D 25 HYDROXY (VIT D DEFICIENCY, FRACTURES): Vit D, 25-Hydroxy: 19 ng/mL — ABNORMAL LOW (ref 30–89)

## 2011-08-20 LAB — TSH: TSH: 0.638 u[IU]/mL (ref 0.350–4.500)

## 2011-08-20 MED ORDER — SODIUM CHLORIDE 0.9 % IJ SOLN
INTRAMUSCULAR | Status: AC
  Filled 2011-08-20: qty 3

## 2011-08-20 NOTE — Progress Notes (Signed)
[id number] 

## 2011-08-20 NOTE — Progress Notes (Signed)
NAMEGUILLERMINA, SHAFT                 ACCOUNT NO.:  [id number]  MEDICAL RECORD NO.:  [id number]  LOCATION:  A301                          FACILITY:  [facility]  PHYSICIAN:  Melvyn Novas, MDDATE OF BIRTH:  01/18/1941  DATE OF PROCEDURE:  08/20/2011 DATE OF DISCHARGE:                                PROGRESS NOTE   SUBJECTIVE:  The patient has acute renal failure since several months ago, creatinine 4.57; hyperkalemia, potassium 5.3 as of yesterday; anemia of chronic disease, receiving erythropoietin products every several weeks, and hematology with multiple myeloma.  Some cognitive dysfunction, possibly due to uremia, little worse over baseline, some slow mentation.  OBJECTIVE:  VITAL SIGNS:  Blood pressure is 99/63, temperature is 98.5, pulse is 70 and regular, O2 saturation 98%.  Awaiting this morning's BMET.  Renal is on consult. NECK:  No JVD.  No carotid bruits. LUNGS:  Diminished breath sounds at bases.  No rales, wheeze, or rhonchi appreciable. HEART:  Regular rhythm.  No S3, S4.  PLAN:  Right now is to continue aggressive fluid repletion, monitor electrolytes according to Nephrology, and ambulate the patient.     Melvyn Novas, MD     RMD/MEDQ  D:  08/20/2011  T:  08/20/2011  Job:  [id number]

## 2011-08-20 NOTE — Progress Notes (Signed)
Subjective: Interval History: has no complaint of nausea or vomiting. She denies any difficulty in breathing. Patient is still her she is feeling better but still somewhat weak..  Objective: Vital signs in last 24 hours: Temp:  [97.8 F (36.6 C)-98.5 F (36.9 C)] 98.5 F (36.9 C) (04/27 0535) Pulse Rate:  [70-77] 70  (04/27 0535) Resp:  [18] 18  (04/27 0535) BP: (84-116)/(57-75) 99/63 mmHg (04/27 0535) SpO2:  [97 %-100 %] 98 % (04/27 0535) Weight change:   Intake/Output from previous day: 04/26 0701 - 04/27 0700 In: 5922.5 [P.O.:360; I.V.:5562.5] Out: 1750 [Urine:1750] Intake/Output this shift:    General appearance: alert, cooperative and no distress Resp: clear to auscultation bilaterally Cardio: regular rate and rhythm, S1, S2 normal, no murmur, click, rub or gallop Extremities: extremities normal, atraumatic, no cyanosis or edema  Lab Results:  [facility] 08/18/11 1621  WBC 8.0  HGB 9.7*  HCT 30.3*  PLT 223   BMET:  Basename 08/20/11 0611 08/19/11 0508  NA 140 138  K 3.8 5.3*  CL 112 107  CO2 18* 18*  GLUCOSE 108* 147*  BUN 71* 90*  CREATININE 3.77* 4.57*  CALCIUM 9.3 11.8*   No results found for this basename: PTH:2 in the last 72 hours Iron Studies:  Basename 08/19/11 0715  IRON 109  TIBC 217*  TRANSFERRIN --  FERRITIN 5081*    Studies/Results: Ct Head Wo Contrast  08/18/2011  *RADIOLOGY REPORT*  Clinical Data: Weakness and confusion.  Multiple myeloma.  CT HEAD WITHOUT CONTRAST  Technique:  Contiguous axial images were obtained from the base of the skull through the vertex without contrast.  Comparison: MRI 03/22/2011, CT 11/30/2010  Findings: Mild atrophy.  Ventricle size is normal.  Negative for hemorrhage or mass.  Poorly defined hypodensity in the genu internal capsule on the left.  This was present on the prior MRI and is most likely a chronic infarct.  If the patient has right-sided weakness, recent infarction could be possible.  Calvarium is  intact.  Air-fluid levels in the maxillary sinus bilaterally.  IMPRESSION: 1 cm hypodensity left internal capsule, similar to the prior MRI and most likely an area of chronic ischemia.  Air-fluid levels in the sinuses.  Original Report Authenticated By: Camelia Phenes, M.D.   Dg Abd Acute W/chest  08/18/2011  *RADIOLOGY REPORT*  Clinical Data: Multiple myeloma.  Not eating.  ACUTE ABDOMEN SERIES (ABDOMEN 2 VIEW & CHEST 1 VIEW)  Comparison: 05/05/2011  Findings: Port-A-Cath tip in the SVC is unchanged.  Cardiac enlargement without heart failure.  Negative for infiltrate or effusion or mass lesion.  Normal bowel gas pattern.  Surgical clips are present in the abdomen.  Negative for bowel obstruction or free air.  No bony lesions.  No renal calculi.  IMPRESSION: No acute abnormality in the chest or abdomen.  Original Report Authenticated By: Camelia Phenes, M.D.    I have reviewed the patient's current medications.  Assessment/Plan: Problem #1 renal failure most likely acute on chronic her BUN and creatinine is improving . Presently she is none oliguric.  Problem #2 hyperkalemia potassium has corrected Problem #3 history of hypercalcemia calcium is 9.3 has improved. Corrected for albumin of her 2.9 calcium still seems to be high. Problem #4 anemia at this moment seems to be secondary to chronic disease such as her multiple myeloma. Her ferritin is high so also iron saturation however her iron saturation may be distorted because of low iron-binding capacity. Problem #5 history of multiple myeloma Problem #  6 history of hyperlipidemia Problem #7 coronary artery disease she is a symptomatic We will continue with present treatment We'll check her basic metabolic panel in the morning and continue his hydration.   LOS: 2 days   Xavier Fournier S 08/20/2011,8:24 AM

## 2011-08-21 LAB — HEPATIC FUNCTION PANEL
ALT: 6 U/L (ref 0–35)
Alkaline Phosphatase: 32 U/L — ABNORMAL LOW (ref 39–117)
Bilirubin, Direct: 0.1 mg/dL (ref 0.0–0.3)
Indirect Bilirubin: 0.3 mg/dL (ref 0.3–0.9)
Total Bilirubin: 0.4 mg/dL (ref 0.3–1.2)

## 2011-08-21 LAB — BASIC METABOLIC PANEL
BUN: 53 mg/dL — ABNORMAL HIGH (ref 6–23)
CO2: 20 mEq/L (ref 19–32)
Calcium: 9 mg/dL (ref 8.4–10.5)
Creatinine, Ser: 3.16 mg/dL — ABNORMAL HIGH (ref 0.50–1.10)
Glucose, Bld: 115 mg/dL — ABNORMAL HIGH (ref 70–99)

## 2011-08-21 MED ORDER — FUROSEMIDE 10 MG/ML IJ SOLN
40.0000 mg | Freq: Two times a day (BID) | INTRAMUSCULAR | Status: DC
  Administered 2011-08-21: 40 mg via INTRAVENOUS
  Filled 2011-08-21 (×2): qty 4

## 2011-08-21 MED ORDER — SODIUM CHLORIDE 0.9 % IJ SOLN
INTRAMUSCULAR | Status: AC
  Filled 2011-08-21: qty 3

## 2011-08-21 MED ORDER — NYSTATIN 100000 UNIT/GM EX CREA
TOPICAL_CREAM | Freq: Two times a day (BID) | CUTANEOUS | Status: DC
  Administered 2011-08-21 – 2011-08-23 (×5): via TOPICAL
  Administered 2011-08-23: 1 via TOPICAL
  Administered 2011-08-24 – 2011-08-25 (×3): via TOPICAL
  Filled 2011-08-21 (×2): qty 15

## 2011-08-21 MED ORDER — KCL IN DEXTROSE-NACL 40-5-0.45 MEQ/L-%-% IV SOLN
INTRAVENOUS | Status: DC
  Administered 2011-08-21 – 2011-08-22 (×3): via INTRAVENOUS

## 2011-08-21 MED ORDER — POTASSIUM CHLORIDE CRYS ER 20 MEQ PO TBCR
20.0000 meq | EXTENDED_RELEASE_TABLET | Freq: Two times a day (BID) | ORAL | Status: DC
  Administered 2011-08-21 (×2): 20 meq via ORAL
  Filled 2011-08-21 (×2): qty 1

## 2011-08-21 NOTE — Progress Notes (Signed)
Subjective: Interval History: has complaints poor appetite and nausea. Patient denies any vomiting and she doesn't have any difficulty breathing. Patient says that overall she is feeling better..  Objective: Vital signs in last 24 hours: Temp:  [98.1 F (36.7 C)-98.2 F (36.8 C)] 98.1 F (36.7 C) (04/28 0503) Pulse Rate:  [69-84] 84  (04/28 0503) Resp:  [18-20] 18  (04/28 0503) BP: (96-107)/(67-71) 96/67 mmHg (04/28 0503) SpO2:  [94 %-98 %] 96 % (04/28 0503) Weight change:   Intake/Output from previous day: 04/27 0701 - 04/28 0700 In: 3663.7 [P.O.:600; I.V.:3063.7] Out: 4300 [Urine:4300] Intake/Output this shift:    General appearance: alert, cooperative and no distress Resp: clear to auscultation bilaterally Cardio: regular rate and rhythm, S1, S2 normal, no murmur, click, rub or gallop GI: soft, non-tender; bowel sounds normal; no masses,  no organomegaly Extremities: extremities normal, atraumatic, no cyanosis or edema  Lab Results:  Basename 08/18/11 1621  WBC 8.0  HGB 9.7*  HCT 30.3*  PLT 223   BMET:  Basename 08/21/11 0645 08/20/11 0611  NA 138 140  K 3.2* 3.8  CL 107 112  CO2 20 18*  GLUCOSE 115* 108*  BUN 53* 71*  CREATININE 3.16* 3.77*  CALCIUM 9.0 9.3   No results found for this basename: PTH:2 in the last 72 hours Iron Studies:  Basename 08/19/11 0715  IRON 109  TIBC 217*  TRANSFERRIN --  FERRITIN 5081*    Studies/Results: No results found.  I have reviewed the patient's current medications.  Assessment/Plan: Problem #1 renal failure acute kidney injury superimposed on chronic her BUN is 53 creatinine is 3.16 progressively improving. Her urine output is good and patient doesn't have any sign of fluid overload. Problem #2 hypokalemia possibly secondary to diuretics her potassium is 3.2 Problem #3 hypercalcemia calcium has improved and presently is 9. Problem #4 anemia this seems to be anemia of chronic disease. Problem #5 history of multiple  myeloma Problem #6 history of hypertension her blood pressure seems to be reasonably controlled Problem #7 proteinuria non-nephrotic range. 24-hour protein immunoelectrophoresis is pending. 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 Problem #8 poor appetite. Etiology as this moment is not clear. If this is related to her renal failure it may improve when her renal function progressively gets better. Plan: We'll change her IV fluid to half-normal saline with KCl 40 mEq at present rate           Will decrease Lasix to 40 mg IV twice a day           We'll continue to follow her blood work.    LOS: 3 days   Pieter Fooks S 08/21/2011,8:10 AM

## 2011-08-21 NOTE — Progress Notes (Signed)
[id number] 

## 2011-08-21 NOTE — Progress Notes (Signed)
[name], [name] NO.:  [id number]  MEDICAL RECORD NO.:  [id number]  LOCATION:  A301                          FACILITY:  [facility]  PHYSICIAN:  Melvyn Novas, MDDATE OF BIRTH:  Oct 12, 1940  DATE OF PROCEDURE: DATE OF DISCHARGE:                                PROGRESS NOTE   The patient has acute renal failure.  Creatinine dropped with aggressive fluids and diuretics from 4.5 to 3.16.  Potassium also dropped to 3.2. We will add K-Dur 20 mEq p.o. daily.  VITAL SIGNS:  Blood pressure 96/67, temperature 98.1, pulse is 84 and regular, and respiratory rate is 18. NECK:  No JVD.  No carotid bruits.  No thyromegaly.  No thyroid bruits. LUNGS:  Clear to A and P.  No rales, wheeze, or rhonchi appreciable. HEART:  Regular rate and rhythm.  No S3, S4, or gallops appreciable.  Albumin down to 3.0.  Iron within normal limits.  Ferritin elevated. Acute phase reactant and folate within normal limits.  She has anemia of chronic disease, presumably due to fact multifactorial issues, multiple myeloma.  Plan right now is to continue aggressive fluids and diuretics, monitor, electrolytes, renal function, and anemia.  TSH is 0.63, within normal limits.     Melvyn Novas, MD     RMD/MEDQ  D:  08/21/2011  T:  08/21/2011  Job:  [id number]

## 2011-08-22 ENCOUNTER — Inpatient Hospital Stay (HOSPITAL_COMMUNITY): Payer: Medicare HMO

## 2011-08-22 LAB — CBC
HCT: 24.6 % — ABNORMAL LOW (ref 36.0–46.0)
MCV: 92.8 fL (ref 78.0–100.0)
Platelets: 210 10*3/uL (ref 150–400)
RBC: 2.65 MIL/uL — ABNORMAL LOW (ref 3.87–5.11)
RDW: 14.3 % (ref 11.5–15.5)
WBC: 5.4 10*3/uL (ref 4.0–10.5)

## 2011-08-22 LAB — BASIC METABOLIC PANEL
Calcium: 8.9 mg/dL (ref 8.4–10.5)
Creatinine, Ser: 2.63 mg/dL — ABNORMAL HIGH (ref 0.50–1.10)
GFR calc non Af Amer: 17 mL/min — ABNORMAL LOW (ref >90)
Sodium: 136 mEq/L (ref 135–145)

## 2011-08-22 LAB — PTH, INTACT AND CALCIUM
Calcium, Total (PTH): 10.4 mg/dL (ref 8.4–10.5)
PTH: 51.8 pg/mL (ref 14.0–72.0)

## 2011-08-22 LAB — HEPATIC FUNCTION PANEL
Albumin: 3 g/dL — ABNORMAL LOW (ref 3.5–5.2)
Total Protein: 6.5 g/dL (ref 6.0–8.3)

## 2011-08-22 MED ORDER — SODIUM CHLORIDE 0.9 % IV SOLN
INTRAVENOUS | Status: DC
  Administered 2011-08-22 – 2011-08-24 (×3): via INTRAVENOUS
  Administered 2011-08-24: 1000 mL via INTRAVENOUS
  Administered 2011-08-25: 07:00:00 via INTRAVENOUS

## 2011-08-22 MED ORDER — SODIUM CHLORIDE 0.9 % IJ SOLN
INTRAMUSCULAR | Status: AC
  Filled 2011-08-22: qty 3

## 2011-08-22 NOTE — Progress Notes (Signed)
NAMEMAREE, AINLEY NO.:  [id number]  MEDICAL RECORD NO.:  [id number]  LOCATION:  A301                          FACILITY:  [facility]  PHYSICIAN:  Melvyn Novas, MDDATE OF BIRTH:  1940-12-31  DATE OF PROCEDURE: DATE OF DISCHARGE:                                PROGRESS NOTE   The patient has chronic multiple myeloma, chronic anemia of chronic disease, acute renal failure.  Creatinine was 4 now down to 2.63 with aggressive hydration and diuresis.  Hemoglobin 8.1.  The patient has hyperlipidemia also.  Blood pressure is 90/60, temperature 98.1, pulse is 88 and regular, respiratory rate is 18.  WBC is 5.4, potassium 4.1, and creatinine 2.63.  Neck shows no JVD.  Lungs clear to A and P.  No rales, wheeze, or rhonchi appreciable.  Some diminished breath sounds at the bases.  Heart, regular rhythm.  No murmurs, gallops, heaves, thrills, or rubs.  The patient's renal function improving with hydration and diuresis continue plan of care per Nephrology, and we will monitor her hemoglobin, hematocrit, and renal function in a.m.     Melvyn Novas, MD     RMD/MEDQ  D:  08/22/2011  T:  08/22/2011  Job:  [id number]

## 2011-08-22 NOTE — Evaluation (Signed)
Physical Therapy Evaluation Patient Details Name: [name] MRN: [id number] DOB: 18-Feb-1941 Today's Date: 08/22/2011 Time: 2841-3244 PT Time Calculation (min): 46 min  PT Assessment / Plan / Recommendation Clinical Impression  Pt is very pleasant and cooperative with significant weakness in both LEs and very deconditioned.  She needs assist for bed mobility and transfers, only able to ambulate 5' with a walker.  She was exhausted from this.  She reports having very sensitive feet and pain with weight bearing, however she did not report any pain to me.  I am recommending SNF at d/c.      PT Assessment  Patient needs continued PT services    Follow Up Recommendations  Home health PT;Supervision/Assistance - 24 hour;Skilled nursing facility (family/pt to decide)    Equipment Recommendations  Defer to next venue    Frequency Min 5X/week    Precautions / Restrictions Precautions Precautions: Fall Restrictions Weight Bearing Restrictions: No   Pertinent Vitals/Pain       Mobility  Bed Mobility Bed Mobility: Supine to Sit Supine to Sit: 3: Mod assist Details for Bed Mobility Assistance: pt unable to move LEs in the bed Transfers Transfers: Sit to Stand;Stand to Sit Sit to Stand: 4: Min assist Stand to Sit: 4: Min guard Ambulation/Gait Ambulation/Gait Assistance: 4: Min assist Ambulation Distance (Feet): 5 Feet Assistive device: Rolling walker Gait Pattern: Shuffle;Trunk flexed;Step-through pattern Gait velocity: very slow and labored Stairs: No Wheelchair Mobility Wheelchair Mobility: No    Exercises     PT Goals Acute Rehab PT Goals PT Goal Formulation: With patient/family Time For Goal Achievement: 09/05/11 Potential to Achieve Goals: Fair Pt will go Supine/Side to Sit: with min assist;with HOB not 0 degrees (comment degree) PT Goal: Supine/Side to Sit - Progress: Goal set today Pt will go Sit to Supine/Side: with min assist PT Goal: Sit to Supine/Side -  Progress: Goal set today Pt will go Sit to Stand: with supervision PT Goal: Sit to Stand - Progress: Goal set today Pt will go Stand to Sit: with supervision PT Goal: Stand to Sit - Progress: Goal set today Pt will Ambulate: 1 - 15 feet;with supervision;with rolling walker PT Goal: Ambulate - Progress: Goal set today  Visit Information  Last PT Received On: 08/22/11    Subjective Data  Subjective: I am pretty weak Patient Stated Goal: return home to independence   Prior Functioning  Home Living Lives With: Family Available Help at Discharge: Family Type of Home: House Home Access: Ramped entrance Home Layout: One level Bathroom Shower/Tub: Engineer, manufacturing systems: Standard Home Adaptive Equipment: Hospital bed;Walker - rolling;Straight cane Prior Function Level of Independence: Independent Able to Take Stairs?: No Driving: No Vocation: Retired Musician: No difficulties    Cognition  Overall Cognitive Status: Appears within functional limits for tasks assessed/performed Arousal/Alertness: Awake/alert Orientation Level: Appears intact for tasks assessed Behavior During Session: [facility] for tasks performed    Extremity/Trunk Assessment Right Upper Extremity Assessment RUE ROM/Strength/Tone: WFL for tasks assessed RUE Sensation: WFL - Light Touch;WFL - Proprioception RUE Coordination: WFL - gross motor Left Upper Extremity Assessment LUE ROM/Strength/Tone: WFL for tasks assessed LUE Sensation: WFL - Light Touch;WFL - Proprioception LUE Coordination: WFL - gross motor Right Lower Extremity Assessment RLE ROM/Strength/Tone: Deficits RLE ROM/Strength/Tone Deficits: strength  generally 3-/5 RLE Sensation: History of peripheral neuropathy Left Lower Extremity Assessment LLE ROM/Strength/Tone: Deficits LLE ROM/Strength/Tone Deficits: strength generally 3-/5 LLE Sensation: History of peripheral neuropathy LLE Coordination: WFL - gross motor Trunk  Assessment Trunk  Assessment: Normal   Balance Balance Balance Assessed: No  End of Session PT - End of Session Equipment Utilized During Treatment: Gait belt Activity Tolerance: Patient tolerated treatment well;Patient limited by fatigue Patient left: in chair;with call bell/phone within reach;with family/visitor present   Konrad Penta 08/22/2011, 2:29 PM

## 2011-08-22 NOTE — Progress Notes (Signed)
Subjective: Interval History: has no complaint of nausea or vomiting. Her appetite is still remains poor. Patient denies any difficulty breathing, no orthopnea or paroxysmal nocturnal dyspnea. Patient also denies any  chest pain .  Objective: Vital signs in last 24 hours: Temp:  [98 F (36.7 C)-98.4 F (36.9 C)] 98.1 F (36.7 C) (04/29 1610) Pulse Rate:  [76-80] 78  (04/29 0632) Resp:  [18-20] 18  (04/29 9604) BP: (94-112)/(70-72) 107/72 mmHg (04/29 0632) SpO2:  [97 %-98 %] 97 % (04/29 5409) Weight change:   Intake/Output from previous day: 04/28 0701 - 04/29 0700 In: 4302.1 [P.O.:600; I.V.:3702.1] Out: 3100 [Urine:3100] Intake/Output this shift:    General appearance: alert, cooperative and no distress Resp: clear to auscultation bilaterally Cardio: regular rate and rhythm, S1, S2 normal, no murmur, click, rub or gallop GI: soft, non-tender; bowel sounds normal; no masses,  no organomegaly Extremities: extremities normal, atraumatic, no cyanosis or edema  Lab Results:  [facility] 08/22/11 0544  WBC 5.4  HGB 8.1*  HCT 24.6*  PLT 210   BMET:  Basename 08/22/11 0544 08/21/11 0645  NA 136 138  K 4.1 3.2*  CL 105 107  CO2 21 20  GLUCOSE 176* 115*  BUN 37* 53*  CREATININE 2.63* 3.16*  CALCIUM 8.9 9.0   No results found for this basename: PTH:2 in the last 72 hours Iron Studies: No results found for this basename: IRON,TIBC,TRANSFERRIN,FERRITIN in the last 72 hours  Studies/Results: No results found.  I have reviewed the patient's current medications.  Assessment/Plan: Problem #1 renal failure BUN is  37 creatinine is 2.63 progressively improving. Problem #2 hypokalemia potassium 4.1 has improved Problem #3 history of hypercalcemia calcium 8.9 that has also corrected. Problem #4 history of coronary artery disease she is a symptomatic Problem #5 history of hypertension blood pressure seems to be controlled very well Problem #6 history of  hypercholesterolemia Problem #7 history of anemia seems to be anemia of chronic disease. Presently the patient is being followed at the oncology clinic and the possibly she may be  on Epogen and. If not she may need to be started. Problem #8 multiple myeloma Plan: Change IV fluid to normal saline at 75 cc per hour           Will DC Lasix           We'll follow patient once she is discharged.            Basic metabolic panel in the morning.   LOS: 4 days   Chassie Pennix S 08/22/2011,7:37 AM

## 2011-08-22 NOTE — Progress Notes (Signed)
[id number] 

## 2011-08-23 ENCOUNTER — Inpatient Hospital Stay (HOSPITAL_COMMUNITY): Payer: Medicare HMO

## 2011-08-23 LAB — CULTURE, BLOOD (ROUTINE X 2)
Culture: NO GROWTH
Culture: NO GROWTH

## 2011-08-23 LAB — CBC
Hemoglobin: 7.2 g/dL — ABNORMAL LOW (ref 12.0–15.0)
MCH: 30.8 pg (ref 26.0–34.0)
Platelets: 200 10*3/uL (ref 150–400)
RBC: 2.34 MIL/uL — ABNORMAL LOW (ref 3.87–5.11)
WBC: 4.6 10*3/uL (ref 4.0–10.5)

## 2011-08-23 LAB — BASIC METABOLIC PANEL
CO2: 21 mEq/L (ref 19–32)
Calcium: 8.1 mg/dL — ABNORMAL LOW (ref 8.4–10.5)
GFR calc non Af Amer: 21 mL/min — ABNORMAL LOW (ref >90)
Glucose, Bld: 103 mg/dL — ABNORMAL HIGH (ref 70–99)
Potassium: 3.9 mEq/L (ref 3.5–5.1)
Sodium: 140 mEq/L (ref 135–145)

## 2011-08-23 MED ORDER — DARBEPOETIN ALFA-POLYSORBATE 40 MCG/0.4ML IJ SOLN: 40.0000 ug | INTRAMUSCULAR | Status: DC

## 2011-08-23 MED ORDER — EPOETIN ALFA 10000 UNIT/ML IJ SOLN
6000.0000 [IU] | INTRAMUSCULAR | Status: DC
  Administered 2011-08-23: 6000 [IU] via SUBCUTANEOUS
  Filled 2011-08-23: qty 1

## 2011-08-23 MED ORDER — MAGNESIUM HYDROXIDE 400 MG/5ML PO SUSP
30.0000 mL | Freq: Two times a day (BID) | ORAL | Status: DC | PRN
  Administered 2011-08-23: 30 mL via ORAL
  Filled 2011-08-23: qty 30

## 2011-08-23 MED ORDER — SODIUM CHLORIDE 0.9 % IJ SOLN
INTRAMUSCULAR | Status: AC
  Administered 2011-08-23: 13:00:00
  Filled 2011-08-23: qty 3

## 2011-08-23 NOTE — Progress Notes (Signed)
Subjective: Interval History: No nausea or or vomiting. Patient states that her appetite is better. She denies any difficulty breathing.  Objective: Vital signs in last 24 hours: Temp:  [98.1 F (36.7 C)-99 F (37.2 C)] 98.6 F (37 C) (04/30 0981) Pulse Rate:  [77-88] 81  (04/30 0614) Resp:  [16-18] 16  (04/30 0614) BP: (90-117)/(49-66) 106/49 mmHg (04/30 0614) SpO2:  [93 %-98 %] 96 % (04/30 0614) Weight change:   Intake/Output from previous day: 04/29 0701 - 04/30 0700 In: 860 [P.O.:260; I.V.:600] Out: 701 [Urine:701] Intake/Output this shift:    General appearance: alert, cooperative and no distress Resp: clear to auscultation bilaterally Cardio: regular rate and rhythm, S1, S2 normal, no murmur, click, rub or gallop GI: soft, non-tender; bowel sounds normal; no masses,  no organomegaly Extremities: extremities normal, atraumatic, no cyanosis or edema  Lab Results:  Basename 08/23/11 0521 08/22/11 0544  WBC 4.6 5.4  HGB 7.2* 8.1*  HCT 22.1* 24.6*  PLT 200 210   BMET:  Basename 08/23/11 0521 08/22/11 0544  NA 140 136  K 3.9 4.1  CL 110 105  CO2 21 21  GLUCOSE 103* 176*  BUN 25* 37*  CREATININE 2.23* 2.63*  CALCIUM 8.1* 8.9   No results found for this basename: PTH:2 in the last 72 hours Iron Studies: No results found for this basename: IRON,TIBC,TRANSFERRIN,FERRITIN in the last 72 hours  Studies/Results: No results found.  I have reviewed the patient's current medications.  Assessment/Plan: Problem #1 renal failure acute on chronic her BUN is 25 creatinine of 2.23 renal function seems to be improving progressively. Presently she is none oliguric. Problem #2 hypokalemia potassium is 3.9 good Problem #3 history of anemia her hemoglobin 7.2 hematocrit 22.1 H&H is declining. This seems to be anemia of chronic disease. Problem #4 history of hypocalcemia calcium is 8.1 improving Problem #5 history of multiple myeloma Problem #6 hypertension her blood pressure  seems to control very well Problem #7 history of coronary artery disease Problem #8 history of GERD she is on Protonix. Plan: Decrease IV fluid to 50 cc per hour           Encourage by mouth intake of fluid.           We'll start her on Epogen 6000 units subcutaneous every 2 weeks           Since her renal function has stabilized and she is going to be discharged will follow her as an outpatient.    LOS: 5 days   Kaylenn Civil S 08/23/2011,7:54 AM

## 2011-08-23 NOTE — Progress Notes (Signed)
Attempted to see pt. Pt refused to participate in PT this morning and would like to bee seen this evening. Will attempt to see pt again if time allows.  Seth Bake, PTA

## 2011-08-23 NOTE — Progress Notes (Signed)
Physical Therapy Treatment Patient Details Name: TANEA MOGA MRN: [id number] DOB: 08-25-1940 Today's Date: 08/23/2011 Time: 4098-1191 PT Time Calculation (min): 30 min  PT Assessment / Plan / Recommendation Comments on Treatment Session       Follow Up Recommendations       Equipment Recommendations       Frequency     Plan      Precautions / Restrictions     Pertinent Vitals/Pain     Mobility  Transfers Sit to Stand: 4: Min assist Stand to Sit: 4: Min guard Ambulation/Gait Ambulation Distance (Feet): 12 Feet (12' x2) Assistive device: Rolling walker Gait Pattern: Shuffle;Trunk flexed Stairs: No Wheelchair Mobility Wheelchair Mobility: No    Exercises General Exercises - Lower Extremity Ankle Circles/Pumps: Strengthening;Both;10 reps;Seated Quad Sets: AROM;Both;10 reps;Seated Gluteal Sets: AROM;Both;10 reps;Seated Heel Slides: AAROM;Both;10 reps;Seated   PT Goals Acute Rehab PT Goals PT Goal: Sit to Stand - Progress: Progressing toward goal PT Goal: Stand to Sit - Progress: Progressing toward goal PT Goal: Ambulate - Progress: Progressing toward goal  Visit Information  Last PT Received On: 08/23/11    Subjective Data  Subjective: I was able to walk to the bathroom today!   Cognition       Balance     End of Session PT - End of Session Equipment Utilized During Treatment: Gait belt Activity Tolerance: Patient limited by fatigue Patient left: in chair;with call bell/phone within reach    Konrad Penta 08/23/2011, 1:37 PM

## 2011-08-23 NOTE — Progress Notes (Signed)
[name], [name] NO.:  [id number]  MEDICAL RECORD NO.:  [id number]  LOCATION:  A301                          FACILITY:  [facility]  PHYSICIAN:  Melvyn Novas, MDDATE OF BIRTH:  February 16, 1941  DATE OF PROCEDURE: DATE OF DISCHARGE:                                PROGRESS NOTE   The patient has acute renal failure, being repleted intravascularly with fluids.  Creatinine improved to 2.63 as of yesterday, awaiting this morning's creatinine and electrolytes.  VITAL SIGNS:  Blood pressure 106/49, temperature 98.6, pulse 81 and regular, respiratory rate is 16. NECK:  No JVD. LUNGS:  Diminished breath sounds at bases.  No rales, wheezes, or rhonchi appreciable.  HEART:  Regular rate and rhythm.  No S3 or S4. Trace to 1+ pedal edema.  The plan right now is to continue IV aggressive fluid hydration, monitor renal function and electrolytes, and continue all the current therapy.     Melvyn Novas, MD     RMD/MEDQ  D:  08/23/2011  T:  08/23/2011  Job:  [id number]

## 2011-08-23 NOTE — Progress Notes (Signed)
[id number] 

## 2011-08-23 NOTE — Progress Notes (Signed)
Paged Dondiego 2 tomes between 430 and 5 about hemoglobin and hematocrit. No response. Dr. Sudie Bailey on call after 5 and he says that he will let Dondiego deal with this tomorrow.

## 2011-08-24 NOTE — Progress Notes (Signed)
Physical Therapy Treatment Patient Details Name: [name] MRN: [id number] DOB: January 29, 1941 Today's Date: 08/24/2011 Time: 2130-8657 PT Time Calculation (min): 20 min  PT Assessment / Plan / Recommendation Comments on Treatment Session  Patient fatigues quickly but was able to perform 35' of gait trainging with RW;Min A. Patient get anxious to sit before it is safe so close guarding is needed. Patient requires rest breaks  during seated exercises as well    Follow Up Recommendations       Equipment Recommendations       Frequency     Plan      Precautions / Restrictions     Pertinent Vitals/Pain     Mobility  Bed Mobility Supine to Sit: 6: Modified independent (Device/Increase time) Transfers Sit to Stand: 4: Min guard Stand to Sit: 4: Min assist Details for Transfer Assistance: verbal cues for backing up to surface Ambulation/Gait Ambulation/Gait Assistance: 4: Min guard Ambulation Distance (Feet): 35 Feet Assistive device: Rolling walker Ambulation/Gait Assistance Details: verbal cues to keep RW close Gait Pattern: Trunk flexed Gait velocity: slow    Exercises General Exercises - Upper Extremity Shoulder Flexion: Both;20 reps General Exercises - Lower Extremity Long Arc Quad: Both;15 reps Hip ABduction/ADduction: Both (1 min)   PT Goals Acute Rehab PT Goals PT Goal: Supine/Side to Sit - Progress: Met PT Goal: Sit to Stand - Progress: Progressing toward goal PT Goal: Stand to Sit - Progress: Progressing toward goal PT Goal: Ambulate - Progress: Progressing toward goal  Visit Information  Last PT Received On: 08/24/11    Subjective Data      Cognition       Balance     End of Session PT - End of Session Equipment Utilized During Treatment: Gait belt Activity Tolerance: Patient limited by fatigue Patient left: in chair;with chair alarm set;with family/visitor present Nurse Communication: Mobility status    Aristidis Talerico ATKINSO 08/24/2011,  1:05 PM

## 2011-08-24 NOTE — Progress Notes (Signed)
[id number] 

## 2011-08-24 NOTE — Progress Notes (Signed)
[name]  MRN: [id number]  DOB/AGE: February 04, 1941 71 y.o.  Primary Care Physician:DONDIEGO,RICHARD M, MD, MD  Admit date: 08/18/2011  Chief Complaint:  Chief Complaint  Patient presents with  . Weakness    S-Pt presented on  08/18/2011 with  Chief Complaint  Patient presents with  . Weakness     Pt today feels better.t asked questions about :' when Can I go home"       . aspirin  81 mg Oral Daily  . enoxaparin (LOVENOX) injection  30 mg Subcutaneous Q24H  . epoetin (EPOGEN/PROCRIT) injection  6,000 Units Subcutaneous Q14 Days  . nystatin cream   Topical BID  . oxybutynin  5 mg Oral Daily  . pantoprazole  40 mg Oral Q1200  . rosuvastatin  20 mg Oral QHS  . sodium chloride             NWG:NFAOZ from the symptoms mentioned above,there are no other symptoms referable to all systems reviewed.  Physical Exam: Vital signs in last 24 hours: Temp:  [99 F (37.2 C)-99.2 F (37.3 C)] 99 F (37.2 C) (05/01 0624) Pulse Rate:  [75-88] 82  (05/01 0624) Resp:  [20] 20  (05/01 0624) BP: (113-127)/(73-81) 113/81 mmHg (05/01 0624) SpO2:  [93 %-98 %] 98 % (05/01 0624) Weight change:  Last BM Date: 08/24/11  Intake/Output from previous day: 04/30 0701 - 05/01 0700 In: -  Out: 300 [Urine:300]     Physical Exam: General- pt is awake,alert, oriented to time place and person Resp- No acute REsp distress, CTA B/L NO Rhonchi CVS- S1S2 regular in rate and rhythm GIT- BS+, soft, NT, ND EXT- NO LE Edema, Cyanosis   Lab Results: CBC  Basename 08/23/11 0521 08/22/11 0544  WBC 4.6 5.4  HGB 7.2* 8.1*  HCT 22.1* 24.6*  PLT 200 210    BMET  Basename 08/23/11 0521 08/22/11 0544  NA 140 136  K 3.9 4.1  CL 110 105  CO2 21 21  GLUCOSE 103* 176*  BUN 25* 37*  CREATININE 2.23* 2.63*  CALCIUM 8.1* 8.9   Trend Creat 2013 2.25==>4.96==>4.61==>4.57==>3.77==>3.16==>2.63==>2.23 2012 1.5--3.1 2011 1.5--2.1(Peak 3.9) 2010  0.95  MICRO Recent Results (from the past  240 hour(s))  CULTURE, BLOOD (ROUTINE X 2)     Status: Normal   Collection Time   08/18/11  4:22 PM      Component Value Range Status Comment   Specimen Description BLOOD PORTA CATH DRAWN BY RN   Final    Special Requests BOTTLES DRAWN AEROBIC ONLY 10CC   Final    Culture NO GROWTH 5 DAYS   Final    Report Status 08/23/2011 FINAL   Final   CULTURE, BLOOD (ROUTINE X 2)     Status: Normal   Collection Time   08/18/11  4:26 PM      Component Value Range Status Comment   Specimen Description BLOOD LEFT ANTECUBITAL   Final    Special Requests     Final    Value: BOTTLES DRAWN AEROBIC AND ANAEROBIC AEB=10CC ANA=4CC   Culture NO GROWTH 5 DAYS   Final    Report Status 08/23/2011 FINAL   Final   URINE CULTURE     Status: Normal   Collection Time   08/18/11  5:46 PM      Component Value Range Status Comment   Specimen Description URINE, CATHETERIZED   Final    Special Requests NONE   Final    Culture  Setup Time [id number]  Final    Colony Count NO GROWTH   Final    Culture NO GROWTH   Final    Report Status 08/19/2011 FINAL   Final       Lab Results  Component Value Date   PTH 51.8 08/19/2011   CALCIUM 8.1* 08/23/2011   PHOS 2.0* 08/22/2011      Iron 109 TIBC 217 Sats 50% Ferrittin 5001  Bone Marrow Biopsy 2011  1. Bone Marrow, Aspirate,Biopsy, and Clot, : - PLASMACYTOSIS CONSISTENT WITH PLASMA CELL MYELOMA. PERIPHERAL BLOOD: - NORMOCYTIC ANEMIA       Impression: 1)Renal  AKI on CKD AKI secondary to Prerenal/ATN/Hypercalcemia AKI now better CKD stage 3 . CKD since 2012 CKD secondary to HTN /MM /Multiple AKI Progression of CKD -Markedwth AKI Proteinura Absent .   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 Phosphorus NOT at goal.-LOW- NOT on BInders   5)Mutiple Myeloma- Being  followed by [facility]   6)FEN  Normokalemic NOrmonatremic   7)Acid base Co2 at goal     Plan:  Agree with current Tx and plan Agree with Bmet in am Pt will benefit from nephrology follow up as outpt           Loran Auguste S 08/24/2011, 10:17 AM

## 2011-08-24 NOTE — Progress Notes (Signed)
Physical Therapy Treatment Patient Details Name: NYELLE WOLFSON MRN: [id number] DOB: Sep 10, 1940 Today's Date: 08/24/2011 Time:  -     Patient refused therapy this am;preferring it this afternoon. Informed patient I would see a couple of patients first and then return to her she was agreeable.  PT Assessment / Plan / Recommendation   Comments on Treatment Session       Follow Up Recommendations       Equipment Recommendations       Frequency     Plan      Precautions / Restrictions Restrictions Weight Bearing Restrictions: No   Pertinent Vitals/Pain     Mobility       Exercises     PT Goals    Visit Information       Subjective Data      Cognition       Balance     End of Session      Choya Tornow ATKINSO 08/24/2011, 10:14 AM

## 2011-08-24 NOTE — Progress Notes (Signed)
NAMELINNAEA, AHN NO.:  [id number]  MEDICAL RECORD NO.:  [id number]  LOCATION:  A301                          FACILITY:  [facility]  PHYSICIAN:  Melvyn Novas, MDDATE OF BIRTH:  [birth year]  DATE OF PROCEDURE: DATE OF DISCHARGE:                                PROGRESS NOTE   The patient has acute renal failure, improving with aggressive fluid resuscitation.  Creatinine now down to 2.23, somewhat over baseline, but much improved.  Potassium 3.9.  VITAL SIGNS:  Blood pressure 113/81, temperature 99, pulse 82 and regular, respiratory rate is 20. LUNGS:  Clear. NECK:  No JVD.  There is diminished breath sounds at the bases.  No rales, wheezes, or rhonchi appreciable. HEART:  Regular rhythm.  No S3 or S4.  Plan right now is to continue aggressive fluid resuscitation.  BMET in the a.m., and we will plan on discharge within 24 hours.     Melvyn Novas, MD     RMD/MEDQ  D:  08/24/2011  T:  08/24/2011  Job:  [id number]

## 2011-08-25 LAB — BASIC METABOLIC PANEL
BUN: 10 mg/dL (ref 6–23)
Calcium: 8.2 mg/dL — ABNORMAL LOW (ref 8.4–10.5)
Creatinine, Ser: 1.42 mg/dL — ABNORMAL HIGH (ref 0.50–1.10)
GFR calc Af Amer: 43 mL/min — ABNORMAL LOW (ref >90)
GFR calc non Af Amer: 37 mL/min — ABNORMAL LOW (ref >90)

## 2011-08-25 MED ORDER — HEPARIN SOD (PORK) LOCK FLUSH 100 UNIT/ML IV SOLN
500.0000 [IU] | INTRAVENOUS | Status: AC | PRN
  Administered 2011-08-25: 500 [IU]
  Filled 2011-08-25: qty 5

## 2011-08-25 MED ORDER — NYSTATIN 100000 UNIT/GM EX CREA: TOPICAL_CREAM | Freq: Two times a day (BID) | CUTANEOUS | Status: DC

## 2011-08-25 MED ORDER — EPOETIN ALFA 10000 UNIT/ML IJ SOLN: 6000.0000 [IU] | INTRAMUSCULAR | Status: DC

## 2011-08-25 MED ORDER — SODIUM CHLORIDE 0.9 % IJ SOLN
INTRAMUSCULAR | Status: AC
  Administered 2011-08-25: 11:00:00
  Filled 2011-08-25: qty 3

## 2011-08-25 MED ORDER — SODIUM CHLORIDE 0.9 % IJ SOLN
INTRAMUSCULAR | Status: AC
  Administered 2011-08-25: 10 mL
  Filled 2011-08-25: qty 3

## 2011-08-25 NOTE — Discharge Instructions (Signed)
[facility] for registered nurse, physical therapy, and nursing aide.

## 2011-08-25 NOTE — Progress Notes (Signed)
Subjective: Interval History: has no complaint of nausea or vomiting. Her appetite is getting better. She denies any difficulty breathing and no orthopnea or paroxysmal nocturnal dyspnea. Patient denies also any chest pain her weakness has also improved..  Objective: Vital signs in last 24 hours: Temp:  [98.7 F (37.1 C)-99.1 F (37.3 C)] 98.7 F (37.1 C) (05/02 0530) Pulse Rate:  [78-93] 78  (05/02 0530) Resp:  [20] 20  (05/02 0530) BP: (112-127)/(70-83) 127/83 mmHg (05/02 0530) SpO2:  [96 %-98 %] 98 % (05/02 0530) Weight change:   Intake/Output from previous day: 05/01 0701 - 05/02 0700 In: 1257.5 [P.O.:600; I.V.:657.5] Out: 1800 [Urine:1800] Intake/Output this shift:    General appearance: alert, cooperative and no distress Resp: clear to auscultation bilaterally Cardio: regular rate and rhythm, S1, S2 normal, no murmur, click, rub or gallop GI: soft, non-tender; bowel sounds normal; no masses,  no organomegaly Extremities: extremities normal, atraumatic, no cyanosis or edema  Lab Results:  Basename 08/23/11 0521  WBC 4.6  HGB 7.2*  HCT 22.1*  PLT 200   BMET:  Basename 08/23/11 0521  NA 140  K 3.9  CL 110  CO2 21  GLUCOSE 103*  BUN 25*  CREATININE 2.23*  CALCIUM 8.1*   No results found for this basename: PTH:2 in the last 72 hours Iron Studies: No results found for this basename: IRON,TIBC,TRANSFERRIN,FERRITIN in the last 72 hours  Studies/Results: No results found.  I have reviewed the patient's current medications.  Assessment/Plan: Problem #1 renal failure acute on chronic her last BUN is 25 creatinine 2.23 renal function has improved and presently she is none oliguric. Problem #2 history of anemia this is secondary to chronic renal failure she is started on Epogen Problem #3 history of hypertension her pressure seems to be controlled very well Problem #4 history of hypercalcemia her calcium has corrected Problem #5 history of hyperkalemia potassium  is was normal range. Problem #6 history of multiple myeloma Problem #7 history of hypertension her blood pressure seems to be reasonably controlled. Problem #8 history of hyperlipidemia. Plan: We'll DC her IV fluid           I will see  patient in 3-4 weeks as outpatient when she is discharged.           We'll continue his other medications as before.    LOS: 7 days   Niamh Rada S 08/25/2011,8:24 AM

## 2011-08-25 NOTE — Care Management Note (Signed)
    Page 1 of 1   08/25/2011     2:41:38 PM   CARE MANAGEMENT NOTE 08/25/2011  Patient:  [name], [name]   Account Number:  [id number]  Date Initiated:  08/19/2011  Documentation initiated by:  Anibal Henderson  Subjective/Objective Assessment:   Admitted with UTI and RF pt lives at home with brother, and other family members assist with care. She and family members request aide at home     Action/Plan:   Explained they should apply for medicaid at social services, to see if pt qualifies for  medicaid aide services. Meanwhile, HH can be set up for the short term, and they agree with this plan   Anticipated DC Date:  08/21/2011   Anticipated DC Plan:  HOME W HOME HEALTH SERVICES      DC Planning Services  CM consult      [facility] Choice  HOME HEALTH   Choice offered to / List presented to:  C-1 Patient        HH arranged  HH-1 RN  HH-4 NURSE'S AIDE  HH-2 PT  HH-6 SOCIAL WORKER      HH agency  [facility]   Status of service:  Completed, signed off Medicare Important Message given?   (If response is "NO", the following Medicare IM given date fields will be blank) Date Medicare IM given:   Date Additional Medicare IM given:    Discharge Disposition:  HOME W HOME HEALTH SERVICES  Per UR Regulation:    If discussed at Long Length of Stay Meetings, dates discussed:   08/25/2011    Comments:  08/25/11 1439 Arlyss Queen, RN BSN CM Pt discharged home today with [facility] Alroy Bailiff of [facility] is aware and will collect the pts information from the chart. Pt HH services will start on 08/26/11. No DME needs noted, pt has walker at home for use. Pt stated that her daughters will be taking turns staying with pt around the clock.  08/19/11 1530 Anibal Henderson RN

## 2011-08-25 NOTE — Progress Notes (Signed)
[name], [name] NO.:  [id number]  MEDICAL RECORD NO.:  [id number]  LOCATION:  A301                          FACILITY:  [facility]  PHYSICIAN:  Melvyn Novas, MDDATE OF BIRTH:  07-26-1940  DATE OF PROCEDURE: DATE OF DISCHARGE:  08/25/2011                                PROGRESS NOTE   The patient is a 71 year old black female with history of hypertension, hyperlipidemia, multiple myeloma, chronic anemia from multiple myeloma receiving erythropoietin 6000 units every 2 weeks in outpatient clinic. She has been very well controlled.  She came into the hospital with acute renal failure.  Creatinine 4.5 with a significant blood component of intravascular volume depletion.  She was seen in consultation by Nephrology who decided upon aggressive fluid resuscitation and diuresis. With this plan, her renal function returned to near baseline with a creatinine of 1.42 on day of discharge.  She was hemodynamically stable throughout hospital stay.  She had not received erythropoietin for 1 month's time, and her hemoglobin drifted down to 7.2 with the delusional component.  She was given erythropoietin 6000 units while in the hospital.  We will have this scheduled every 2 weeks as an outpatient. She had no complaints of chest pain, dyspnea, and she was 24-hour urine for protein and creatinine revealed normal protein status.  No evidence of protein losing nephropathy.  She did have some low albumin and proteins while in hospital.  DISCHARGE MEDICINES:  Aspirin 81 mg p.o. daily, Epogen 6000 units every 2 weeks, Dulcolax 1 p.o. daily, Lasix 20 mg p.o. daily, Bystolic 5 mg p.o. daily, Prilosec 20 mg p.o. daily, Ditropan 5 mg p.o. daily, potassium chloride 20 mEq p.o. daily, Crestor 20 mg p.o. daily, Diovan 320 mg p.o. daily, Percocet 5/325 p.o. q.i.d. p.r.n. for pain.  The patient will be discharged to follow up my office within 1 week's time for assessment of  electrolytes and renal function.  She was told to be aggressive with drinking fluids.     Melvyn Novas, MD     RMD/MEDQ  D:  08/25/2011  T:  08/25/2011  Job:  [id number]

## 2011-08-25 NOTE — Progress Notes (Signed)
08/25/11 1512 Patient discharged home this afternoon. Reviewed discharge instructions with patient, daughter at bedside. Given copy of instructions, med list, prescriptions. F/u appointments set up and reviewed with patient. Verbalized understanding of instructions. Port flushed with saline and heparin flush per protocol prior to deaccess, site within normal limits. Denied pain or other discomfort. [facility], to see patient tomorrow per case management.  Pt left floor in stable condition via w/c accompanied by nurse tech.

## 2011-08-25 NOTE — Discharge Summary (Signed)
[id number] 

## 2011-08-29 NOTE — Progress Notes (Signed)
Discharge summary sent to payer through [facility]  

## 2011-09-01 ENCOUNTER — Encounter (HOSPITAL_COMMUNITY): Payer: Medicare HMO | Attending: Oncology

## 2011-09-01 ENCOUNTER — Encounter (HOSPITAL_BASED_OUTPATIENT_CLINIC_OR_DEPARTMENT_OTHER): Payer: Medicare HMO | Admitting: Oncology

## 2011-09-01 VITALS — BP 89/69 | HR 72 | Temp 98.6°F

## 2011-09-01 DIAGNOSIS — R109 Unspecified abdominal pain: Secondary | ICD-10-CM

## 2011-09-01 DIAGNOSIS — G609 Hereditary and idiopathic neuropathy, unspecified: Secondary | ICD-10-CM

## 2011-09-01 DIAGNOSIS — N289 Disorder of kidney and ureter, unspecified: Secondary | ICD-10-CM

## 2011-09-01 DIAGNOSIS — C9000 Multiple myeloma not having achieved remission: Secondary | ICD-10-CM | POA: Insufficient documentation

## 2011-09-01 LAB — COMPREHENSIVE METABOLIC PANEL
ALT: 7 U/L (ref 0–35)
AST: 12 U/L (ref 0–37)
Albumin: 3.1 g/dL — ABNORMAL LOW (ref 3.5–5.2)
CO2: 23 mEq/L (ref 19–32)
Calcium: 9.3 mg/dL (ref 8.4–10.5)
Chloride: 108 mEq/L (ref 96–112)
Creatinine, Ser: 1.16 mg/dL — ABNORMAL HIGH (ref 0.50–1.10)
GFR calc non Af Amer: 47 mL/min — ABNORMAL LOW (ref >90)
Sodium: 139 mEq/L (ref 135–145)
Total Bilirubin: 0.3 mg/dL (ref 0.3–1.2)

## 2011-09-01 LAB — DIFFERENTIAL
Basophils Absolute: 0 10*3/uL (ref 0.0–0.1)
Basophils Relative: 0 % (ref 0–1)
Lymphocytes Relative: 16 % (ref 12–46)
Neutro Abs: 2.9 10*3/uL (ref 1.7–7.7)

## 2011-09-01 LAB — CBC
MCHC: 30.5 g/dL (ref 30.0–36.0)
Platelets: 216 10*3/uL (ref 150–400)
RDW: 15.8 % — ABNORMAL HIGH (ref 11.5–15.5)
WBC: 4.3 10*3/uL (ref 4.0–10.5)

## 2011-09-01 MED ORDER — SODIUM CHLORIDE 0.9 % IJ SOLN
10.0000 mL | INTRAMUSCULAR | Status: DC | PRN
  Administered 2011-09-01: 10 mL via INTRAVENOUS
  Filled 2011-09-01: qty 10

## 2011-09-01 MED ORDER — HEPARIN SOD (PORK) LOCK FLUSH 100 UNIT/ML IV SOLN
500.0000 [IU] | Freq: Once | INTRAVENOUS | Status: AC
  Administered 2011-09-01: 500 [IU] via INTRAVENOUS
  Filled 2011-09-01: qty 5

## 2011-09-01 MED ORDER — SODIUM CHLORIDE 0.9 % IJ SOLN
INTRAMUSCULAR | Status: AC
  Filled 2011-09-01: qty 10

## 2011-09-01 MED ORDER — OXYCODONE-ACETAMINOPHEN 5-325 MG PO TABS: 1.0000 | ORAL_TABLET | Freq: Four times a day (QID) | ORAL | Status: DC | PRN

## 2011-09-01 MED ORDER — HEPARIN SOD (PORK) LOCK FLUSH 100 UNIT/ML IV SOLN
INTRAVENOUS | Status: AC
  Filled 2011-09-01: qty 5

## 2011-09-01 NOTE — Patient Instructions (Signed)
[facility]  Discharge Instructions  RECOMMENDATIONS MADE BY THE CONSULTANT AND ANY TEST RESULTS WILL BE SENT TO YOUR REFERRING DOCTOR.   We want you to drink at least 64 ounces of fluid daily, up to 100 ounces if possible and record every amount that you drink. We want you to measure and record all of your urine output starting today through Sunday and call us on Monday with details.  We will make a referral to a Neurologist in [place].  We will soon try to start you on Pomalyst (a pill) for your Multiple Myeloma.  Call us with any problems you are having prior to coming back to see the doctor.    I acknowledge that I have been informed and understand all the instructions given to me and received a copy. I do not have any more questions at this time, but understand that I may call the Specialty Clinic at [facility] at [phone] during business hours should I have any further questions or need assistance in obtaining follow-up care.    __________________________________________  _____________  __________ Signature of Patient or Authorized Representative            Date                   Time    __________________________________________ Nurse's Signature

## 2011-09-01 NOTE — Progress Notes (Signed)
[name] presented for Portacath access and flush. Proper placement of portacath confirmed by CXR. Portacath located right chest wall accessed with  H 20 needle. Good blood return present. Portacath flushed with 20ml NS and 500U/5ml Heparin and needle removed intact. Procedure without incident. Patient tolerated procedure well.   

## 2011-09-01 NOTE — Progress Notes (Signed)
Diane Stalling, MD, MD [address] [place] [address]  1. MULTIPLE  MYELOMA  dexamethasone (DECADRON) 4 MG tablet, ondansetron (ZOFRAN) 8 MG tablet, CBC, Differential, Comprehensive metabolic panel, Multiple myeloma panel, serum, Kappa/lambda light chains, CBC, Differential, Comprehensive metabolic panel, Multiple myeloma panel, serum, Kappa/lambda light chains, sodium chloride 0.9 % injection 10 mL, heparin lock flush 100 unit/mL, oxyCODONE-acetaminophen (PERCOCET) 5-325 MG per tablet    CURRENT THERAPY: S/P 6 cycles of Carfilzomib.  Will change to Pomalyst  INTERVAL HISTORY: [name] 71 y.o. female returns for  regular  visit for followup of  IgG lambda multiple myeloma, actually with 2 M-spikes in the past, presented initially in 1997 with an monoclonal gammopathy of undetermined significance.   [name] was recently in the hospital for renal failure. She reports that she got ill and subsequently decreased her by mouth intake of fluids and food. This initiating her renal failure. Fortunately she was able to report to the emergency department where she was admitted, rehydrate, and her renal function is back to baseline and actually improved.    The patient reports 2 complaints. She explains that she thinks she has seizures. She reports that approximately 1-3 times per day she loses consciousness for a few seconds. This is witnessed by her son who is not with her today. She explains that her son reports that her eyes roll back in her head and she loses consciousness.  She denies any incontinence during these episodes. She denies any post icteric states. She does admit to a are which he describes as numbness just seconds prior to these episodes. She reports that these do not occur during positional changes.  MRI of brain reveals mild to moderate diffuse small vessel disease.  No acute findings as of 03/22/2011 of MRI brain. Patient reports that these episodes have been occurring for quite  some time and she explains that they were likely occurring prior to the study.  So we will refer the patient to neurology for further evaluation of your symptoms.  The patient reported as a nurse that occasionally she is anuretic. On further discussion with the patient, she reports that she never goes a day without urinating. She is drinking two 16 ounce glasses of water daily.  She explains that she urinates 1-3 times daily. She describes each urination as a steady stream.  Assessment, the patient discussing the need to increase her fluid intake. She is to consume 4-6 glasses of 16 ounces of water daily at the minimum. I provided the patient the patient education regarding the importance of hydration and its role in kidney function. She is appreciative of this education. She needs to do this to keep her kidney function at a quick and prevent any renal failure in the future. I've explained to the patient that we need her kidneys operating appropriately and she is to continue with chemotherapy.  The patient will be changed from Carfilzomib chemotherapy to Pomalyst. I provided patient education regarding this medication. She understands that she will take this medication daily for 21 days out of 28 day cycle. We will work on getting this approved for her insurance. She will not start this medication until we verify her urine output.  To further delineate her urine output, we provide the patient with a urine collection apparatus which can be attached their toilet. She is to measure all urine output starting today. She will probably how much urine output she has a daily basis. He will contact the clinic on  Monday to report this urine output. She will also document, water she is consuming.  In the interim, I have asked our financial counselor to work on getting this medication for the patient. This will likely take a number of days.  Otherwise the patient denies any complaints oncologically or  hematologically.  Past Medical History  Diagnosis Date  . Essential hypertension, benign   . Coronary atherosclerosis of native coronary artery     Former patient of [facility] - PTCA apical LAD 2005  . Type 2 diabetes mellitus     Borderline  . GERD (gastroesophageal reflux disease)   . Hypercholesteremia   . Female stress incontinence   . UTI (urinary tract infection)   . Vertigo   . Poor sleep pattern   . Colon cancer   . Multiple myeloma     has MULTIPLE  MYELOMA; HYPERLIPIDEMIA; Essential hypertension, benign; Coronary atherosclerosis of native coronary artery; Edema leg; HX, PERSONAL, MALIGNANCY, COLON; Vertigo; and Palpitations on her problem list.     is allergic to gabapentin and simvastatin.  Ms. Boening had no medications administered during this visit.  Past Surgical History  Procedure Date  . Colon surgery     Resection, [facility]  . Abdominal hysterectomy 1970s    [facility]  . Knee arthroscopy     LEFT, [facility]  . Heel spur surgery     KEELING, [facility]  . Portacath placement     [facility]   . Cervical spine surgery 2004    [facility]  . Port-a-cath removal 11/05/2010    Procedure: REMOVAL PORT-A-CATH;  Surgeon: Dalia Heading;  Location: [facility];  Service: General;  Laterality: Left;  started at 0846  . Portacath placement 11/05/2010    Procedure: INSERTION PORT-A-CATH;  Surgeon: Dalia Heading;  Location: [facility];  Service: General;  Laterality: Right;  Right subclavian, device left accessed     Denies any headaches, dizziness, double vision, fevers, chills, night sweats, nausea, vomiting, diarrhea, constipation, chest pain, heart palpitations, shortness of breath, blood in stool, black tarry stool, urinary pain, urinary burning, urinary frequency, hematuria.   PHYSICAL EXAMINATION  ECOG PERFORMANCE STATUS: 2 - Symptomatic, <50% confined to bed  Filed Vitals:   09/01/11 0923  BP: 89/69  Pulse: 72  Temp: 98.6 F (37 C)    GENERAL:alert, no distress, well nourished, well developed,  comfortable, cooperative, obese and smiling.  Seen in wheelchair SKIN: skin color, texture, turgor are normal, no rashes or significant lesions HEAD: Normocephalic, No masses, lesions, tenderness or abnormalities EYES: normal, EOMI, Conjunctiva are pink and non-injected EARS: External ears normal OROPHARYNX:lips, buccal mucosa, and tongue normal and mucous membranes are moist  NECK: supple, trachea midline LYMPH:  not examined BREAST:not examined LUNGS: clear to auscultation  HEART: regular rate & rhythm, no murmurs, no gallops, S1 normal and S2 normal ABDOMEN:abdomen soft, non-tender, obese and normal bowel sounds BACK: Back symmetric, no curvature. EXTREMITIES:less then 2 second capillary refill, no joint deformities, effusion, or inflammation, no clubbing, no cyanosis  NEURO: alert & oriented x 3 with fluent speech, no focal motor/sensory deficits   PENDING LABS: CBC diff, CMET, MM panel    ASSESSMENT:  1. IgG lambda multiple myeloma, actually with 2 M-spikes in the past, presented initially in 1997 with an monoclonal gammopathy of undetermined significance.  2. Syncopal episode 3. Renal  Failure, improved. 4. Chronic lower extremity leg edema.  5. Peripheral neuropathy, grade 1-2, still with significant pain in a stocking distribution after treatment with Revlimid.  6.  Revlimid-induced skin toxicity with contraction of the skin of the legs and feet.  7. Abdominal pain, aching in nature, epigastric in location, not associated with changes by use of meals or medications other than her pain medication takes it away for 15 minutes and then it is back. We also gave her a GI cocktail this morning, took it away for about 15 minutes and it is back. Negative Abdominal Ultrasound for gallstones.  8. Obesity.   9. Hyperparathyroidism, which may be secondary, and is being evaluated by Dr. Kristian Covey presently.       PLAN:  1. Refer the patient to neurology. 2. Urine measuring apparatus  provided. 3. Patient is to measure urine and call clinic on Monday with results. 4. Patient will increase oral fluids to 4-6, 16 oz fluid containers of H2O.  The patient may utilize lemon to help with the water taste.  Patient is to avoid all caffeinated beverages. 5. Lab work today: CBC diff, CMET, MM panel. 6. Discussed Pomalyst with the patient.  She understands that we will be changing chemotherapy regimens due to her recent renal failure.  7. Discussed Pomalyst with April Manson, Financial Counselr and she will work ion getting the medication for the patient.  8. Return in 2 weeks for follow-up.   All questions were answered. The patient knows to call the clinic with any problems, questions or concerns. We can certainly see the patient much sooner if necessary.   Vonte Rossin

## 2011-09-02 ENCOUNTER — Telehealth (HOSPITAL_COMMUNITY): Payer: Self-pay | Admitting: Oncology

## 2011-09-02 LAB — KAPPA/LAMBDA LIGHT CHAINS
Kappa free light chain: 1.93 mg/dL (ref 0.33–1.94)
Kappa, lambda light chain ratio: 0.51 (ref 0.26–1.65)
Lambda free light chains: 3.75 mg/dL — ABNORMAL HIGH (ref 0.57–2.63)

## 2011-09-02 NOTE — Telephone Encounter (Signed)
PC FROM JESSIE([facility]) [phone] SHE ?'D IF PT HAS FAILED REVLIMID AND VELCADE. I ADVSD JESSIE THAT PER CHARTS FLOW SHEET PT HAS FAILED BOTH SHE STATED THAT REQUEST HAS BEEN FORWARDED TO REVIEWER.

## 2011-09-07 LAB — MULTIPLE MYELOMA PANEL, SERUM
Albumin ELP: 53.4 % — ABNORMAL LOW (ref 55.8–66.1)
Alpha-1-Globulin: 6.5 % — ABNORMAL HIGH (ref 2.9–4.9)
Beta 2: 5.7 % (ref 3.2–6.5)
Beta Globulin: 4.6 % — ABNORMAL LOW (ref 4.7–7.2)
Gamma Globulin: 16.3 % (ref 11.1–18.8)
IgM, Serum: 28 mg/dL — ABNORMAL LOW (ref 52–322)

## 2011-09-14 ENCOUNTER — Encounter (HOSPITAL_BASED_OUTPATIENT_CLINIC_OR_DEPARTMENT_OTHER): Payer: Medicare HMO | Admitting: Oncology

## 2011-09-14 VITALS — BP 137/82 | HR 71 | Temp 99.2°F | Wt 203.0 lb

## 2011-09-14 DIAGNOSIS — R109 Unspecified abdominal pain: Secondary | ICD-10-CM

## 2011-09-14 DIAGNOSIS — R609 Edema, unspecified: Secondary | ICD-10-CM

## 2011-09-14 DIAGNOSIS — G609 Hereditary and idiopathic neuropathy, unspecified: Secondary | ICD-10-CM

## 2011-09-14 DIAGNOSIS — C9000 Multiple myeloma not having achieved remission: Secondary | ICD-10-CM

## 2011-09-14 MED ORDER — DEXAMETHASONE 4 MG PO TABS: ORAL_TABLET | ORAL | Status: DC

## 2011-09-14 MED ORDER — POMALIDOMIDE 4 MG PO CAPS: 4.0000 mg | ORAL_CAPSULE | Freq: Every day | ORAL | Status: DC

## 2011-09-14 NOTE — Patient Instructions (Signed)
[name]  [id number] 11-24-1940 Dr. Glenford Peers   [facility]  Discharge Instructions  RECOMMENDATIONS MADE BY THE CONSULTANT AND ANY TEST RESULTS WILL BE SENT TO YOUR REFERRING DOCTOR.   EXAM FINDINGS BY MD TODAY AND SIGNS AND SYMPTOMS TO REPORT TO CLINIC OR PRIMARY MD: Need to see what the neurologist says and then we will see you back.  MEDICATIONS PRESCRIBED: none   INSTRUCTIONS GIVEN AND DISCUSSED: Other :  Report increased shortness of breath, uncontrolled pain, fevers, etc.  SPECIAL INSTRUCTIONS/FOLLOW-UP: Return to Clinic in 2 weeks to see PA   I acknowledge that I have been informed and understand all the instructions given to me and received a copy. I do not have any more questions at this time, but understand that I may call the Specialty Clinic at [facility] at [phone] during business hours should I have any further questions or need assistance in obtaining follow-up care.    __________________________________________  _____________  __________ Signature of Patient or Authorized Representative            Date                   Time    __________________________________________ Nurse's Signature

## 2011-09-14 NOTE — Progress Notes (Signed)
Diane Stalling, MD, MD [address] [place] [address]  1. MULTIPLE  MYELOMA  Pomalidomide (POMALYST) 4 MG CAPS, dexamethasone (DECADRON) 4 MG tablet    CURRENT THERAPY: On hold.  INTERVAL HISTORY: [name] 71 y.o. female returns for  regular  visit for followup of  IgG lambda multiple myeloma, actually with 2 M-spikes in the past, presented initially in 1997 with an monoclonal gammopathy of undetermined significance.   [name] is drinking more H2O and reports 48-64 oz daily.  She is drinking enough H2O to make her urine clear.  She reports that she "cannot drink any more water!"    She has an appointment with neurologist for "spells" that she has.  They sound like absence (partial) seizures.   I have asked the patient to write down all of her signs and symptoms surrounding these episode on a piece of paper tonight before she sees the neurologist and ask her family to interject their observations as well on this hand written document.  This is to prevent the patient from getting flustered during discussion with the Neurologist.  She has agreed to due this.   She reports to an aura/feeling before these episode occur. They occur quickly following this sensation.  She denies bladder of bowel incontinence during these flares.   We are anticipating switching the patient to Pomalyst chemotherapy but will wait for her evaluation with the Neurologist before considering initiation of this medication.    [name] is otherwise doing well.  We spent some time discussing this new regimen which will consist of daily Pomalyst 3 out of 4 weeks, weekly Dexamethasone 40 mg, and procrit/aranesp if needed pending lab work.  She will get lab work weekly x 8 weeks at first.  She is already taking 81 mg of ASA daily which will be needed for DVT/PE prophylaxis while on this chemotherapy medication.   Past Medical History  Diagnosis Date  . Essential hypertension, benign   . Coronary atherosclerosis of  native coronary artery     Former patient of [facility] - PTCA apical LAD 2005  . Type 2 diabetes mellitus     Borderline  . GERD (gastroesophageal reflux disease)   . Hypercholesteremia   . Female stress incontinence   . UTI (urinary tract infection)   . Vertigo   . Poor sleep pattern   . Colon cancer   . Multiple myeloma     has MULTIPLE  MYELOMA; HYPERLIPIDEMIA; Essential hypertension, benign; Coronary atherosclerosis of native coronary artery; Edema leg; HX, PERSONAL, MALIGNANCY, COLON; Vertigo; and Palpitations on her problem list.     is allergic to gabapentin and simvastatin.  [name] does not currently have medications on file.  Past Surgical History  Procedure Date  . Colon surgery     Resection, [facility]  . Abdominal hysterectomy 1970s    [facility]  . Knee arthroscopy     LEFT, [facility]  . Heel spur surgery     KEELING, [facility]  . Portacath placement     [facility]   . Cervical spine surgery 2004    [facility]  . Port-a-cath removal 11/05/2010    Procedure: REMOVAL PORT-A-CATH;  Surgeon: Dalia Heading;  Location: [facility];  Service: General;  Laterality: Left;  started at 0846  . Portacath placement 11/05/2010    Procedure: INSERTION PORT-A-CATH;  Surgeon: Dalia Heading;  Location: [facility];  Service: General;  Laterality: Right;  Right subclavian, device left accessed     Denies any headaches, dizziness,  double vision, fevers, chills, night sweats, nausea, vomiting, diarrhea, constipation, chest pain, heart palpitations, shortness of breath, blood in stool, black tarry stool, urinary pain, urinary burning, urinary frequency, hematuria.   PHYSICAL EXAMINATION  ECOG PERFORMANCE STATUS: 2 - Symptomatic, <50% confined to bed  Filed Vitals:   09/14/11 1531  BP: 137/82  Pulse: 71  Temp: 99.2 F (37.3 C)    GENERAL:alert, well nourished, well developed, comfortable, cooperative, obese and smiling SKIN: skin color, texture, turgor are normal, no rashes or significant lesions HEAD: Normocephalic,  No masses, lesions, tenderness or abnormalities EYES: normal, Conjunctiva are pink and non-injected EARS: External ears normal OROPHARYNX:lips, buccal mucosa, and tongue normal and mucous membranes are moist  NECK: supple, trachea midline LYMPH:  not examined BREAST:not examined LUNGS: clear to auscultation and percussion HEART: regular rate & rhythm, no murmurs, no gallops, S1 normal and S2 normal ABDOMEN:abdomen soft, non-tender and normal bowel sounds BACK: Back symmetric, no curvature. EXTREMITIES:less then 2 second capillary refill, no joint deformities, effusion, or inflammation, no edema, no skin discoloration, no clubbing, no cyanosis  NEURO: alert & oriented x 3 with fluent speech, no focal motor/sensory deficits, gait normal     ASSESSMENT:  1. IgG lambda multiple myeloma, actually with 2 M-spikes in the past, presented initially in 1997 with an monoclonal gammopathy of undetermined significance.  2. ?Abscence seizures? To be evaluated by Neurology 3. Renal Failure, improved.  4. Chronic lower extremity leg edema.  5. Peripheral neuropathy, grade 1-2, still with significant pain in a stocking distribution after treatment with Revlimid.  6. Revlimid-induced skin toxicity with contraction of the skin of the legs and feet.  7. Abdominal pain, aching in nature, epigastric in location, not associated with changes by use of meals or medications other than her pain medication takes it away for 15 minutes and then it is back. We also gave her a GI cocktail this morning, took it away for about 15 minutes and it is back. Negative Abdominal Ultrasound for gallstones.  8. Obesity.  9. Hyperparathyroidism, which may be secondary, and is being evaluated by Dr. Kristian Covey presently.     PLAN:  1. Neurology consultation tomorrow. 2. Will hold initiation of Pomalyst for MM until Neurology evaluation complete.  3. Encouraged the patient to continue H2O intake and increase to 64 oz.  4.  Discussed Pomalyst therapy with the patient.  5. Return in 2 weeks for follow-up.   All questions were answered. The patient knows to call the clinic with any problems, questions or concerns. We can certainly see the patient much sooner if necessary.  The patient and plan discussed with Glenford Peers, MD and he is in agreement with the aforementioned.  [name]

## 2011-09-16 ENCOUNTER — Ambulatory Visit (HOSPITAL_COMMUNITY): Payer: Medicare HMO | Admitting: Oncology

## 2011-09-16 MED ORDER — POMALIDOMIDE 4 MG PO CAPS: 4.0000 mg | ORAL_CAPSULE | Freq: Every day | ORAL | Status: DC

## 2011-09-16 MED ORDER — DEXAMETHASONE 4 MG PO TABS: ORAL_TABLET | ORAL | Status: DC

## 2011-09-16 NOTE — Progress Notes (Signed)
This encounter was created in error - please disregard.

## 2011-09-20 ENCOUNTER — Telehealth (HOSPITAL_COMMUNITY): Payer: Self-pay | Admitting: *Deleted

## 2011-09-20 NOTE — Telephone Encounter (Signed)
Pt called to say that she does not have date and time for brain scan that is supposed to be done before neurology appt. I called their office [phone] and left message with nurse to please call [facility] and set things up.

## 2011-09-28 ENCOUNTER — Encounter (HOSPITAL_COMMUNITY): Payer: Medicare HMO | Attending: Oncology | Admitting: Oncology

## 2011-09-28 ENCOUNTER — Telehealth (HOSPITAL_COMMUNITY): Payer: Self-pay | Admitting: Oncology

## 2011-09-28 VITALS — BP 155/89 | HR 77 | Temp 99.4°F | Wt 208.0 lb

## 2011-09-28 DIAGNOSIS — C9000 Multiple myeloma not having achieved remission: Secondary | ICD-10-CM | POA: Insufficient documentation

## 2011-09-28 DIAGNOSIS — D649 Anemia, unspecified: Secondary | ICD-10-CM

## 2011-09-28 DIAGNOSIS — G609 Hereditary and idiopathic neuropathy, unspecified: Secondary | ICD-10-CM

## 2011-09-28 DIAGNOSIS — I10 Essential (primary) hypertension: Secondary | ICD-10-CM | POA: Insufficient documentation

## 2011-09-28 DIAGNOSIS — R609 Edema, unspecified: Secondary | ICD-10-CM

## 2011-09-28 LAB — CBC
MCH: 29.8 pg (ref 26.0–34.0)
MCV: 95.9 fL (ref 78.0–100.0)
Platelets: 235 10*3/uL (ref 150–400)
RBC: 2.95 MIL/uL — ABNORMAL LOW (ref 3.87–5.11)
RDW: 13.6 % (ref 11.5–15.5)

## 2011-09-28 LAB — COMPREHENSIVE METABOLIC PANEL
ALT: 8 U/L (ref 0–35)
Calcium: 9.6 mg/dL (ref 8.4–10.5)
GFR calc Af Amer: 53 mL/min — ABNORMAL LOW (ref >90)
Glucose, Bld: 131 mg/dL — ABNORMAL HIGH (ref 70–99)
Sodium: 139 mEq/L (ref 135–145)
Total Protein: 7.2 g/dL (ref 6.0–8.3)

## 2011-09-28 LAB — DIFFERENTIAL
Eosinophils Absolute: 0.2 10*3/uL (ref 0.0–0.7)
Eosinophils Relative: 5 % (ref 0–5)
Lymphs Abs: 1 10*3/uL (ref 0.7–4.0)

## 2011-09-28 MED ORDER — DARBEPOETIN ALFA-POLYSORBATE 500 MCG/ML IJ SOLN
500.0000 ug | Freq: Once | INTRAMUSCULAR | Status: AC
  Administered 2011-09-28: 500 ug via SUBCUTANEOUS

## 2011-09-28 MED ORDER — HEPARIN SOD (PORK) LOCK FLUSH 100 UNIT/ML IV SOLN
INTRAVENOUS | Status: AC
  Filled 2011-09-28: qty 5

## 2011-09-28 MED ORDER — DARBEPOETIN ALFA-POLYSORBATE 500 MCG/ML IJ SOLN
INTRAMUSCULAR | Status: AC
  Filled 2011-09-28: qty 1

## 2011-09-28 MED ORDER — SODIUM CHLORIDE 0.9 % IJ SOLN
INTRAMUSCULAR | Status: AC
  Filled 2011-09-28: qty 10

## 2011-09-28 MED ORDER — DEXAMETHASONE 4 MG PO TABS: ORAL_TABLET | ORAL | Status: DC

## 2011-09-28 MED ORDER — POMALIDOMIDE 4 MG PO CAPS: 4.0000 mg | ORAL_CAPSULE | Freq: Every day | ORAL | Status: DC

## 2011-09-28 MED ORDER — HEPARIN SOD (PORK) LOCK FLUSH 100 UNIT/ML IV SOLN
500.0000 [IU] | Freq: Once | INTRAVENOUS | Status: AC
  Administered 2011-09-28: 500 [IU] via INTRAVENOUS
  Filled 2011-09-28: qty 5

## 2011-09-28 MED ORDER — SODIUM CHLORIDE 0.9 % IJ SOLN
10.0000 mL | INTRAMUSCULAR | Status: DC | PRN
  Administered 2011-09-28: 10 mL via INTRAVENOUS
  Filled 2011-09-28: qty 10

## 2011-09-28 NOTE — Progress Notes (Signed)
[name] presented for Portacath access and flush. Proper placement of portacath confirmed by CXR. Portacath located right chest wall accessed with  H 20 needle. Good blood return present. Lab work obtained. Portacath flushed with 20ml NS and 500U/31ml Heparin and needle removed intact. Procedure without incident. Patient tolerated procedure well.  [name] presents today for injection per MD orders. Aranesp 500 mcg administered SQ in left Abdomen. Administration without incident. Patient tolerated well.

## 2011-09-28 NOTE — Telephone Encounter (Signed)
[facility]-[phone] ? IF PRIOR AUTH WAS COMPLETED PER CSR YES IT HAS BEEN APPROVED FROM 09/02/11-12/01/11 AUTH# [id number]. IF PT IS ON IT AFTER 8/8 RESEND ORIG PRIOR AUTH FORM AND JUST CHANGE THE DATE

## 2011-09-28 NOTE — Patient Instructions (Signed)
[facility]  Discharge Instructions  RECOMMENDATIONS MADE BY THE CONSULTANT AND ANY TEST RESULTS WILL BE SENT TO YOUR REFERRING DOCTOR.    Port flush, labs and Aranesp done today. We will continue to work on getting you the Mahek Schlesinger medication Pomalyst. This may take 2-3 weeks. We will see you back in 3 weeks for labs, Aranesp injection and the intent to start the Pomalyst. Once you start the Ronith Berti medication Pomalyst, we will need to see you weekly for lab work to keep a watch on your blood counts. Return to see MD in 5 weeks. Call with any issues/concerns as needed. Keep a watchful eye on fluid intake, urinary output, swelling in feet/legs, change in breathing or mental status.    I acknowledge that I have been informed and understand all the instructions given to me and received a copy. I do not have any more questions at this time, but understand that I may call the Specialty Clinic at [facility] at [phone] during business hours should I have any further questions or need assistance in obtaining follow-up care.    __________________________________________  _____________  __________ Signature of Patient or Authorized Representative            Date                   Time    __________________________________________ Nurse's Signature

## 2011-09-28 NOTE — Progress Notes (Signed)
Diane Stalling, MD, MD [address] [place] [address]  1. MULTIPLE  MYELOMA     CURRENT THERAPY:On hold   INTERVAL HISTORY: [name] 71 y.o. female returns for  regular  visit for followup of IgG lambda multiple myeloma, actually with 2 M-spikes in the past, presented initially in 1997 with an monoclonal gammopathy of undetermined significance.   The patient did see neurology who did not believe her "episodes" were seizures, but could be histrionic or cardiogenic.  He was going to perform an MRI of the brain for follow-up and I have not seen this study result yet.  Therefore, we will pursue Pomalyst therapy.  An Rx was written and provided to [name], Pre-Authorization Specialist, and she is pursuing this medication for the patient with the help of [employer] representative, [name].  The Rx illustrates not only the dosage and directions for the Pomalyst, but also the Dexamethasone.  She was informed to continue her 81 mg baby aspirin for thromboembolism prophylaxis.  She is agreeable to this plan.  She was encouraged to follow-up with neurology as directed.   The patient reports that she had an EEG performed by the neurologist as well.  I do not have those results at this time.   [name] is otherwise doing well.  She continues to consume adequate PO fluids.  We discussed her future therapy plan.  I have provided an Rx to our Artist for her to submit to the patient's insurance.   [name] denies any complaints at this time.    I personally reviewed and went over laboratory results with the patient.   Past Medical History  Diagnosis Date  . Essential hypertension, benign   . Coronary atherosclerosis of native coronary artery     Former patient of [facility] - PTCA apical LAD 2005  . Type 2 diabetes mellitus     Borderline  . GERD (gastroesophageal reflux disease)   . Hypercholesteremia   . Female incontinence   . UTI (urinary tract infection)   . Vertigo   . Poor  sleep pattern   . Colon cancer   . Multiple myeloma     has MULTIPLE  MYELOMA; HYPERLIPIDEMIA; Essential hypertension, benign; Coronary atherosclerosis of native coronary artery; Edema leg; HX, PERSONAL, MALIGNANCY, COLON; Vertigo; and Palpitations on her problem list.     is allergic to gabapentin and simvastatin.  [name] does not currently have medications on file.  Past Surgical History  Procedure Date  . Colon surgery     Resection, [facility]  . Abdominal hysterectomy 1970s    [facility]  . Knee arthroscopy     LEFT, [facility]  . Heel spur surgery     [name], [facility]  . Portacath placement     [facility]   . Cervical spine surgery 2004    [name]  . Port-a-cath removal 11/05/2010    Procedure: REMOVAL PORT-A-CATH;  Surgeon: Dalia Heading;  Location: [facility];  Service: General;  Laterality: Left;  started at 0846  . Portacath placement 11/05/2010    Procedure: INSERTION PORT-A-CATH;  Surgeon: Dalia Heading;  Location: [facility];  Service: General;  Laterality: Right;  Right subclavian, device left accessed     Denies any headaches, dizziness, double vision, fevers, chills, night sweats, nausea, vomiting, diarrhea, constipation, chest pain, heart palpitations, shortness of breath, blood in stool, black tarry stool, urinary pain, urinary burning, urinary frequency, hematuria.   PHYSICAL EXAMINATION  ECOG PERFORMANCE STATUS: 2 - Symptomatic, <50% confined to  bed  Filed Vitals:   09/28/11 1107  BP: 155/89  Pulse: 77  Temp: 99.4 F (37.4 C)    GENERAL:alert, no distress, well nourished, well developed, comfortable, cooperative, obese and smiling SKIN: skin color, texture, turgor are normal, no rashes or significant lesions HEAD: Normocephalic, No masses, lesions, tenderness or abnormalities EYES: normal, Conjunctiva are pink and non-injected EARS: External ears normal OROPHARYNX:lips, buccal mucosa, and tongue normal and mucous membranes are moist  NECK: supple, trachea midline LYMPH:  not  examined BREAST:not examined LUNGS: not examined HEART: not examined ABDOMEN:obese BACK: Back symmetric, no curvature. EXTREMITIES:less then 2 second capillary refill, no skin discoloration  NEURO: alert & oriented x 3 with fluent speech, no focal motor/sensory deficits     ASSESSMENT:  1. IgG lambda multiple myeloma, actually with 2 M-spikes in the past, presented initially in 1997 with an monoclonal gammopathy of undetermined significance.  2. Anemia 3. Renal Failure, improved.  4. Chronic lower extremity leg edema.  5. Peripheral neuropathy, grade 1-2, still with significant pain in a stocking distribution after treatment with Revlimid.  6. Revlimid-induced skin toxicity with contraction of the skin of the legs and feet.  7. Abdominal pain, aching in nature, epigastric in location, not associated with changes by use of meals or medications other than her pain medication takes it away for 15 minutes and then it is back. We also gave her a GI cocktail this morning, took it away for about 15 minutes and it is back. Negative Abdominal Ultrasound for gallstones.  8. Obesity.  9. Hyperparathyroidism, which may be secondary, and is being evaluated by [name] presently.     PLAN:  1. I personally reviewed and went over laboratory results with the patient. 2. Will restart Aranesp 500 mcg for her anemia. 3. Lab work today: CBC diff, CMET, MM 4. Will pursue her Pomalyst therapy.  Anticiapte beginning this medication within the next three weeks. She will likely require payment assistance which may take a week or two.  5. Patient will return for weekly CBC while on the Pomalyst. This will begin in 3 weeks. 6. Return in 5 weeks for follow-up   All questions were answered. The patient knows to call the clinic with any problems, questions or concerns. We can certainly see the patient much sooner if necessary.   [name]

## 2011-09-29 ENCOUNTER — Telehealth (HOSPITAL_COMMUNITY): Payer: Self-pay | Admitting: Oncology

## 2011-09-29 LAB — KAPPA/LAMBDA LIGHT CHAINS
Kappa free light chain: 2.76 mg/dL — ABNORMAL HIGH (ref 0.33–1.94)
Kappa, lambda light chain ratio: 0.72 (ref 0.26–1.65)
Lambda free light chains: 3.81 mg/dL — ABNORMAL HIGH (ref 0.57–2.63)

## 2011-09-29 NOTE — Telephone Encounter (Signed)
[employer] HMO [phone] ?'D IF COV WAS EFFECTIVE PER AUTO CSR COV IS STILL ACTIVE. EFFT 04/25/10

## 2011-09-30 LAB — MULTIPLE MYELOMA PANEL, SERUM
Albumin ELP: 53.8 % — ABNORMAL LOW (ref 55.8–66.1)
Alpha-1-Globulin: 5.2 % — ABNORMAL HIGH (ref 2.9–4.9)
Alpha-2-Globulin: 11.8 % (ref 7.1–11.8)
IgA: 54 mg/dL — ABNORMAL LOW (ref 69–380)
IgG (Immunoglobin G), Serum: 1470 mg/dL (ref 690–1700)
IgM, Serum: 25 mg/dL — ABNORMAL LOW (ref 52–322)
Total Protein: 6.7 g/dL (ref 6.0–8.3)

## 2011-10-04 ENCOUNTER — Telehealth (HOSPITAL_COMMUNITY): Payer: Self-pay

## 2011-10-04 NOTE — Telephone Encounter (Signed)
Call from patient stating "I want to be evaluated to see if I am eligible to get a scooter to help me get around.  I called [employer] and they said I would have to be evaluated first to see if Medicare and Medicaid would cover it, but  I don't know who I need to be evaluated by."  Phone number obtained and [employer] contacted [phone]).  Per company, patient has to be evaluated by a Physical Therapist to see what her degree of mobility or lack of mobility is and appointment needs to be scheduled at a time that someone from their company could also be present preferably in a couple of weeks.

## 2011-10-04 NOTE — Telephone Encounter (Signed)
Set up with PT

## 2011-10-05 ENCOUNTER — Telehealth (HOSPITAL_COMMUNITY): Payer: Self-pay

## 2011-10-05 NOTE — Telephone Encounter (Signed)
Discussed with [employer] process needed regarding getting an evaluation to be eligible for a scooter for mobility and she does not want to pursue at this time.  She will let us know if she changes her mind.

## 2011-10-07 ENCOUNTER — Telehealth (HOSPITAL_COMMUNITY): Payer: Self-pay | Admitting: Oncology

## 2011-10-10 ENCOUNTER — Telehealth (HOSPITAL_COMMUNITY): Payer: Self-pay | Admitting: Oncology

## 2011-10-18 ENCOUNTER — Encounter (HOSPITAL_BASED_OUTPATIENT_CLINIC_OR_DEPARTMENT_OTHER): Payer: Medicare HMO

## 2011-10-18 ENCOUNTER — Encounter (HOSPITAL_COMMUNITY): Payer: Medicare HMO

## 2011-10-18 DIAGNOSIS — C9000 Multiple myeloma not having achieved remission: Secondary | ICD-10-CM

## 2011-10-18 LAB — DIFFERENTIAL
Basophils Absolute: 0 10*3/uL (ref 0.0–0.1)
Basophils Relative: 0 % (ref 0–1)
Eosinophils Absolute: 0.2 10*3/uL (ref 0.0–0.7)
Monocytes Relative: 9 % (ref 3–12)
Neutro Abs: 1.5 10*3/uL — ABNORMAL LOW (ref 1.7–7.7)
Neutrophils Relative %: 50 % (ref 43–77)

## 2011-10-18 LAB — CBC
Hemoglobin: 11.1 g/dL — ABNORMAL LOW (ref 12.0–15.0)
MCH: 29 pg (ref 26.0–34.0)
MCHC: 31.4 g/dL (ref 30.0–36.0)
Platelets: 210 10*3/uL (ref 150–400)
RDW: 13.8 % (ref 11.5–15.5)

## 2011-10-18 MED ORDER — HEPARIN SOD (PORK) LOCK FLUSH 100 UNIT/ML IV SOLN
INTRAVENOUS | Status: AC
  Filled 2011-10-18: qty 5

## 2011-10-18 MED ORDER — HEPARIN SOD (PORK) LOCK FLUSH 100 UNIT/ML IV SOLN
500.0000 [IU] | Freq: Once | INTRAVENOUS | Status: AC
  Administered 2011-10-18: 500 [IU] via INTRAVENOUS
  Filled 2011-10-18: qty 5

## 2011-10-18 MED ORDER — SODIUM CHLORIDE 0.9 % IJ SOLN
10.0000 mL | INTRAMUSCULAR | Status: DC | PRN
  Administered 2011-10-18: 10 mL via INTRAVENOUS
  Filled 2011-10-18: qty 10

## 2011-10-18 MED ORDER — SODIUM CHLORIDE 0.9 % IJ SOLN
INTRAMUSCULAR | Status: AC
  Filled 2011-10-18: qty 10

## 2011-10-18 NOTE — Progress Notes (Signed)
Specimen obtained from port for lab. Port flushed per protocol.  Tolerated well. Aranesp injection held today. HGB>11.

## 2011-10-19 ENCOUNTER — Telehealth (HOSPITAL_COMMUNITY): Payer: Self-pay | Admitting: *Deleted

## 2011-10-19 ENCOUNTER — Other Ambulatory Visit (HOSPITAL_COMMUNITY): Payer: Medicare HMO

## 2011-10-19 ENCOUNTER — Ambulatory Visit (HOSPITAL_COMMUNITY): Payer: Medicare HMO

## 2011-10-19 NOTE — Telephone Encounter (Signed)
Pt notified that she can take aleve 1 tid for 5-7 days and have her let us know what happens

## 2011-10-19 NOTE — Telephone Encounter (Signed)
Can pt take aleve for arthritis type pain?

## 2011-10-26 ENCOUNTER — Encounter (HOSPITAL_COMMUNITY): Payer: Medicare HMO | Attending: Oncology

## 2011-10-26 DIAGNOSIS — E7800 Pure hypercholesterolemia, unspecified: Secondary | ICD-10-CM | POA: Insufficient documentation

## 2011-10-26 DIAGNOSIS — L989 Disorder of the skin and subcutaneous tissue, unspecified: Secondary | ICD-10-CM | POA: Insufficient documentation

## 2011-10-26 DIAGNOSIS — C9000 Multiple myeloma not having achieved remission: Secondary | ICD-10-CM

## 2011-10-26 DIAGNOSIS — I10 Essential (primary) hypertension: Secondary | ICD-10-CM | POA: Insufficient documentation

## 2011-10-26 DIAGNOSIS — Z85038 Personal history of other malignant neoplasm of large intestine: Secondary | ICD-10-CM | POA: Insufficient documentation

## 2011-10-26 DIAGNOSIS — R1013 Epigastric pain: Secondary | ICD-10-CM | POA: Insufficient documentation

## 2011-10-26 DIAGNOSIS — G609 Hereditary and idiopathic neuropathy, unspecified: Secondary | ICD-10-CM | POA: Insufficient documentation

## 2011-10-26 DIAGNOSIS — R32 Unspecified urinary incontinence: Secondary | ICD-10-CM | POA: Insufficient documentation

## 2011-10-26 DIAGNOSIS — K219 Gastro-esophageal reflux disease without esophagitis: Secondary | ICD-10-CM | POA: Insufficient documentation

## 2011-10-26 DIAGNOSIS — E119 Type 2 diabetes mellitus without complications: Secondary | ICD-10-CM | POA: Insufficient documentation

## 2011-10-26 DIAGNOSIS — N19 Unspecified kidney failure: Secondary | ICD-10-CM | POA: Insufficient documentation

## 2011-10-26 DIAGNOSIS — E669 Obesity, unspecified: Secondary | ICD-10-CM | POA: Insufficient documentation

## 2011-10-26 DIAGNOSIS — E213 Hyperparathyroidism, unspecified: Secondary | ICD-10-CM | POA: Insufficient documentation

## 2011-10-26 DIAGNOSIS — I2510 Atherosclerotic heart disease of native coronary artery without angina pectoris: Secondary | ICD-10-CM | POA: Insufficient documentation

## 2011-10-26 LAB — CBC
Hemoglobin: 11 g/dL — ABNORMAL LOW (ref 12.0–15.0)
MCH: 28.4 pg (ref 26.0–34.0)
Platelets: 197 10*3/uL (ref 150–400)
RBC: 3.87 MIL/uL (ref 3.87–5.11)
WBC: 4.8 10*3/uL (ref 4.0–10.5)

## 2011-10-26 LAB — COMPREHENSIVE METABOLIC PANEL
AST: 16 U/L (ref 0–37)
CO2: 25 mEq/L (ref 19–32)
Chloride: 104 mEq/L (ref 96–112)
Creatinine, Ser: 1.29 mg/dL — ABNORMAL HIGH (ref 0.50–1.10)
GFR calc Af Amer: 48 mL/min — ABNORMAL LOW (ref >90)
GFR calc non Af Amer: 41 mL/min — ABNORMAL LOW (ref >90)
Glucose, Bld: 91 mg/dL (ref 70–99)
Total Bilirubin: 0.2 mg/dL — ABNORMAL LOW (ref 0.3–1.2)

## 2011-10-26 LAB — DIFFERENTIAL
Eosinophils Absolute: 0.4 10*3/uL (ref 0.0–0.7)
Lymphs Abs: 1 10*3/uL (ref 0.7–4.0)
Monocytes Relative: 8 % (ref 3–12)
Neutro Abs: 2.9 10*3/uL (ref 1.7–7.7)
Neutrophils Relative %: 61 % (ref 43–77)

## 2011-10-26 MED ORDER — HEPARIN SOD (PORK) LOCK FLUSH 100 UNIT/ML IV SOLN
INTRAVENOUS | Status: AC
  Filled 2011-10-26: qty 5

## 2011-10-26 MED ORDER — SODIUM CHLORIDE 0.9 % IJ SOLN
10.0000 mL | INTRAMUSCULAR | Status: AC | PRN
  Administered 2011-10-26: 10 mL via INTRAVENOUS
  Filled 2011-10-26: qty 10

## 2011-10-26 MED ORDER — SODIUM CHLORIDE 0.9 % IJ SOLN
INTRAMUSCULAR | Status: AC
  Filled 2011-10-26: qty 10

## 2011-10-26 MED ORDER — HEPARIN SOD (PORK) LOCK FLUSH 100 UNIT/ML IV SOLN
500.0000 [IU] | Freq: Once | INTRAVENOUS | Status: AC
  Administered 2011-10-26: 500 [IU] via INTRAVENOUS
  Filled 2011-10-26: qty 5

## 2011-10-28 LAB — KAPPA/LAMBDA LIGHT CHAINS: Kappa, lambda light chain ratio: 0.75 (ref 0.26–1.65)

## 2011-10-31 ENCOUNTER — Encounter: Payer: Self-pay | Admitting: Oncology

## 2011-10-31 LAB — MULTIPLE MYELOMA PANEL, SERUM
Albumin ELP: 52.7 % — ABNORMAL LOW (ref 55.8–66.1)
Beta 2: 5 % (ref 3.2–6.5)
Gamma Globulin: 21.2 % — ABNORMAL HIGH (ref 11.1–18.8)
IgG (Immunoglobin G), Serum: 1850 mg/dL — ABNORMAL HIGH (ref 690–1700)

## 2011-11-02 ENCOUNTER — Encounter (HOSPITAL_COMMUNITY): Payer: Medicare HMO

## 2011-11-02 ENCOUNTER — Encounter (HOSPITAL_COMMUNITY): Payer: Medicare HMO | Admitting: Oncology

## 2011-11-03 ENCOUNTER — Encounter (HOSPITAL_COMMUNITY): Payer: Medicare HMO

## 2011-11-03 ENCOUNTER — Encounter (HOSPITAL_BASED_OUTPATIENT_CLINIC_OR_DEPARTMENT_OTHER): Payer: Medicare HMO | Admitting: Oncology

## 2011-11-03 VITALS — BP 114/77 | HR 58 | Temp 98.5°F | Ht 65.0 in | Wt 206.0 lb

## 2011-11-03 DIAGNOSIS — R1013 Epigastric pain: Secondary | ICD-10-CM

## 2011-11-03 DIAGNOSIS — G609 Hereditary and idiopathic neuropathy, unspecified: Secondary | ICD-10-CM

## 2011-11-03 DIAGNOSIS — D649 Anemia, unspecified: Secondary | ICD-10-CM

## 2011-11-03 DIAGNOSIS — C9000 Multiple myeloma not having achieved remission: Secondary | ICD-10-CM

## 2011-11-03 LAB — CBC
HCT: 33 % — ABNORMAL LOW (ref 36.0–46.0)
Hemoglobin: 10.2 g/dL — ABNORMAL LOW (ref 12.0–15.0)
WBC: 3.9 10*3/uL — ABNORMAL LOW (ref 4.0–10.5)

## 2011-11-03 LAB — DIFFERENTIAL
Basophils Relative: 1 % (ref 0–1)
Eosinophils Relative: 17 % — ABNORMAL HIGH (ref 0–5)
Lymphocytes Relative: 28 % (ref 12–46)
Monocytes Relative: 11 % (ref 3–12)
Neutro Abs: 1.7 10*3/uL (ref 1.7–7.7)

## 2011-11-03 MED ORDER — HEPARIN SOD (PORK) LOCK FLUSH 100 UNIT/ML IV SOLN
INTRAVENOUS | Status: AC
  Filled 2011-11-03: qty 5

## 2011-11-03 MED ORDER — HEPARIN SOD (PORK) LOCK FLUSH 100 UNIT/ML IV SOLN
500.0000 [IU] | Freq: Once | INTRAVENOUS | Status: AC
  Administered 2011-11-03: 500 [IU] via INTRAVENOUS
  Filled 2011-11-03: qty 5

## 2011-11-03 MED ORDER — SODIUM CHLORIDE 0.9 % IJ SOLN
10.0000 mL | INTRAMUSCULAR | Status: DC | PRN
  Administered 2011-11-03: 10 mL via INTRAVENOUS
  Filled 2011-11-03: qty 10

## 2011-11-03 MED ORDER — SODIUM CHLORIDE 0.9 % IJ SOLN
INTRAMUSCULAR | Status: AC
  Filled 2011-11-03: qty 20

## 2011-11-03 NOTE — Progress Notes (Signed)
Diane Stalling, MD [address] [place] [address]  1. MULTIPLE  MYELOMA  furosemide (LASIX) 20 MG tablet, Vitamin D, Ergocalciferol, (DRISDOL) 50000 UNITS CAPS, diphenhydramine-acetaminophen (TYLENOL PM) 25-500 MG TABS, CBC, Differential, Comprehensive metabolic panel, Multiple myeloma panel, serum, Kappa/lambda light chains, CBC, Differential, CBC, Differential    CURRENT THERAPY: Pomalyst daily 21 out of 28 days and Decadron 20 mg weekly  INTERVAL HISTORY: [name] 71 y.o. female returns for  regular  visit for followup of  IgG lambda multiple myeloma, actually with 2 M-spikes in the past, presented initially in 1997 with an monoclonal gammopathy of undetermined significance.   She is doing well.  Her only complaint is epigastric abdominal pain.  Her present regimen of Prilosec is effective for a few hours, but then in mid-day she begins to have the pain.  She reports that it feels like a headache in her abdomen.  She denies any burning.  She reports that it is not increased with laying flat.  She reports that when she has the pain, she takes 2 large swallows of liquid and the pain goes away for 2 1/2 hours, but then returns.  We have tried Dexilant, Nexium, Carafate, and GI cocktail in the past which has not been beneficial for the patient.  So we will increase to Prilosec 20 mg TID and see if that is of help to her.   We spent some time reviewing her chemotherapy plan.  There was some confusion regarding her Decadron and she does not think she is taking it.  I have asked our nurse to provide her written instructions.  The instructions on the e-scribed Rx is correct and that was sent to [place], [place].  Our nurse will look into it.  She should take 20 mg of Decadron weekly, PO.  We had a nice encounter today and [name] is her pleasant self.  She is accompanied by her daughter today.  She is please to be on oral agents for her multiple myeloma.  Other than her abdominal pain,  she denies any complaints.   Past Medical History  Diagnosis Date  . Essential hypertension, benign   . Coronary atherosclerosis of native coronary artery     Former patient of [facility] - PTCA apical LAD 2005  . Type 2 diabetes mellitus     Borderline  . GERD (gastroesophageal reflux disease)   . Hypercholesteremia   . Female incontinence   . UTI (urinary tract infection)   . Vertigo   . Poor sleep pattern   . Colon cancer   . Multiple myeloma     has MULTIPLE  MYELOMA; HYPERLIPIDEMIA; Essential hypertension, benign; Coronary atherosclerosis of native coronary artery; Edema leg; HX, PERSONAL, MALIGNANCY, COLON; Vertigo; and Palpitations on her problem list.     is allergic to gabapentin and simvastatin.  [name] does not currently have medications on file.  Past Surgical History  Procedure Date  . Colon surgery     Resection, [facility]  . Abdominal hysterectomy 1970s    [facility]  . Knee arthroscopy     LEFT, [facility]  . Heel spur surgery     KEELING, [facility]  . Portacath placement     [facility]   . Cervical spine surgery 2004    [facility]  . Port-a-cath removal 11/05/2010    Procedure: REMOVAL PORT-A-CATH;  Surgeon: Dalia Heading;  Location: [facility];  Service: General;  Laterality: Left;  started at 0846  . Portacath placement 11/05/2010  Procedure: INSERTION PORT-A-CATH;  Surgeon: Dalia Heading;  Location: [facility];  Service: General;  Laterality: Right;  Right subclavian, device left accessed     Denies any headaches, dizziness, double vision, fevers, chills, night sweats, nausea, vomiting, diarrhea, constipation, chest pain, heart palpitations, shortness of breath, blood in stool, black tarry stool, urinary pain, urinary burning, urinary frequency, hematuria.   PHYSICAL EXAMINATION  ECOG PERFORMANCE STATUS: 2 - Symptomatic, <50% confined to bed  Filed Vitals:   11/03/11 1121  BP: 114/77  Pulse: 58  Temp: 98.5 F (36.9 C)    GENERAL:alert, no distress, comfortable, cooperative, obese and  smiling SKIN: skin color, texture, turgor are normal, no rashes or significant lesions HEAD: Normocephalic, No masses, lesions, tenderness or abnormalities EYES: normal, PERRLA, EOMI, Conjunctiva are pink and non-injected EARS: External ears normal OROPHARYNX:lips, buccal mucosa, and tongue normal and mucous membranes are moist  NECK: supple, trachea midline LYMPH:  no palpable lymphadenopathy BREAST:not examined LUNGS: clear to auscultation and percussion HEART: regular rate & rhythm, no murmurs, no gallops, S1 normal and S2 normal ABDOMEN:abdomen soft, non-tender, obese, normal bowel sounds and no epigastric discomfort on palpation. BACK: Back symmetric, no curvature. EXTREMITIES:less then 2 second capillary refill, no joint deformities, effusion, or inflammation, no edema, no skin discoloration, no clubbing, no cyanosis  NEURO: alert & oriented x 3 with fluent speech, no focal motor/sensory deficits, gait normal   LABORATORY DATA: CBC    Component Value Date/Time   WBC 4.8 10/26/2011 1021   RBC 3.87 10/26/2011 1021   HGB 11.0* 10/26/2011 1021   HCT 35.7* 10/26/2011 1021   PLT 197 10/26/2011 1021   MCV 92.2 10/26/2011 1021   MCH 28.4 10/26/2011 1021   MCHC 30.8 10/26/2011 1021   RDW 13.7 10/26/2011 1021   LYMPHSABS 1.0 10/26/2011 1021   MONOABS 0.4 10/26/2011 1021   EOSABS 0.4 10/26/2011 1021   BASOSABS 0.1 10/26/2011 1021      Chemistry      Component Value Date/Time   NA 138 10/26/2011 1059   K 3.9 10/26/2011 1059   CL 104 10/26/2011 1059   CO2 25 10/26/2011 1059   BUN 14 10/26/2011 1059   CREATININE 1.29* 10/26/2011 1059   CREATININE 104.89* 03/04/2010 0936      Component Value Date/Time   CALCIUM 9.7 10/26/2011 1059   CALCIUM 10.4 08/19/2011 1105   ALKPHOS 39 10/26/2011 1059   AST 16 10/26/2011 1059   ALT 9 10/26/2011 1059   BILITOT 0.2* 10/26/2011 1059      Results for Diane, Ray (MRN [id number]) as of 11/03/2011 12:42  Ref. Range 10/26/2011 10:59 10/26/2011 11:04  Albumin ELP Latest Range: 55.8-66.1  %  52.7 (L)  Alpha-1-Globulin Latest Range: 2.9-4.9 %  5.0 (H)  Alpha-2-Globulin Latest Range: 7.1-11.8 %  11.5  Beta Globulin Latest Range: 4.7-7.2 %  4.6 (L)  Beta 2 Latest Range: 3.2-6.5 %  5.0  Gamma Globulin Latest Range: 11.1-18.8 %  21.2 (H)  M-SPIKE, % No range found  0.41  SPE Interp. No range found  (NOTE)  Comment No range found  (NOTE)  IgG (Immunoglobin G), Serum Latest Range: [phone] mg/dL  2952 (H)  IgA Latest Range: 69-380 mg/dL  73  IgM, Serum Latest Range: 52-322 mg/dL  50 (L)  Kappa free light chain Latest Range: 0.33-1.94 mg/dL 8.41 (H)   Lamda free light chains Latest Range: 0.57-2.63 mg/dL 3.24 (H)   Kappa, lamda light chain ratio Latest Range: 0.26-1.65  0.75  ASSESSMENT:  1. IgG lambda multiple myeloma, actually with 2 M-spikes in the past, presented initially in 1997 with an monoclonal gammopathy of undetermined significance.  2. Anemia, improved. 3. Renal Failure, improved.  4. Chronic lower extremity leg edema, improved.  5. Peripheral neuropathy, grade 1-2, still with significant pain in a stocking distribution after treatment with Revlimid.  6. Revlimid-induced skin toxicity with contraction of the skin of the legs and feet.  7. Abdominal pain, aching in nature, epigastric in location, not associated with changes by use of meals or medications other than her pain medication takes it away for 15 minutes and then it is back. We also gave her a GI cocktail in past without effectiveness. Negative Abdominal Ultrasound for gallstones.  8. Obesity.  9. Hyperparathyroidism, which may be secondary, and is being evaluated by Dr. Kristian Covey.    PLAN:  1. I personally reviewed and went over laboratory results with the patient. 2. Lab work today: CBC diff 3. Lab work end of July: CBC diff, CMET, MM panel. 4. Every 2 weeks: CBC diff 5. Continue with Pomalyst.  Patient is completing cycle 2. 6. Patient is to contact [facility] for refills of Pomalyst.  She will do  this today.  7. Patient education regarding Pomalyst (21/28 days) and decadron weekly.  8. Increase Prilosec to TID dosing.   9. Return for lab work as scheduled and return for follow-up in 8 weeks.    All questions were answered. The patient knows to call the clinic with any problems, questions or concerns. We can certainly see the patient much sooner if necessary.   Satia Winger

## 2011-11-03 NOTE — Progress Notes (Signed)
[name] presented for Portacath access and flush. Proper placement of portacath confirmed by CXR. Portacath located rt  chest wall accessed with  H 20 needle. Good blood return present and cbc diff drawn. Portacath flushed with 20ml NS and 500U/44ml Heparin and needle removed intact. Procedure without incident. Patient tolerated procedure well.

## 2011-11-03 NOTE — Patient Instructions (Addendum)
[facility]  Discharge Instructions MARNY SMETHERS  [id number] Jun 27, 1940 Dr. Glenford Peers  RECOMMENDATIONS MADE BY THE CONSULTANT AND ANY TEST RESULTS WILL BE SENT TO YOUR REFERRING DOCTOR.   EXAM FINDINGS BY MD TODAY AND SIGNS AND SYMPTOMS TO REPORT TO CLINIC OR PRIMARY MD:  You are doing well   MEDICATIONS PRESCRIBED:  Increase omeperazole to 3 times a day Dexamethasone 5 pills once a week--you pick the day [facility] about your polmalidomide and they will contact us  INSTRUCTIONS GIVEN AND DISCUSSED:  Labs this week and next week SPECIAL INSTRUCTIONS/FOLLOW-UP: 2 months to see Neijstrom   I acknowledge that I have been informed and understand all the instructions given to me and received a copy. I do not have any more questions at this time, but understand that I may call the Specialty Clinic at [facility] at [phone] during business hours should I have any further questions or need assistance in obtaining follow-up care.    __________________________________________  _____________  __________ Signature of Patient or Authorized Representative            Date                   Time    __________________________________________ Nurse's Signature

## 2011-11-07 ENCOUNTER — Other Ambulatory Visit (HOSPITAL_COMMUNITY): Payer: Self-pay | Admitting: Oncology

## 2011-11-07 DIAGNOSIS — C9000 Multiple myeloma not having achieved remission: Secondary | ICD-10-CM

## 2011-11-07 MED ORDER — POMALIDOMIDE 4 MG PO CAPS: 4.0000 mg | ORAL_CAPSULE | Freq: Every day | ORAL | Status: DC

## 2011-11-09 ENCOUNTER — Encounter (HOSPITAL_BASED_OUTPATIENT_CLINIC_OR_DEPARTMENT_OTHER): Payer: Medicare HMO

## 2011-11-09 DIAGNOSIS — C9000 Multiple myeloma not having achieved remission: Secondary | ICD-10-CM

## 2011-11-09 LAB — CBC
MCH: 28.2 pg (ref 26.0–34.0)
MCHC: 30.9 g/dL (ref 30.0–36.0)
Platelets: 238 10*3/uL (ref 150–400)
RBC: 3.72 MIL/uL — ABNORMAL LOW (ref 3.87–5.11)
RDW: 14.6 % (ref 11.5–15.5)

## 2011-11-09 LAB — DIFFERENTIAL
Basophils Absolute: 0 10*3/uL (ref 0.0–0.1)
Basophils Relative: 2 % — ABNORMAL HIGH (ref 0–1)
Eosinophils Absolute: 0.5 10*3/uL (ref 0.0–0.7)
Neutrophils Relative %: 36 % — ABNORMAL LOW (ref 43–77)

## 2011-11-09 NOTE — Progress Notes (Signed)
Labs drawn today for cbc/diff 

## 2011-11-16 ENCOUNTER — Encounter (HOSPITAL_COMMUNITY): Payer: Medicare HMO

## 2011-11-16 DIAGNOSIS — C9000 Multiple myeloma not having achieved remission: Secondary | ICD-10-CM

## 2011-11-16 DIAGNOSIS — N3281 Overactive bladder: Secondary | ICD-10-CM

## 2011-11-16 LAB — DIFFERENTIAL
Basophils Absolute: 0.1 10*3/uL (ref 0.0–0.1)
Lymphocytes Relative: 38 % (ref 12–46)
Lymphs Abs: 1.7 10*3/uL (ref 0.7–4.0)
Neutrophils Relative %: 30 % — ABNORMAL LOW (ref 43–77)

## 2011-11-16 LAB — CBC
Platelets: 303 10*3/uL (ref 150–400)
RBC: 3.63 MIL/uL — ABNORMAL LOW (ref 3.87–5.11)
WBC: 4.4 10*3/uL (ref 4.0–10.5)

## 2011-11-16 MED ORDER — OXYBUTYNIN CHLORIDE 5 MG PO TABS: 5.0000 mg | ORAL_TABLET | Freq: Every day | ORAL | Status: DC

## 2011-11-16 NOTE — Progress Notes (Unsigned)
Labs drawn today for cbc/diff 

## 2011-11-18 ENCOUNTER — Encounter: Payer: Self-pay | Admitting: Oncology

## 2011-11-23 ENCOUNTER — Encounter (HOSPITAL_BASED_OUTPATIENT_CLINIC_OR_DEPARTMENT_OTHER): Payer: Medicare HMO

## 2011-11-23 DIAGNOSIS — C9000 Multiple myeloma not having achieved remission: Secondary | ICD-10-CM

## 2011-11-23 LAB — CBC
HCT: 32.8 % — ABNORMAL LOW (ref 36.0–46.0)
Hemoglobin: 10.6 g/dL — ABNORMAL LOW (ref 12.0–15.0)
MCHC: 32.3 g/dL (ref 30.0–36.0)
WBC: 8.8 10*3/uL (ref 4.0–10.5)

## 2011-11-23 LAB — DIFFERENTIAL
Basophils Absolute: 0 10*3/uL (ref 0.0–0.1)
Basophils Relative: 0 % (ref 0–1)
Eosinophils Absolute: 0.4 10*3/uL (ref 0.0–0.7)
Lymphocytes Relative: 8 % — ABNORMAL LOW (ref 12–46)
Monocytes Relative: 2 % — ABNORMAL LOW (ref 3–12)
Neutro Abs: 7.5 10*3/uL (ref 1.7–7.7)
Neutrophils Relative %: 85 % — ABNORMAL HIGH (ref 43–77)

## 2011-11-23 LAB — COMPREHENSIVE METABOLIC PANEL
ALT: 8 U/L (ref 0–35)
AST: 12 U/L (ref 0–37)
Alkaline Phosphatase: 44 U/L (ref 39–117)
CO2: 22 mEq/L (ref 19–32)
Calcium: 9.4 mg/dL (ref 8.4–10.5)
Potassium: 3.8 mEq/L (ref 3.5–5.1)
Sodium: 139 mEq/L (ref 135–145)
Total Protein: 7 g/dL (ref 6.0–8.3)

## 2011-11-23 NOTE — Progress Notes (Signed)
Labs drawn today for cbc/odff,cmp,mm panel,kllc

## 2011-11-24 LAB — KAPPA/LAMBDA LIGHT CHAINS
Kappa free light chain: 1.55 mg/dL (ref 0.33–1.94)
Kappa, lambda light chain ratio: 0.31 (ref 0.26–1.65)
Lambda free light chains: 4.94 mg/dL — ABNORMAL HIGH (ref 0.57–2.63)

## 2011-11-25 LAB — MULTIPLE MYELOMA PANEL, SERUM
Alpha-1-Globulin: 5.1 % — ABNORMAL HIGH (ref 2.9–4.9)
Alpha-2-Globulin: 12.7 % — ABNORMAL HIGH (ref 7.1–11.8)
Beta 2: 4.5 % (ref 3.2–6.5)
Gamma Globulin: 17.6 % (ref 11.1–18.8)
M-Spike, %: 0.31 g/dL

## 2011-12-05 ENCOUNTER — Other Ambulatory Visit (HOSPITAL_COMMUNITY): Payer: Self-pay | Admitting: Oncology

## 2011-12-05 DIAGNOSIS — C9000 Multiple myeloma not having achieved remission: Secondary | ICD-10-CM

## 2011-12-05 MED ORDER — POMALIDOMIDE 4 MG PO CAPS: 4.0000 mg | ORAL_CAPSULE | Freq: Every day | ORAL | Status: DC

## 2011-12-07 ENCOUNTER — Other Ambulatory Visit (HOSPITAL_COMMUNITY): Payer: Self-pay | Admitting: Oncology

## 2011-12-07 ENCOUNTER — Encounter (HOSPITAL_COMMUNITY): Payer: Medicare HMO | Attending: Oncology

## 2011-12-07 ENCOUNTER — Other Ambulatory Visit (HOSPITAL_COMMUNITY): Payer: Medicare HMO

## 2011-12-07 DIAGNOSIS — Z9889 Other specified postprocedural states: Secondary | ICD-10-CM | POA: Insufficient documentation

## 2011-12-07 DIAGNOSIS — Z95828 Presence of other vascular implants and grafts: Secondary | ICD-10-CM

## 2011-12-07 DIAGNOSIS — C9000 Multiple myeloma not having achieved remission: Secondary | ICD-10-CM | POA: Insufficient documentation

## 2011-12-07 DIAGNOSIS — I10 Essential (primary) hypertension: Secondary | ICD-10-CM

## 2011-12-07 DIAGNOSIS — Z452 Encounter for adjustment and management of vascular access device: Secondary | ICD-10-CM

## 2011-12-07 LAB — CBC
MCV: 91.1 fL (ref 78.0–100.0)
Platelets: 221 10*3/uL (ref 150–400)
RDW: 17.5 % — ABNORMAL HIGH (ref 11.5–15.5)
WBC: 5 10*3/uL (ref 4.0–10.5)

## 2011-12-07 LAB — DIFFERENTIAL
Basophils Absolute: 0 10*3/uL (ref 0.0–0.1)
Eosinophils Relative: 36 % — ABNORMAL HIGH (ref 0–5)
Lymphocytes Relative: 25 % (ref 12–46)
Neutro Abs: 1.3 10*3/uL — ABNORMAL LOW (ref 1.7–7.7)
Neutrophils Relative %: 25 % — ABNORMAL LOW (ref 43–77)

## 2011-12-07 MED ORDER — NEBIVOLOL HCL 5 MG PO TABS: 5.0000 mg | ORAL_TABLET | Freq: Three times a day (TID) | ORAL | Status: DC

## 2011-12-07 MED ORDER — HEPARIN SOD (PORK) LOCK FLUSH 100 UNIT/ML IV SOLN
500.0000 [IU] | Freq: Once | INTRAVENOUS | Status: AC
  Administered 2011-12-07: 500 [IU] via INTRAVENOUS
  Filled 2011-12-07: qty 5

## 2011-12-07 MED ORDER — SODIUM CHLORIDE 0.9 % IJ SOLN
10.0000 mL | INTRAMUSCULAR | Status: DC | PRN
  Administered 2011-12-07: 10 mL via INTRAVENOUS
  Filled 2011-12-07: qty 10

## 2011-12-07 MED ORDER — SODIUM CHLORIDE 0.9 % IJ SOLN
INTRAMUSCULAR | Status: AC
  Filled 2011-12-07: qty 10

## 2011-12-07 MED ORDER — HEPARIN SOD (PORK) LOCK FLUSH 100 UNIT/ML IV SOLN
INTRAVENOUS | Status: AC
  Filled 2011-12-07: qty 5

## 2011-12-07 NOTE — Progress Notes (Signed)
[name] presented for Portacath access and flush. Proper placement of portacath confirmed by CXR. Portacath located rt chest wall accessed with  H 20 needle. Good blood return present and labs drawn for cbc. Portacath flushed with 20ml NS and 500U/41ml Heparin and needle removed intact. Procedure without incident. Patient tolerated procedure well.

## 2011-12-09 ENCOUNTER — Ambulatory Visit (INDEPENDENT_AMBULATORY_CARE_PROVIDER_SITE_OTHER): Payer: Medicare HMO | Admitting: Adult Health

## 2011-12-09 ENCOUNTER — Encounter (HOSPITAL_BASED_OUTPATIENT_CLINIC_OR_DEPARTMENT_OTHER): Payer: Medicare HMO

## 2011-12-09 ENCOUNTER — Encounter: Payer: Self-pay | Admitting: Adult Health

## 2011-12-09 VITALS — BP 155/76 | HR 48 | Ht 65.0 in | Wt 222.0 lb

## 2011-12-09 DIAGNOSIS — I10 Essential (primary) hypertension: Secondary | ICD-10-CM

## 2011-12-09 DIAGNOSIS — D649 Anemia, unspecified: Secondary | ICD-10-CM

## 2011-12-09 DIAGNOSIS — R002 Palpitations: Secondary | ICD-10-CM

## 2011-12-09 DIAGNOSIS — C9000 Multiple myeloma not having achieved remission: Secondary | ICD-10-CM

## 2011-12-09 MED ORDER — DARBEPOETIN ALFA-POLYSORBATE 500 MCG/ML IJ SOLN
500.0000 ug | Freq: Once | INTRAMUSCULAR | Status: AC
  Administered 2011-12-09: 500 ug via SUBCUTANEOUS

## 2011-12-09 MED ORDER — DARBEPOETIN ALFA-POLYSORBATE 500 MCG/ML IJ SOLN
INTRAMUSCULAR | Status: AC
  Filled 2011-12-09: qty 1

## 2011-12-09 NOTE — Assessment & Plan Note (Signed)
She offers no further complaints of palpitations. They appear to be quiesced and with the use of diastolic.

## 2011-12-09 NOTE — Assessment & Plan Note (Signed)
Blood pressure is much better controlled on medication regimen. She is having what I believed to be mild orthostatic hypotension which seems to pass once she has waited a few minutes to proceed from a sitting to standing position. I have offered to do orthostatics but she states she is in a hurry and needs to be at another appointment and does not wish to wait to have these completed. At this time we will watch and wait. I have again advised her to continue to do what she is doing by waiting before leaving a supine or sitting position to standing position to avoid falling. Of note is that she is mildly bradycardic on this visit, and may need to consider backing off on bystolic dose if necessary. I will see her again in a couple months for further evaluation. Can consider placing a cardiac monitor on this patient.

## 2011-12-09 NOTE — Patient Instructions (Signed)
Your physician recommends that you schedule a follow-up appointment in: 3 months.  

## 2011-12-09 NOTE — Progress Notes (Signed)
HPI [name] is a 71 year old patient of Dr. Diona Browner we are seeing for ongoing assessment treatment of hypertension palpitations and shortness of breath. On last visit she was found to be very hypertensive with medications adjusted on that visit. She is here for six-month followup. Review of past office visit noted that she did have an episode of nonsustained a trigger tachycardia with a rate of 153 beats per minute on that visit we increased her by systolic to 10 mg in a.m. and 5 mg in the p.m. She was hypertensive on that visit with a blood pressure of 192/90 and Diovan was increased to 320 mg daily.    She comes today essentially without complaint with the exception of near syncope with position changes. She is with her daughter who does verify that when she sits up in the bed she gets lightheaded for a couple of seconds and gets lightheaded, and ays back down. She states that she feels presyncopal. This also occurs when she bends over, or when she gets up from a sitting to standing position. If she stands in the position for a short period of time the sensation passes. She denies any palpitations, chest pain, or shortness of breath. Allergies  Allergen Reactions  . Gabapentin Other (See Comments)    Difficulty with speech  . Simvastatin     REACTION: leg cramps    Current Outpatient Prescriptions  Medication Sig Dispense Refill  . aspirin 81 MG EC tablet Take 81 mg by mouth every morning.        . Bisacodyl (DULCOLAX PO) Take 2 tablets by mouth at bedtime as needed and may repeat dose one time if needed.      Marland Kitchen DIOVAN 160 MG tablet Take 1 tablet by mouth 2 (two) times daily.      . diphenhydramine-acetaminophen (TYLENOL PM) 25-500 MG TABS Take 1 tablet by mouth at bedtime as needed.      . furosemide (LASIX) 20 MG tablet Take 20 mg by mouth as needed.      . nebivolol (BYSTOLIC) 5 MG tablet Take by mouth. Take 10 mg in the am and take 2.5 mg in the pm by mouth daily.      Marland Kitchen nystatin  cream (MYCOSTATIN) Apply topically 2 (two) times daily.  30 g  3  . omeprazole (PRILOSEC) 20 MG capsule Take 20 mg by mouth 2 (two) times daily.       Marland Kitchen oxybutynin (DITROPAN) 5 MG tablet Take 1 tablet (5 mg total) by mouth daily.  30 tablet  3  . oxyCODONE-acetaminophen (PERCOCET) 5-325 MG per tablet Take 1 tablet by mouth every 6 (six) hours as needed. pain  45 tablet  0  . pomalidomide (POMALYST) 4 MG capsule Take 1 capsule (4 mg total) by mouth daily.  21 capsule  1  . potassium chloride SA (K-DUR,KLOR-CON) 20 MEQ tablet Take 20 mEq by mouth daily.       . rosuvastatin (CRESTOR) 20 MG tablet Take 20 mg by mouth at bedtime.        . Vitamin D, Ergocalciferol, (DRISDOL) 50000 UNITS CAPS Take 50,000 Units by mouth every 7 (seven) days.       No current facility-administered medications for this visit.   Facility-Administered Medications Ordered in Other Visits  Medication Dose Route Frequency Provider Last Rate Last Dose  . darbepoetin alfa-polysorbate (ARANESP) injection 500 mcg  500 mcg Subcutaneous Once Ellouise Newer, PA   500 mcg at 12/09/11 1431  .  sodium chloride 0.9 % injection 10 mL  10 mL Intravenous PRN Randall An, MD   10 mL at 10/26/11 1119    Past Medical History  Diagnosis Date  . Essential hypertension, benign   . Coronary atherosclerosis of native coronary artery     Former patient of [facility] - PTCA apical LAD 2005  . Type 2 diabetes mellitus     Borderline  . GERD (gastroesophageal reflux disease)   . Hypercholesteremia   . Female stress incontinence   . UTI (urinary tract infection)   . Vertigo   . Poor sleep pattern   . Colon cancer   . Multiple myeloma     Past Surgical History  Procedure Date  . Colon surgery     Resection, [facility]  . Abdominal hysterectomy 1970s    [facility]  . Knee arthroscopy     LEFT, [facility]  . Heel spur surgery     KEELING, [facility]  . Portacath placement     [facility]   . Cervical spine surgery 2004    [facility]  . Port-a-cath removal 11/05/2010     Procedure: REMOVAL PORT-A-CATH;  Surgeon: Dalia Heading;  Location: [facility];  Service: General;  Laterality: Left;  started at 0846  . Portacath placement 11/05/2010    Procedure: INSERTION PORT-A-CATH;  Surgeon: Dalia Heading;  Location: [facility];  Service: General;  Laterality: Right;  Right subclavian, device left accessed     NWG:NFAOZH of systems complete and found to be negative unless listed above  PHYSICAL EXAM BP 155/76  Pulse 48  Ht 5\' 5"  (1.651 m)  Wt 222 lb (100.699 kg)  BMI 36.94 kg/m2  General: Well developed, well nourished, in no acute distress Head: Eyes PERRLA, No xanthomas.   Normal cephalic and atramatic  Lungs: Clear bilaterally to auscultation and percussion. Heart: HRRR S1 S2, bradycardic without MRG.  Pulses are 2+ & equal.            No carotid bruit. No JVD.  No abdominal bruits. No femoral bruits. Abdomen: Bowel sounds are positive, abdomen soft and non-tender without masses or                  Hernia's noted. Msk:  Back normal, normal gait. Normal strength and tone for age. Extremities: No clubbing, cyanosis or edema.  DP +1 Neuro: Alert and oriented X 3. Psych:  Good affect, responds appropriately    ASSESSMENT AND PLAN

## 2011-12-09 NOTE — Progress Notes (Signed)
[name] presents today for injection per the provider's orders.  Aranesp administered administration without incident; see MAR for injection details.  Patient tolerated procedure well and without incident.  No questions or complaints noted at this time.

## 2011-12-14 ENCOUNTER — Ambulatory Visit: Payer: Medicare HMO | Admitting: Gastroenterology

## 2011-12-14 ENCOUNTER — Encounter: Payer: Self-pay | Admitting: Internal Medicine

## 2011-12-15 ENCOUNTER — Telehealth (HOSPITAL_COMMUNITY): Payer: Self-pay | Admitting: *Deleted

## 2011-12-15 ENCOUNTER — Other Ambulatory Visit: Payer: Self-pay | Admitting: Internal Medicine

## 2011-12-15 ENCOUNTER — Encounter: Payer: Self-pay | Admitting: Gastroenterology

## 2011-12-15 ENCOUNTER — Ambulatory Visit (INDEPENDENT_AMBULATORY_CARE_PROVIDER_SITE_OTHER): Payer: Medicare HMO | Admitting: Gastroenterology

## 2011-12-15 VITALS — BP 156/82 | HR 68 | Temp 98.0°F | Ht 65.0 in | Wt 214.4 lb

## 2011-12-15 DIAGNOSIS — R109 Unspecified abdominal pain: Secondary | ICD-10-CM | POA: Insufficient documentation

## 2011-12-15 DIAGNOSIS — K5900 Constipation, unspecified: Secondary | ICD-10-CM | POA: Insufficient documentation

## 2011-12-15 DIAGNOSIS — R1013 Epigastric pain: Secondary | ICD-10-CM

## 2011-12-15 DIAGNOSIS — D649 Anemia, unspecified: Secondary | ICD-10-CM | POA: Insufficient documentation

## 2011-12-15 DIAGNOSIS — Z85038 Personal history of other malignant neoplasm of large intestine: Secondary | ICD-10-CM

## 2011-12-15 DIAGNOSIS — K219 Gastro-esophageal reflux disease without esophagitis: Secondary | ICD-10-CM

## 2011-12-15 MED ORDER — PEG 3350-KCL-NA BICARB-NACL 420 G PO SOLR: 4000.0000 mL | ORAL | Status: AC

## 2011-12-15 NOTE — Progress Notes (Signed)
Primary Care Physician:  DONDIEGO,RICHARD M, MD  Primary Gastroenterologist:  Michael Rourk, MD   Chief Complaint  Patient presents with  . Abdominal Pain  . Gastrophageal Reflux    HPI:  [name] is a 71 y.o. female here for further evaluation of ongoing abdominal pain. She states she has had pain for years but worse the last one year. We have followed her previously for GERD, gastroparesis, colon cancer. She was last seen in 07/2008. She also gives remote h/o PUD.  She describes left-sided abdominal pain/luq/epigastric pain which occurs unrelated to meals. No nocturnal symptoms. May last for several hours at a time. May go weeks without pain. Drinking large "gulp" of water helps temporarily. Per Tom Kefalas, PA-C with Dr. Neijstrom, she has been on Dexilant, Nexium, Carafate, GI cocktail with little relief. Currently taking omeprazole 20mg BID. Patient reports being on omeprazole for long time. She takes aleve prn arthritis. Not daily. If Aleve doesn't help, then she takes oxycodone. She has frequent constipation. Takes dulcolax if not BM in 3 days. No melena, brbpr. Heartburn controlled. No n/v. Appetite good. No significant weight loss per patient.    Current Outpatient Prescriptions  Medication Sig Dispense Refill  . aspirin 81 MG EC tablet Take 81 mg by mouth every morning.        . Bisacodyl (DULCOLAX PO) Take 2 tablets by mouth at bedtime as needed and may repeat dose one time if needed.      . cholecalciferol (VITAMIN D) 1000 UNITS tablet Take 1,000 Units by mouth daily.      . dexamethasone (DECADRON) 4 MG tablet Take 20 mg by mouth once a week.      . DIOVAN 160 MG tablet Take 1 tablet by mouth 2 (two) times daily.      . diphenhydramine-acetaminophen (TYLENOL PM) 25-500 MG TABS Take 1 tablet by mouth at bedtime as needed.      . furosemide (LASIX) 20 MG tablet Take 20 mg by mouth as needed.      . nebivolol (BYSTOLIC) 5 MG tablet Take by mouth. Take 10 mg in the am and take  2.5 mg in the pm by mouth daily.      . nystatin cream (MYCOSTATIN) Apply topically 2 (two) times daily.  30 g  3  . omeprazole (PRILOSEC) 20 MG capsule Take 20 mg by mouth 2 (two) times daily.       . oxybutynin (DITROPAN) 5 MG tablet Take 1 tablet (5 mg total) by mouth daily.  30 tablet  3  . oxyCODONE-acetaminophen (PERCOCET) 5-325 MG per tablet Take 1 tablet by mouth every 6 (six) hours as needed. pain  45 tablet  0  . pomalidomide (POMALYST) 4 MG capsule Take 1 capsule (4 mg total) by mouth daily.  21 capsule  1  . potassium chloride SA (K-DUR,KLOR-CON) 20 MEQ tablet Take 20 mEq by mouth daily.       . rosuvastatin (CRESTOR) 20 MG tablet Take 20 mg by mouth at bedtime.        .              Allergies as of 12/15/2011 - Review Complete 12/15/2011  Allergen Reaction Noted  . Gabapentin Other (See Comments) 01/06/2011  . Simvastatin      Past Medical History  Diagnosis Date  . Essential hypertension, benign   . Coronary atherosclerosis of native coronary artery     Former patient of [facility] - PTCA apical LAD 2005  .   Type 2 diabetes mellitus     Borderline  . GERD (gastroesophageal reflux disease)   . Hypercholesteremia   . Female stress incontinence   . UTI (urinary tract infection)   . Vertigo   . Poor sleep pattern   . Colon cancer   . Multiple myeloma   . Osteoarthritis   . Arrhythmia   . Constipation   . Gastroparesis 2009    mild delay of emptying with 30% retained at 2 hours    Past Surgical History  Procedure Date  . Colon surgery 1995    Resection, [facility], colon cancer  . Abdominal hysterectomy 1970s    [facility]  . Knee arthroscopy 2003    LEFT, [facility]  . Heel spur surgery     KEELING, [facility]  . Portacath placement     [facility]   . Cervical spine surgery 2004    [facility]  . Port-a-cath removal 11/05/2010    Procedure: REMOVAL PORT-A-CATH;  Surgeon: Mark A Jenkins;  Location: [facility];  Service: General;  Laterality: Left;  started at 0846  . Portacath placement 11/05/2010     Procedure: INSERTION PORT-A-CATH;  Surgeon: Mark A Jenkins;  Location: [facility];  Service: General;  Laterality: Right;  Right subclavian, device left accessed   . Colonoscopy  07/02/2007    Normal rectum status post prior sigmoid resection with normal anastomosis identified at 35 cm.  Normal residual colon.. Next TCS 06/2012  . Esophagogastroduodenoscopy   08/22/2007    Small hiatal hernia/A circular, purplish, 1-cm, distal esophageal submucosal lesion of uncertain significance, otherwise normal esophagus. EUS done as below.  . Appendectomy 1953  . Eus 08/2007    esophageal bleb (aberrant blood vessel). retained gastric contents/food    Family History  Problem Relation Age of Onset  . Anesthesia problems Neg Hx   . Hypotension Neg Hx   . Malignant hyperthermia Neg Hx   . Pseudochol deficiency Neg Hx   . Lung disease Mother   . Heart disease Father   . Heart disease Brother   . Hypertension Brother   . Arthritis Daughter   . Seizures Daughter   . Multiple sclerosis Son   . Colon cancer Brother 66    History   Social History  . Marital Status: Divorced    Spouse Name: N/A    Number of Children: N/A  . Years of Education: N/A   Occupational History  . Not on file.   Social History Main Topics  . Smoking status: Former Smoker -- 0.2 packs/day    Types: Cigarettes    Quit date: 11/03/1990  . Smokeless tobacco: Never Used  . Alcohol Use: No  . Drug Use: No  . Sexually Active: Not on file   Other Topics Concern  . Not on file   Social History Narrative  . No narrative on file      ROS:  General: Negative for anorexia, weight loss, fever, chills, fatigue, weakness. Eyes: Negative for vision changes.  ENT: Negative for hoarseness, difficulty swallowing , nasal congestion. CV: Negative for chest pain, angina, palpitations, dyspnea on exertion, peripheral edema.  Respiratory: Negative for dyspnea at rest, dyspnea on exertion, cough, sputum, wheezing.  GI: See history  of present illness. GU:  Negative for dysuria, hematuria, urinary incontinence, urinary frequency, nocturnal urination.  MS: chronic intermittent joint pain, low back pain.  Derm: Negative for rash or itching.  Neuro: Negative for weakness, abnormal sensation, seizure, frequent headaches, memory loss, confusion.  Psych: Negative for anxiety, depression,   suicidal ideation, hallucinations.  Endo: Negative for unusual weight change.  Heme: Negative for bruising or bleeding. Allergy: Negative for rash or hives.    Physical Examination:  BP 156/82  Pulse 68  Temp 98 F (36.7 C) (Temporal)  Ht 5' 5" (1.651 m)  Wt 214 lb 6.4 oz (97.251 kg)  BMI 35.68 kg/m2   General: Well-nourished, well-developed in no acute distress.  Accompanied by daughter.  Head: Normocephalic, atraumatic.   Eyes: Conjunctiva pink, no icterus. Mouth: Oropharyngeal mucosa moist and pink , no lesions erythema or exudate. Neck: Supple without thyromegaly, masses, or lymphadenopathy.  Lungs: Clear to auscultation bilaterally.  Heart: Regular rate and rhythm, no murmurs rubs or gallops.  Abdomen: Bowel sounds are normal, left-mid/luq/epig tenderness, mild, nondistended, no hepatosplenomegaly or masses, no abdominal bruits or    hernia , no rebound or guarding.   Rectal: defer Extremities: No lower extremity edema. No clubbing or deformities.  Neuro: Alert and oriented x 4 , grossly normal neurologically.  Skin: Warm and dry, no rash or jaundice.   Psych: Alert and cooperative, normal mood and affect.  Labs: Lab Results  Component Value Date   WBC 5.0 12/07/2011   HGB 9.5* 12/07/2011   HCT 29.8* 12/07/2011   MCV 91.1 12/07/2011   PLT 221 12/07/2011   Lab Results  Component Value Date   CREATININE 1.17* 11/23/2011   BUN 13 11/23/2011   NA 139 11/23/2011   K 3.8 11/23/2011   CL 107 11/23/2011   CO2 22 11/23/2011   Lab Results  Component Value Date   ALT 8 11/23/2011   AST 12 11/23/2011   ALKPHOS 44 11/23/2011    BILITOT 0.3 11/23/2011   Lab Results  Component Value Date   IRON 109 08/19/2011   TIBC 217* 08/19/2011   FERRITIN 5081* 08/19/2011   Imaging Studies:   Abd u/s on 06/09/11. Negative.    

## 2011-12-15 NOTE — Progress Notes (Signed)
Faxed to PCP

## 2011-12-15 NOTE — Patient Instructions (Addendum)
We have scheduled you for a colonoscopy and upper endoscopy with Dr. Rourk. Please see separate instructions. 

## 2011-12-15 NOTE — Telephone Encounter (Signed)
Pt has not received bystolic or potassium with her other meds. Labs due next week

## 2011-12-15 NOTE — Assessment & Plan Note (Signed)
Chronic constipation. H/O colon cancer. Last TCS 2009. Anemia in setting of treatment for Multiple Myeloma. On Aranesp intermittently. Offered colonoscopy at time of EGD.  I have discussed the risks, alternatives, benefits with regards to but not limited to the risk of reaction to medication, bleeding, infection, perforation and the patient is agreeable to proceed. Written consent to be obtained.

## 2011-12-15 NOTE — Assessment & Plan Note (Signed)
Ongoing chronic epigastric pain/left-sided abdominal pain unrelated to meals. Typical GERD controlled. H/O gastroparesis but without N/V. Patient on prn Aleve. Decadron more recently. Offered EGD for further evaluation.  I have discussed the risks, alternatives, benefits with regards to but not limited to the risk of reaction to medication, bleeding, infection, perforation and the patient is agreeable to proceed. Written consent to be obtained.

## 2011-12-21 ENCOUNTER — Encounter (HOSPITAL_BASED_OUTPATIENT_CLINIC_OR_DEPARTMENT_OTHER): Payer: Medicare HMO

## 2011-12-21 DIAGNOSIS — C9000 Multiple myeloma not having achieved remission: Secondary | ICD-10-CM

## 2011-12-21 LAB — CBC
HCT: 33.1 % — ABNORMAL LOW (ref 36.0–46.0)
Hemoglobin: 10.5 g/dL — ABNORMAL LOW (ref 12.0–15.0)
MCH: 29.9 pg (ref 26.0–34.0)
MCHC: 31.7 g/dL (ref 30.0–36.0)
MCV: 94.3 fL (ref 78.0–100.0)

## 2011-12-21 LAB — DIFFERENTIAL
Basophils Relative: 0 % (ref 0–1)
Eosinophils Absolute: 0.4 10*3/uL (ref 0.0–0.7)
Eosinophils Relative: 6 % — ABNORMAL HIGH (ref 0–5)
Monocytes Absolute: 0.3 10*3/uL (ref 0.1–1.0)
Monocytes Relative: 4 % (ref 3–12)
Neutrophils Relative %: 75 % (ref 43–77)

## 2011-12-21 LAB — COMPREHENSIVE METABOLIC PANEL
Albumin: 3.4 g/dL — ABNORMAL LOW (ref 3.5–5.2)
BUN: 11 mg/dL (ref 6–23)
Creatinine, Ser: 1.22 mg/dL — ABNORMAL HIGH (ref 0.50–1.10)
Total Bilirubin: 0.4 mg/dL (ref 0.3–1.2)
Total Protein: 6.7 g/dL (ref 6.0–8.3)

## 2011-12-21 NOTE — Progress Notes (Signed)
Labs drawn cbc/diff,cmp,kllc,mm panel

## 2011-12-22 LAB — KAPPA/LAMBDA LIGHT CHAINS: Lambda free light chains: 3.4 mg/dL — ABNORMAL HIGH (ref 0.57–2.63)

## 2011-12-23 ENCOUNTER — Encounter (HOSPITAL_COMMUNITY): Payer: Self-pay | Admitting: Pharmacy Technician

## 2011-12-23 LAB — MULTIPLE MYELOMA PANEL, SERUM
Albumin ELP: 58.1 % (ref 55.8–66.1)
Alpha-2-Globulin: 12.1 % — ABNORMAL HIGH (ref 7.1–11.8)
Beta Globulin: 5.4 % (ref 4.7–7.2)
IgA: 108 mg/dL (ref 69–380)
IgM, Serum: 56 mg/dL — ABNORMAL LOW (ref 52–322)
Total Protein: 6.6 g/dL (ref 6.0–8.3)

## 2011-12-30 ENCOUNTER — Ambulatory Visit (HOSPITAL_COMMUNITY): Payer: Medicare HMO

## 2011-12-30 ENCOUNTER — Encounter (HOSPITAL_BASED_OUTPATIENT_CLINIC_OR_DEPARTMENT_OTHER): Payer: PRIVATE HEALTH INSURANCE

## 2011-12-30 ENCOUNTER — Encounter (HOSPITAL_COMMUNITY): Payer: PRIVATE HEALTH INSURANCE | Attending: Oncology

## 2011-12-30 ENCOUNTER — Telehealth (HOSPITAL_COMMUNITY): Payer: Self-pay | Admitting: Oncology

## 2011-12-30 VITALS — BP 125/72

## 2011-12-30 DIAGNOSIS — C9000 Multiple myeloma not having achieved remission: Secondary | ICD-10-CM

## 2011-12-30 DIAGNOSIS — I10 Essential (primary) hypertension: Secondary | ICD-10-CM

## 2011-12-30 DIAGNOSIS — T451X5A Adverse effect of antineoplastic and immunosuppressive drugs, initial encounter: Secondary | ICD-10-CM

## 2011-12-30 DIAGNOSIS — D630 Anemia in neoplastic disease: Secondary | ICD-10-CM

## 2011-12-30 DIAGNOSIS — D638 Anemia in other chronic diseases classified elsewhere: Secondary | ICD-10-CM

## 2011-12-30 LAB — CBC
MCH: 30.2 pg (ref 26.0–34.0)
MCHC: 31.8 g/dL (ref 30.0–36.0)
Platelets: 251 10*3/uL (ref 150–400)
RDW: 20.2 % — ABNORMAL HIGH (ref 11.5–15.5)

## 2011-12-30 MED ORDER — DARBEPOETIN ALFA-POLYSORBATE 500 MCG/ML IJ SOLN
500.0000 ug | Freq: Once | INTRAMUSCULAR | Status: AC
  Administered 2011-12-30: 500 ug via SUBCUTANEOUS

## 2011-12-30 MED ORDER — DARBEPOETIN ALFA-POLYSORBATE 500 MCG/ML IJ SOLN
INTRAMUSCULAR | Status: AC
  Filled 2011-12-30: qty 1

## 2011-12-30 NOTE — Progress Notes (Signed)
[name] presents today for injection per MD orders. Aranesp administered SQ in left Abdomen. Administration without incident. Patient tolerated well.

## 2011-12-30 NOTE — Telephone Encounter (Signed)
PT HAS SWITCHED INS FROM [employer] TO [employer]. EFFT 12/25/11 PER [employer] PT'S POMALYST REQUIRED AUTH. AUTH# [id number]  12/30/11-08/28/12. CALLED [employer] TO GIVE THEM PT'S NEW INS AND AUTH# SPK TO [employer]. PER [employer] PT IS ALLOWED TO [place] THE SPEC RX OF THEIR CHOICE. BUT RX AUTH MUST GO THRU THEM.  [phone]) [phone] (F)  April Manson Database administrator Medical Oncology [phone]

## 2011-12-30 NOTE — Progress Notes (Signed)
Labs drawn today for cbc 

## 2012-01-03 ENCOUNTER — Other Ambulatory Visit (HOSPITAL_COMMUNITY): Payer: Self-pay | Admitting: Oncology

## 2012-01-04 ENCOUNTER — Encounter (HOSPITAL_BASED_OUTPATIENT_CLINIC_OR_DEPARTMENT_OTHER): Payer: PRIVATE HEALTH INSURANCE

## 2012-01-04 ENCOUNTER — Other Ambulatory Visit (HOSPITAL_COMMUNITY): Payer: Self-pay | Admitting: Oncology

## 2012-01-04 DIAGNOSIS — N3281 Overactive bladder: Secondary | ICD-10-CM

## 2012-01-04 DIAGNOSIS — C9000 Multiple myeloma not having achieved remission: Secondary | ICD-10-CM

## 2012-01-04 LAB — CBC
HCT: 33.5 % — ABNORMAL LOW (ref 36.0–46.0)
Hemoglobin: 10.4 g/dL — ABNORMAL LOW (ref 12.0–15.0)
MCH: 30 pg (ref 26.0–34.0)
MCV: 96.5 fL (ref 78.0–100.0)
RBC: 3.47 MIL/uL — ABNORMAL LOW (ref 3.87–5.11)

## 2012-01-04 LAB — DIFFERENTIAL
Eosinophils Absolute: 1 10*3/uL — ABNORMAL HIGH (ref 0.0–0.7)
Eosinophils Relative: 27 % — ABNORMAL HIGH (ref 0–5)
Lymphs Abs: 0.9 10*3/uL (ref 0.7–4.0)
Monocytes Absolute: 0.3 10*3/uL (ref 0.1–1.0)
Monocytes Relative: 8 % (ref 3–12)

## 2012-01-04 MED ORDER — OXYBUTYNIN CHLORIDE 5 MG PO TABS: 5.0000 mg | ORAL_TABLET | Freq: Every day | ORAL | Status: DC

## 2012-01-04 MED ORDER — POMALIDOMIDE 4 MG PO CAPS: ORAL_CAPSULE | ORAL | Status: DC

## 2012-01-04 NOTE — Progress Notes (Signed)
Labs drawn today for cbc/diff 

## 2012-01-05 ENCOUNTER — Encounter: Payer: Self-pay | Admitting: Oncology

## 2012-01-09 ENCOUNTER — Ambulatory Visit (HOSPITAL_COMMUNITY)
Admission: RE | Admit: 2012-01-09 | Discharge: 2012-01-09 | Disposition: A | Discharge status: Home / Self Care | Payer: PRIVATE HEALTH INSURANCE | Source: Ambulatory Visit | Attending: Internal Medicine | Admitting: Internal Medicine

## 2012-01-09 ENCOUNTER — Encounter (HOSPITAL_COMMUNITY)
Admission: RE | Disposition: A | Discharge status: Home / Self Care | Payer: Self-pay | Source: Ambulatory Visit | Attending: Internal Medicine

## 2012-01-09 ENCOUNTER — Encounter (HOSPITAL_COMMUNITY): Payer: Self-pay | Admitting: *Deleted

## 2012-01-09 DIAGNOSIS — D126 Benign neoplasm of colon, unspecified: Secondary | ICD-10-CM

## 2012-01-09 DIAGNOSIS — E119 Type 2 diabetes mellitus without complications: Secondary | ICD-10-CM | POA: Insufficient documentation

## 2012-01-09 DIAGNOSIS — K449 Diaphragmatic hernia without obstruction or gangrene: Secondary | ICD-10-CM | POA: Insufficient documentation

## 2012-01-09 DIAGNOSIS — R1013 Epigastric pain: Secondary | ICD-10-CM

## 2012-01-09 DIAGNOSIS — Z85038 Personal history of other malignant neoplasm of large intestine: Secondary | ICD-10-CM | POA: Insufficient documentation

## 2012-01-09 DIAGNOSIS — R109 Unspecified abdominal pain: Secondary | ICD-10-CM

## 2012-01-09 DIAGNOSIS — E7800 Pure hypercholesterolemia, unspecified: Secondary | ICD-10-CM | POA: Insufficient documentation

## 2012-01-09 DIAGNOSIS — Z1211 Encounter for screening for malignant neoplasm of colon: Secondary | ICD-10-CM

## 2012-01-09 DIAGNOSIS — I10 Essential (primary) hypertension: Secondary | ICD-10-CM | POA: Insufficient documentation

## 2012-01-09 DIAGNOSIS — K219 Gastro-esophageal reflux disease without esophagitis: Secondary | ICD-10-CM

## 2012-01-09 HISTORY — PX: COLONOSCOPY: SHX174

## 2012-01-09 HISTORY — DX: Benign neoplasm of colon, unspecified: D12.6

## 2012-01-09 HISTORY — PX: ESOPHAGOGASTRODUODENOSCOPY: SHX1529

## 2012-01-09 SURGERY — COLONOSCOPY WITH ESOPHAGOGASTRODUODENOSCOPY (EGD)
Anesthesia: Moderate Sedation

## 2012-01-09 MED ORDER — SODIUM CHLORIDE 0.45 % IV SOLN
INTRAVENOUS | Status: DC
  Administered 2012-01-09: 13:00:00 via INTRAVENOUS

## 2012-01-09 MED ORDER — BUTAMBEN-TETRACAINE-BENZOCAINE 2-2-14 % EX AERO
INHALATION_SPRAY | CUTANEOUS | Status: DC | PRN
  Administered 2012-01-09: 2 via TOPICAL

## 2012-01-09 MED ORDER — STERILE WATER FOR IRRIGATION IR SOLN
Status: DC | PRN
  Administered 2012-01-09: 13:00:00

## 2012-01-09 MED ORDER — MIDAZOLAM HCL 5 MG/5ML IJ SOLN
INTRAMUSCULAR | Status: DC | PRN
  Administered 2012-01-09 (×2): 1 mg via INTRAVENOUS
  Administered 2012-01-09: 2 mg via INTRAVENOUS

## 2012-01-09 MED ORDER — MIDAZOLAM HCL 5 MG/5ML IJ SOLN
INTRAMUSCULAR | Status: AC
  Filled 2012-01-09: qty 10

## 2012-01-09 MED ORDER — MEPERIDINE HCL 100 MG/ML IJ SOLN
INTRAMUSCULAR | Status: DC | PRN
  Administered 2012-01-09 (×2): 25 mg via INTRAVENOUS
  Administered 2012-01-09: 50 mg via INTRAVENOUS

## 2012-01-09 MED ORDER — MEPERIDINE HCL 100 MG/ML IJ SOLN
INTRAMUSCULAR | Status: AC
  Filled 2012-01-09: qty 2

## 2012-01-09 NOTE — Op Note (Signed)
[facility] [address] [place], [address]   ENDOSCOPY PROCEDURE REPORT  PATIENT: [name], [name].  MR#: [id number] BIRTHDATE: 1940-12-11 , 70  yrs. old GENDER: Female ENDOSCOPIST: R.  Roetta Sessions, MD FACP [facility] REFERRED BY:  Oval Linsey, M.D. PROCEDURE DATE:  01/09/2012 PROCEDURE:     diagnostic EGD  INDICATIONS:     abdominal pain  INFORMED CONSENT:   The risks, benefits, limitations, alternatives and imponderables have been discussed.  The potential for biopsy, esophogeal dilation, etc. have also been reviewed.  Questions have been answered.  All parties agreeable.  Please see the history and physical in the medical record for more information.  MEDICATIONS:  Versed 3 mg IV and Demerol 75 mg IV in divided doses.  DESCRIPTION OF PROCEDURE:   The EG-[id number] ([id number]) and EC-[id number] ([id number])  endoscope was introduced through the mouth and advanced to the second portion of the duodenum without difficulty or limitations.  The mucosal surfaces were surveyed very carefully during advancement of the scope and upon withdrawal.  Retroflexion view of the proximal stomach and esophagogastric junction was performed.      FINDINGS: Normal esophagus.stomach empty. Small hiatal hernia; otherwise, normal-appearing gastric mucosa. Patent pylorus. Normal first and second portion of the duodenum.  THERAPEUTIC / DIAGNOSTIC MANEUVERS PERFORMED:  none   COMPLICATIONS:  None  IMPRESSION:  Small hiatal hernia-otherwise, normal exam  RECOMMENDATIONS:  See colonoscopy report    _______________________________ R. Roetta Sessions, MD FACP [facility] eSigned:  R. Roetta Sessions, MD FACP [facility] 01/09/2012 2:33 PM     CC:  PATIENT NAME:  [name], [name]. MR#: [id number]

## 2012-01-09 NOTE — Op Note (Signed)
[facility] [address] [place], [address]   COLONOSCOPY PROCEDURE REPORT  PATIENT: [name], [name].  MR#:         [id number] BIRTHDATE: [birth year] , 70  yrs. old GENDER: Female ENDOSCOPIST: R.  Roetta Sessions, MD FACP [facility] REFERRED BY:  Oval Linsey, M.D. PROCEDURE DATE:  01/09/2012 PROCEDURE:     Colonoscopy with biopsy  INDICATIONS: history of colon cancer; surveillance examination  INFORMED CONSENT:  The risks, benefits, alternatives and imponderables including but not limited to bleeding, perforation as well as the possibility of a missed lesion have been reviewed.  The potential for biopsy, lesion removal, etc. have also been discussed.  Questions have been answered.  All parties agreeable. Please see the history and physical in the medical record for more information.  MEDICATIONS: Versed 4 mg IV and Demerol 100 mg IV in divided doses  DESCRIPTION OF PROCEDURE:  After a digital rectal exam was performed, the Pentax Colonoscope [phone]  colonoscope was advanced from the anus through the rectum and colon to the area of the cecum, ileocecal valve and appendiceal orifice.  The cecum was deeply intubated.  These structures were well-seen and photographed for the record.  From the level of the cecum and ileocecal valve, the scope was slowly and cautiously withdrawn.  The mucosal surfaces were carefully surveyed utilizing scope tip deflection to facilitate fold flattening as needed.  The scope was pulled down into the rectum where a thorough examination including retroflexion was performed.    FINDINGS:  suboptimal preparation. Normal rectum. Surgical anastomosis at 25 cm. Single diminutive polyp just proximal to the surgical anastomosis. There was one other diminutive polyp in the base of the cecum. The remainder of the residual rectum and colon appeared normal.  THERAPEUTIC / DIAGNOSTIC MANEUVERS PERFORMED: above-mentioned polyps were cold  biopsied/removed  COMPLICATIONS: none  CECAL WITHDRAWAL TIME:  7 minutes  IMPRESSION:  surgical anastomosis at 25 cm. 2 diminutive polyps-removed as described above  RECOMMENDATIONS: followup on pathology.   _______________________________ eSigned:  R. Roetta Sessions, MD FACP [facility] 01/09/2012 2:39 PM   CC:

## 2012-01-09 NOTE — Progress Notes (Signed)
Called by endoscopy suite for pt to have assistance at home. Wanting aide on a daily basis. Pt has Medicaid. CM advised Evangeline Gula to tell them to go to Social Security to apply for the CAPS program.

## 2012-01-09 NOTE — Interval H&P Note (Signed)
History and Physical Interval Note:  01/09/2012 1:24 PM  [name]  has presented today for surgery, with the diagnosis of history of colon rectal cancer, epigastric pain,  gerd  The various methods of treatment have been discussed with the patient and family. After consideration of risks, benefits and other options for treatment, the patient has consented to  Procedure(s) (LRB) with comments: COLONOSCOPY WITH ESOPHAGOGASTRODUODENOSCOPY (EGD) (N/A) - 1:20 as a surgical intervention .  The patient's history has been reviewed, patient examined, no change in status, stable for surgery.  I have reviewed the patient's chart and labs.  Questions were answered to the patient's satisfaction.     Eula Listen

## 2012-01-09 NOTE — H&P (View-Only) (Signed)
Primary Care Physician:  [name], MD  Primary Gastroenterologist:  Roetta Sessions, MD   Chief Complaint  Patient presents with  . Abdominal Pain  . Gastrophageal Reflux    HPI:  [name] is a 71 y.o. female here for further evaluation of ongoing abdominal pain. She states she has had pain for years but worse the last one year. We have followed her previously for GERD, gastroparesis, colon cancer. She was last seen in 07/2008. She also gives remote h/o PUD.  She describes left-sided abdominal pain/luq/epigastric pain which occurs unrelated to meals. No nocturnal symptoms. May last for several hours at a time. May go weeks without pain. Drinking large "gulp" of water helps temporarily. Per Jenita Seashore, PA-C with Dr. Mariel Sleet, she has been on Dexilant, Nexium, Carafate, GI cocktail with little relief. Currently taking omeprazole 20mg  BID. Patient reports being on omeprazole for long time. She takes aleve prn arthritis. Not daily. If Aleve doesn't help, then she takes oxycodone. She has frequent constipation. Takes dulcolax if not BM in 3 days. No melena, brbpr. Heartburn controlled. No n/v. Appetite good. No significant weight loss per patient.    Current Outpatient Prescriptions  Medication Sig Dispense Refill  . aspirin 81 MG EC tablet Take 81 mg by mouth every morning.        . Bisacodyl (DULCOLAX PO) Take 2 tablets by mouth at bedtime as needed and may repeat dose one time if needed.      . cholecalciferol (VITAMIN D) 1000 UNITS tablet Take 1,000 Units by mouth daily.      Marland Kitchen dexamethasone (DECADRON) 4 MG tablet Take 20 mg by mouth once a week.      Marland Kitchen DIOVAN 160 MG tablet Take 1 tablet by mouth 2 (two) times daily.      . diphenhydramine-acetaminophen (TYLENOL PM) 25-500 MG TABS Take 1 tablet by mouth at bedtime as needed.      . furosemide (LASIX) 20 MG tablet Take 20 mg by mouth as needed.      . nebivolol (BYSTOLIC) 5 MG tablet Take by mouth. Take 10 mg in the am and take  2.5 mg in the pm by mouth daily.      Marland Kitchen nystatin cream (MYCOSTATIN) Apply topically 2 (two) times daily.  30 g  3  . omeprazole (PRILOSEC) 20 MG capsule Take 20 mg by mouth 2 (two) times daily.       Marland Kitchen oxybutynin (DITROPAN) 5 MG tablet Take 1 tablet (5 mg total) by mouth daily.  30 tablet  3  . oxyCODONE-acetaminophen (PERCOCET) 5-325 MG per tablet Take 1 tablet by mouth every 6 (six) hours as needed. pain  45 tablet  0  . pomalidomide (POMALYST) 4 MG capsule Take 1 capsule (4 mg total) by mouth daily.  21 capsule  1  . potassium chloride SA (K-DUR,KLOR-CON) 20 MEQ tablet Take 20 mEq by mouth daily.       . rosuvastatin (CRESTOR) 20 MG tablet Take 20 mg by mouth at bedtime.        .              Allergies as of 12/15/2011 - Review Complete 12/15/2011  Allergen Reaction Noted  . Gabapentin Other (See Comments) 01/06/2011  . Simvastatin      Past Medical History  Diagnosis Date  . Essential hypertension, benign   . Coronary atherosclerosis of native coronary artery     Former patient of [facility] - [facility] apical LAD 2005  .  Type 2 diabetes mellitus     Borderline  . GERD (gastroesophageal reflux disease)   . Hypercholesteremia   . Female stress incontinence   . UTI (urinary tract infection)   . Vertigo   . Poor sleep pattern   . Colon cancer   . Multiple myeloma   . Osteoarthritis   . Arrhythmia   . Constipation   . Gastroparesis 2009    mild delay of emptying with 30% retained at 2 hours    Past Surgical History  Procedure Date  . Colon surgery 1995    Resection, [facility], colon cancer  . Abdominal hysterectomy 1970s    [facility]  . Knee arthroscopy 2003    LEFT, [facility]  . Heel spur surgery     KEELING, [facility]  . Portacath placement     [facility]   . Cervical spine surgery 2004    [facility]  . Port-a-cath removal 11/05/2010    Procedure: REMOVAL PORT-A-CATH;  Surgeon: Dalia Heading;  Location: [facility];  Service: General;  Laterality: Left;  started at 0846  . Portacath placement 11/05/2010     Procedure: INSERTION PORT-A-CATH;  Surgeon: Dalia Heading;  Location: [facility];  Service: General;  Laterality: Right;  Right subclavian, device left accessed   . Colonoscopy  07/02/2007    Normal rectum status post prior sigmoid resection with normal anastomosis identified at 35 cm.  Normal residual colon.Marland Kitchen Next TCS 06/2012  . Esophagogastroduodenoscopy   08/22/2007    Small hiatal hernia/A circular, purplish, 1-cm, distal esophageal submucosal lesion of uncertain significance, otherwise normal esophagus. EUS done as below.  Marland Kitchen Appendectomy 1953  . Eus 08/2007    esophageal bleb (aberrant blood vessel). retained gastric contents/food    Family History  Problem Relation Age of Onset  . Anesthesia problems Neg Hx   . Hypotension Neg Hx   . Malignant hyperthermia Neg Hx   . Pseudochol deficiency Neg Hx   . Lung disease Mother   . Heart disease Father   . Heart disease Brother   . Hypertension Brother   . Arthritis Daughter   . Seizures Daughter   . Multiple sclerosis Son   . Colon cancer Brother 32    History   Social History  . Marital Status: Divorced    Spouse Name: N/A    Number of Children: N/A  . Years of Education: N/A   Occupational History  . Not on file.   Social History Main Topics  . Smoking status: Former Smoker -- 0.2 packs/day    Types: Cigarettes    Quit date: 11/03/1990  . Smokeless tobacco: Never Used  . Alcohol Use: No  . Drug Use: No  . Sexually Active: Not on file   Other Topics Concern  . Not on file   Social History Narrative  . No narrative on file      ROS:  General: Negative for anorexia, weight loss, fever, chills, fatigue, weakness. Eyes: Negative for vision changes.  ENT: Negative for hoarseness, difficulty swallowing , nasal congestion. CV: Negative for chest pain, angina, palpitations, dyspnea on exertion, peripheral edema.  Respiratory: Negative for dyspnea at rest, dyspnea on exertion, cough, sputum, wheezing.  GI: See history  of present illness. GU:  Negative for dysuria, hematuria, urinary incontinence, urinary frequency, nocturnal urination.  MS: chronic intermittent joint pain, low back pain.  Derm: Negative for rash or itching.  Neuro: Negative for weakness, abnormal sensation, seizure, frequent headaches, memory loss, confusion.  Psych: Negative for anxiety, depression,  suicidal ideation, hallucinations.  Endo: Negative for unusual weight change.  Heme: Negative for bruising or bleeding. Allergy: Negative for rash or hives.    Physical Examination:  BP 156/82  Pulse 68  Temp 98 F (36.7 C) (Temporal)  Ht 5\' 5"  (1.651 m)  Wt 214 lb 6.4 oz (97.251 kg)  BMI 35.68 kg/m2   General: Well-nourished, well-developed in no acute distress.  Accompanied by daughter.  Head: Normocephalic, atraumatic.   Eyes: Conjunctiva pink, no icterus. Mouth: Oropharyngeal mucosa moist and pink , no lesions erythema or exudate. Neck: Supple without thyromegaly, masses, or lymphadenopathy.  Lungs: Clear to auscultation bilaterally.  Heart: Regular rate and rhythm, no murmurs rubs or gallops.  Abdomen: Bowel sounds are normal, left-mid/luq/epig tenderness, mild, nondistended, no hepatosplenomegaly or masses, no abdominal bruits or    hernia , no rebound or guarding.   Rectal: defer Extremities: No lower extremity edema. No clubbing or deformities.  Neuro: Alert and oriented x 4 , grossly normal neurologically.  Skin: Warm and dry, no rash or jaundice.   Psych: Alert and cooperative, normal mood and affect.  Labs: Lab Results  Component Value Date   WBC 5.0 12/07/2011   HGB 9.5* 12/07/2011   HCT 29.8* 12/07/2011   MCV 91.1 12/07/2011   PLT 221 12/07/2011   Lab Results  Component Value Date   CREATININE 1.17* 11/23/2011   BUN 13 11/23/2011   NA 139 11/23/2011   K 3.8 11/23/2011   CL 107 11/23/2011   CO2 22 11/23/2011   Lab Results  Component Value Date   ALT 8 11/23/2011   AST 12 11/23/2011   ALKPHOS 44 11/23/2011    BILITOT 0.3 11/23/2011   Lab Results  Component Value Date   IRON 109 08/19/2011   TIBC 217* 08/19/2011   FERRITIN 5081* 08/19/2011   Imaging Studies:   Abd u/s on 06/09/11. Negative.

## 2012-01-10 ENCOUNTER — Other Ambulatory Visit (HOSPITAL_COMMUNITY): Payer: Self-pay | Admitting: Oncology

## 2012-01-10 ENCOUNTER — Encounter (HOSPITAL_COMMUNITY): Payer: Self-pay | Admitting: Oncology

## 2012-01-10 ENCOUNTER — Encounter (HOSPITAL_BASED_OUTPATIENT_CLINIC_OR_DEPARTMENT_OTHER): Payer: PRIVATE HEALTH INSURANCE | Admitting: Oncology

## 2012-01-10 DIAGNOSIS — G620 Drug-induced polyneuropathy: Secondary | ICD-10-CM

## 2012-01-10 DIAGNOSIS — C9000 Multiple myeloma not having achieved remission: Secondary | ICD-10-CM

## 2012-01-10 DIAGNOSIS — G8929 Other chronic pain: Secondary | ICD-10-CM

## 2012-01-10 DIAGNOSIS — L270 Generalized skin eruption due to drugs and medicaments taken internally: Secondary | ICD-10-CM

## 2012-01-10 LAB — COMPREHENSIVE METABOLIC PANEL
ALT: 12 U/L (ref 0–35)
AST: 14 U/L (ref 0–37)
CO2: 25 mEq/L (ref 19–32)
Calcium: 9.7 mg/dL (ref 8.4–10.5)
Sodium: 135 mEq/L (ref 135–145)
Total Protein: 6.8 g/dL (ref 6.0–8.3)

## 2012-01-10 LAB — CBC
MCV: 96.5 fL (ref 78.0–100.0)
Platelets: 243 10*3/uL (ref 150–400)
RDW: 20.3 % — ABNORMAL HIGH (ref 11.5–15.5)
WBC: 4.8 10*3/uL (ref 4.0–10.5)

## 2012-01-10 LAB — DIFFERENTIAL
Basophils Absolute: 0 10*3/uL (ref 0.0–0.1)
Eosinophils Absolute: 0.5 10*3/uL (ref 0.0–0.7)
Eosinophils Relative: 10 % — ABNORMAL HIGH (ref 0–5)
Lymphocytes Relative: 39 % (ref 12–46)

## 2012-01-10 MED ORDER — LORAZEPAM 0.5 MG PO TABS: 0.5000 mg | ORAL_TABLET | Freq: Four times a day (QID) | ORAL | Status: DC | PRN

## 2012-01-10 NOTE — Patient Instructions (Addendum)
[facility]  Discharge Instructions [name]  DOB Jul 17, 1940 CSN [id number]  MRN [id number] Dr. Glenford Ray  RECOMMENDATIONS MADE BY THE CONSULTANT AND ANY TEST RESULTS WILL BE SENT TO YOUR REFERRING DOCTOR.   EXAM FINDINGS BY MD TODAY AND SIGNS AND SYMPTOMS TO REPORT TO CLINIC OR PRIMARY MD: Dr. Mariel Ray says you are very stable right now.  MEDICATIONS PRESCRIBED: continue decadron on wednesdays and the Multiple myeloma drug pomalidomide 21 days on and 7 days off. Due to start on 01/14/2012    INSTRUCTIONS GIVEN AND DISCUSSED: We will do your big labs every 28 days but we will also need to check a blood count every time before your aranesp shot, which is every 3 weeks.  SPECIAL INSTRUCTIONS/FOLLOW-UP: See [name] in 2 months   I acknowledge that I have been informed and understand all the instructions given to me and received a copy. I do not have any more questions at this time, but understand that I may call the Specialty Clinic at [facility] at [phone] during business hours should I have any further questions or need assistance in obtaining follow-up care.    __________________________________________  _____________  __________ Signature of Patient or Authorized Representative            Date                   Time    __________________________________________ Nurse's Signature

## 2012-01-10 NOTE — Progress Notes (Signed)
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 She is presently status post 4 cycles of pomalyst 21 days on 7 days off. She is in her 7 day brea, presently. She is due to start cycle 5 on 01/14/2012. Her monoclonal spike is in the 300-400 mg/dL range which is essentially stable for her.  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  She is having trouble walking because of the pain in her lower legs and her feet. She is not very active. She remains overweight for her height but about the same as she was when I first started seeing her. She originally weighed 244 pounds in November 2009. Her appetite is still good. She is not having fevers or infections. Bowel function is good she recently had EGD and colonoscopy yesterday by Dr. Karilyn Cota which was very unremarkable.  She has a lot of questions which I hope answered to her satisfaction which was revolving around how she was doing. I think she stable from the myeloma standpoint we need to continue this therapy. We will continue take care of her bones. We need to check her blood work monthly. We will see her in 8 weeks. Her daughter was with her today and her name is Bronson Ing

## 2012-01-11 LAB — KAPPA/LAMBDA LIGHT CHAINS
Kappa free light chain: 1.96 mg/dL — ABNORMAL HIGH (ref 0.33–1.94)
Kappa, lambda light chain ratio: 0.41 (ref 0.26–1.65)

## 2012-01-12 ENCOUNTER — Encounter: Payer: Self-pay | Admitting: *Deleted

## 2012-01-12 ENCOUNTER — Encounter: Payer: Self-pay | Admitting: Internal Medicine

## 2012-01-12 LAB — MULTIPLE MYELOMA PANEL, SERUM
Alpha-1-Globulin: 5.3 % — ABNORMAL HIGH (ref 2.9–4.9)
Alpha-2-Globulin: 11.7 % (ref 7.1–11.8)
Beta 2: 5.2 % (ref 3.2–6.5)
Gamma Globulin: 16.1 % (ref 11.1–18.8)

## 2012-01-18 ENCOUNTER — Encounter (HOSPITAL_COMMUNITY): Payer: PRIVATE HEALTH INSURANCE

## 2012-01-20 ENCOUNTER — Encounter (HOSPITAL_BASED_OUTPATIENT_CLINIC_OR_DEPARTMENT_OTHER): Payer: PRIVATE HEALTH INSURANCE

## 2012-01-20 ENCOUNTER — Other Ambulatory Visit (HOSPITAL_COMMUNITY): Payer: Medicare Other

## 2012-01-20 VITALS — BP 164/81 | HR 43

## 2012-01-20 DIAGNOSIS — D649 Anemia, unspecified: Secondary | ICD-10-CM

## 2012-01-20 DIAGNOSIS — I10 Essential (primary) hypertension: Secondary | ICD-10-CM

## 2012-01-20 DIAGNOSIS — D630 Anemia in neoplastic disease: Secondary | ICD-10-CM

## 2012-01-20 DIAGNOSIS — D6481 Anemia due to antineoplastic chemotherapy: Secondary | ICD-10-CM

## 2012-01-20 DIAGNOSIS — C9000 Multiple myeloma not having achieved remission: Secondary | ICD-10-CM

## 2012-01-20 DIAGNOSIS — D638 Anemia in other chronic diseases classified elsewhere: Secondary | ICD-10-CM

## 2012-01-20 MED ORDER — HEPARIN SOD (PORK) LOCK FLUSH 100 UNIT/ML IV SOLN
500.0000 [IU] | Freq: Once | INTRAVENOUS | Status: AC
  Administered 2012-01-20: 500 [IU] via INTRAVENOUS
  Filled 2012-01-20: qty 5

## 2012-01-20 MED ORDER — SODIUM CHLORIDE 0.9 % IJ SOLN
INTRAMUSCULAR | Status: AC
  Filled 2012-01-20: qty 10

## 2012-01-20 MED ORDER — SODIUM CHLORIDE 0.9 % IJ SOLN
10.0000 mL | INTRAMUSCULAR | Status: DC | PRN
  Administered 2012-01-20: 10 mL via INTRAVENOUS
  Filled 2012-01-20: qty 10

## 2012-01-20 MED ORDER — DARBEPOETIN ALFA-POLYSORBATE 500 MCG/ML IJ SOLN
INTRAMUSCULAR | Status: AC
  Filled 2012-01-20: qty 1

## 2012-01-20 MED ORDER — SODIUM CHLORIDE 0.9 % IV SOLN: INTRAVENOUS | Status: DC

## 2012-01-20 MED ORDER — DARBEPOETIN ALFA-POLYSORBATE 500 MCG/ML IJ SOLN
500.0000 ug | Freq: Once | INTRAMUSCULAR | Status: AC
  Administered 2012-01-20: 500 ug via SUBCUTANEOUS

## 2012-01-20 MED ORDER — HEPARIN SOD (PORK) LOCK FLUSH 100 UNIT/ML IV SOLN
INTRAVENOUS | Status: AC
  Filled 2012-01-20: qty 5

## 2012-01-20 NOTE — Progress Notes (Signed)
Tolerated injection well. Port flushed without difficulty.  Good blood return.

## 2012-01-24 DIAGNOSIS — I639 Cerebral infarction, unspecified: Secondary | ICD-10-CM

## 2012-01-24 HISTORY — DX: Cerebral infarction, unspecified: I63.9

## 2012-01-27 ENCOUNTER — Encounter: Payer: Self-pay | Admitting: Oncology

## 2012-02-01 ENCOUNTER — Encounter (HOSPITAL_COMMUNITY): Payer: PRIVATE HEALTH INSURANCE

## 2012-02-03 ENCOUNTER — Other Ambulatory Visit (HOSPITAL_COMMUNITY): Payer: Self-pay | Admitting: Oncology

## 2012-02-03 DIAGNOSIS — C9000 Multiple myeloma not having achieved remission: Secondary | ICD-10-CM

## 2012-02-03 MED ORDER — POMALIDOMIDE 4 MG PO CAPS: ORAL_CAPSULE | ORAL | Status: DC

## 2012-02-07 ENCOUNTER — Ambulatory Visit (HOSPITAL_COMMUNITY)
Admission: RE | Admit: 2012-02-07 | Discharge: 2012-02-07 | Disposition: A | Discharge status: Home / Self Care | Payer: PRIVATE HEALTH INSURANCE | Source: Ambulatory Visit | Attending: Oncology | Admitting: Oncology

## 2012-02-07 ENCOUNTER — Inpatient Hospital Stay (HOSPITAL_COMMUNITY)
Admission: EM | Admit: 2012-02-07 | Discharge: 2012-02-10 | DRG: 064 | Disposition: A | Discharge status: Home Health | Payer: PRIVATE HEALTH INSURANCE | Attending: Neurology | Admitting: Neurology

## 2012-02-07 ENCOUNTER — Ambulatory Visit (HOSPITAL_COMMUNITY): Payer: Medicare Other

## 2012-02-07 ENCOUNTER — Encounter (HOSPITAL_COMMUNITY): Payer: PRIVATE HEALTH INSURANCE | Attending: Oncology

## 2012-02-07 ENCOUNTER — Inpatient Hospital Stay (HOSPITAL_COMMUNITY): Payer: PRIVATE HEALTH INSURANCE

## 2012-02-07 ENCOUNTER — Other Ambulatory Visit: Payer: Self-pay

## 2012-02-07 ENCOUNTER — Telehealth (HOSPITAL_COMMUNITY): Payer: Self-pay | Admitting: Oncology

## 2012-02-07 ENCOUNTER — Encounter (HOSPITAL_COMMUNITY): Payer: PRIVATE HEALTH INSURANCE | Admitting: Oncology

## 2012-02-07 ENCOUNTER — Other Ambulatory Visit (HOSPITAL_COMMUNITY): Payer: Self-pay | Admitting: Oncology

## 2012-02-07 ENCOUNTER — Encounter (HOSPITAL_COMMUNITY): Payer: Self-pay

## 2012-02-07 DIAGNOSIS — I2510 Atherosclerotic heart disease of native coronary artery without angina pectoris: Secondary | ICD-10-CM | POA: Diagnosis present

## 2012-02-07 DIAGNOSIS — I619 Nontraumatic intracerebral hemorrhage, unspecified: Secondary | ICD-10-CM | POA: Insufficient documentation

## 2012-02-07 DIAGNOSIS — E669 Obesity, unspecified: Secondary | ICD-10-CM | POA: Diagnosis present

## 2012-02-07 DIAGNOSIS — C9000 Multiple myeloma not having achieved remission: Secondary | ICD-10-CM

## 2012-02-07 DIAGNOSIS — K219 Gastro-esophageal reflux disease without esophagitis: Secondary | ICD-10-CM | POA: Diagnosis present

## 2012-02-07 DIAGNOSIS — M1990 Unspecified osteoarthritis, unspecified site: Secondary | ICD-10-CM | POA: Diagnosis present

## 2012-02-07 DIAGNOSIS — Z87891 Personal history of nicotine dependence: Secondary | ICD-10-CM

## 2012-02-07 DIAGNOSIS — N3281 Overactive bladder: Secondary | ICD-10-CM

## 2012-02-07 DIAGNOSIS — R531 Weakness: Secondary | ICD-10-CM

## 2012-02-07 DIAGNOSIS — R7309 Other abnormal glucose: Secondary | ICD-10-CM | POA: Diagnosis present

## 2012-02-07 DIAGNOSIS — Z9861 Coronary angioplasty status: Secondary | ICD-10-CM

## 2012-02-07 DIAGNOSIS — K3184 Gastroparesis: Secondary | ICD-10-CM | POA: Diagnosis present

## 2012-02-07 DIAGNOSIS — IMO0001 Reserved for inherently not codable concepts without codable children: Secondary | ICD-10-CM | POA: Diagnosis present

## 2012-02-07 DIAGNOSIS — Z7982 Long term (current) use of aspirin: Secondary | ICD-10-CM

## 2012-02-07 DIAGNOSIS — M6281 Muscle weakness (generalized): Secondary | ICD-10-CM | POA: Insufficient documentation

## 2012-02-07 DIAGNOSIS — E7800 Pure hypercholesterolemia, unspecified: Secondary | ICD-10-CM | POA: Diagnosis present

## 2012-02-07 DIAGNOSIS — G8190 Hemiplegia, unspecified affecting unspecified side: Secondary | ICD-10-CM | POA: Diagnosis present

## 2012-02-07 DIAGNOSIS — I10 Essential (primary) hypertension: Secondary | ICD-10-CM | POA: Diagnosis present

## 2012-02-07 DIAGNOSIS — Z85038 Personal history of other malignant neoplasm of large intestine: Secondary | ICD-10-CM

## 2012-02-07 DIAGNOSIS — K5900 Constipation, unspecified: Secondary | ICD-10-CM | POA: Diagnosis present

## 2012-02-07 DIAGNOSIS — G936 Cerebral edema: Secondary | ICD-10-CM | POA: Diagnosis present

## 2012-02-07 DIAGNOSIS — N393 Stress incontinence (female) (male): Secondary | ICD-10-CM | POA: Diagnosis present

## 2012-02-07 LAB — COMPREHENSIVE METABOLIC PANEL
ALT: 9 U/L (ref 0–35)
AST: 10 U/L (ref 0–37)
AST: 8 U/L (ref 0–37)
Albumin: 3.4 g/dL — ABNORMAL LOW (ref 3.5–5.2)
Albumin: 3.6 g/dL (ref 3.5–5.2)
BUN: 23 mg/dL (ref 6–23)
CO2: 27 mEq/L (ref 19–32)
Calcium: 9.3 mg/dL (ref 8.4–10.5)
Calcium: 9.6 mg/dL (ref 8.4–10.5)
Creatinine, Ser: 1.05 mg/dL (ref 0.50–1.10)
GFR calc non Af Amer: 47 mL/min — ABNORMAL LOW (ref >90)
Sodium: 142 mEq/L (ref 135–145)
Total Protein: 7 g/dL (ref 6.0–8.3)

## 2012-02-07 LAB — DIFFERENTIAL
Basophils Absolute: 0 10*3/uL (ref 0.0–0.1)
Eosinophils Relative: 10 % — ABNORMAL HIGH (ref 0–5)
Lymphocytes Relative: 21 % (ref 12–46)
Neutro Abs: 2.8 10*3/uL (ref 1.7–7.7)

## 2012-02-07 LAB — PROTIME-INR: INR: 1 (ref 0.00–1.49)

## 2012-02-07 LAB — CBC
HCT: 38.1 % (ref 36.0–46.0)
MCH: 29.5 pg (ref 26.0–34.0)
MCHC: 30.4 g/dL (ref 30.0–36.0)
MCV: 98.3 fL (ref 78.0–100.0)
MCV: 96.9 fL (ref 78.0–100.0)
Platelets: 224 10*3/uL (ref 150–400)
Platelets: 213 10*3/uL (ref 150–400)
RDW: 17.6 % — ABNORMAL HIGH (ref 11.5–15.5)
RDW: 17.3 % — ABNORMAL HIGH (ref 11.5–15.5)
WBC: 5.4 10*3/uL (ref 4.0–10.5)
WBC: 6 10*3/uL (ref 4.0–10.5)

## 2012-02-07 LAB — CBC WITH DIFFERENTIAL/PLATELET
HCT: 34.2 % — ABNORMAL LOW (ref 36.0–46.0)
Hemoglobin: 10.3 g/dL — ABNORMAL LOW (ref 12.0–15.0)
Lymphocytes Relative: 23 % (ref 12–46)
Monocytes Absolute: 1.1 10*3/uL — ABNORMAL HIGH (ref 0.1–1.0)
Monocytes Relative: 20 % — ABNORMAL HIGH (ref 3–12)
Neutro Abs: 2.4 10*3/uL (ref 1.7–7.7)
Neutrophils Relative %: 44 % (ref 43–77)
RBC: 3.46 MIL/uL — ABNORMAL LOW (ref 3.87–5.11)
WBC: 5.5 10*3/uL (ref 4.0–10.5)

## 2012-02-07 LAB — GLUCOSE, CAPILLARY: Glucose-Capillary: 98 mg/dL (ref 70–99)

## 2012-02-07 LAB — HEMOGLOBIN A1C
Hgb A1c MFr Bld: 6.1 % — ABNORMAL HIGH (ref <5.7)
Mean Plasma Glucose: 128 mg/dL — ABNORMAL HIGH (ref <117)

## 2012-02-07 LAB — APTT: aPTT: 25 seconds (ref 24–37)

## 2012-02-07 LAB — MRSA PCR SCREENING: MRSA by PCR: NEGATIVE

## 2012-02-07 MED ORDER — ONDANSETRON HCL 4 MG/2ML IJ SOLN: 4.0000 mg | Freq: Four times a day (QID) | INTRAMUSCULAR | Status: DC | PRN

## 2012-02-07 MED ORDER — ACETAMINOPHEN 650 MG RE SUPP: 650.0000 mg | RECTAL | Status: DC | PRN

## 2012-02-07 MED ORDER — ACETAMINOPHEN 500 MG PO TABS
500.0000 mg | ORAL_TABLET | Freq: Every evening | ORAL | Status: DC | PRN
  Filled 2012-02-07: qty 1

## 2012-02-07 MED ORDER — HEPARIN SOD (PORK) LOCK FLUSH 100 UNIT/ML IV SOLN
INTRAVENOUS | Status: AC
  Filled 2012-02-07: qty 5

## 2012-02-07 MED ORDER — HEPARIN SOD (PORK) LOCK FLUSH 100 UNIT/ML IV SOLN
500.0000 [IU] | Freq: Once | INTRAVENOUS | Status: AC
  Administered 2012-02-07: 500 [IU] via INTRAVENOUS
  Filled 2012-02-07: qty 5

## 2012-02-07 MED ORDER — SODIUM CHLORIDE 0.9 % IV SOLN
INTRAVENOUS | Status: DC
  Administered 2012-02-07: 75 mL/h via INTRAVENOUS

## 2012-02-07 MED ORDER — DIPHENHYDRAMINE-APAP (SLEEP) 25-500 MG PO TABS: 1.0000 | ORAL_TABLET | Freq: Every evening | ORAL | Status: DC | PRN

## 2012-02-07 MED ORDER — SODIUM CHLORIDE 0.9 % IJ SOLN
INTRAMUSCULAR | Status: AC
  Filled 2012-02-07: qty 10

## 2012-02-07 MED ORDER — DIPHENHYDRAMINE HCL 25 MG PO CAPS
25.0000 mg | ORAL_CAPSULE | Freq: Every evening | ORAL | Status: DC | PRN
  Filled 2012-02-07: qty 1

## 2012-02-07 MED ORDER — NEBIVOLOL HCL 2.5 MG PO TABS: 2.5000 mg | ORAL_TABLET | Freq: Two times a day (BID) | ORAL | Status: DC

## 2012-02-07 MED ORDER — OXYBUTYNIN CHLORIDE 5 MG PO TABS
5.0000 mg | ORAL_TABLET | Freq: Every day | ORAL | Status: DC
  Administered 2012-02-07 – 2012-02-10 (×4): 5 mg via ORAL
  Filled 2012-02-07 (×4): qty 1

## 2012-02-07 MED ORDER — HYDRALAZINE HCL 20 MG/ML IJ SOLN
10.0000 mg | INTRAMUSCULAR | Status: DC | PRN
  Administered 2012-02-07: 10 mg via INTRAVENOUS

## 2012-02-07 MED ORDER — IRBESARTAN 150 MG PO TABS
150.0000 mg | ORAL_TABLET | Freq: Every day | ORAL | Status: DC
  Administered 2012-02-07 – 2012-02-10 (×4): 150 mg via ORAL
  Filled 2012-02-07 (×4): qty 1

## 2012-02-07 MED ORDER — SENNOSIDES-DOCUSATE SODIUM 8.6-50 MG PO TABS
1.0000 | ORAL_TABLET | Freq: Two times a day (BID) | ORAL | Status: DC
  Administered 2012-02-07 – 2012-02-10 (×3): 1 via ORAL
  Filled 2012-02-07 (×8): qty 1

## 2012-02-07 MED ORDER — ACETAMINOPHEN 325 MG PO TABS
650.0000 mg | ORAL_TABLET | ORAL | Status: DC | PRN
  Administered 2012-02-08 – 2012-02-10 (×2): 650 mg via ORAL
  Filled 2012-02-07 (×3): qty 2

## 2012-02-07 MED ORDER — FUROSEMIDE 20 MG PO TABS
20.0000 mg | ORAL_TABLET | Freq: Every day | ORAL | Status: DC
  Administered 2012-02-07 – 2012-02-10 (×4): 20 mg via ORAL
  Filled 2012-02-07 (×4): qty 1

## 2012-02-07 MED ORDER — OXYCODONE-ACETAMINOPHEN 5-325 MG PO TABS
1.0000 | ORAL_TABLET | Freq: Four times a day (QID) | ORAL | Status: DC | PRN
  Administered 2012-02-09: 1 via ORAL
  Filled 2012-02-07: qty 1

## 2012-02-07 MED ORDER — HYDRALAZINE HCL 20 MG/ML IJ SOLN
10.0000 mg | INTRAMUSCULAR | Status: DC | PRN
  Administered 2012-02-08: 10 mg via INTRAVENOUS
  Filled 2012-02-07: qty 1

## 2012-02-07 MED ORDER — LORAZEPAM 0.5 MG PO TABS: 0.5000 mg | ORAL_TABLET | Freq: Four times a day (QID) | ORAL | Status: DC | PRN

## 2012-02-07 MED ORDER — HYDRALAZINE HCL 20 MG/ML IJ SOLN
INTRAMUSCULAR | Status: AC
  Filled 2012-02-07: qty 1

## 2012-02-07 MED ORDER — LABETALOL HCL 5 MG/ML IV SOLN: 10.0000 mg | INTRAVENOUS | Status: DC | PRN

## 2012-02-07 MED ORDER — PANTOPRAZOLE SODIUM 40 MG PO TBEC
40.0000 mg | DELAYED_RELEASE_TABLET | Freq: Every day | ORAL | Status: DC
  Administered 2012-02-07 – 2012-02-10 (×4): 40 mg via ORAL
  Filled 2012-02-07 (×4): qty 1

## 2012-02-07 MED ORDER — DEXAMETHASONE 6 MG PO TABS
20.0000 mg | ORAL_TABLET | ORAL | Status: DC
  Administered 2012-02-08: 20 mg via ORAL
  Filled 2012-02-07: qty 1

## 2012-02-07 MED ORDER — DEXAMETHASONE 2 MG PO TABS: 20.0000 mg | ORAL_TABLET | ORAL | Status: DC

## 2012-02-07 MED ORDER — SODIUM CHLORIDE 0.9 % IJ SOLN
10.0000 mL | INTRAMUSCULAR | Status: DC | PRN
  Administered 2012-02-07: 10 mL via INTRAVENOUS
  Filled 2012-02-07: qty 10

## 2012-02-07 MED ORDER — BISACODYL 5 MG PO TBEC
5.0000 mg | DELAYED_RELEASE_TABLET | Freq: Every day | ORAL | Status: DC | PRN
  Filled 2012-02-07: qty 1

## 2012-02-07 MED ORDER — PANTOPRAZOLE SODIUM 40 MG IV SOLR
40.0000 mg | Freq: Every day | INTRAVENOUS | Status: DC
  Filled 2012-02-07: qty 40

## 2012-02-07 NOTE — Progress Notes (Signed)
[name] called our clinic the middle of last week  And reported that while visiting her brother in the hospital at [facility].  She fell in the bathroom.  She did not report this to anyone.  She called the clinic a day or two later and reported that she was having difficulty getting out of bed and ambulating.  She was advised to go to the ED.  She did not follow through with that recommendation.  We followed up with the patient on Friday 02/04/2012 and she reported that she did not go to the ED.  She was asked to come to the clinic to be seen on Friday and she said that she would come as a work-in if she could get a ride.  She never showed up on Friday.  She was seen today for lab work as scheduled.  I was asked to see her as a work-in after the nurses spoke with [name] about her symptoms.     She is seen here today for scheduled lab work.  She has left-sided weakness in LE and UE.  She has decreased strength with knee flexion and extension, decreased strength in hip flexion and extension, decreased strength in elbow and shoulder extension and flexion. She has decreased strength in face as well with effects of the VII Facial nerve. Her affect is flat compared to her usual state.  She does not make a symmetrical smile.   The patient was on an ASA daily as directed.  He has a strong history of HTN and of course she is getting treated with Pomalyst for multiple myeloma with stability of disease.   There were no vitals taken for this visit. General: In wheelchair, accompanied by her son.  Not smiling like her usual self. HEENT: Atramautic, normocephalic Neck: Trachea midline Extremities: Left sided weakness noted with knee flexion/extension, hip flexion/extension, upper extremity flexion/extension Neuro: In addition to above, inability to flex facial muscles to develop symmetrical smile, therefore cranial nerve VII (Facial) affected.  Trigeminal intact seems to be intact.  Speech is slowed.  Mentation is  slowed. Skin: Warm and dry  Assessment: 1. 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  2. HTN, poorly controlled 3. IgG lambda multiple myeloma, actually with 2 M-spikes in the past, presented initially in 1997 with an monoclonal gammopathy of undetermined significance.   Plan: Patient's case was discussed with Dr. Mariel Sleet and Dr. Karilyn Cota.  Dr. Karilyn Cota agrees that admission is not warranted since our window of opportunity was missed.  We will perform a STAT CT of head without contrast, B/L US Carotids, 2-D echo, referral to neurology, Physical therapy at home.  CT of head without contrast report phoned to me.  Spoke with Dr. Constance Goltz.  [name] has a hemorrhagic stroke measuring approximately 2 x 1 cm without midline shift with mass effect.  Patient's US Doppler of Carotids cancelled and patient escorted to ED for consideration of transfer to stroke team.  Of note, patient is on ASA with a history of HTN.   *RADIOLOGY REPORT*  Clinical Data: Left-sided weakness since Friday. History of  myeloma. On chemotherapy.  CT HEAD WITHOUT CONTRAST  Technique: Contiguous axial images were obtained from the base of  the skull through the vertex without contrast.  Comparison: 08/18/2011.  Findings: 2.2 x 1.2 cm hemorrhage centered at the right globus  pallidus/genu of the right internal capsule with surrounding  vasogenic edema. Mild local mass effect with minimal impression  upon the right ventricle. Minimal bowing of  the septum but without  significant midline shift. Recommend follow-up until clearance.  No thrombotic infarct seen separate from this hemorrhage. Small  vessel disease type changes.  No intracranial mass lesion detected separate from the hemorrhage.  Cerebellar tonsils appear minimally low-lying but within range of  normal limits. No hydrocephalus.  Vascular calcifications.  Calvarial lucencies were noted previously majority of which appear  to represent arachnoid granulations  and vascular groove.  IMPRESSION:  2.2 x 1.2 cm hemorrhage centered at the right globus pallidus/genu  of the right internal capsule with surrounding vasogenic edema.  Mild local mass effect with minimal impression upon the right  ventricle. This may be related to a hypertensive hemorrhage or  coagulopathy from chemotherapy.  Please see above.  Critical Value/emergent results were called by telephone at the  time of interpretation on 02/07/2012 at 10:20 a.m. to [facility] physician's assistant, who verbally acknowledged these  results.  Original Report Authenticated By: Fuller Canada, M.D.    Will cancel outpatient neurology consultation and at home physical therapy since the patient is in ED with likely transfer to [facility] Stroke Team.  Dellis Anes

## 2012-02-07 NOTE — ED Provider Notes (Addendum)
History  This chart was scribed for Diane Hutching, MD by [name]. This patient was seen in room APA15/APA15 and the patient's care was started at 1023.  CSN: [id number]  Arrival date & time 02/07/12  1023  First MD Initiated Contact with Patient 02/07/12 1046     Chief Complaint  Patient presents with  . Cerebrovascular Accident   Patient is a 71 y.o. female presenting with Acute Neurological Problem. The history is provided by the patient. No language interpreter was used.  Cerebrovascular Accident This is a new problem. The current episode started more than 2 days ago. The problem occurs constantly. The problem has not changed since onset.Pertinent negatives include no chest pain, no abdominal pain and no shortness of breath. Nothing aggravates the symptoms. Nothing relieves the symptoms. She has tried nothing for the symptoms.   [name] is a 71 y.o. female who presents to the Emergency Department complaining of a fall 4 days ago where her left leg became weak, and has had constant left unilateral weakness since episode. She had appointment with PCP today who directed her here after concerning head CT scan which shows a brain bleed. She states weakness to left arm and leg but denies any changes in mentation or speech.   Her PCP is [name]  Past Medical History  Diagnosis Date  . Essential hypertension, benign   . Coronary atherosclerosis of native coronary artery     Former patient of [facility] - PTCA apical LAD 2005  . Type 2 diabetes mellitus     Borderline  . GERD (gastroesophageal reflux disease)   . Hypercholesteremia   . Female stress incontinence   . UTI (urinary tract infection)   . Vertigo   . Poor sleep pattern   . Colon cancer   . Multiple myeloma(203.0)   . Osteoarthritis   . Arrhythmia   . Constipation   . Gastroparesis 2009    mild delay of emptying with 30% retained at 2 hours    Past Surgical History  Procedure Date  . Colon surgery 1995    Resection,  [facility], colon cancer  . Abdominal hysterectomy 1970s    [facility]  . Knee arthroscopy 2003    LEFT, [facility]  . Heel spur surgery     KEELING, [facility]  . Portacath placement     [facility]   . Cervical spine surgery 2004    [facility]  . Port-a-cath removal 11/05/2010    Procedure: REMOVAL PORT-A-CATH;  Surgeon: Dalia Heading;  Location: [facility];  Service: General;  Laterality: Left;  started at 0846  . Portacath placement 11/05/2010    Procedure: INSERTION PORT-A-CATH;  Surgeon: Dalia Heading;  Location: [facility];  Service: General;  Laterality: Right;  Right subclavian, device left accessed   . Colonoscopy  07/02/2007    Normal rectum status post prior sigmoid resection with normal anastomosis identified at 35 cm.  Normal residual colon.Marland Kitchen Next TCS 06/2012  . Esophagogastroduodenoscopy   08/22/2007    Small hiatal hernia/A circular, purplish, 1-cm, distal esophageal submucosal lesion of uncertain significance, otherwise normal esophagus. EUS done as below.  Marland Kitchen Appendectomy 1953  . Eus 08/2007    esophageal bleb (aberrant blood vessel). retained gastric contents/food    Family History  Problem Relation Age of Onset  . Anesthesia problems Neg Hx   . Hypotension Neg Hx   . Malignant hyperthermia Neg Hx   . Pseudochol deficiency Neg Hx   . Lung disease Mother   .  Heart disease Father   . Heart disease Brother   . Hypertension Brother   . Arthritis Daughter   . Seizures Daughter   . Multiple sclerosis Son   . Colon cancer Brother 38    History  Substance Use Topics  . Smoking status: Former Smoker -- 0.2 packs/Ray    Types: Cigarettes    Quit date: 11/03/1990  . Smokeless tobacco: Never Used  . Alcohol Use: No    OB History    Grav Para Term Preterm Abortions TAB SAB Ect Mult Living                  Review of Systems  Constitutional: Negative for fever and chills.  HENT: Negative for congestion.   Respiratory: Negative for shortness of breath.   Cardiovascular: Negative for chest pain.    Gastrointestinal: Negative for nausea, vomiting and abdominal pain.  Musculoskeletal: Negative for back pain.  Neurological: Positive for weakness (left arm/leg weakness). Negative for speech difficulty.  Psychiatric/Behavioral: Negative for confusion.  All other systems reviewed and are negative.    Allergies  Gabapentin and Simvastatin  Home Medications   Current Outpatient Rx  Name Route Sig Dispense Refill  . ASPIRIN 81 MG PO TBEC Oral Take 81 mg by mouth every morning.      . DULCOLAX PO Oral Take 2 tablets by mouth daily as needed. Constipation    . VITAMIN D 1000 UNITS PO TABS Oral Take 1,000 Units by mouth daily.    Marland Kitchen DEXAMETHASONE 4 MG PO TABS Oral Take 20 mg by mouth once a week. Wednesday    . DIOVAN 160 MG PO TABS Oral Take 1 tablet by mouth 2 (two) times daily.    Marland Kitchen DIPHENHYDRAMINE-APAP (SLEEP) 25-500 MG PO TABS Oral Take 1 tablet by mouth at bedtime as needed. Pain/Sleep    . FUROSEMIDE 20 MG PO TABS Oral Take 20 mg by mouth as needed. Fluid Retention    . LORAZEPAM 0.5 MG PO TABS Oral Take 1 tablet (0.5 mg total) by mouth every 6 (six) hours as needed (nausea and vomiting). 30 tablet 1  . NEBIVOLOL HCL 5 MG PO TABS Oral Take 2.5-10 mg by mouth 2 (two) times daily. Take 10 mg in the am and take 2.5 mg in the pm by mouth daily.    Marland Kitchen OMEPRAZOLE 20 MG PO CPDR Oral Take 20 mg by mouth 2 (two) times daily.     . OXYBUTYNIN CHLORIDE 5 MG PO TABS Oral Take 1 tablet (5 mg total) by mouth daily. 30 tablet 3  . OXYCODONE-ACETAMINOPHEN 5-325 MG PO TABS Oral Take 1 tablet by mouth every 6 (six) hours as needed. pain    . POMALIDOMIDE 4 MG PO CAPS  Take 1 tablet, PO daily x 21 days with 7 Ray respite (starting on 02/13/2012) 21 capsule 1  . ROSUVASTATIN CALCIUM 20 MG PO TABS Oral Take 20 mg by mouth at bedtime.        Triage Vitals: BP 147/68  Pulse 45  Temp 97.7 F (36.5 C) (Oral)  Resp 20  Ht 5\' 5"  (1.651 m)  Wt 220 lb (99.791 kg)  BMI 36.61 kg/m2  SpO2 92%  Physical  Exam  Nursing note and vitals reviewed. Constitutional: She is oriented to person, place, and time. She appears well-developed and well-nourished.  HENT:  Head: Normocephalic and atraumatic.  Eyes: Conjunctivae normal and EOM are normal. Pupils are equal, round, and reactive to light.  Neck: Normal range of motion.  Neck supple.  Cardiovascular: Normal rate, regular rhythm and normal heart sounds.   Pulmonary/Chest: Effort normal and breath sounds normal. No respiratory distress.  Abdominal: Soft. Bowel sounds are normal.  Musculoskeletal: Normal range of motion.       3-4+ Pitting edema bilaterally  Neurological: She is alert and oriented to person, place, and time.       She moves her left arm slowly and cannot pick up her left leg. She moves both her right arm and right leg normally.   Skin: Skin is warm and dry.  Psychiatric: She has a normal mood and affect.    ED Course  Procedures (including critical care time) DIAGNOSTIC STUDIES: Oxygen Saturation is 921% on room air, adequate by my interpretation.    COORDINATION OF CARE: At 1045 AM Discussed treatment plan with patient which includes blood work. Patient agrees.   Labs Reviewed - No data to display Ct Head Wo Contrast  02/07/2012  *RADIOLOGY REPORT*  Clinical Data: Left-sided weakness since Friday.  History of myeloma.  On chemotherapy.  CT HEAD WITHOUT CONTRAST  Technique:  Contiguous axial images were obtained from the base of the skull through the vertex without contrast.  Comparison: 08/18/2011.  Findings: 2.2 x 1.2 cm hemorrhage centered at the right globus pallidus/genu of the right internal capsule with surrounding vasogenic edema. Mild local mass effect with minimal impression upon the right ventricle.  Minimal bowing of the septum but without significant midline shift. Recommend follow-up until clearance.  No thrombotic infarct seen separate from this hemorrhage.  Small vessel disease type changes.  No intracranial mass  lesion detected separate from the hemorrhage.  Cerebellar tonsils appear minimally low-lying but within range of normal limits.  No hydrocephalus.  Vascular calcifications.  Calvarial lucencies were noted previously majority of which appear to represent arachnoid granulations and vascular groove.  IMPRESSION: 2.2 x 1.2 cm hemorrhage centered at the right globus pallidus/genu of the right internal capsule with surrounding vasogenic edema. Mild local mass effect with minimal impression upon the right ventricle. This may be related to a hypertensive hemorrhage or coagulopathy from chemotherapy.  Please see above.  Critical Value/emergent results were called by telephone at the time of interpretation on 02/07/2012 at 10:20 a.m. to [facility] physician's assistant, who verbally acknowledged these results.   Original Report Authenticated By: Fuller Canada, M.D.     Date: 02/07/2012  Rate: 43  Rhythm: sinus bradycardia  QRS Axis: normal  Intervals: normal  ST/T Wave abnormalities: normal  Conduction Disutrbances:none  Narrative Interpretation:   Old EKG Reviewed: none available   No diagnosis found.  CRITICAL CARE Performed by: [name]   Total critical care time: 30  Critical care time was exclusive of separately billable procedures and treating other patients.  Critical care was necessary to treat or prevent imminent or life-threatening deterioration.  Critical care was time spent personally by me on the following activities: development of treatment plan with patient and/or surrogate as well as nursing, discussions with consultants, evaluation of patient's response to treatment, examination of patient, obtaining history from patient or surrogate, ordering and performing treatments and interventions, ordering and review of laboratory studies, ordering and review of radiographic studies, pulse oximetry and re-evaluation of patient's condition.  MDM   Obvious left leg greater than left  arm weakness.  CT scan shows 2.2 x 1.2 cm hemorrhagic stroke in the right globus pallidus area.  Patient is hemodynamically stable.  Discussed with Dr. Thad Ranger, neurologist in [place].  Will accept  in transfer.      I personally performed the services described in this documentation, which was scribed in my presence. The recorded information has been reviewed and considered.          Diane Hutching, MD 02/07/12 1304  Diane Hutching, MD 02/07/12 1305  Diane Hutching, MD 02/21/12 2212

## 2012-02-07 NOTE — ED Notes (Signed)
EKG given to Dr. Adriana Simas, Dr. Adriana Simas aware of pt.

## 2012-02-07 NOTE — Progress Notes (Signed)
[name] presented for Portacath access and flush.  Proper placement of portacath confirmed by CXR.  Portacath located right chest wall accessed with  H 20 needle.  Good blood return present. Portacath flushed with 20ml NS and 500U/29ml Heparin and needle removed intact.  Procedure without incident.  Patient tolerated procedure well.  [name] also reports left sided weakness and a left facial droop is noted - T. Kefalas, PA-C made aware and is following.

## 2012-02-07 NOTE — ED Notes (Signed)
Pt states Friday she noticed left sided weakness, was visiting a family member in hospital, when weakness became worse, and pt states fell. Pt has noted slurred speech at times and increased stuttering. Also noted left sided weakness predominately in left lower extremity.  Hand grasps are equal at this time. +3 pitting edema in rt lower extremity and + 4 pitting edema in left lower extremity, bilateral pulses equal x 4.  Pt is alert and oriented x 4, skin pink warm and dry to touch. Family at bedside. Dr Adriana Simas at bedside, examined pt and discussed plan of care with said pt. NAD noted at this time, nor were and needs voiced .

## 2012-02-07 NOTE — H&P (Signed)
[facility] STROKE CONSULT NOTE       Chief Complaint: stroke   HPI:    [name] is an 71 y.o. female who went to [facility] hospital last Friday to see her brother.  While she was in the hospital she fell in the bathroom.  She states she was generally weak. She noted Saturday her left leg and arm were weaker than usual.  This continued to today.  When she went to her Oncologist she mentioned her weakness and he obtained a CT head.  CT head showed a 2.2 x 1.2 cm hemorrhage centered at the right globus pallidus/genu  of the right internal capsule with surrounding vasogenic edema. Patient was transferred to [facility] for admission.    LSN: friday tPA Given: No: [facility]    Past Medical History  Diagnosis Date  . Essential hypertension, benign   . Coronary atherosclerosis of native coronary artery     Former patient of [facility] - PTCA apical LAD 2005  . Type 2 diabetes mellitus     Borderline  . GERD (gastroesophageal reflux disease)   . Hypercholesteremia   . Female stress incontinence   . UTI (urinary tract infection)   . Vertigo   . Poor sleep pattern   . Colon cancer   . Multiple myeloma(203.0)   . Osteoarthritis   . Arrhythmia   . Constipation   . Gastroparesis 2009    mild delay of emptying with 30% retained at 2 hours    Past Surgical History  Procedure Date  . Colon surgery 1995    Resection, [facility], colon cancer  . Abdominal hysterectomy 1970s    [facility]  . Knee arthroscopy 2003    LEFT, [facility]  . Heel spur surgery     KEELING, [facility]  . Portacath placement     [facility]   . Cervical spine surgery 2004    [facility]  . Port-a-cath removal 11/05/2010    Procedure: REMOVAL PORT-A-CATH;  Surgeon: Dalia Heading;  Location: [facility];  Service: General;  Laterality: Left;  started at 0846  . Portacath placement 11/05/2010    Procedure: INSERTION PORT-A-CATH;  Surgeon: Dalia Heading;  Location: [facility];  Service: General;  Laterality: Right;  Right  subclavian, device left accessed   . Colonoscopy  07/02/2007    Normal rectum status post prior sigmoid resection with normal anastomosis identified at 35 cm.  Normal residual colon.Marland Kitchen Next TCS 06/2012  . Esophagogastroduodenoscopy   08/22/2007    Small hiatal hernia/A circular, purplish, 1-cm, distal esophageal submucosal lesion of uncertain significance, otherwise normal esophagus. EUS done as below.  Marland Kitchen Appendectomy 1953  . Eus 08/2007    esophageal bleb (aberrant blood vessel). retained gastric contents/food    Family History  Problem Relation Age of Onset  . Anesthesia problems Neg Hx   . Hypotension Neg Hx   . Malignant hyperthermia Neg Hx   . Pseudochol deficiency Neg Hx   . Lung disease Mother   . Heart disease Father   . Heart disease Brother   . Hypertension Brother   . Arthritis Daughter   . Seizures Daughter   . Multiple sclerosis Son   . Colon cancer Brother 40   Social History:  reports that she quit smoking about 21 years ago. Her smoking use included Cigarettes. She smoked .25 packs per day. She has never used smokeless tobacco. She reports that she does  not drink alcohol or use illicit drugs.  Allergies:  Allergies  Allergen Reactions  . Gabapentin Other (See Comments)    Difficulty with speech  . Simvastatin     Leg Cramps     Medications:    Prior to Admission:  Prescriptions prior to admission  Medication Sig Dispense Refill  . aspirin 81 MG EC tablet Take 81 mg by mouth every morning.        . cholecalciferol (VITAMIN D) 1000 UNITS tablet Take 1,000 Units by mouth daily.      Marland Kitchen dexamethasone (DECADRON) 4 MG tablet Take 20 mg by mouth once a week. Wednesday      . DIOVAN 160 MG tablet Take 1 tablet by mouth 2 (two) times daily.      . furosemide (LASIX) 20 MG tablet Take 20 mg by mouth as needed. Fluid Retention      . LORazepam (ATIVAN) 0.5 MG tablet Take 1 tablet (0.5 mg total) by mouth every 6 (six) hours as needed (nausea and vomiting).  30 tablet  1   . nebivolol (BYSTOLIC) 5 MG tablet Take 2.5-10 mg by mouth 2 (two) times daily. Take 10 mg in the am and take 2.5 mg in the pm by mouth daily.      Marland Kitchen omeprazole (PRILOSEC) 20 MG capsule Take 20 mg by mouth 2 (two) times daily.       Marland Kitchen oxybutynin (DITROPAN) 5 MG tablet Take 1 tablet (5 mg total) by mouth daily.  30 tablet  3  . oxyCODONE-acetaminophen (PERCOCET/ROXICET) 5-325 MG per tablet Take 1 tablet by mouth every 6 (six) hours as needed. pain      . rosuvastatin (CRESTOR) 20 MG tablet Take 20 mg by mouth at bedtime.        . Bisacodyl (DULCOLAX PO) Take 2 tablets by mouth daily as needed. Constipation      . diphenhydramine-acetaminophen (TYLENOL PM) 25-500 MG TABS Take 1 tablet by mouth at bedtime as needed. Pain/Sleep      . pomalidomide (POMALYST) 4 MG capsule Take 1 tablet, PO daily x 21 days with 7 day respite (starting on 02/13/2012)  21 capsule  1   Scheduled:   . dexamethasone  20 mg Oral Weekly  . furosemide  20 mg Oral Daily  . hydrALAZINE      . irbesartan  150 mg Oral Daily  . oxybutynin  5 mg Oral Daily  . pantoprazole  40 mg Oral Daily  . pantoprazole (PROTONIX) IV  40 mg Intravenous QHS  . senna-docusate  1 tablet Oral BID  . DISCONTD: dexamethasone  20 mg Oral Weekly  . DISCONTD: nebivolol  2.5-10 mg Oral BID    ROS: History obtained from the patient  General ROS: negative for - chills, fatigue, fever, night sweats, weight gain or weight loss Psychological ROS: negative for - behavioral disorder, hallucinations, memory difficulties, mood swings or suicidal ideation Ophthalmic ROS: negative for - blurry vision, double vision, eye pain or loss of vision ENT ROS: negative for - epistaxis, nasal discharge, oral lesions, sore throat, tinnitus or vertigo Allergy and Immunology ROS: negative for - hives or itchy/watery eyes Hematological and Lymphatic ROS: negative for - bleeding problems, bruising or swollen lymph nodes Endocrine ROS: negative for - galactorrhea, hair  pattern changes, polydipsia/polyuria or temperature intolerance Respiratory ROS: negative for - cough, hemoptysis, shortness of breath or wheezing Cardiovascular ROS: negative for - chest pain, dyspnea on exertion, edema or irregular heartbeat Gastrointestinal ROS: negative for - abdominal  pain, diarrhea, hematemesis, nausea/vomiting or stool incontinence Genito-Urinary ROS: negative for - dysuria, hematuria, incontinence or urinary frequency/urgency Musculoskeletal ROS: negative for - joint swelling or muscular weakness Neurological ROS: as noted in HPI Dermatological ROS: negative for rash and skin lesion changes   Physical Examination: Blood pressure 193/78, pulse 44, temperature 98.2 F (36.8 C), temperature source Oral, resp. rate 17, height 5\' 5"  (1.651 m), weight 102.1 kg (225 lb 1.4 oz), SpO2 99.00%.  Neurologic Examination:   Mental Status: Alert, oriented, thought content appropriate.  Speech fluent without evidence of aphasia.  Able to follow 3 step commands without difficulty. Cranial Nerves: II: Discs flat bilaterally; Visual fields grossly normal, pupils equal, round, reactive to light and accommodation III,IV, VI: ptosis not present, extra-ocular motions intact bilaterally V,VII: smile symmetric, facial light touch sensation normal bilaterally VIII: hearing normal bilaterally IX,X: gag reflex present XI: bilateral shoulder shrug XII: midline tongue extension Motor: Right : Upper extremity   5/5    Left:     Upper extremity   4/5  Lower extremity   5/5     Lower extremity   1-2/5 Positive drift on the left UE and LE Tone and bulk:normal tone throughout; no atrophy noted Sensory: Pinprick and light touch intact throughout, bilaterally Deep Tendon Reflexes: 1+ and symmetric throughout Plantars: Equivocal bilaterally Cerebellar: normal finger-to-nose,  normal heel-to-shin test on the right CV: pulses palpable throughout    Lab Results  Component Value Date/Time    CHOL 150 01/20/2011 10:34 AM    Results for orders placed during the hospital encounter of 02/07/12 (from the past 48 hour(s))  CBC WITH DIFFERENTIAL     Status: Abnormal   Collection Time   02/07/12  8:35 AM      Component Value Range Comment   WBC 5.5  4.0 - 10.5 K/uL    RBC 3.46 (*) 3.87 - 5.11 MIL/uL    Hemoglobin 10.3 (*) 12.0 - 15.0 g/dL    HCT 24.4 (*) 01.0 - 46.0 %    MCV 98.8  78.0 - 100.0 fL    MCH 29.8  26.0 - 34.0 pg    MCHC 30.1  30.0 - 36.0 g/dL    RDW 27.2 (*) 53.6 - 15.5 %    Platelets 212  150 - 400 K/uL    Neutrophils Relative 44  43 - 77 %    Neutro Abs 2.4  1.7 - 7.7 K/uL    Lymphocytes Relative 23  12 - 46 %    Lymphs Abs 1.3  0.7 - 4.0 K/uL    Monocytes Relative 20 (*) 3 - 12 %    Monocytes Absolute 1.1 (*) 0.1 - 1.0 K/uL    Eosinophils Relative 13 (*) 0 - 5 %    Eosinophils Absolute 0.7  0.0 - 0.7 K/uL    Basophils Relative 0  0 - 1 %    Basophils Absolute 0.0  0.0 - 0.1 K/uL   PROTIME-INR     Status: Normal   Collection Time   02/07/12  8:35 AM      Component Value Range Comment   Prothrombin Time 13.1  11.6 - 15.2 seconds    INR 1.00  0.00 - 1.49   APTT     Status: Normal   Collection Time   02/07/12  8:35 AM      Component Value Range Comment   aPTT 25  24 - 37 seconds     Ct Head Wo Contrast  02/07/2012  *RADIOLOGY  REPORT*  Clinical Data: Left-sided weakness since Friday.  History of myeloma.  On chemotherapy.  CT HEAD WITHOUT CONTRAST  Technique:  Contiguous axial images were obtained from the base of the skull through the vertex without contrast.  Comparison: 08/18/2011.  Findings: 2.2 x 1.2 cm hemorrhage centered at the right globus pallidus/genu of the right internal capsule with surrounding vasogenic edema. Mild local mass effect with minimal impression upon the right ventricle.  Minimal bowing of the septum but without significant midline shift. Recommend follow-up until clearance.  No thrombotic infarct seen separate from this hemorrhage.   Small vessel disease type changes.  No intracranial mass lesion detected separate from the hemorrhage.  Cerebellar tonsils appear minimally low-lying but within range of normal limits.  No hydrocephalus.  Vascular calcifications.  Calvarial lucencies were noted previously majority of which appear to represent arachnoid granulations and vascular groove.  IMPRESSION: 2.2 x 1.2 cm hemorrhage centered at the right globus pallidus/genu of the right internal capsule with surrounding vasogenic edema. Mild local mass effect with minimal impression upon the right ventricle. This may be related to a hypertensive hemorrhage or coagulopathy from chemotherapy.  Please see above.  Critical Value/emergent results were called by telephone at the time of interpretation on 02/07/2012 at 10:20 a.m. to [facility] physician's assistant, who verbally acknowledged these results.   Original Report Authenticated By: Fuller Canada, M.D.      EKG NSR  Assessment:    71 y.o. female presenting with left sided weakness and a right basal ganglion hemorrhage.  Imaging shows no evidence of midline shift.  Most likely related to hypertension.  Patient not a surgical candidate.  BP elevated.    Stroke Risk Factors - diabetes mellitus, hyperlipidemia, hypertension and arrhythmia  PLAN: 1. HgbA1c, fasting lipid panel 2. MRI, MRA  of the brain without contrast 3. PT consult, OT consult, Speech consult 4. Echocardiogram 5. Carotid dopplers 6. Prophylactic therapy-None 7. Risk factor modification.  BP control with target SBP < 160.    Felicie Morn PA-C [facility] [phone]  02/07/2012, 3:31 PM    Patient seen and examined.  Clinical course and management discussed.  Necessary edits performed.  I agree with the above.  Thana Farr, MD [facility] [phone]  02/07/2012  5:50 PM

## 2012-02-07 NOTE — Patient Instructions (Addendum)
[facility]  Discharge Instructions  RECOMMENDATIONS MADE BY THE CONSULTANT AND ANY TEST RESULTS WILL BE SENT TO YOUR REFERRING DOCTOR.   EXAM FINDINGS BY MD TODAY AND SIGNS AND SYMPTOMS TO REPORT TO CLINIC OR PRIMARY MD: It appears that you have had a stroke. We need to get you back in to see the neurologist in [place].    INSTRUCTIONS GIVEN AND DISCUSSED: X-ray today--CT of head, u/s of bilateral carotids We will get a 2d echo this week  SPECIAL INSTRUCTIONS/FOLLOW-UP: Home health for physical therapy at home--we will make referral and call you to let you know when they are coming.   I acknowledge that I have been informed and understand all the instructions given to me and received a copy. I do not have any more questions at this time, but understand that I may call the Specialty Clinic at [facility] at [phone] during business hours should I have any further questions or need assistance in obtaining follow-up care.    __________________________________________  _____________  __________ Signature of Patient or Authorized Representative            Date                   Time    __________________________________________ Nurse's Signature

## 2012-02-07 NOTE — Telephone Encounter (Signed)
Per Elenora Gamma pt is being transferred to [facility] for a hemorrhagic stroke and there is no need to refer her to home health or To [facility] Neurologic Dr Seth Bake. [place] [phone]).(pt has seen this dr before)

## 2012-02-07 NOTE — ED Notes (Signed)
Pt stated she fell in bathroom on 3rd floor of hospital Friday night around 9pm, "legs just went weak on the left", while she was in the bathroom, went home and was having trouble standing all weekend, hard to move the left side, and had trouble standing. Today she had appointment with pmd told him of her symptoms and had ct scan. Brought directly from xray department. Pt denies any complaints at present.

## 2012-02-08 ENCOUNTER — Encounter (HOSPITAL_COMMUNITY): Payer: Self-pay | Admitting: Nurse Practitioner

## 2012-02-08 DIAGNOSIS — I6350 Cerebral infarction due to unspecified occlusion or stenosis of unspecified cerebral artery: Secondary | ICD-10-CM

## 2012-02-08 DIAGNOSIS — I619 Nontraumatic intracerebral hemorrhage, unspecified: Secondary | ICD-10-CM | POA: Diagnosis present

## 2012-02-08 DIAGNOSIS — I6789 Other cerebrovascular disease: Secondary | ICD-10-CM

## 2012-02-08 LAB — LIPID PANEL
HDL: 49 mg/dL (ref >39)
LDL Cholesterol: 74 mg/dL (ref 0–99)
Triglycerides: 93 mg/dL (ref <150)

## 2012-02-08 LAB — GLUCOSE, CAPILLARY: Glucose-Capillary: 108 mg/dL — ABNORMAL HIGH (ref 70–99)

## 2012-02-08 MED ORDER — ATORVASTATIN CALCIUM 40 MG PO TABS
40.0000 mg | ORAL_TABLET | Freq: Every day | ORAL | Status: DC
  Administered 2012-02-08 – 2012-02-09 (×2): 40 mg via ORAL
  Filled 2012-02-08 (×3): qty 1

## 2012-02-08 MED ORDER — AMLODIPINE BESYLATE 5 MG PO TABS
5.0000 mg | ORAL_TABLET | Freq: Every day | ORAL | Status: DC
  Administered 2012-02-08 – 2012-02-10 (×3): 5 mg via ORAL
  Filled 2012-02-08 (×3): qty 1

## 2012-02-08 MED ORDER — ENOXAPARIN SODIUM 40 MG/0.4ML ~~LOC~~ SOLN
40.0000 mg | SUBCUTANEOUS | Status: DC
  Administered 2012-02-08 – 2012-02-10 (×3): 40 mg via SUBCUTANEOUS
  Filled 2012-02-08 (×3): qty 0.4

## 2012-02-08 NOTE — Evaluation (Signed)
Occupational Therapy Evaluation Patient Details Name: [name] MRN: [id number] DOB: Mar 05, 1941 Today's Date: 02/08/2012 Time: 4098-1191 OT Time Calculation (min): 31 min  OT Assessment / Plan / Recommendation Clinical Impression  Pt admitted with lt sided weakness with MRI findings of: hemorrhage in anterior right thalamus. Pt presents with left hemiparesis and sensory deficits. Pt will benefit from skilled OT in the acute setting to maximize I with ADL and ADL mobility prior to d/c.    OT Assessment  Patient needs continued OT Services    Follow Up Recommendations  Home health OT;Supervision/Assistance - 24 hour    Barriers to Discharge      Equipment Recommendations  None recommended by OT;None recommended by PT    Recommendations for Other Services    Frequency  Min 3X/week    Precautions / Restrictions Precautions Precautions: Fall Restrictions Weight Bearing Restrictions: No   Pertinent Vitals/Pain Pt reports bil LE (calves and distally) pain. Pt states present prior to stroke.     ADL  Grooming: Performed;Wash/dry face;Wash/dry hands;Set up Where Assessed - Grooming: Supported sitting Lower Body Bathing: Simulated;Minimal assistance Where Assessed - Lower Body Bathing: Supported sit to stand Lower Body Dressing: Performed;Moderate assistance Where Assessed - Lower Body Dressing: Supported sit to Pharmacist, hospital: Performed;Minimal Dentist Method: Surveyor, minerals: Materials engineer and Hygiene: Performed;Min guard Where Assessed - Engineer, mining and Hygiene: Standing Equipment Used: Rolling walker Transfers/Ambulation Related to ADLs: Min A with RW ambulation throughout room and into hall ADL Comments: fearful of falling. pt educated on increased sensory input on LUE    OT Diagnosis: Generalized weakness;Acute pain;Paresis  OT Problem List: Decreased activity  tolerance;Impaired balance (sitting and/or standing);Decreased strength;Decreased knowledge of precautions;Decreased knowledge of use of DME or AE;Impaired UE functional use;Pain;Increased edema OT Treatment Interventions: Self-care/ADL training;DME and/or AE instruction;Therapeutic activities;Patient/family education;Balance training   OT Goals Acute Rehab OT Goals OT Goal Formulation: With patient Time For Goal Achievement: 02/15/12 Potential to Achieve Goals: Good ADL Goals Pt Will Perform Grooming: Independently;Standing at sink ADL Goal: Grooming - Progress: Goal set today Pt Will Perform Upper Body Bathing: with set-up;Sitting at sink ADL Goal: Upper Body Bathing - Progress: Goal set today Pt Will Perform Lower Body Bathing: with set-up;Standing at sink;Sitting at sink ADL Goal: Lower Body Bathing - Progress: Goal set today Pt Will Perform Upper Body Dressing: Independently;Sitting, bed;Sitting, chair ADL Goal: Upper Body Dressing - Progress: Goal set today Pt Will Perform Lower Body Dressing: with min assist;Sit to stand from bed;Sit to stand from chair ADL Goal: Lower Body Dressing - Progress: Goal set today Pt Will Transfer to Toilet: with modified independence;Ambulation;with DME ADL Goal: Toilet Transfer - Progress: Goal set today Pt Will Perform Toileting - Clothing Manipulation: with modified independence;Standing ADL Goal: Toileting - Clothing Manipulation - Progress: Goal set today  Visit Information  Last OT Received On: 02/08/12 Assistance Needed: +1    Subjective Data  Subjective: What you wanna do, baby? Patient Stated Goal: Return home   Prior Functioning     Home Living Lives With: Family;Son Available Help at Discharge: Family Type of Home: House Home Access: Level entry Home Layout: One level Bathroom Shower/Tub:  (sponge bath) Bathroom Toilet: Handicapped height Home Adaptive Equipment: Walker - rolling;Bedside commode/3-in-1;Straight cane Prior  Function Level of Independence: Needs assistance (but could walk around the house) Driving: No Communication Communication: No difficulties Dominant Hand: Left         Vision/Perception  Cognition  Overall Cognitive Status: Appears within functional limits for tasks assessed/performed Arousal/Alertness: Awake/alert Orientation Level: Oriented X4 / Intact;Appears intact for tasks assessed;Other (comment) Behavior During Session: [facility] for tasks performed    Extremity/Trunk Assessment Right Upper Extremity Assessment RUE ROM/Strength/Tone: Within functional levels RUE Sensation: WFL - Light Touch RUE Coordination: WFL - gross/fine motor Left Upper Extremity Assessment LUE ROM/Strength/Tone: Deficits LUE ROM/Strength/Tone Deficits: grossly 4/5 LUE Sensation: Deficits LUE Sensation Deficits: diminished light touch and pinprick LUE Coordination: Deficits LUE Coordination Deficits: mild deficits- slow purposeful movement; requires vision for accuracy.  Right Lower Extremity Assessment RLE ROM/Strength/Tone: [facility] for tasks assessed Left Lower Extremity Assessment LLE ROM/Strength/Tone: Deficits LLE ROM/Strength/Tone Deficits: quads 3/5, hams 3-/5, hip flexors 2+/5 df/pf 3+/5 LLE Sensation: WFL - Light Touch LLE Coordination: Deficits LLE Coordination Deficits:  moves synergistically Trunk Assessment Trunk Assessment: Normal     Mobility Bed Mobility Bed Mobility: Not assessed Transfers Sit to Stand: 4: Min assist;With armrests;From chair/3-in-1 Stand to Sit: 4: Min guard;With armrests;To chair/3-in-1 Details for Transfer Assistance: vc's for hand placement; minimal stability assist initially     Shoulder Instructions     Exercise     Balance Balance Balance Assessed: Yes Static Sitting Balance Static Sitting - Balance Support: Feet supported;Right upper extremity supported;Left upper extremity supported Static Sitting - Level of Assistance: 5: Stand by assistance    End of Session OT - End of Session Activity Tolerance: Patient tolerated treatment well Patient left: in chair;with call bell/phone within reach Nurse Communication: Mobility status  GO     [name] 02/08/2012, 3:25 PM

## 2012-02-08 NOTE — Progress Notes (Signed)
UR completed 

## 2012-02-08 NOTE — Progress Notes (Signed)
Patient is urinating frequently (on lasix) and requests a foley to get sleep.  Paged Dr. Amada Jupiter and received orders to place foley.  Foley placed at this time with >350cc of clear urine out.  Balloon inflated to 15cc due to leakage around the catheter.  Will continue to monitor.

## 2012-02-08 NOTE — Progress Notes (Signed)
  Echocardiogram 2D Echocardiogram has been performed.  Raylan Hanton 02/08/2012, 10:50 AM

## 2012-02-08 NOTE — Progress Notes (Signed)
*  PRELIMINARY RESULTS* Vascular Ultrasound Carotid Duplex (Doppler) has been completed.  Preliminary findings: Bilateral: 40-59% ICA stenosis. Vertebral artery flow is antegrade.  Cathie Beams 02/08/2012, 5:08 PM

## 2012-02-08 NOTE — Evaluation (Signed)
Physical Therapy Evaluation Patient Details Name: [name] MRN: [id number] DOB: 1941/02/03 Today's Date: 02/08/2012 Time: 1478-2956 PT Time Calculation (min): 31 min  PT Assessment / Plan / Recommendation Clinical Impression  pt admitted with L sided weakness. CT showed  hemorrhage in R globus pallidus.  Mobility limited by weakness and decr coordination.  Will benefit from PT to improve functional Independence.    PT Assessment  Patient needs continued PT services    Follow Up Recommendations  Home health PT;Supervision for mobility/OOB    Does the patient have the potential to tolerate intense rehabilitation      Barriers to Discharge        Equipment Recommendations  None recommended by PT    Recommendations for Other Services     Frequency Min 3X/week    Precautions / Restrictions Precautions Precautions: Fall Restrictions Weight Bearing Restrictions: No   Pertinent Vitals/Pain       Mobility  Bed Mobility Bed Mobility: Not assessed Transfers Transfers: Sit to Stand;Stand to Sit Sit to Stand: 4: Min assist;With armrests;From chair/3-in-1 Stand to Sit: 4: Min guard;With armrests;To chair/3-in-1 Details for Transfer Assistance: vc's for hand placement; minimal stability assist initially Ambulation/Gait Ambulation/Gait Assistance: 4: Min assist Ambulation Distance (Feet): 24 Feet Assistive device: Rolling walker Ambulation/Gait Assistance Details: mildly HP gait on L, short unequal step length with L trailing behind generally.  Poor use of the RW/ Gait Pattern: Step-to pattern;Step-through pattern;Decreased step length - right;Decreased step length - left;Decreased stride length;Trunk flexed Gait velocity: slow cadence Stairs: No Wheelchair Mobility Wheelchair Mobility: No Modified Rankin (Stroke Patients Only) Pre-Morbid Rankin Score: No symptoms Modified Rankin: Moderately severe disability    Shoulder Instructions     Exercises     PT  Diagnosis: Abnormality of gait;Other (comment) (Hemiparesis)  PT Problem List: Decreased strength;Decreased activity tolerance;Decreased balance;Decreased mobility;Decreased coordination;Decreased knowledge of use of DME;Pain PT Treatment Interventions: DME instruction;Gait training;Functional mobility training;Therapeutic activities;Balance training;Patient/family education   PT Goals Acute Rehab PT Goals PT Goal Formulation: With patient Time For Goal Achievement: 02/22/12 Potential to Achieve Goals: Good Pt will go Supine/Side to Sit: with supervision PT Goal: Supine/Side to Sit - Progress: Goal set today Pt will go Sit to Stand: with supervision PT Goal: Sit to Stand - Progress: Goal set today Pt will Transfer Bed to Chair/Chair to Bed: with supervision PT Transfer Goal: Bed to Chair/Chair to Bed - Progress: Goal set today Pt will Ambulate: 51 - 150 feet;with supervision;with least restrictive assistive device PT Goal: Ambulate - Progress: Goal set today  Visit Information  Last PT Received On: 02/08/12 Assistance Needed: +1 PT/OT Co-Evaluation/Treatment: Yes    Subjective Data  Subjective: I don't use my walker Patient Stated Goal: I at home   Prior Functioning  Home Living Lives With: Family;Son Available Help at Discharge: Family Type of Home: House Home Access: Level entry Home Layout: One level Bathroom Shower/Tub:  (sponge bath) Bathroom Toilet: Handicapped height Home Adaptive Equipment: Walker - rolling;Bedside commode/3-in-1;Straight cane Prior Function Level of Independence: Needs assistance (but could walk around the house) Driving: No Communication Communication: No difficulties Dominant Hand: Left    Cognition  Overall Cognitive Status: Appears within functional limits for tasks assessed/performed Arousal/Alertness: Awake/alert Orientation Level: Oriented X4 / Intact;Appears intact for tasks assessed;Other (comment) (with guiding questions) Behavior  During Session: [facility] for tasks performed    Extremity/Trunk Assessment Right Lower Extremity Assessment RLE ROM/Strength/Tone: [facility] for tasks assessed Left Lower Extremity Assessment LLE ROM/Strength/Tone: Deficits LLE ROM/Strength/Tone Deficits: quads 3/5,  hams 3-/5, hip flexors 2+/5 df/pf 3+/5 LLE Sensation: WFL - Light Touch LLE Coordination: Deficits LLE Coordination Deficits:  moves synergistically Trunk Assessment Trunk Assessment: Normal   Balance Balance Balance Assessed: Yes Static Sitting Balance Static Sitting - Balance Support: Feet supported;Right upper extremity supported;Left upper extremity supported Static Sitting - Level of Assistance: 5: Stand by assistance  End of Session PT - End of Session Activity Tolerance: Patient tolerated treatment well Patient left: in chair;with call bell/phone within reach Nurse Communication: Mobility status  GP     Ethan Kasperski, Eliseo Gum 02/08/2012, 3:02 PM  02/08/2012  [place] Bing, PT [phone] (pager)

## 2012-02-08 NOTE — Progress Notes (Signed)
Stroke Team Progress Note  HISTORY [name] is an 71 y.o. female who went to [facility] hospital last Friday 02/03/2012 to see her brother. While she was in the hospital she fell in the bathroom. She states she was generally weak. She noted Saturday 02/04/2012 her left leg and arm were weaker than usual. This continued to today 02/07/2012. When she went to her Oncologist she mentioned her weakness and he obtained a CT head. CT head showed a 2.2 x 1.2 cm hemorrhage centered at the right globus pallidus/genu of the right internal capsule with surrounding vasogenic edema. Patient was transferred to [facility] for admission. Patient was not a TPA candidate secondary to hemorrhage. She was admitted to the neuro ICU for further evaluation and treatment.  SUBJECTIVE No family is at the bedside.  Overall she feels her condition is gradually improving.   OBJECTIVE Most recent Vital Signs: Filed Vitals:   02/08/12 0425 02/08/12 0500 02/08/12 0538 02/08/12 0600  BP: 157/113 178/79 125/67 180/55  Pulse: 53 52 51 53  Temp:      TempSrc:      Resp: 28 27 16 19   Height:      Weight:      SpO2: 98% 97% 98% 97%   CBG (last 3)   Basename 02/07/12 1700  GLUCAP 98    IV Fluid Intake:     . sodium chloride 10 mL/hr (02/07/12 1620)    MEDICATIONS    . dexamethasone  20 mg Oral Weekly  . furosemide  20 mg Oral Daily  . hydrALAZINE      . irbesartan  150 mg Oral Daily  . oxybutynin  5 mg Oral Daily  . pantoprazole  40 mg Oral Daily  . senna-docusate  1 tablet Oral BID  . DISCONTD: dexamethasone  20 mg Oral Weekly  . DISCONTD: nebivolol  2.5-10 mg Oral BID  . DISCONTD: pantoprazole (PROTONIX) IV  40 mg Intravenous QHS   PRN:  acetaminophen, acetaminophen, acetaminophen, bisacodyl, diphenhydrAMINE, hydrALAZINE, LORazepam, ondansetron (ZOFRAN) IV, oxyCODONE-acetaminophen, DISCONTD: diphenhydramine-acetaminophen, DISCONTD: hydrALAZINE, DISCONTD: labetalol  Diet:  Carb Control thin  liquids Activity:  Bedrest DVT Prophylaxis:  SCDs   CLINICALLY SIGNIFICANT STUDIES Basic Metabolic Panel:  Lab 02/07/12 1027 02/07/12 0835  NA 141 142  K 3.6 3.0*  CL 105 106  CO2 26 27  GLUCOSE 95 155*  BUN 23 26*  CREATININE 1.05 1.16*  CALCIUM 9.6 9.3  MG -- --  PHOS -- --   Liver Function Tests:  Lab 02/07/12 1500 02/07/12 0835  AST 8 10  ALT 10 9  ALKPHOS 47 43  BILITOT 0.5 0.4  PROT 7.0 6.7  ALBUMIN 3.6 3.4*   CBC:  Lab 02/07/12 1500 02/07/12 0835  WBC 6.0 5.45.5  NEUTROABS -- 2.82.4  HGB 11.6* 10.7*10.3*  HCT 38.1 34.6*34.2*  MCV 96.9 98.398.8  PLT 213 224212   Coagulation:  Lab 02/07/12 0835  LABPROT 13.1  INR 1.00   Cardiac Enzymes: No results found for this basename: CKTOTAL:3,CKMB:3,CKMBINDEX:3,TROPONINI:3 in the last 168 hours Urinalysis: No results found for this basename: COLORURINE:2,APPERANCEUR:2,LABSPEC:2,PHURINE:2,GLUCOSEU:2,HGBUR:2,BILIRUBINUR:2,KETONESUR:2,PROTEINUR:2,UROBILINOGEN:2,NITRITE:2,LEUKOCYTESUR:2 in the last 168 hours Lipid Panel    Component Value Date/Time   CHOL 142 02/08/2012 0430   TRIG 93 02/08/2012 0430   HDL 49 02/08/2012 0430   CHOLHDL 2.9 02/08/2012 0430   VLDL 19 02/08/2012 0430   LDLCALC 74 02/08/2012 0430   HgbA1C  Lab Results  Component Value Date   HGBA1C 6.1* 02/07/2012    Urine Drug  Screen:   No results found for this basename: labopia, cocainscrnur, labbenz, amphetmu, thcu, labbarb    Alcohol Level: No results found for this basename: ETH:2 in the last 168 hours  CT of the brain  02/07/2012   2.2 x 1.2 cm hemorrhage centered at the right globus pallidus/genu of the right internal capsule with surrounding vasogenic edema. Mild local mass effect with minimal impression upon the right ventricle. This may be related to a hypertensive hemorrhage or coagulopathy from chemotherapy.    MRI of the brain    MRA of the brain    2D Echocardiogram    Carotid Doppler    CXR    EKG  sinus bradycardia.     Therapy Recommendations PT - ; OT - ; ST -   Physical Exam pleasant middle-aged [place] American lady currently not in distress.Awake alert. Afebrile. Head is nontraumatic. Neck is supple without bruit. Hearing is normal. Cardiac exam no murmur or gallop. Lungs are clear to auscultation. Distal pulses are well felt. Neurological Exam :  Awake alert oriented x 3 normal speech and language.extraocular moments are full range without nystagmus. Fundi were not visualized. Vision acuity and fields appear normal. Mild left lower face asymmetry. Tongue midline. No drift. Mild diminished fine finger movements on left. Orbits right over left upper extremity. Moderate left grip weak.. Normal sensation . Normal coordination.  ASSESSMENT [name] is a 71 y.o. female presenting with left sided weakness. 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 Hemorrhage most likely secondary to Hypertension. BP 164/81 on arrival 4 days after onset of symptoms. Work up underway. On aspirin 81 mg orally every day prior to admission. Patient with resultant left hemiparesis and left hemisensory deficit.   Hypertension  CAD-PTCA 2005  Pre Diabetes  GERD  Hyperlipidemia, on statin PTA  Multiple myeloma hx. No signs of mets on CT  Hospital day # 1  TREATMENT/PLAN  Add norvasc 5 mg daily  Discontinue foley  OOB. Therapy evals  Transfer to the floor  Annie Main, MSN, RN, ANVP-BC, ANP-BC, GNP-BC [facility] Pager: [phone] 02/08/2012 8:09 AM  Scribe for Dr. Delia Heady, Stroke Center Medical Director, who has personally reviewed chart, pertinent data, examined the patient and developed the plan of care. Pager:  [phone]

## 2012-02-08 NOTE — Evaluation (Signed)
Speech Language Pathology Evaluation Patient Details Name: [name] MRN: [id number] DOB: 1940/10/12 Today's Date: 02/08/2012 Time: 4782-9562 SLP Time Calculation (min): 15 min  Problem List:  Patient Active Problem List  Diagnosis  . MULTIPLE  MYELOMA  . HYPERLIPIDEMIA  . Essential hypertension, benign  . Coronary atherosclerosis of native coronary artery  . Edema leg  . HX, PERSONAL, MALIGNANCY, COLON  . Vertigo  . Palpitations  . GERD (gastroesophageal reflux disease)  . Epigastric pain  . Left sided abdominal pain  . Constipation  . Anemia  . Hemiplegia, unspecified, affecting nondominant side  . Nontraumatic acute hemorrhage of basal ganglia   Past Medical History:  Past Medical History  Diagnosis Date  . Essential hypertension, benign   . Coronary atherosclerosis of native coronary artery     Former patient of [facility] - PTCA apical LAD 2005  . Prediabetes     Borderline  . GERD (gastroesophageal reflux disease)   . Hypercholesteremia   . Female stress incontinence   . UTI (urinary tract infection)   . Vertigo   . Poor sleep pattern   . Colon cancer   . Multiple myeloma(203.0)   . Osteoarthritis   . Arrhythmia   . Constipation   . Gastroparesis 2009    mild delay of emptying with 30% retained at 2 hours   Past Surgical History:  Past Surgical History  Procedure Date  . Colon surgery 1995    Resection, [facility], colon cancer  . Abdominal hysterectomy 1970s    [facility]  . Knee arthroscopy 2003    LEFT, [facility]  . Heel spur surgery     KEELING, [facility]  . Portacath placement     [facility]   . Cervical spine surgery 2004    [facility]  . Port-a-cath removal 11/05/2010    Procedure: REMOVAL PORT-A-CATH;  Surgeon: Dalia Heading;  Location: [facility];  Service: General;  Laterality: Left;  started at 0846  . Portacath placement 11/05/2010    Procedure: INSERTION PORT-A-CATH;  Surgeon: Dalia Heading;  Location: [facility];  Service: General;  Laterality: Right;  Right subclavian, device  left accessed   . Colonoscopy  07/02/2007    Normal rectum status post prior sigmoid resection with normal anastomosis identified at 35 cm.  Normal residual colon.Marland Kitchen Next TCS 06/2012  . Esophagogastroduodenoscopy   08/22/2007    Small hiatal hernia/A circular, purplish, 1-cm, distal esophageal submucosal lesion of uncertain significance, otherwise normal esophagus. EUS done as below.  Marland Kitchen Appendectomy 1953  . Eus 08/2007    esophageal bleb (aberrant blood vessel). retained gastric contents/food   HPI:  [name] is an 71 y.o. female who went to [facility] hospital last Friday 02/03/2012 to see her brother. While she was in the hospital she fell in the bathroom. She states she was generally weak. She noted Saturday 02/04/2012 her left leg and arm were weaker than usual. This continued to today 02/07/2012. When she went to her Oncologist she mentioned her weakness and he obtained a CT head. CT head showed a 2.2 x 1.2 cm hemorrhage centered at the right globus pallidus/genu of the right internal capsule with surrounding vasogenic edema. Patient was transferred to [facility] for admission. Patient was not a TPA candidate secondary to hemorrhage. She was admitted to the neuro ICU for further evaluation and treatment   Assessment / Plan / Recommendation Clinical Impression  Pt's language, speech, and cognitve function appear to be [facility].  No SLP needs identified.  Will sign-off.           Follow Up Recommendations  None    Pertinent Vitals/Pain No pain       SLP Evaluation Prior Functioning  Cognitive/Linguistic Baseline: Within functional limits Type of Home: House Lives With: Family;Son Available Help at Discharge: Family   Cognition  Overall Cognitive Status: Appears within functional limits for tasks assessed Arousal/Alertness: Awake/alert Orientation Level: Oriented X4 Attention: Selective Selective Attention: Appears intact Memory: Appears intact    Comprehension   Auditory Comprehension Overall Auditory Comprehension: Appears within functional limits for tasks assessed Commands: Impaired Multistep Basic Commands: 75-100% accurate Conversation: Simple Visual Recognition/Discrimination Discrimination: Within Function Limits Reading Comprehension Reading Status: Within funtional limits    Expression Expression Primary Mode of Expression: Verbal Verbal Expression Overall Verbal Expression: Appears within functional limits for tasks assessed Written Expression Dominant Hand: Left Written Expression: Not tested   Oral / Motor Oral Motor/Sensory Function Overall Oral Motor/Sensory Function: Appears within functional limits for tasks assessed Motor Speech Overall Motor Speech: Appears within functional limits for tasks assessed   [name], [place] CCC/SLP Pager [phone]      [name] 02/08/2012, 11:10 AM

## 2012-02-09 LAB — KAPPA/LAMBDA LIGHT CHAINS
Kappa free light chain: 0.98 mg/dL (ref 0.33–1.94)
Kappa, lambda light chain ratio: 0.24 — ABNORMAL LOW (ref 0.26–1.65)
Lambda free light chains: 4.06 mg/dL — ABNORMAL HIGH (ref 0.57–2.63)

## 2012-02-09 NOTE — Progress Notes (Signed)
PT Cancellation Note  Patient Details Name: [name] MRN: [id number] DOB: 1940-07-16   Cancelled Treatment:    Reason Eval/Treat Not Completed: Other (comment) (pt refuse "Too Late")   Takeyla Million, Eliseo Gum 02/09/2012, 5:34 PM

## 2012-02-09 NOTE — Progress Notes (Signed)
Stroke Team Progress Note  HISTORY [name] is an 71 y.o. female who went to [facility] hospital last Friday 02/03/2012 to see her brother. While she was in the hospital she fell in the bathroom. She states she was generally weak. She noted Saturday 02/04/2012 her left leg and arm were weaker than usual. This continued to today 02/07/2012. When she went to her Oncologist she mentioned her weakness and he obtained a CT head. CT head showed a 2.2 x 1.2 cm hemorrhage centered at the right globus pallidus/genu of the right internal capsule with surrounding vasogenic edema. Patient was transferred to [facility] for admission. Patient was not a TPA candidate secondary to hemorrhage. She was admitted to the neuro ICU for further evaluation and treatment.  SUBJECTIVE "I have peed and peed all night. I hate to bother the nurses."  OBJECTIVE Most recent Vital Signs: Filed Vitals:   02/09/12 0400 02/09/12 0500 02/09/12 0508 02/09/12 0600  BP:   166/66 169/72  Pulse:      Temp: 97.8 F (36.6 C)     TempSrc:      Resp: 14 15 16    Height:      Weight:      SpO2:    97%   CBG (last 3)   Basename 02/08/12 0833 02/07/12 1700  GLUCAP 108* 98   IV Fluid Intake:     . DISCONTD: sodium chloride 10 mL/hr (02/07/12 1620)   MEDICATIONS    . amLODipine  5 mg Oral Daily  . atorvastatin  40 mg Oral q1800  . dexamethasone  20 mg Oral Weekly  . enoxaparin (LOVENOX) injection  40 mg Subcutaneous Q24H  . furosemide  20 mg Oral Daily  . irbesartan  150 mg Oral Daily  . oxybutynin  5 mg Oral Daily  . pantoprazole  40 mg Oral Daily  . senna-docusate  1 tablet Oral BID   PRN:  acetaminophen, acetaminophen, acetaminophen, bisacodyl, diphenhydrAMINE, hydrALAZINE, LORazepam, ondansetron (ZOFRAN) IV, oxyCODONE-acetaminophen  Diet:  Carb Control thin liquids Activity:  Bedrest DVT Prophylaxis:  SCDs   CLINICALLY SIGNIFICANT STUDIES Basic Metabolic Panel:   Lab 02/07/12 1500 02/07/12 0835  NA  141 142  K 3.6 3.0*  CL 105 106  CO2 26 27  GLUCOSE 95 155*  BUN 23 26*  CREATININE 1.05 1.16*  CALCIUM 9.6 9.3  MG -- --  PHOS -- --   Liver Function Tests:   Lab 02/07/12 1500 02/07/12 0835  AST 8 10  ALT 10 9  ALKPHOS 47 43  BILITOT 0.5 0.4  PROT 7.0 6.76.3  ALBUMIN 3.6 3.4*   CBC:   Lab 02/07/12 1500 02/07/12 0835  WBC 6.0 5.45.5  NEUTROABS -- 2.82.4  HGB 11.6* 10.7*10.3*  HCT 38.1 34.6*34.2*  MCV 96.9 98.398.8  PLT 213 224212   Coagulation:   Lab 02/07/12 0835  LABPROT 13.1  INR 1.00   Lipid Panel    Component Value Date/Time   CHOL 142 02/08/2012 0430   TRIG 93 02/08/2012 0430   HDL 49 02/08/2012 0430   CHOLHDL 2.9 02/08/2012 0430   VLDL 19 02/08/2012 0430   LDLCALC 74 02/08/2012 0430   HgbA1C  Lab Results  Component Value Date   HGBA1C 6.1* 02/07/2012   CT of the brain  02/07/2012   2.2 x 1.2 cm hemorrhage centered at the right globus pallidus/genu of the right internal capsule with surrounding vasogenic edema. Mild local mass effect with minimal impression upon the right ventricle. This  may be related to a hypertensive hemorrhage or coagulopathy from chemotherapy.    MRI of the brain  02/08/2012  Exam is slightly motion degraded  Source images do not reveal a vessel extending to the right thalamic/basal ganglia hemorrhage.  Superior aspect of the right middle cerebral artery bifurcation with 2 x 1.2 mm bulge which may represent origin of the vessel or small aneurysm.  This is not appreciated on the prior exam and may be at the limitation of resolution of the present technique.  Moderate to marked narrowing A1 segment of the right anterior cerebral artery.  Middle cerebral artery branch vessel irregularity bilaterally.  Ectatic vertebral arteries.  Loss of signal distal aspect of the vertebral arteries and proximal basilar artery may be related to artifact although stenosis not excluded.  Nonvisualization right PICA and both AICAs.  Bulbous  appearance of the basilar tip from which vessels arise. This may be related to atherosclerotic type changes although aneurysm not excluded and unchanged from prior exam.  Mild to moderate narrowing proximal right posterior cerebral artery.  Mild narrowing distal posterior cerebral artery branch vessels bilaterally.       MRA of the brain  02/08/2012  1.6 x 1 x 1.2 cm hematoma anterior right thalamus/anterior aspect of the posterior limb of the right internal capsule.  Tiny areas of hemorrhage lateral to the main hemorrhage within the superior aspect of the right lenticular nucleus.    2D Echocardiogram  EF 50-55% with no source of embolus.   Carotid Doppler  Bilateral: 40-59% ICA stenosis. Vertebral artery flow is antegrade.  EKG  sinus bradycardia.   Therapy Recommendations PT - home health; OT - home health; ST - none  Physical Exam pleasant middle-aged [place] American lady currently not in distress.Awake alert. Afebrile. Head is nontraumatic. Neck is supple without bruit. Hearing is normal. Cardiac exam no murmur or gallop. Lungs are clear to auscultation. Distal pulses are well felt. Neurological Exam :  Awake alert oriented x 3 normal speech and language.extraocular moments are full range without nystagmus. Fundi were not visualized. Vision acuity and fields appear normal. Mild left lower face asymmetry. Tongue midline. No drift. Mild diminished fine finger movements on left. Orbits right over left upper extremity. Moderate left grip weak.. Normal sensation . Normal coordination.   ASSESSMENT Ms. [name] is a 71 y.o. female presenting with left sided weakness. 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 Hemorrhage most likely secondary to Hypertension. BP 164/81 on arrival 4 days after onset of symptoms. Work up completed. On aspirin 81 mg orally every day prior to admission. Patient with resultant left hemiparesis and left hemisensory deficit. Outpatient therapy  recommended.   Hypertension, norvasc 5 mg daily added 10/17. Remains elevated  CAD-PTCA 2005  Pre Diabetes  GERD  Hyperlipidemia, on statin PTA  Multiple myeloma hx. No signs of mets on CT  Hospital day # 2  TREATMENT/PLAN  Transfer to the floor  Monitor BP  Home health PT and OT  Anticipate discharge home in am  [facility], MSN, RN, ANVP-BC, ANP-BC, [facility] Pager: [phone] 02/09/2012 8:19 AM  Scribe for Dr. Delia Heady, Stroke Center Medical Director, who has personally reviewed chart, pertinent data, examined the patient and developed the plan of care. Pager:  [phone]

## 2012-02-10 ENCOUNTER — Encounter (HOSPITAL_COMMUNITY): Payer: Medicare Other

## 2012-02-10 ENCOUNTER — Ambulatory Visit (HOSPITAL_COMMUNITY): Payer: Medicare Other

## 2012-02-10 DIAGNOSIS — IMO0001 Reserved for inherently not codable concepts without codable children: Secondary | ICD-10-CM | POA: Diagnosis present

## 2012-02-10 LAB — MULTIPLE MYELOMA PANEL, SERUM
Alpha-1-Globulin: 5.2 % — ABNORMAL HIGH (ref 2.9–4.9)
Alpha-2-Globulin: 12.4 % — ABNORMAL HIGH (ref 7.1–11.8)
Beta 2: 5 % (ref 3.2–6.5)
Beta Globulin: 5.4 % (ref 4.7–7.2)
Gamma Globulin: 15 % (ref 11.1–18.8)

## 2012-02-10 MED ORDER — AMLODIPINE BESYLATE 5 MG PO TABS: 5.0000 mg | ORAL_TABLET | Freq: Every day | ORAL | Status: AC

## 2012-02-10 NOTE — Progress Notes (Signed)
Pt discharged to home per MD order.   Pt and daughter received all discharge instructions and medication information including follow up appointments and prescriptions.  Pt prescription for norvasc sent to [facility] per pt request.  Pt alert and oriented at discharge with no complaints of pain. Pt escorted to private vehicle via wheelchair by NT. Efraim Kaufmann

## 2012-02-10 NOTE — Progress Notes (Signed)
Stroke Team Progress Note  HISTORY [name] is an 71 y.o. female who went to [facility] hospital last Friday 02/03/2012 to see her brother. While she was in the hospital she fell in the bathroom. She states she was generally weak. She noted Saturday 02/04/2012 her left leg and arm were weaker than usual. This continued to today 02/07/2012. When she went to her Oncologist she mentioned her weakness and he obtained a CT head. CT head showed a 2.2 x 1.2 cm hemorrhage centered at the right globus pallidus/genu of the right internal capsule with surrounding vasogenic edema. Patient was transferred to [facility] for admission. Patient was not a TPA candidate secondary to hemorrhage. She was admitted to the neuro ICU for further evaluation and treatment.  SUBJECTIVE Complains of pain in the groin, relieved by pain medicine yesterday.  OBJECTIVE Most recent Vital Signs: Filed Vitals:   02/09/12 2000 02/10/12 0000 02/10/12 0400 02/10/12 0800  BP: 155/74 178/73 154/68 148/68  Pulse: 59 54 55 70  Temp: 98.4 F (36.9 C) 98.7 F (37.1 C) 98.8 F (37.1 C) 99.1 F (37.3 C)  TempSrc:      Resp: 18 18 18 17   Height:      Weight:      SpO2: 97% 96% 91% 98%   CBG (last 3)   Basename 02/08/12 0833 02/07/12 1700  GLUCAP 108* 98   IV Fluid Intake:    MEDICATIONS     . amLODipine  5 mg Oral Daily  . atorvastatin  40 mg Oral q1800  . dexamethasone  20 mg Oral Weekly  . enoxaparin (LOVENOX) injection  40 mg Subcutaneous Q24H  . furosemide  20 mg Oral Daily  . irbesartan  150 mg Oral Daily  . oxybutynin  5 mg Oral Daily  . pantoprazole  40 mg Oral Daily  . senna-docusate  1 tablet Oral BID   PRN:  acetaminophen, acetaminophen, acetaminophen, bisacodyl, diphenhydrAMINE, hydrALAZINE, LORazepam, ondansetron (ZOFRAN) IV, oxyCODONE-acetaminophen  Diet:  Carb Control thin liquids Activity:  OOB DVT Prophylaxis:  SCDs   CLINICALLY SIGNIFICANT STUDIES Basic Metabolic Panel:   Lab  02/07/12 1500 02/07/12 0835  NA 141 142  K 3.6 3.0*  CL 105 106  CO2 26 27  GLUCOSE 95 155*  BUN 23 26*  CREATININE 1.05 1.16*  CALCIUM 9.6 9.3  MG -- --  PHOS -- --   Liver Function Tests:   Lab 02/07/12 1500 02/07/12 0835  AST 8 10  ALT 10 9  ALKPHOS 47 43  BILITOT 0.5 0.4  PROT 7.0 6.76.3  ALBUMIN 3.6 3.4*   CBC:   Lab 02/07/12 1500 02/07/12 0835  WBC 6.0 5.45.5  NEUTROABS -- 2.82.4  HGB 11.6* 10.7*10.3*  HCT 38.1 34.6*34.2*  MCV 96.9 98.398.8  PLT 213 224212   Coagulation:   Lab 02/07/12 0835  LABPROT 13.1  INR 1.00   Lipid Panel    Component Value Date/Time   CHOL 142 02/08/2012 0430   TRIG 93 02/08/2012 0430   HDL 49 02/08/2012 0430   CHOLHDL 2.9 02/08/2012 0430   VLDL 19 02/08/2012 0430   LDLCALC 74 02/08/2012 0430   HgbA1C  Lab Results  Component Value Date   HGBA1C 6.1* 02/07/2012   CT of the brain  02/07/2012   2.2 x 1.2 cm hemorrhage centered at the right globus pallidus/genu of the right internal capsule with surrounding vasogenic edema. Mild local mass effect with minimal impression upon the right ventricle. This may be related  to a hypertensive hemorrhage or coagulopathy from chemotherapy.    MRI of the brain  02/08/2012  Exam is slightly motion degraded  Source images do not reveal a vessel extending to the right thalamic/basal ganglia hemorrhage.  Superior aspect of the right middle cerebral artery bifurcation with 2 x 1.2 mm bulge which may represent origin of the vessel or small aneurysm.  This is not appreciated on the prior exam and may be at the limitation of resolution of the present technique.  Moderate to marked narrowing A1 segment of the right anterior cerebral artery.  Middle cerebral artery branch vessel irregularity bilaterally.  Ectatic vertebral arteries.  Loss of signal distal aspect of the vertebral arteries and proximal basilar artery may be related to artifact although stenosis not excluded.  Nonvisualization right PICA  and both AICAs.  Bulbous appearance of the basilar tip from which vessels arise. This may be related to atherosclerotic type changes although aneurysm not excluded and unchanged from prior exam.  Mild to moderate narrowing proximal right posterior cerebral artery.  Mild narrowing distal posterior cerebral artery branch vessels bilaterally.       MRA of the brain  02/08/2012  1.6 x 1 x 1.2 cm hematoma anterior right thalamus/anterior aspect of the posterior limb of the right internal capsule.  Tiny areas of hemorrhage lateral to the main hemorrhage within the superior aspect of the right lenticular nucleus.    2D Echocardiogram  EF 50-55% with no source of embolus.   Carotid Doppler  Bilateral: 40-59% ICA stenosis. Vertebral artery flow is antegrade.  EKG  sinus bradycardia.   Therapy Recommendations PT - home health; OT - home health; ST - none  Physical Exam pleasant middle-aged [place] lady currently not in distress.Awake alert. Afebrile. Head is nontraumatic. Neck is supple without bruit. Hearing is normal. Cardiac exam no murmur or gallop. Lungs are clear to auscultation. Distal pulses are well felt. Neurological Exam :  Awake alert oriented x 3 normal speech and language.extraocular moments are full range without nystagmus. Fundi were not visualized. Vision acuity and fields appear normal. Mild left lower face asymmetry. Tongue midline. No drift. Mild diminished fine finger movements on left. Orbits right over left upper extremity. Moderate left grip weak.. Normal sensation . Normal coordination.   ASSESSMENT [name] is a 71 y.o. female presenting with left sided weakness. 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 Hemorrhage most likely secondary to Hypertension. BP 164/81 on arrival 4 days after onset of symptoms. Work up completed. On aspirin 81 mg orally every day prior to admission. Patient with resultant left hemiparesis and left hemisensory deficit. Outpatient  therapy recommended.   Hypertension, norvasc 5 mg daily added 10/17. Remains elevated  CAD-PTCA 2005  Pre Diabetes  GERD  Hyperlipidemia, on statin PTA  Multiple myeloma hx. No signs of mets on CT  Hospital day # 3  TREATMENT/PLAN  Discharge home  Home health PT and OT  Annie Main, MSN, RN, ANVP-BC, ANP-BC, [facility] Pager: [phone] 02/10/2012 9:36 AM  Scribe for Dr. Delia Heady, Stroke Center Medical Director, who has personally reviewed chart, pertinent data, examined the patient and developed the plan of care. Pager:  [phone]

## 2012-02-10 NOTE — Progress Notes (Signed)
PT Cancellation Note  Patient Details Name: [name] MRN: [id number] DOB: 04/06/1941   Cancelled Treatment:    Reason Eval/Treat Not Completed:  (pt refuse PT.  )   Avigail Pilling F 02/10/2012, 1:19 PM

## 2012-02-10 NOTE — Progress Notes (Signed)
[facility]  Patient Status: New  [facility] is providing the following services: PT and OT  If patient discharges after hours, please call [phone].   Kizzie Furnish 02/10/2012, 2:27 PM

## 2012-02-10 NOTE — Discharge Summary (Signed)
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  Past Medical History  Diagnosis Date  . Essential hypertension, benign   . Coronary atherosclerosis of native coronary artery     Former patient of [facility] - [facility] apical LAD 2005  . Prediabetes     Borderline  . GERD (gastroesophageal reflux disease)   . Hypercholesteremia   . Female stress incontinence   . UTI (urinary tract infection)   . Vertigo   . Poor sleep pattern   . Colon cancer   . Multiple myeloma(203.0)   . Osteoarthritis   . Arrhythmia   . Constipation   . Gastroparesis 2009    mild delay of emptying with 30% retained at 2 hours   Past Surgical History  Procedure Date  . Colon surgery 1995    Resection, [facility], colon cancer  . Abdominal hysterectomy 1970s    [facility]  . Knee arthroscopy 2003    LEFT, [facility]  . Heel spur surgery     KEELING, [facility]  . Portacath placement     [facility]   . Cervical spine surgery 2004    [facility]  . Port-a-cath removal 11/05/2010    Procedure: REMOVAL PORT-A-CATH;  Surgeon: Dalia Heading;  Location: [facility];  Service: General;  Laterality: Left;  started at 0846  . Portacath placement 11/05/2010    Procedure: INSERTION PORT-A-CATH;  Surgeon: Dalia Heading;  Location: [facility];  Service: General;  Laterality: Right;  Right subclavian, device left accessed   . Colonoscopy  07/02/2007    Normal rectum  status post prior sigmoid resection with normal anastomosis identified at 35 cm.  Normal residual colon.Marland Kitchen Next TCS 06/2012  . Esophagogastroduodenoscopy   08/22/2007    Small hiatal hernia/A circular, purplish, 1-cm, distal esophageal submucosal lesion of uncertain significance, otherwise normal esophagus. EUS done as below.  Marland Kitchen Appendectomy 1953  . Eus 08/2007    esophageal bleb (aberrant blood vessel). retained gastric contents/food      Medication List     As of 02/10/2012 12:38 PM    STOP taking these medications         aspirin 81 MG EC tablet      TAKE these medications         amLODipine 5 MG tablet   Commonly known as: NORVASC   Take 1 tablet (5 mg total) by mouth daily.      cholecalciferol 1000 UNITS tablet   Commonly known as: VITAMIN D   Take 1,000 Units by mouth daily.      dexamethasone 4 MG tablet   Commonly known as: DECADRON   Take 20 mg by mouth once a week. Wednesday      DIOVAN 160 MG tablet   Generic drug: valsartan   Take 1 tablet by mouth 2 (two) times daily.      diphenhydramine-acetaminophen 25-500 MG Tabs   Commonly known as: TYLENOL PM  Take 1 tablet by mouth at bedtime as needed. Pain/Sleep      DULCOLAX PO   Take 2 tablets by mouth daily as needed. Constipation      furosemide 20 MG tablet   Commonly known as: LASIX   Take 20 mg by mouth as needed. Fluid Retention      LORazepam 0.5 MG tablet   Commonly known as: ATIVAN   Take 1 tablet (0.5 mg total) by mouth every 6 (six) hours as needed (nausea and vomiting).      omeprazole 20 MG capsule   Commonly known as: PRILOSEC   Take 20 mg by mouth 2 (two) times daily.      oxybutynin 5 MG tablet   Commonly known as: DITROPAN   Take 1 tablet (5 mg total) by mouth daily.      oxyCODONE-acetaminophen 5-325 MG per tablet   Commonly known as: PERCOCET/ROXICET   Take 1 tablet by mouth every 6 (six) hours as needed. pain      pomalidomide 4 MG capsule   Commonly known as: POMALYST   Take  1 tablet, PO daily x 21 days with 7 day respite (starting on 02/13/2012)      rosuvastatin 20 MG tablet   Commonly known as: CRESTOR   Take 20 mg by mouth at bedtime.      ASK your doctor about these medications         nebivolol 5 MG tablet   Commonly known as: BYSTOLIC   Take 2.5-10 mg by mouth 2 (two) times daily. Take 10 mg in the am and take 2.5 mg in the pm by mouth daily.        LABORATORY STUDIES CBC    Component Value Date/Time   WBC 6.0 02/07/2012 1500   RBC 3.93 02/07/2012 1500   HGB 11.6* 02/07/2012 1500   HCT 38.1 02/07/2012 1500   PLT 213 02/07/2012 1500   MCV 96.9 02/07/2012 1500   MCH 29.5 02/07/2012 1500   MCHC 30.4 02/07/2012 1500   RDW 17.3* 02/07/2012 1500   LYMPHSABS 1.2 02/07/2012 0835   LYMPHSABS 1.3 02/07/2012 0835   MONOABS 0.9 02/07/2012 0835   MONOABS 1.1* 02/07/2012 0835   EOSABS 0.6 02/07/2012 0835   EOSABS 0.7 02/07/2012 0835   BASOSABS 0.0 02/07/2012 0835   BASOSABS 0.0 02/07/2012 0835   CMP    Component Value Date/Time   NA 141 02/07/2012 1500   K 3.6 02/07/2012 1500   CL 105 02/07/2012 1500   CO2 26 02/07/2012 1500   GLUCOSE 95 02/07/2012 1500   BUN 23 02/07/2012 1500   CREATININE 1.05 02/07/2012 1500   CREATININE 104.89* 03/04/2010 0936   CALCIUM 9.6 02/07/2012 1500   CALCIUM 10.4 08/19/2011 1105   PROT 7.0 02/07/2012 1500   ALBUMIN 3.6 02/07/2012 1500   AST 8 02/07/2012 1500   ALT 10 02/07/2012 1500   ALKPHOS 47 02/07/2012 1500   BILITOT 0.5 02/07/2012 1500   GFRNONAA 53* 02/07/2012 1500   GFRAA 61* 02/07/2012 1500   COAGS Lab Results  Component Value Date   INR 1.00 02/07/2012   INR 1.04 08/18/2011   INR 0.94 11/30/2010   Lipid Panel    Component Value Date/Time   CHOL 142 02/08/2012 0430   TRIG 93 02/08/2012 0430   HDL 49 02/08/2012 0430   CHOLHDL 2.9 02/08/2012 0430   VLDL 19 02/08/2012 0430   LDLCALC 74 02/08/2012 0430   HgbA1C  Lab Results  Component Value Date  HGBA1C 6.1* 02/07/2012   Urinalysis     Component Value Date/Time   COLORURINE YELLOW 08/18/2011 1746   APPEARANCEUR CLOUDY* 08/18/2011 1746   LABSPEC 1.025 08/18/2011 1746   PHURINE 5.0 08/18/2011 1746   GLUCOSEU NEGATIVE 08/18/2011 1746   HGBUR MODERATE* 08/18/2011 1746   HGBUR negative 02/26/2007 0849   BILIRUBINUR MODERATE* 08/18/2011 1746   KETONESUR TRACE* 08/18/2011 1746   PROTEINUR 30* 08/18/2011 1746   UROBILINOGEN 0.2 08/18/2011 1746   NITRITE NEGATIVE 08/18/2011 1746   LEUKOCYTESUR NEGATIVE 08/18/2011 1746   Urine Drug Screen  No results found for this basename: labopia, cocainscrnur, labbenz, amphetmu, thcu, labbarb    Alcohol Level No results found for this basename: eth    SIGNIFICANT DIAGNOSTIC STUDIES CT of the brain 02/07/2012 2.2 x 1.2 cm hemorrhage centered at the right globus pallidus/genu of the right internal capsule with surrounding vasogenic edema. Mild local mass effect with minimal impression upon the right ventricle. This may be related to a hypertensive hemorrhage or coagulopathy from chemotherapy.  MRI of the brain 02/08/2012 Exam is slightly motion degraded Source images do not reveal a vessel extending to the right thalamic/basal ganglia hemorrhage. Superior aspect of the right middle cerebral artery bifurcation with 2 x 1.2 mm bulge which may represent origin of the vessel or small aneurysm. This is not appreciated on the prior exam and may be at the limitation of resolution of the present technique. Moderate to marked narrowing A1 segment of the right anterior cerebral artery. Middle cerebral artery branch vessel irregularity bilaterally. Ectatic vertebral arteries. Loss of signal distal aspect of the vertebral arteries and proximal basilar artery may be related to artifact although stenosis not excluded. Nonvisualization right PICA and both AICAs. Bulbous appearance of the basilar tip from which vessels arise. This may be related to atherosclerotic type changes although aneurysm not excluded and unchanged  from prior exam. Mild to moderate narrowing proximal right posterior cerebral artery. Mild narrowing distal posterior cerebral artery branch vessels bilaterally.  MRA of the brain 02/08/2012 1.6 x 1 x 1.2 cm hematoma anterior right thalamus/anterior aspect of the posterior limb of the right internal capsule. Tiny areas of hemorrhage lateral to the main hemorrhage within the superior aspect of the right lenticular nucleus.  2D Echocardiogram EF 50-55% with no source of embolus.  Carotid Doppler Bilateral: 40-59% ICA stenosis. Vertebral artery flow is antegrade.  EKG sinus bradycardia.   History of Present Illness  [name] is an 71 y.o. female who went to [facility] hospital last Friday 02/03/2012 to see her brother. While she was in the hospital she fell in the bathroom. She states she was generally weak. She noted Saturday 02/04/2012 her left leg and arm were weaker than usual. This continued to today 02/07/2012. When she went to her Oncologist she mentioned her weakness and he obtained a CT head. CT head showed a 2.2 x 1.2 cm hemorrhage centered at the right globus pallidus/genu of the right internal capsule with surrounding vasogenic edema. Patient was transferred to [facility] for admission. Patient was not a TPA candidate secondary to hemorrhage. She was admitted to the neuro ICU for further evaluation and treatment.  Hospital Course Imaging confirms a right basal ganglia hemorrhage. The patient's hemorrhage remained stable during the first 24h of admission, the time frame with highest risk of rebleeding. Hemorrhage was felt to be secondary to  hypertension, presenting with BP 164/81. MRI was negative for underlying vascular abnormality. Once able to take POs, norvasc was  added. BP is improving, but may need additional medications in the future. With continued stability, She was transferred to the floor.  Hperlipidemia, on statin PTA. Will continue at discharge.   Patient with  continued stroke symptoms of left hemiparesis and left hemisensory deficit Physical therapy, occupational therapy and speech therapy evaluated patient. They recommend home health PT and OT  Discharge Exam  Blood pressure 155/77, pulse 70, temperature 99.1 F (37.3 C), temperature source Oral, resp. rate 17, height 5\' 5"  (1.651 m), weight 102.1 kg (225 lb 1.4 oz), SpO2 98.00%. Pleasant middle-aged [place] American lady currently not in distress.Awake alert. Afebrile. Head is nontraumatic. Neck is supple without bruit. Hearing is normal. Cardiac exam no murmur or gallop. Lungs are clear to auscultation. Distal pulses are well felt.  Neurological Exam : Awake alert oriented x 3 normal speech and language.extraocular moments are full range without nystagmus. Fundi were not visualized. Vision acuity and fields appear normal. Mild left lower face asymmetry. Tongue midline. No drift. Mild diminished fine finger movements on left. Orbits right over left upper extremity. Moderate left grip weak.. Normal sensation . Normal coordination.    Discharge Diet   Carb Control thin liquids  Discharge Plan    Disposition:  Home  Home health PT and OT  Due to hemorrhage and risk of bleeding, do not take aspirin, aspirin-containing medications, or ibuprofen products  Ongoing risk factor control by Primary Care Physician.  Follow-up DONDIEGO,RICHARD M, MD in 1 month.  Follow-up with Dr. Delia Heady in 2 months.  Signed Annie Main, AVNP, ANP-BC, [facility] Nurse Practitioner 02/10/2012, 12:38 PM  Dr. Delia Heady, Stroke Center Medical Director, has personally reviewed chart, pertinent data, examined the patient and developed the plan of care.

## 2012-02-10 NOTE — Care Management Note (Signed)
    Page 1 of 1   02/10/2012     2:21:06 PM   CARE MANAGEMENT NOTE 02/10/2012  Patient:  [name], [name]   Account Number:  [id number]  Date Initiated:  02/08/2012  Documentation initiated by:  Carlyle Lipa  Subjective/Objective Assessment:   CVA-hemorrhagic     Action/Plan:   await evals from PT/OT to determine d/c needs   Anticipated DC Date:  02/12/2012   Anticipated DC Plan:  HOME/SELF CARE      DC Planning Services  CM consult      Choice offered to / List presented to:          [facility] arranged  HH-2 PT  HH-3 OT      [facility] agency  [facility]   Status of service:  Completed, signed off Medicare Important Message given?   (If response is "NO", the following Medicare IM given date fields will be blank) Date Medicare IM given:   Date Additional Medicare IM given:    Discharge Disposition:  HOME W HOME HEALTH SERVICES  Per UR Regulation:  Reviewed for med. necessity/level of care/duration of stay  If discussed at Long Length of Stay Meetings, dates discussed:    Comments:  02-10-12 [address], [place] [phone] Pt plan for d/c home today. Agreeable for hh services PT/OT. Pt is agreeale to [facility] to provide services. CM did make referral for services and SOC to begin within 24-48 hrs post d/c. CM did make liaison aware that she is interested in some bed rails to help her stand. Home PT to assess needs.  No further needs from CM at this time.

## 2012-02-15 ENCOUNTER — Encounter: Payer: Self-pay | Admitting: Oncology

## 2012-02-15 ENCOUNTER — Encounter (HOSPITAL_COMMUNITY): Payer: PRIVATE HEALTH INSURANCE

## 2012-02-23 ENCOUNTER — Other Ambulatory Visit (HOSPITAL_COMMUNITY): Payer: Self-pay | Admitting: Family Medicine

## 2012-02-23 DIAGNOSIS — Z139 Encounter for screening, unspecified: Secondary | ICD-10-CM

## 2012-02-26 IMAGING — CT CT ABD-PELV W/ CM
2 of 4 series · 17 of 46 positions shown, 19 images · IV contrast (Omnipaque 300)
Comparison: CT abdomen and pelvis 06/28/2007.

CLINICAL DATA: Abdominal pain.  History of prior appendectomy.

CT ABDOMEN AND PELVIS WITH CONTRAST
TECHNIQUE: Multidetector CT imaging of the abdomen and pelvis was
performed following the standard protocol during bolus
administration of intravenous contrast.
Contrast: 100 ml 3mnipaque-D11.

[Series 2: abd_pel_with 5.0 b40f · axial · 0.59mm/px · z∈[+494,+860]mm · 14 of 83 slices shown, 16 images]
[im 5/83  soft-tissue]
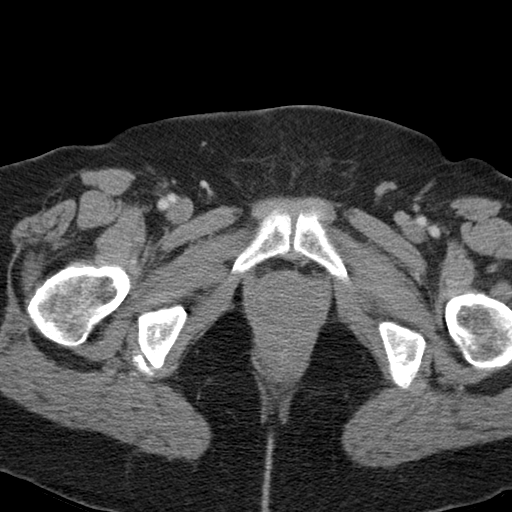
[im 5/83  bone]
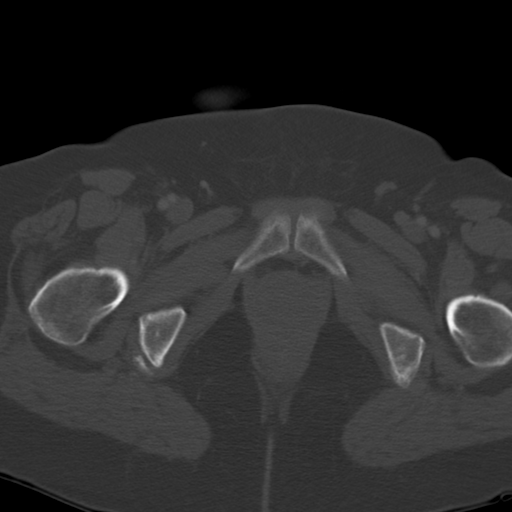
[im 9/83  soft-tissue]
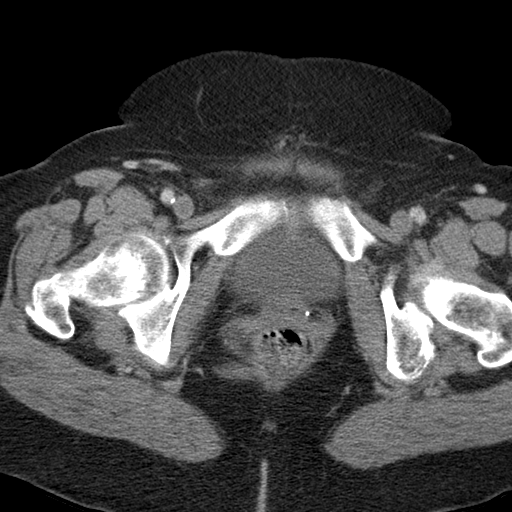
[im 18/83  soft-tissue]
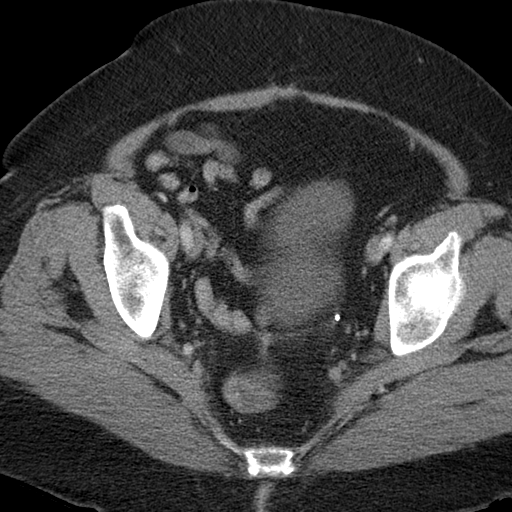
[im 22/83  soft-tissue]
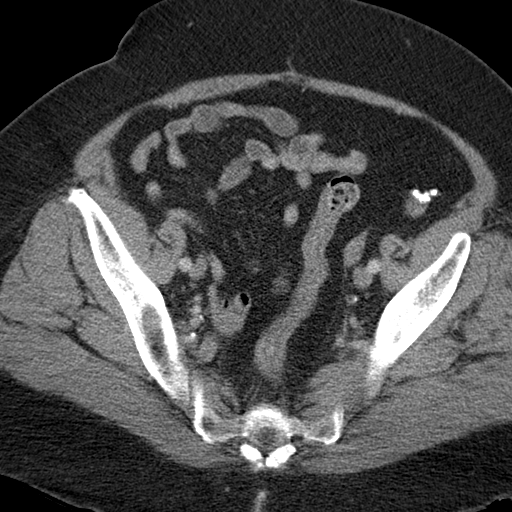
[im 26/83  soft-tissue]
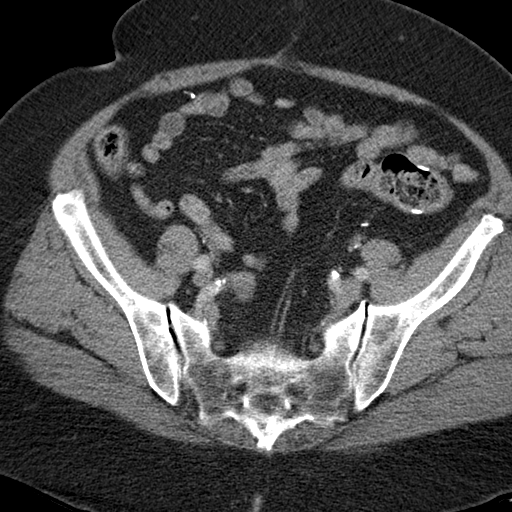
[im 35/83  soft-tissue]
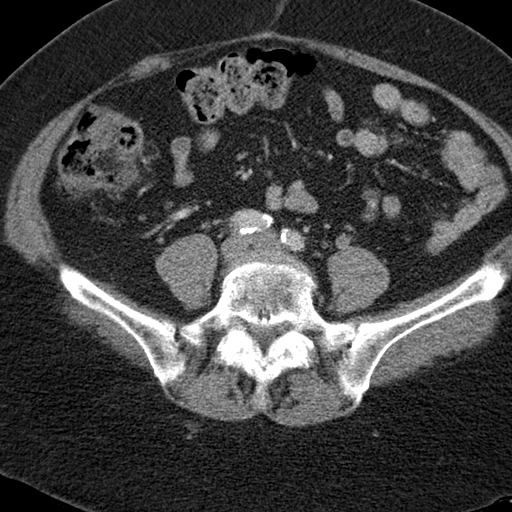
[im 39/83  soft-tissue]
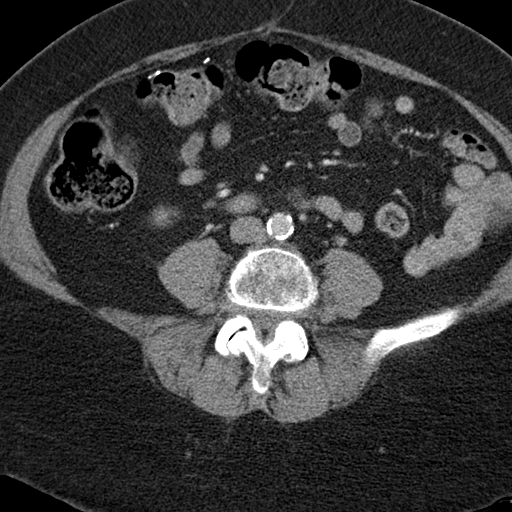
[im 44/83  soft-tissue]
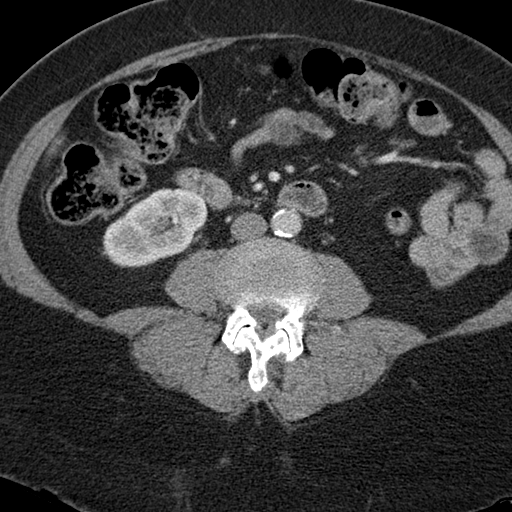
[im 48/83  soft-tissue]
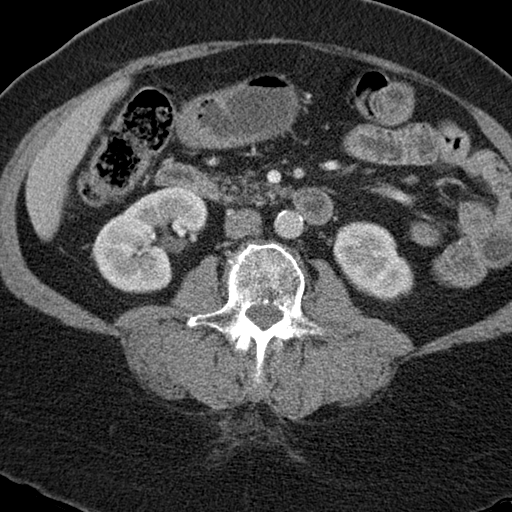
[im 48/83  bone]
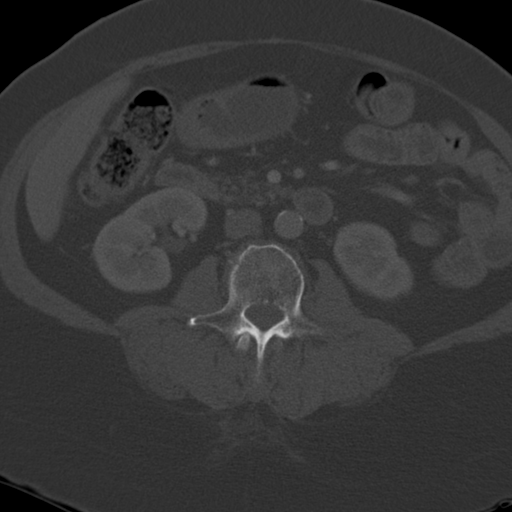
[im 57/83  soft-tissue]
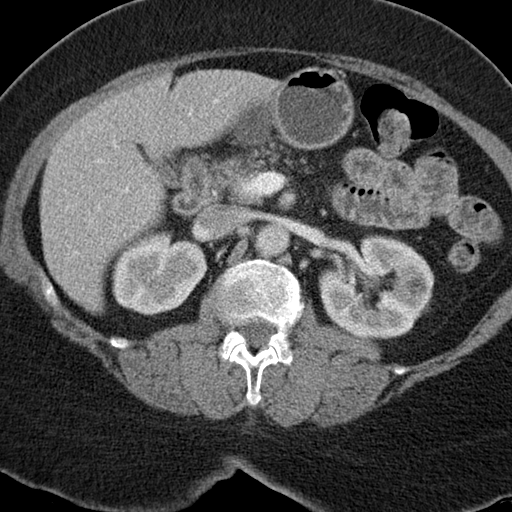
[im 61/83  soft-tissue]
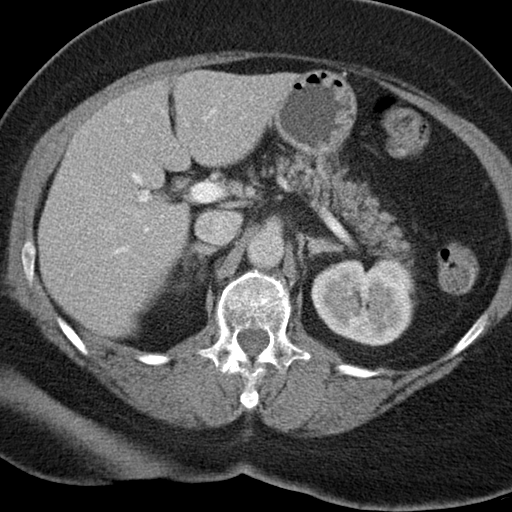
[im 65/83  soft-tissue]
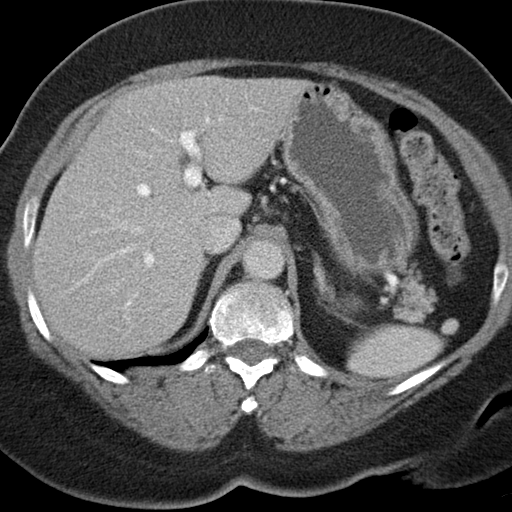
[im 74/83  soft-tissue]
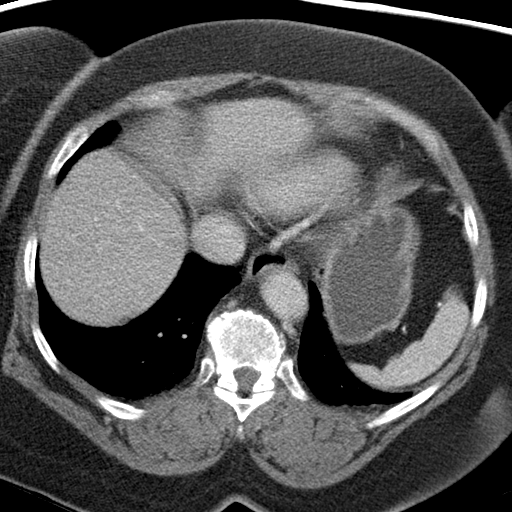
[im 78/83  soft-tissue]
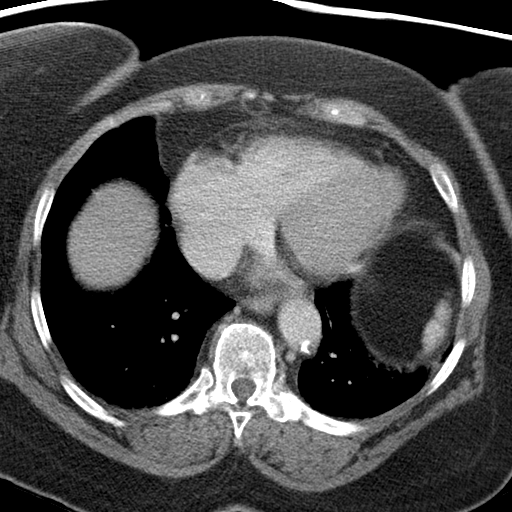

[Series 4: mpr cor post contrast (id) · coronal · 0.69mm/px · 3 of 108 slices shown]
[im 36/108  soft-tissue]
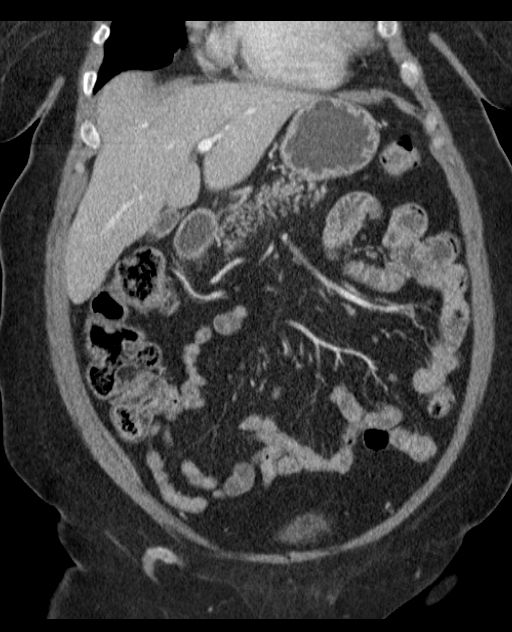
[im 48/108  soft-tissue]
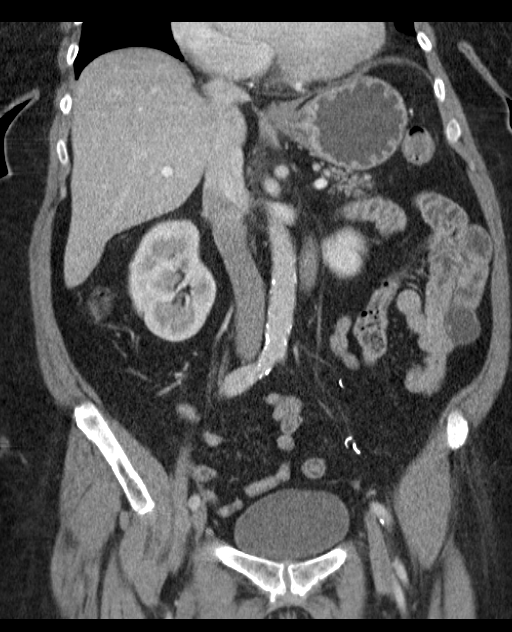
[im 60/108  soft-tissue]
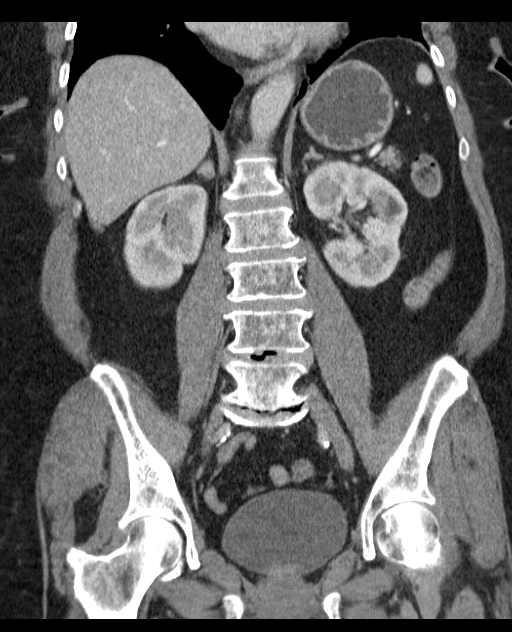

[17 of 46 positions shown; findings below may reference images not displayed]

FINDINGS: Lung bases are clear.  No pleural or pericardial
effusion.  Cardiomegaly noted.

The gallbladder, liver, biliary tree, spleen and right kidney are
all normal in appearance.  Small left renal cyst is unchanged.
Fatty replacement of the pancreas is noted.  Small right adrenal
lesion compatible with an adenoma is unchanged.  Left adrenal gland
appears normal.

No lymphadenopathy or fluid.  Surgical anastomosis in the left
lower quadrant at the junction of the descending and sigmoid colon
noted.  Bowel loops otherwise unremarkable.  No focal bony
abnormality.
IMPRESSION: 1.  No acute finding.
2.  Status post cholecystectomy.
3.  Unchanged small right adrenal adenoma.

## 2012-02-29 ENCOUNTER — Encounter (HOSPITAL_COMMUNITY): Payer: PRIVATE HEALTH INSURANCE | Attending: Oncology

## 2012-02-29 ENCOUNTER — Encounter (HOSPITAL_COMMUNITY): Payer: PRIVATE HEALTH INSURANCE

## 2012-02-29 DIAGNOSIS — C9000 Multiple myeloma not having achieved remission: Secondary | ICD-10-CM | POA: Insufficient documentation

## 2012-02-29 LAB — DIFFERENTIAL
Basophils Absolute: 0 10*3/uL (ref 0.0–0.1)
Basophils Relative: 0 % (ref 0–1)
Eosinophils Absolute: 0.2 10*3/uL (ref 0.0–0.7)
Neutro Abs: 9.4 10*3/uL — ABNORMAL HIGH (ref 1.7–7.7)
Neutrophils Relative %: 89 % — ABNORMAL HIGH (ref 43–77)

## 2012-02-29 LAB — CBC
MCH: 29.8 pg (ref 26.0–34.0)
MCHC: 31.6 g/dL (ref 30.0–36.0)
Platelets: 226 10*3/uL (ref 150–400)

## 2012-02-29 MED ORDER — SODIUM CHLORIDE 0.9 % IJ SOLN
10.0000 mL | INTRAMUSCULAR | Status: DC | PRN
  Filled 2012-02-29: qty 10

## 2012-02-29 MED ORDER — HEPARIN SOD (PORK) LOCK FLUSH 100 UNIT/ML IV SOLN
500.0000 [IU] | Freq: Once | INTRAVENOUS | Status: DC
  Filled 2012-02-29: qty 5

## 2012-02-29 NOTE — Progress Notes (Signed)
[name] presented for Portacath access and flush. Proper placement of portacath confirmed by CXR. Portacath located lt  chest wall accessed with  H 20 needle. Good blood return present. Portacath flushed with 20ml NS and 500U/19ml Heparin and needle removed intact. Procedure without incident. Patient tolerated procedure well.  Aranesp held due to hemoglobin 11.5  EKG ordered per T.Kefalas due to pt c/o chest pain.

## 2012-03-02 ENCOUNTER — Encounter (HOSPITAL_COMMUNITY): Payer: Medicare Other

## 2012-03-06 ENCOUNTER — Encounter: Payer: Self-pay | Admitting: Oncology

## 2012-03-06 ENCOUNTER — Other Ambulatory Visit (HOSPITAL_COMMUNITY): Payer: Self-pay | Admitting: Oncology

## 2012-03-06 ENCOUNTER — Telehealth (HOSPITAL_COMMUNITY): Payer: Self-pay | Admitting: *Deleted

## 2012-03-06 DIAGNOSIS — N3281 Overactive bladder: Secondary | ICD-10-CM

## 2012-03-06 MED ORDER — OXYBUTYNIN CHLORIDE 5 MG PO TABS: 5.0000 mg | ORAL_TABLET | Freq: Every day | ORAL | Status: DC

## 2012-03-06 NOTE — Telephone Encounter (Signed)
Pt requests refills called to [facility] on oxybutynin (DITROPAN) 5 MG tablet [[id number]]. States she is out!

## 2012-03-08 ENCOUNTER — Ambulatory Visit (HOSPITAL_COMMUNITY): Payer: PRIVATE HEALTH INSURANCE

## 2012-03-08 ENCOUNTER — Encounter (HOSPITAL_BASED_OUTPATIENT_CLINIC_OR_DEPARTMENT_OTHER): Payer: PRIVATE HEALTH INSURANCE

## 2012-03-08 ENCOUNTER — Other Ambulatory Visit (HOSPITAL_COMMUNITY): Payer: Self-pay | Admitting: Oncology

## 2012-03-08 ENCOUNTER — Ambulatory Visit (HOSPITAL_COMMUNITY)
Admission: RE | Admit: 2012-03-08 | Discharge: 2012-03-08 | Disposition: A | Discharge status: Home / Self Care | Payer: PRIVATE HEALTH INSURANCE | Source: Ambulatory Visit | Attending: Family Medicine | Admitting: Family Medicine

## 2012-03-08 DIAGNOSIS — Z139 Encounter for screening, unspecified: Secondary | ICD-10-CM

## 2012-03-08 DIAGNOSIS — E876 Hypokalemia: Secondary | ICD-10-CM

## 2012-03-08 DIAGNOSIS — Z452 Encounter for adjustment and management of vascular access device: Secondary | ICD-10-CM

## 2012-03-08 DIAGNOSIS — Z1231 Encounter for screening mammogram for malignant neoplasm of breast: Secondary | ICD-10-CM | POA: Insufficient documentation

## 2012-03-08 DIAGNOSIS — C9000 Multiple myeloma not having achieved remission: Secondary | ICD-10-CM

## 2012-03-08 LAB — CBC WITH DIFFERENTIAL/PLATELET
Basophils Relative: 0 % (ref 0–1)
Eosinophils Absolute: 1.1 10*3/uL — ABNORMAL HIGH (ref 0.0–0.7)
Hemoglobin: 11 g/dL — ABNORMAL LOW (ref 12.0–15.0)
MCH: 29.8 pg (ref 26.0–34.0)
MCHC: 31.8 g/dL (ref 30.0–36.0)
Monocytes Relative: 15 % — ABNORMAL HIGH (ref 3–12)
Neutro Abs: 2.2 10*3/uL (ref 1.7–7.7)
Neutrophils Relative %: 42 % — ABNORMAL LOW (ref 43–77)
Platelets: 175 10*3/uL (ref 150–400)
RBC: 3.69 MIL/uL — ABNORMAL LOW (ref 3.87–5.11)

## 2012-03-08 LAB — COMPREHENSIVE METABOLIC PANEL
ALT: 9 U/L (ref 0–35)
AST: 10 U/L (ref 0–37)
Albumin: 3.4 g/dL — ABNORMAL LOW (ref 3.5–5.2)
Alkaline Phosphatase: 44 U/L (ref 39–117)
Chloride: 104 mEq/L (ref 96–112)
Potassium: 3 mEq/L — ABNORMAL LOW (ref 3.5–5.1)
Sodium: 141 mEq/L (ref 135–145)
Total Bilirubin: 0.3 mg/dL (ref 0.3–1.2)
Total Protein: 7 g/dL (ref 6.0–8.3)

## 2012-03-08 MED ORDER — POTASSIUM CHLORIDE CRYS ER 20 MEQ PO TBCR: EXTENDED_RELEASE_TABLET | ORAL | Status: DC

## 2012-03-08 MED ORDER — SODIUM CHLORIDE 0.9 % IJ SOLN
10.0000 mL | INTRAMUSCULAR | Status: DC | PRN
  Administered 2012-03-08: 10 mL via INTRAVENOUS
  Filled 2012-03-08: qty 10

## 2012-03-08 MED ORDER — OXYCODONE-ACETAMINOPHEN 5-325 MG PO TABS: 1.0000 | ORAL_TABLET | Freq: Four times a day (QID) | ORAL | Status: DC | PRN

## 2012-03-08 MED ORDER — HEPARIN SOD (PORK) LOCK FLUSH 100 UNIT/ML IV SOLN
500.0000 [IU] | Freq: Once | INTRAVENOUS | Status: AC
  Administered 2012-03-08: 500 [IU] via INTRAVENOUS
  Filled 2012-03-08: qty 5

## 2012-03-08 NOTE — Progress Notes (Signed)
[name] presented for Portacath access and flush. Proper placement of portacath confirmed by CXR. Portacath located rt chest wall accessed with  H 20 needle. Good blood return present. Portacath flushed with 20ml NS and 500U/36ml Heparin and needle removed intact. Procedure without incident. Patient tolerated procedure well chief c/o weakness.

## 2012-03-08 NOTE — Addendum Note (Signed)
Addended by: Edythe Lynn A on: 03/08/2012 12:54 PM   Modules accepted: Orders

## 2012-03-09 LAB — KAPPA/LAMBDA LIGHT CHAINS
Kappa free light chain: 2.47 mg/dL — ABNORMAL HIGH (ref 0.33–1.94)
Kappa, lambda light chain ratio: 0.54 (ref 0.26–1.65)
Lambda free light chains: 4.54 mg/dL — ABNORMAL HIGH (ref 0.57–2.63)

## 2012-03-12 ENCOUNTER — Encounter (HOSPITAL_BASED_OUTPATIENT_CLINIC_OR_DEPARTMENT_OTHER): Payer: PRIVATE HEALTH INSURANCE | Admitting: Oncology

## 2012-03-12 ENCOUNTER — Ambulatory Visit (INDEPENDENT_AMBULATORY_CARE_PROVIDER_SITE_OTHER): Payer: PRIVATE HEALTH INSURANCE | Admitting: Cardiology

## 2012-03-12 ENCOUNTER — Encounter: Payer: Self-pay | Admitting: Cardiology

## 2012-03-12 ENCOUNTER — Other Ambulatory Visit (HOSPITAL_COMMUNITY): Payer: PRIVATE HEALTH INSURANCE

## 2012-03-12 VITALS — BP 132/76 | HR 62 | Temp 99.7°F | Resp 18 | Wt 225.2 lb

## 2012-03-12 VITALS — BP 124/70 | HR 59 | Ht 65.0 in | Wt 225.0 lb

## 2012-03-12 DIAGNOSIS — I10 Essential (primary) hypertension: Secondary | ICD-10-CM

## 2012-03-12 DIAGNOSIS — I2510 Atherosclerotic heart disease of native coronary artery without angina pectoris: Secondary | ICD-10-CM

## 2012-03-12 DIAGNOSIS — E785 Hyperlipidemia, unspecified: Secondary | ICD-10-CM

## 2012-03-12 DIAGNOSIS — C9000 Multiple myeloma not having achieved remission: Secondary | ICD-10-CM

## 2012-03-12 DIAGNOSIS — I679 Cerebrovascular disease, unspecified: Secondary | ICD-10-CM | POA: Insufficient documentation

## 2012-03-12 DIAGNOSIS — G609 Hereditary and idiopathic neuropathy, unspecified: Secondary | ICD-10-CM

## 2012-03-12 DIAGNOSIS — R7303 Prediabetes: Secondary | ICD-10-CM

## 2012-03-12 DIAGNOSIS — K219 Gastro-esophageal reflux disease without esophagitis: Secondary | ICD-10-CM | POA: Insufficient documentation

## 2012-03-12 DIAGNOSIS — D649 Anemia, unspecified: Secondary | ICD-10-CM

## 2012-03-12 DIAGNOSIS — R7309 Other abnormal glucose: Secondary | ICD-10-CM

## 2012-03-12 DIAGNOSIS — I7090 Unspecified atherosclerosis: Secondary | ICD-10-CM

## 2012-03-12 DIAGNOSIS — G8929 Other chronic pain: Secondary | ICD-10-CM

## 2012-03-12 NOTE — Progress Notes (Signed)
Patient ID: [name], female   DOB: [birth year], 71 y.o.   MRN: [id number]  HPI: Scheduled return visit for this nice woman, who I've not seen in the office for some time, now referred back following CVA.  She suffered a left basal ganglia hypertensive bleed approximately one month ago presenting with left-sided weakness and gait disturbance.  Hospital course was uneventful.  Blood pressure was initially elevated, but was adequately controlled at discharge.  Home physical therapy and occupational therapy were started, but were discontinued approximately one week ago.  Patient is uncertain why therapy has been discontinued and whether she is to followup with a neurologist.  Prior to Admission medications   Medication Sig Start Date End Date Taking? Authorizing Provider  amLODipine (NORVASC) 5 MG tablet Take 1 tablet (5 mg total) by mouth daily. 02/10/12  Yes Layne Benton, NP  aspirin 81 MG tablet Take 81 mg by mouth daily.   Yes Historical Provider, MD  Bisacodyl (DULCOLAX PO) Take 2 tablets by mouth daily as needed. Constipation   Yes Historical Provider, MD  dexamethasone (DECADRON) 4 MG tablet Take 20 mg by mouth once a week. Wednesday   Yes Historical Provider, MD  DIOVAN 160 MG tablet Take 1 tablet by mouth 2 (two) times daily.   Yes Historical Provider, MD  diphenhydramine-acetaminophen (TYLENOL PM) 25-500 MG TABS Take 1 tablet by mouth at bedtime as needed. Pain/Sleep   Yes Historical Provider, MD  furosemide (LASIX) 20 MG tablet Take 20 mg by mouth as needed. Fluid Retention   Yes Historical Provider, MD  nebivolol (BYSTOLIC) 5 MG tablet Take 2.5-10 mg by mouth 2 (two) times daily. Take 10 mg in the am and take 2.5 mg in the pm by mouth daily. 12/07/11  Yes Ellouise Newer, PA  omeprazole (PRILOSEC) 20 MG capsule Take 20 mg by mouth 2 (two) times daily.    Yes Historical Provider, MD  oxybutynin (DITROPAN) 5 MG tablet Take 1 tablet (5 mg total) by mouth daily. 03/06/12  Yes Ellouise Newer, PA  oxyCODONE-acetaminophen (PERCOCET/ROXICET) 5-325 MG per tablet Take 1 tablet by mouth every 6 (six) hours as needed. pain 03/08/12  Yes Ellouise Newer, PA  pomalidomide (POMALYST) 4 MG capsule Take 1 tablet, PO daily x 21 days with 7 day respite (starting on 02/13/2012) 02/03/12  Yes Ellouise Newer, PA  potassium chloride SA (K-DUR,KLOR-CON) 20 MEQ tablet Take 1 tablet BID x 14 days, then 1 daily 03/08/12  Yes Ellouise Newer, PA  rosuvastatin (CRESTOR) 20 MG tablet Take 20 mg by mouth at bedtime.     Yes Historical Provider, MD   Allergies  Allergen Reactions  . Gabapentin Other (See Comments)    Difficulty with speech  . Simvastatin     Leg Cramps      Past medical history, social history, and family history reviewed and updated.  ROS: Denies chest pain, dyspnea, orthopnea, PND, palpitations, lightheadedness or syncope.  She experiences vertigo when she assumes a recumbent position that lasts seconds to minutes.  She has chronic numbness and tenderness of the lower extremities attributed to treatment with a antineoplastic medication.  She has been doing fairly well at home, but still has difficulty with balance and develops fatigue of the left side with modest effort.  All other systems reviewed and are negative.  PHYSICAL EXAM: BP 124/70  Pulse 59  Ht 5\' 5"  (1.651 m)  Wt 102.059 kg (225 lb)  BMI 37.44 kg/m2  SpO2  92%  General-Well developed; no acute distress Body habitus-Moderately overweight Neck-No JVD; no carotid bruits Lungs-clear lung fields; resonant to percussion Cardiovascular-normal PMI; normal S1 and S2; modest systolic ejection murmur Abdomen-normal bowel sounds; soft and non-tender without masses or organomegaly Musculoskeletal-No deformities, no cyanosis or clubbing Neurologic-Normal cranial nerves; Slight pronator drift of the left upper extremity; memory and mental status appear normal. Skin-Warm, no significant lesions Extremities-distal pulses  intact; Lower extremities tender to palpation below the knees; 1/2+ pretibial and ankle edema  ASSESSMENT AND PLAN:  Poland Bing, MD 03/12/2012 1:50 PM

## 2012-03-12 NOTE — Patient Instructions (Addendum)
Your physician recommends that you schedule a follow-up appointment in: 10 months  

## 2012-03-12 NOTE — Assessment & Plan Note (Signed)
Blood pressure control is excellent and has been for at least the past year.

## 2012-03-12 NOTE — Assessment & Plan Note (Addendum)
No recent lipid profile available; one will be obtained. 

## 2012-03-12 NOTE — Assessment & Plan Note (Addendum)
Records of recent hospitalization reviewed.  Patient is to followup with Dr. Pearlean Brownie in 03/2012.  We will discuss the advisability of additional physical therapy with Home Health.  She has persistent mild left hemiparesis.  Since she has done so well in the immediate post stroke interval, continuing gradual improvement in neurologic function is likely.

## 2012-03-12 NOTE — Progress Notes (Deleted)
Name: [name]    DOB: [birth year]  Age: 71 y.o.  MR#: [id number]       PCP:  [name], MD      Insurance: @PAYORNAME @   CC:   No chief complaint on file.  MEDICATION LIST ECHO CUS ON 02/08/12  VS BP 124/70  Pulse 59  Ht 5\' 5"  (1.651 m)  Wt 225 lb (102.059 kg)  BMI 37.44 kg/m2  SpO2 92%  Weights Current Weight  03/12/12 225 lb (102.059 kg)  03/12/12 225 lb 3.2 oz (102.15 kg)  02/07/12 225 lb 1.4 oz (102.1 kg)    Blood Pressure  BP Readings from Last 3 Encounters:  03/12/12 124/70  03/12/12 132/76  02/10/12 118/64     Admit date:  (Not on file) Last encounter with RMR:  Visit date not found   Allergy Allergies  Allergen Reactions  . Gabapentin Other (See Comments)    Difficulty with speech  . Simvastatin     Leg Cramps     Current Outpatient Prescriptions  Medication Sig Dispense Refill  . amLODipine (NORVASC) 5 MG tablet Take 1 tablet (5 mg total) by mouth daily.  30 tablet  2  . aspirin 81 MG tablet Take 81 mg by mouth daily.      . Bisacodyl (DULCOLAX PO) Take 2 tablets by mouth daily as needed. Constipation      . dexamethasone (DECADRON) 4 MG tablet Take 20 mg by mouth once a week. Wednesday      . DIOVAN 160 MG tablet Take 1 tablet by mouth 2 (two) times daily.      . diphenhydramine-acetaminophen (TYLENOL PM) 25-500 MG TABS Take 1 tablet by mouth at bedtime as needed. Pain/Sleep      . furosemide (LASIX) 20 MG tablet Take 20 mg by mouth as needed. Fluid Retention      . nebivolol (BYSTOLIC) 5 MG tablet Take 2.5-10 mg by mouth 2 (two) times daily. Take 10 mg in the am and take 2.5 mg in the pm by mouth daily.      Marland Kitchen omeprazole (PRILOSEC) 20 MG capsule Take 20 mg by mouth 2 (two) times daily.       Marland Kitchen oxybutynin (DITROPAN) 5 MG tablet Take 1 tablet (5 mg total) by mouth daily.  30 tablet  3  . oxyCODONE-acetaminophen (PERCOCET/ROXICET) 5-325 MG per tablet Take 1 tablet by mouth every 6 (six) hours as needed. pain  60 tablet  0  . pomalidomide  (POMALYST) 4 MG capsule Take 1 tablet, PO daily x 21 days with 7 day respite (starting on 02/13/2012)  21 capsule  1  . potassium chloride SA (K-DUR,KLOR-CON) 20 MEQ tablet Take 1 tablet BID x 14 days, then 1 daily  45 tablet  1  . rosuvastatin (CRESTOR) 20 MG tablet Take 20 mg by mouth at bedtime.        . [DISCONTINUED] oxybutynin (DITROPAN) 5 MG tablet Take 1 tablet (5 mg total) by mouth daily.  30 tablet  3   No current facility-administered medications for this visit.   Facility-Administered Medications Ordered in Other Visits  Medication Dose Route Frequency Provider Last Rate Last Dose  . sodium chloride 0.9 % injection 10 mL  10 mL Intravenous PRN Randall An, MD   10 mL at 10/26/11 1119    Discontinued Meds:   There are no discontinued medications.  Patient Active Problem List  Diagnosis  . MULTIPLE  MYELOMA  . Anemia  . Obesity, Class II,  BMI 35-39.9, with comorbidity  . Hypertension  . Arteriosclerotic cardiovascular disease (ASCVD)  . Borderline diabetes  . GERD (gastroesophageal reflux disease)  . Hyperlipidemia    LABS Infusion on 03/08/2012  Component Date Value  . WBC 03/08/2012 5.2   . RBC 03/08/2012 3.69*  . Hemoglobin 03/08/2012 11.0*  . HCT 03/08/2012 34.6*  . MCV 03/08/2012 93.8   . [facility] 03/08/2012 29.8   . MCHC 03/08/2012 31.8   . RDW 03/08/2012 16.4*  . Platelets 03/08/2012 175   . Neutrophils Relative 03/08/2012 42*  . Neutro Abs 03/08/2012 2.2   . Lymphocytes Relative 03/08/2012 22   . Lymphs Abs 03/08/2012 1.1   . Monocytes Relative 03/08/2012 15*  . Monocytes Absolute 03/08/2012 0.8   . Eosinophils Relative 03/08/2012 22*  . Eosinophils Absolute 03/08/2012 1.1*  . Basophils Relative 03/08/2012 0   . Basophils Absolute 03/08/2012 0.0   . Sodium 03/08/2012 141   . Potassium 03/08/2012 3.0*  . Chloride 03/08/2012 104   . CO2 03/08/2012 30   . Glucose, Bld 03/08/2012 179*  . BUN 03/08/2012 13   . Creatinine, Ser 03/08/2012 1.05   .  Calcium 03/08/2012 10.1   . Total Protein 03/08/2012 7.0   . Albumin 03/08/2012 3.4*  . AST 03/08/2012 10   . ALT 03/08/2012 9   . Alkaline Phosphatase 03/08/2012 44   . Total Bilirubin 03/08/2012 0.3   . GFR calc non Af Amer 03/08/2012 53*  . GFR calc Af Amer 03/08/2012 61*  . Total Protein 03/08/2012 6.4   . Albumin ELP 03/08/2012 PENDING   . Alpha-1-Globulin 03/08/2012 PENDING   . Alpha-2-Globulin 03/08/2012 PENDING   . Beta Globulin 03/08/2012 PENDING   . Beta 2 03/08/2012 PENDING   . Gamma Globulin 03/08/2012 PENDING   . M-Spike, % 03/08/2012 PENDING   . SPE Interp. 03/08/2012 PENDING   . Comment 03/08/2012 PENDING   . IgG (Immunoglobin G), Se* 03/08/2012 1120   . IgA 03/08/2012 112   . IgM, Serum 03/08/2012 55*  . Immunofix Electr Int 03/08/2012 PENDING   . Kappa free light chain 03/08/2012 2.47*  . Lamda free light chains 03/08/2012 4.54*  . Kappa, lamda light chain* 03/08/2012 0.54   Infusion on 02/29/2012  Component Date Value  . WBC 02/29/2012 10.5   . RBC 02/29/2012 3.86*  . Hemoglobin 02/29/2012 11.5*  . HCT 02/29/2012 36.4   . MCV 02/29/2012 94.3   . [facility] 02/29/2012 29.8   . MCHC 02/29/2012 31.6   . RDW 02/29/2012 16.7*  . Platelets 02/29/2012 226   . Neutrophils Relative 02/29/2012 89*  . Neutro Abs 02/29/2012 9.4*  . Lymphocytes Relative 02/29/2012 8*  . Lymphs Abs 02/29/2012 0.8   . Monocytes Relative 02/29/2012 2*  . Monocytes Absolute 02/29/2012 0.2   . Eosinophils Relative 02/29/2012 2   . Eosinophils Absolute 02/29/2012 0.2   . Basophils Relative 02/29/2012 0   . Basophils Absolute 02/29/2012 0.0   Admission on 02/07/2012, Discharged on 02/10/2012  Component Date Value  . WBC 02/07/2012 5.5   . RBC 02/07/2012 3.46*  . Hemoglobin 02/07/2012 10.3*  . HCT 02/07/2012 34.2*  . MCV 02/07/2012 98.8   . [facility] 02/07/2012 29.8   . MCHC 02/07/2012 30.1   . RDW 02/07/2012 17.1*  . Platelets 02/07/2012 212   . Neutrophils Relative 02/07/2012 44   .  Neutro Abs 02/07/2012 2.4   . Lymphocytes Relative 02/07/2012 23   . Lymphs Abs 02/07/2012 1.3   . Monocytes Relative 02/07/2012 20*  .  Monocytes Absolute 02/07/2012 1.1*  . Eosinophils Relative 02/07/2012 13*  . Eosinophils Absolute 02/07/2012 0.7   . Basophils Relative 02/07/2012 0   . Basophils Absolute 02/07/2012 0.0   . Prothrombin Time 02/07/2012 13.1   . INR 02/07/2012 1.00   . aPTT 02/07/2012 25   . WBC 02/07/2012 6.0   . RBC 02/07/2012 3.93   . Hemoglobin 02/07/2012 11.6*  . HCT 02/07/2012 38.1   . MCV 02/07/2012 96.9   . [facility] 02/07/2012 29.5   . MCHC 02/07/2012 30.4   . RDW 02/07/2012 17.3*  . Platelets 02/07/2012 213   . Sodium 02/07/2012 141   . Potassium 02/07/2012 3.6   . Chloride 02/07/2012 105   . CO2 02/07/2012 26   . Glucose, Bld 02/07/2012 95   . BUN 02/07/2012 23   . Creatinine, Ser 02/07/2012 1.05   . Calcium 02/07/2012 9.6   . Total Protein 02/07/2012 7.0   . Albumin 02/07/2012 3.6   . AST 02/07/2012 8   . ALT 02/07/2012 10   . Alkaline Phosphatase 02/07/2012 47   . Total Bilirubin 02/07/2012 0.5   . GFR calc non Af Amer 02/07/2012 53*  . GFR calc Af Amer 02/07/2012 61*  . MRSA by PCR 02/07/2012 NEGATIVE   . Hemoglobin A1C 02/07/2012 6.1*  . Mean Plasma Glucose 02/07/2012 128*  . Cholesterol 02/08/2012 142   . Triglycerides 02/08/2012 93   . HDL 02/08/2012 49   . Total CHOL/HDL Ratio 02/08/2012 2.9   . VLDL 02/08/2012 19   . LDL Cholesterol 02/08/2012 74   . Glucose-Capillary 02/07/2012 98   . Comment 1 02/07/2012 Notify RN   . Comment 2 02/07/2012 Documented in Chart   . Glucose-Capillary 02/08/2012 108*  Infusion on 02/07/2012  Component Date Value  . WBC 02/07/2012 5.4   . RBC 02/07/2012 3.52*  . Hemoglobin 02/07/2012 10.7*  . HCT 02/07/2012 34.6*  . MCV 02/07/2012 98.3   . [facility] 02/07/2012 30.4   . MCHC 02/07/2012 30.9   . RDW 02/07/2012 17.6*  . Platelets 02/07/2012 224   . Neutrophils Relative 02/07/2012 52   . Neutro Abs  02/07/2012 2.8   . Lymphocytes Relative 02/07/2012 21   . Lymphs Abs 02/07/2012 1.2   . Monocytes Relative 02/07/2012 17*  . Monocytes Absolute 02/07/2012 0.9   . Eosinophils Relative 02/07/2012 10*  . Eosinophils Absolute 02/07/2012 0.6   . Basophils Relative 02/07/2012 0   . Basophils Absolute 02/07/2012 0.0   . Sodium 02/07/2012 142   . Potassium 02/07/2012 3.0*  . Chloride 02/07/2012 106   . CO2 02/07/2012 27   . Glucose, Bld 02/07/2012 155*  . BUN 02/07/2012 26*  . Creatinine, Ser 02/07/2012 1.16*  . Calcium 02/07/2012 9.3   . Total Protein 02/07/2012 6.7   . Albumin 02/07/2012 3.4*  . AST 02/07/2012 10   . ALT 02/07/2012 9   . Alkaline Phosphatase 02/07/2012 43   . Total Bilirubin 02/07/2012 0.4   . GFR calc non Af Amer 02/07/2012 47*  . GFR calc Af Amer 02/07/2012 54*  . Total Protein 02/07/2012 6.3   . Albumin ELP 02/07/2012 57.0   . Alpha-1-Globulin 02/07/2012 5.2*  . Alpha-2-Globulin 02/07/2012 12.4*  . Beta Globulin 02/07/2012 5.4   . Beta 2 02/07/2012 5.0   . Gamma Globulin 02/07/2012 15.0   . M-Spike, % 02/07/2012 0.31   . SPE Interp. 02/07/2012 (NOTE)   . Comment 02/07/2012 (NOTE)   . IgG (Immunoglobin G), Se* 02/07/2012 1070   .  IgA 02/07/2012 135   . IgM, Serum 02/07/2012 54*  . Immunofix Electr Int 02/07/2012 (NOTE)   . Kappa free light chain 02/07/2012 0.98   . Lamda free light chains 02/07/2012 4.06*  . Kappa, lamda light chain* 02/07/2012 0.24*  Office Visit on 01/10/2012  Component Date Value  . WBC 01/10/2012 4.8   . RBC 01/10/2012 3.46*  . Hemoglobin 01/10/2012 10.5*  . HCT 01/10/2012 33.4*  . MCV 01/10/2012 96.5   . [facility] 01/10/2012 30.3   . MCHC 01/10/2012 31.4   . RDW 01/10/2012 20.3*  . Platelets 01/10/2012 243   . Neutrophils Relative 01/10/2012 34*  . Neutro Abs 01/10/2012 1.6*  . Lymphocytes Relative 01/10/2012 39   . Lymphs Abs 01/10/2012 1.9   . Monocytes Relative 01/10/2012 16*  . Monocytes Absolute 01/10/2012 0.8   .  Eosinophils Relative 01/10/2012 10*  . Eosinophils Absolute 01/10/2012 0.5   . Basophils Relative 01/10/2012 0   . Basophils Absolute 01/10/2012 0.0   . Sodium 01/10/2012 135   . Potassium 01/10/2012 4.2   . Chloride 01/10/2012 104   . CO2 01/10/2012 25   . Glucose, Bld 01/10/2012 102*  . BUN 01/10/2012 15   . Creatinine, Ser 01/10/2012 1.14*  . Calcium 01/10/2012 9.7   . Total Protein 01/10/2012 6.8   . Albumin 01/10/2012 3.4*  . AST 01/10/2012 14   . ALT 01/10/2012 12   . Alkaline Phosphatase 01/10/2012 50   . Total Bilirubin 01/10/2012 0.4   . GFR calc non Af Amer 01/10/2012 48*  . GFR calc Af Amer 01/10/2012 56*  . Total Protein 01/10/2012 6.3   . Albumin ELP 01/10/2012 56.6   . Alpha-1-Globulin 01/10/2012 5.3*  . Alpha-2-Globulin 01/10/2012 11.7   . Beta Globulin 01/10/2012 5.1   . Beta 2 01/10/2012 5.2   . Gamma Globulin 01/10/2012 16.1   . M-Spike, % 01/10/2012 0.33   . SPE Interp. 01/10/2012 (NOTE)   . Comment 01/10/2012 (NOTE)   . IgG (Immunoglobin G), Se* 01/10/2012 1220   . IgA 01/10/2012 132   . IgM, Serum 01/10/2012 57*  . Immunofix Electr Int 01/10/2012 (NOTE)   . Kappa free light chain 01/10/2012 1.96*  . Lamda free light chains 01/10/2012 4.77*  . Kappa, lamda light chain* 01/10/2012 0.41   Infusion on 01/04/2012  Component Date Value  . WBC 01/04/2012 3.9*  . RBC 01/04/2012 3.47*  . Hemoglobin 01/04/2012 10.4*  . HCT 01/04/2012 33.5*  . MCV 01/04/2012 96.5   . [facility] 01/04/2012 30.0   . MCHC 01/04/2012 31.0   . RDW 01/04/2012 20.1*  . Platelets 01/04/2012 247   . Neutrophils Relative 01/04/2012 39*  . Neutro Abs 01/04/2012 1.5*  . Lymphocytes Relative 01/04/2012 24   . Lymphs Abs 01/04/2012 0.9   . Monocytes Relative 01/04/2012 8   . Monocytes Absolute 01/04/2012 0.3   . Eosinophils Relative 01/04/2012 27*  . Eosinophils Absolute 01/04/2012 1.0*  . Basophils Relative 01/04/2012 0   . Basophils Absolute 01/04/2012 0.0   Infusion on 12/30/2011    Component Date Value  . WBC 12/30/2011 5.2   . RBC 12/30/2011 3.44*  . Hemoglobin 12/30/2011 10.4*  . HCT 12/30/2011 32.7*  . MCV 12/30/2011 95.1   . [facility] 12/30/2011 30.2   . MCHC 12/30/2011 31.8   . RDW 12/30/2011 20.2*  . Platelets 12/30/2011 251   Infusion on 12/21/2011  Component Date Value  . Sodium 12/21/2011 136   . Potassium 12/21/2011 3.9   . Chloride 12/21/2011 103   .  CO2 12/21/2011 24   . Glucose, Bld 12/21/2011 147*  . BUN 12/21/2011 11   . Creatinine, Ser 12/21/2011 1.22*  . Calcium 12/21/2011 9.9   . Total Protein 12/21/2011 6.7   . Albumin 12/21/2011 3.4*  . AST 12/21/2011 12   . ALT 12/21/2011 12   . Alkaline Phosphatase 12/21/2011 50   . Total Bilirubin 12/21/2011 0.4   . GFR calc non Af Amer 12/21/2011 44*  . GFR calc Af Amer 12/21/2011 51*  . Total Protein 12/21/2011 6.6   . Albumin ELP 12/21/2011 58.1   . Alpha-1-Globulin 12/21/2011 5.6*  . Alpha-2-Globulin 12/21/2011 12.1*  . Beta Globulin 12/21/2011 5.4   . Beta 2 12/21/2011 4.7   . Gamma Globulin 12/21/2011 14.1   . M-Spike, % 12/21/2011 0.36   . SPE Interp. 12/21/2011 (NOTE)   . Comment 12/21/2011 (NOTE)   . IgG (Immunoglobin G), Se* 12/21/2011 1030   . IgA 12/21/2011 108   . IgM, Serum 12/21/2011 56*  . Immunofix Electr Int 12/21/2011 (NOTE)   . Kappa free light chain 12/21/2011 0.97   . Lamda free light chains 12/21/2011 3.40*  . Kappa, lamda light chain* 12/21/2011 0.29   . WBC 12/21/2011 6.9   . RBC 12/21/2011 3.51*  . Hemoglobin 12/21/2011 10.5*  . HCT 12/21/2011 33.1*  . MCV 12/21/2011 94.3   . [facility] 12/21/2011 29.9   . MCHC 12/21/2011 31.7   . RDW 12/21/2011 21.3*  . Platelets 12/21/2011 251   . Neutrophils Relative 12/21/2011 75   . Neutro Abs 12/21/2011 5.2   . Lymphocytes Relative 12/21/2011 14   . Lymphs Abs 12/21/2011 1.0   . Monocytes Relative 12/21/2011 4   . Monocytes Absolute 12/21/2011 0.3   . Eosinophils Relative 12/21/2011 6*  . Eosinophils Absolute 12/21/2011 0.4    . Basophils Relative 12/21/2011 0   . Basophils Absolute 12/21/2011 0.0      Results for this Opt Visit:     Results for orders placed in visit on 03/08/12  CBC WITH DIFFERENTIAL      Component Value Range   WBC 5.2  4.0 - 10.5 K/uL   RBC 3.69 (*) 3.87 - 5.11 MIL/uL   Hemoglobin 11.0 (*) 12.0 - 15.0 g/dL   HCT 11.9 (*) 14.7 - 82.9 %   MCV 93.8  78.0 - 100.0 fL   MCH 29.8  26.0 - 34.0 pg   MCHC 31.8  30.0 - 36.0 g/dL   RDW 56.2 (*) 13.0 - 86.5 %   Platelets 175  150 - 400 K/uL   Neutrophils Relative 42 (*) 43 - 77 %   Neutro Abs 2.2  1.7 - 7.7 K/uL   Lymphocytes Relative 22  12 - 46 %   Lymphs Abs 1.1  0.7 - 4.0 K/uL   Monocytes Relative 15 (*) 3 - 12 %   Monocytes Absolute 0.8  0.1 - 1.0 K/uL   Eosinophils Relative 22 (*) 0 - 5 %   Eosinophils Absolute 1.1 (*) 0.0 - 0.7 K/uL   Basophils Relative 0  0 - 1 %   Basophils Absolute 0.0  0.0 - 0.1 K/uL  COMPREHENSIVE METABOLIC PANEL      Component Value Range   Sodium 141  135 - 145 mEq/L   Potassium 3.0 (*) 3.5 - 5.1 mEq/L   Chloride 104  96 - 112 mEq/L   CO2 30  19 - 32 mEq/L   Glucose, Bld 179 (*) 70 - 99 mg/dL   BUN 13  6 - 23 mg/dL   Creatinine, Ser 1.19  0.50 - 1.10 mg/dL   Calcium 14.7  8.4 - 82.9 mg/dL   Total Protein 7.0  6.0 - 8.3 g/dL   Albumin 3.4 (*) 3.5 - 5.2 g/dL   AST 10  0 - 37 U/L   ALT 9  0 - 35 U/L   Alkaline Phosphatase 44  39 - 117 U/L   Total Bilirubin 0.3  0.3 - 1.2 mg/dL   GFR calc non Af Amer 53 (*) >90 mL/min   GFR calc Af Amer 61 (*) >90 mL/min  MULTIPLE MYELOMA PANEL, SERUM      Component Value Range   Total Protein 6.4  6.0 - 8.3 g/dL   Albumin ELP PENDING  55.8 - 66.1 %   Alpha-1-Globulin PENDING  2.9 - 4.9 %   Alpha-2-Globulin PENDING  7.1 - 11.8 %   Beta Globulin PENDING  4.7 - 7.2 %   Beta 2 PENDING  3.2 - 6.5 %   Gamma Globulin PENDING  11.1 - 18.8 %   M-Spike, % PENDING     SPE Interp. PENDING     Comment PENDING     IgG (Immunoglobin G), Serum 1120  690 - 1700 mg/dL   IgA  562  69 - 130 mg/dL   IgM, Serum 55 (*) 52 - 322 mg/dL   Immunofix Electr Int PENDING    KAPPA/LAMBDA LIGHT CHAINS      Component Value Range   Kappa free light chain 2.47 (*) 0.33 - 1.94 mg/dL   Lamda free light chains 4.54 (*) 0.57 - 2.63 mg/dL   Kappa, lamda light chain ratio 0.54  0.26 - 1.65    EKG Orders placed in visit on 02/29/12  . EKG 12-LEAD  . EKG 12-LEAD     Prior Assessment and Plan Problem List as of 03/12/2012            Cardiology Problems   Hypertension   Arteriosclerotic cardiovascular disease (ASCVD)   Hyperlipidemia     Other   MULTIPLE  MYELOMA   Anemia   Obesity, Class II, BMI 35-39.9, with comorbidity   Borderline diabetes   GERD (gastroesophageal reflux disease)       Imaging: Mm Digital Screening  03/09/2012  *RADIOLOGY REPORT*  Clinical Data: Screening.  DIGITAL BILATERAL SCREENING MAMMOGRAM WITH CAD  Comparison: Previous exams.  Findings: The breast tissue is almost entirely fatty. No suspicious masses, architectural distortion, or calcifications are present.  Images were processed with CAD.  IMPRESSION: No mammographic evidence of malignancy.  A result letter of this screening mammogram will be mailed directly to the patient.  RECOMMENDATION: Screening mammogram in one year. (Code:SM-B-01Y)  BI-RADS CATEGORY 1:  Negative.   Original Report Authenticated By: Beckie Salts, M.D.      [facility] Calculation: Score not calculated. Missing: Total Cholesterol

## 2012-03-12 NOTE — Patient Instructions (Addendum)
[facility]  Discharge Instructions  RECOMMENDATIONS MADE BY THE CONSULTANT AND ANY TEST RESULTS WILL BE SENT TO YOUR REFERRING DOCTOR.   Restart Pomalyst Sunday November 24th. (take for 21 days and then stop for 7 days) Continue taking the dexamethasone as you have been doing weekly. Apply a warm bath cloth to the affected eye, you may use a gentle cleanser such as Laural Benes n [employer] shampoo. Return to clinic in 1 month to see Tom and 2 months to see Dr.Neijstrom. Lab work again in 1 month.   I acknowledge that I have been informed and understand all the instructions given to me and received a copy. I do not have any more questions at this time, but understand that I may call the Specialty Clinic at [facility] at [phone] during business hours should I have any further questions or need assistance in obtaining follow-up care.    __________________________________________  _____________  __________ Signature of Patient or Authorized Representative            Date                   Time    __________________________________________ Nurse's Signature

## 2012-03-12 NOTE — Assessment & Plan Note (Signed)
Recently exacerbated by treatment with corticosteroids with a value of 179 recorded; otherwise FBS generally 95-120.  Hemoglobin A1c of 6.1 in 01/2012.

## 2012-03-12 NOTE — Assessment & Plan Note (Signed)
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

## 2012-03-12 NOTE — Progress Notes (Signed)
Diane Stalling, MD [address] [place] [address]  1. MULTIPLE  MYELOMA     CURRENT THERAPY: Pomalyst 21 days on and 7 days off. Due to start cycle 7 on 03/18/2012.  INTERVAL HISTORY: [name] 71 y.o. female returns for  regular  visit for followup of   IgG lambda multiple myeloma evolving out of an MGUS since 1997 but with overt myeloma since 07/16/2009. 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   Unfortunately, [place] had a hemorrhagic stroke for which she was admitted at [facility] hospital under the stoke team.  She is recovering from that incident and is regaining her strength.  She remains taking the Pomalyst with weekly Dexamethasone for her multiple myeloma treatment.  Thus far, her labs remain stable, but lab work from 03/08/2012 (multiple myeloma panel) is pending.  She completed her 6th cycle of Pomalyst yesterday (03/11/2012).  She will contact her specialty pharmacy for refill.  She complains of left eye discharge that is yellow in nature and prevents her from being able to open her left eye in the AM.  I recommended a warm wash cloth with Laural Benes and Laural Benes baby shampoo to help loosen that material.   Otherwise, she denies any complaints.  She reports that there is a learning curve with the usage of her female urinal we provided her the other week.  Complete ROS questioning is negative.  She denies any pain.  She is ambulating at home.  She denies any loss of LE sensation or bowel and bladder control.   Past Medical History  Diagnosis Date  . Hypertension   . Arteriosclerotic cardiovascular disease (ASCVD)     Former patient of [facility] - PTCA apical LAD 2005  . Borderline diabetes     + proteinuria  . GERD (gastroesophageal reflux disease)     + constipation, gastroparesis  . Hyperlipidemia   . Female stress incontinence     H/o urinary tract infection  . Vertigo   . Insomnia   . Colon cancer     1975  . Multiple  myeloma(203.0)   . Osteoarthritis     cervical spine; status post surgery  . CVA (cerebral infarction) 01/2012    has MULTIPLE  MYELOMA; Anemia; Obesity, Class II, BMI 35-39.9, with comorbidity; Hypertension; Arteriosclerotic cardiovascular disease (ASCVD); Borderline diabetes; GERD (gastroesophageal reflux disease); and Hyperlipidemia on her problem list.     is allergic to gabapentin and simvastatin.  [name] had no medications administered during this visit.  Past Surgical History  Procedure Date  . Partial colectomy 1995     [facility], colon cancer  . Abdominal hysterectomy 1970s    [facility]; secondary to metromenorrhagia  . Meniscus repair 2003    Arthroscopic, left,  [facility]  . Heel spur surgery     [facility], [facility]  . Cervical spine surgery 2004    [facility]  . Port-a-cath removal 11/05/2010    Procedure: REMOVAL PORT-A-CATH;  Surgeon: Dalia Heading;  Location: [facility];  Service: General;  Laterality: Left;  started at 0846  . Portacath placement 11/05/2010    Procedure: INSERTION PORT-A-CATH;  Surgeon: Dalia Heading;  Location: [facility];  Service: General;  Laterality: Right;  Right subclavian, device left accessed   . Colonoscopy  07/02/2007    Normal rectum status post prior sigmoid resection with normal anastomosis identified at 35 cm.  Normal residual colon.Marland Kitchen Next TCS 06/2012  . Esophagogastroduodenoscopy   08/22/2007  Small hiatal hernia/A circular, purplish, 1-cm, distal esophageal submucosal lesion of uncertain significance, otherwise normal esophagus. EUS -esophageal bleb (aberrant blood vessel). retained gastric contents/food  . Appendectomy 1953    Denies any headaches, dizziness, double vision, fevers, chills, night sweats, nausea, vomiting, diarrhea, constipation, chest pain, heart palpitations, shortness of breath, blood in stool, black tarry stool, urinary pain, urinary burning, urinary frequency, hematuria.   PHYSICAL EXAMINATION  ECOG PERFORMANCE STATUS: 2 - Symptomatic, <50%  confined to bed  Filed Vitals:   03/12/12 1100  BP: 132/76  Pulse: 62  Temp: 99.7 F (37.6 C)  Resp: 18    GENERAL:alert, no distress, well nourished, well developed, comfortable, cooperative, obese and smiling.  Accompanied by daughter. SKIN: skin color, texture, turgor are normal, no rashes or significant lesions HEAD: Normocephalic, No masses, lesions, tenderness or abnormalities EYES: normal, Conjunctiva are pink and non-injected. Removable plaque like, hard, dried, discharge at the lateral and medial aspect of orbit.  EARS: External ears normal OROPHARYNX:lips, buccal mucosa, and tongue normal and mucous membranes are moist  NECK: supple, trachea midline LYMPH:  not examined BREAST:not examined LUNGS: clear to auscultation  HEART: regular rate & rhythm, no murmurs, no gallops, S1 normal and S2 normal ABDOMEN:abdomen soft, non-tender, obese and normal bowel sounds BACK: Back symmetric, no curvature. EXTREMITIES:less then 2 second capillary refill, no joint deformities, effusion, or inflammation, no edema, no skin discoloration, no clubbing, no cyanosis  NEURO: alert & oriented x 3 with fluent speech, no focal motor/sensory deficits.  Residual weakness on left, but more improved.   LABORATORY DATA: CBC    Component Value Date/Time   WBC 5.2 03/08/2012 1135   RBC 3.69* 03/08/2012 1135   HGB 11.0* 03/08/2012 1135   HCT 34.6* 03/08/2012 1135   PLT 175 03/08/2012 1135   MCV 93.8 03/08/2012 1135   MCH 29.8 03/08/2012 1135   MCHC 31.8 03/08/2012 1135   RDW 16.4* 03/08/2012 1135   LYMPHSABS 1.1 03/08/2012 1135   MONOABS 0.8 03/08/2012 1135   EOSABS 1.1* 03/08/2012 1135   BASOSABS 0.0 03/08/2012 1135       ASSESSMENT: 1. 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 She is presently status post 6 cycles of pomalyst 21 days on 7  days off. She is in her 7 day break, presently. She is due to start cycle 7 on 03/18/2012. Her monoclonal spike is pending from 03/08/2012.   2. Hypertension  3. 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 4. 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   PLAN:  1. I personally reviewed and went over laboratory results with the patient. 2. Warm wash cloth with Laural Benes and Johnson baby shampoo for left eye.  3. Begin Pomalyst as scheduled on 03/18/2012 for cycle 7 after 7 day break.   4. Lab work in 4 weeks: CBC diff, CMET, MM panel 5. Return in 1 month for follow-up.  Continue with strength training as directed following stroke.    All questions were answered. The patient knows to call the clinic with any problems, questions or concerns. We can certainly see the patient much sooner if necessary.  The patient and plan discussed with Glenford Peers, MD and he is in agreement with the aforementioned.  [name]

## 2012-03-12 NOTE — Assessment & Plan Note (Signed)
Patient has been asymptomatic since undergoing percutaneous intervention nearly a decade ago for single vessel disease.  Therapy will continue to concentrate on optimal control of cardiovascular risk factors.

## 2012-03-13 LAB — MULTIPLE MYELOMA PANEL, SERUM
Alpha-2-Globulin: 13.5 % — ABNORMAL HIGH (ref 7.1–11.8)
Beta 2: 5.7 % (ref 3.2–6.5)
Beta Globulin: 5.2 % (ref 4.7–7.2)
Gamma Globulin: 15.5 % (ref 11.1–18.8)
IgA: 112 mg/dL (ref 69–380)
M-Spike, %: 0.33 g/dL

## 2012-03-14 ENCOUNTER — Encounter (HOSPITAL_COMMUNITY): Payer: PRIVATE HEALTH INSURANCE

## 2012-03-15 ENCOUNTER — Other Ambulatory Visit (HOSPITAL_COMMUNITY): Payer: Self-pay | Admitting: Oncology

## 2012-03-15 DIAGNOSIS — C9000 Multiple myeloma not having achieved remission: Secondary | ICD-10-CM

## 2012-03-15 MED ORDER — POMALIDOMIDE 4 MG PO CAPS: ORAL_CAPSULE | ORAL | Status: DC

## 2012-03-20 ENCOUNTER — Other Ambulatory Visit (HOSPITAL_COMMUNITY): Payer: Medicare Other

## 2012-04-01 ENCOUNTER — Inpatient Hospital Stay (HOSPITAL_COMMUNITY)
Admission: EM | Admit: 2012-04-01 | Discharge: 2012-04-03 | DRG: 690 | Disposition: A | Discharge status: Home / Self Care | Payer: PRIVATE HEALTH INSURANCE | Attending: Family Medicine | Admitting: Family Medicine

## 2012-04-01 ENCOUNTER — Emergency Department (HOSPITAL_COMMUNITY): Payer: PRIVATE HEALTH INSURANCE

## 2012-04-01 ENCOUNTER — Encounter (HOSPITAL_COMMUNITY): Payer: Self-pay | Admitting: Emergency Medicine

## 2012-04-01 DIAGNOSIS — Z981 Arthrodesis status: Secondary | ICD-10-CM

## 2012-04-01 DIAGNOSIS — N393 Stress incontinence (female) (male): Secondary | ICD-10-CM | POA: Diagnosis present

## 2012-04-01 DIAGNOSIS — M47812 Spondylosis without myelopathy or radiculopathy, cervical region: Secondary | ICD-10-CM | POA: Diagnosis present

## 2012-04-01 DIAGNOSIS — C9000 Multiple myeloma not having achieved remission: Secondary | ICD-10-CM

## 2012-04-01 DIAGNOSIS — Z85038 Personal history of other malignant neoplasm of large intestine: Secondary | ICD-10-CM

## 2012-04-01 DIAGNOSIS — Z8673 Personal history of transient ischemic attack (TIA), and cerebral infarction without residual deficits: Secondary | ICD-10-CM

## 2012-04-01 DIAGNOSIS — Z6839 Body mass index (BMI) 39.0-39.9, adult: Secondary | ICD-10-CM

## 2012-04-01 DIAGNOSIS — E876 Hypokalemia: Secondary | ICD-10-CM

## 2012-04-01 DIAGNOSIS — K625 Hemorrhage of anus and rectum: Secondary | ICD-10-CM

## 2012-04-01 DIAGNOSIS — N3281 Overactive bladder: Secondary | ICD-10-CM

## 2012-04-01 DIAGNOSIS — K5900 Constipation, unspecified: Secondary | ICD-10-CM | POA: Diagnosis present

## 2012-04-01 DIAGNOSIS — Z9221 Personal history of antineoplastic chemotherapy: Secondary | ICD-10-CM

## 2012-04-01 DIAGNOSIS — Z79899 Other long term (current) drug therapy: Secondary | ICD-10-CM

## 2012-04-01 DIAGNOSIS — Z9049 Acquired absence of other specified parts of digestive tract: Secondary | ICD-10-CM

## 2012-04-01 DIAGNOSIS — Z9861 Coronary angioplasty status: Secondary | ICD-10-CM

## 2012-04-01 DIAGNOSIS — Z7982 Long term (current) use of aspirin: Secondary | ICD-10-CM

## 2012-04-01 DIAGNOSIS — K3184 Gastroparesis: Secondary | ICD-10-CM | POA: Diagnosis present

## 2012-04-01 DIAGNOSIS — E785 Hyperlipidemia, unspecified: Secondary | ICD-10-CM | POA: Diagnosis present

## 2012-04-01 DIAGNOSIS — Z87891 Personal history of nicotine dependence: Secondary | ICD-10-CM

## 2012-04-01 DIAGNOSIS — K219 Gastro-esophageal reflux disease without esophagitis: Secondary | ICD-10-CM | POA: Diagnosis present

## 2012-04-01 DIAGNOSIS — I10 Essential (primary) hypertension: Secondary | ICD-10-CM

## 2012-04-01 DIAGNOSIS — N390 Urinary tract infection, site not specified: Principal | ICD-10-CM | POA: Diagnosis present

## 2012-04-01 DIAGNOSIS — Z23 Encounter for immunization: Secondary | ICD-10-CM

## 2012-04-01 DIAGNOSIS — Z90710 Acquired absence of both cervix and uterus: Secondary | ICD-10-CM

## 2012-04-01 DIAGNOSIS — E669 Obesity, unspecified: Secondary | ICD-10-CM | POA: Diagnosis present

## 2012-04-01 DIAGNOSIS — I2510 Atherosclerotic heart disease of native coronary artery without angina pectoris: Secondary | ICD-10-CM | POA: Diagnosis present

## 2012-04-01 DIAGNOSIS — D649 Anemia, unspecified: Secondary | ICD-10-CM

## 2012-04-01 LAB — CBC
HCT: 29.6 % — ABNORMAL LOW (ref 36.0–46.0)
MCH: 29.3 pg (ref 26.0–34.0)
MCV: 94.3 fL (ref 78.0–100.0)
Platelets: 196 10*3/uL (ref 150–400)
RDW: 16.5 % — ABNORMAL HIGH (ref 11.5–15.5)

## 2012-04-01 LAB — HEMOGLOBIN AND HEMATOCRIT, BLOOD
HCT: 33.8 % — ABNORMAL LOW (ref 36.0–46.0)
Hemoglobin: 9.2 g/dL — ABNORMAL LOW (ref 12.0–15.0)

## 2012-04-01 LAB — CBC WITH DIFFERENTIAL/PLATELET
Basophils Absolute: 0 10*3/uL (ref 0.0–0.1)
HCT: 33.3 % — ABNORMAL LOW (ref 36.0–46.0)
Hemoglobin: 10.4 g/dL — ABNORMAL LOW (ref 12.0–15.0)
Lymphocytes Relative: 20 % (ref 12–46)
Lymphs Abs: 1.1 10*3/uL (ref 0.7–4.0)
MCV: 94.1 fL (ref 78.0–100.0)
Monocytes Absolute: 0.8 10*3/uL (ref 0.1–1.0)
Monocytes Relative: 15 % — ABNORMAL HIGH (ref 3–12)
Neutro Abs: 3.1 10*3/uL (ref 1.7–7.7)
RBC: 3.54 MIL/uL — ABNORMAL LOW (ref 3.87–5.11)
RDW: 16.4 % — ABNORMAL HIGH (ref 11.5–15.5)
WBC: 5.2 10*3/uL (ref 4.0–10.5)

## 2012-04-01 LAB — AMMONIA: Ammonia: 39 umol/L (ref 11–60)

## 2012-04-01 LAB — TYPE AND SCREEN: Antibody Screen: NEGATIVE

## 2012-04-01 LAB — COMPREHENSIVE METABOLIC PANEL
AST: 13 U/L (ref 0–37)
CO2: 26 mEq/L (ref 19–32)
Chloride: 106 mEq/L (ref 96–112)
Creatinine, Ser: 1.25 mg/dL — ABNORMAL HIGH (ref 0.50–1.10)
GFR calc Af Amer: 49 mL/min — ABNORMAL LOW (ref >90)
GFR calc non Af Amer: 43 mL/min — ABNORMAL LOW (ref >90)
Glucose, Bld: 136 mg/dL — ABNORMAL HIGH (ref 70–99)
Total Bilirubin: 0.4 mg/dL (ref 0.3–1.2)

## 2012-04-01 LAB — OCCULT BLOOD X 1 CARD TO LAB, STOOL: Fecal Occult Bld: POSITIVE

## 2012-04-01 LAB — MAGNESIUM: Magnesium: 1.6 mg/dL (ref 1.5–2.5)

## 2012-04-01 MED ORDER — FUROSEMIDE 20 MG PO TABS: 20.0000 mg | ORAL_TABLET | ORAL | Status: DC | PRN

## 2012-04-01 MED ORDER — DEXAMETHASONE 4 MG PO TABS
20.0000 mg | ORAL_TABLET | ORAL | Status: DC
  Administered 2012-04-01: 20 mg via ORAL
  Filled 2012-04-01 (×2): qty 5

## 2012-04-01 MED ORDER — OXYCODONE-ACETAMINOPHEN 5-325 MG PO TABS: 1.0000 | ORAL_TABLET | Freq: Four times a day (QID) | ORAL | Status: DC | PRN

## 2012-04-01 MED ORDER — IRBESARTAN 75 MG PO TABS
75.0000 mg | ORAL_TABLET | Freq: Every day | ORAL | Status: DC
  Administered 2012-04-01 – 2012-04-03 (×3): 75 mg via ORAL
  Filled 2012-04-01 (×3): qty 1

## 2012-04-01 MED ORDER — PANTOPRAZOLE SODIUM 40 MG PO TBEC
40.0000 mg | DELAYED_RELEASE_TABLET | Freq: Every day | ORAL | Status: DC
  Administered 2012-04-01 – 2012-04-03 (×3): 40 mg via ORAL
  Filled 2012-04-01 (×3): qty 1

## 2012-04-01 MED ORDER — AMLODIPINE BESYLATE 5 MG PO TABS
5.0000 mg | ORAL_TABLET | Freq: Every day | ORAL | Status: DC
  Administered 2012-04-01 – 2012-04-03 (×3): 5 mg via ORAL
  Filled 2012-04-01 (×3): qty 1

## 2012-04-01 MED ORDER — SODIUM CHLORIDE 0.9 % IV BOLUS (SEPSIS)
1000.0000 mL | Freq: Once | INTRAVENOUS | Status: AC
  Administered 2012-04-01: 1000 mL via INTRAVENOUS

## 2012-04-01 MED ORDER — IOHEXOL 300 MG/ML  SOLN
100.0000 mL | Freq: Once | INTRAMUSCULAR | Status: AC | PRN
  Administered 2012-04-01: 100 mL via INTRAVENOUS

## 2012-04-01 MED ORDER — POTASSIUM CHLORIDE CRYS ER 20 MEQ PO TBCR
20.0000 meq | EXTENDED_RELEASE_TABLET | Freq: Once | ORAL | Status: AC
  Administered 2012-04-01: 20 meq via ORAL
  Filled 2012-04-01: qty 1

## 2012-04-01 MED ORDER — SODIUM CHLORIDE 0.45 % IV SOLN
INTRAVENOUS | Status: DC
  Administered 2012-04-01 – 2012-04-02 (×2): via INTRAVENOUS

## 2012-04-01 MED ORDER — INFLUENZA VIRUS VACC SPLIT PF IM SUSP
0.5000 mL | INTRAMUSCULAR | Status: AC
  Administered 2012-04-02: 0.5 mL via INTRAMUSCULAR
  Filled 2012-04-01: qty 0.5

## 2012-04-01 MED ORDER — POTASSIUM CHLORIDE 10 MEQ/100ML IV SOLN
10.0000 meq | INTRAVENOUS | Status: AC
  Administered 2012-04-01 (×4): 10 meq via INTRAVENOUS
  Filled 2012-04-01: qty 100
  Filled 2012-04-01: qty 400

## 2012-04-01 MED ORDER — PNEUMOCOCCAL VAC POLYVALENT 25 MCG/0.5ML IJ INJ
0.5000 mL | INJECTION | INTRAMUSCULAR | Status: AC
  Administered 2012-04-02: 0.5 mL via INTRAMUSCULAR
  Filled 2012-04-01: qty 0.5

## 2012-04-01 MED ORDER — NEBIVOLOL HCL 2.5 MG PO TABS
5.0000 mg | ORAL_TABLET | Freq: Every day | ORAL | Status: DC
  Administered 2012-04-01 – 2012-04-03 (×3): 5 mg via ORAL
  Filled 2012-04-01 (×4): qty 2

## 2012-04-01 MED ORDER — OXYBUTYNIN CHLORIDE 5 MG PO TABS
5.0000 mg | ORAL_TABLET | Freq: Every day | ORAL | Status: DC
  Administered 2012-04-01 – 2012-04-03 (×3): 5 mg via ORAL
  Filled 2012-04-01 (×3): qty 1

## 2012-04-01 NOTE — ED Notes (Signed)
Breakfast tray given. °

## 2012-04-01 NOTE — H&P (Signed)
[id number]  

## 2012-04-01 NOTE — ED Provider Notes (Signed)
History     CSN: [id number]  Arrival date & time 04/01/12  3664   First MD Initiated Contact with Patient 04/01/12 [phone]      Chief Complaint  Patient presents with  . Rectal Bleeding    (Consider location/radiation/quality/duration/timing/severity/associated sxs/prior treatment) Patient is a 71 y.o. female presenting with hematochezia. The history is provided by the patient (the pt complains of rectal bleeding times 4 today). No language interpreter was used.  Rectal Bleeding  The current episode started today. The problem occurs frequently. The problem has been resolved. The pain is moderate. The stool is described as bloody. There was no prior successful therapy. There was no prior unsuccessful therapy. Pertinent negatives include no abdominal pain, no diarrhea, no hematuria, no chest pain, no headaches, no coughing and no rash.    Past Medical History  Diagnosis Date  . Hypertension   . Arteriosclerotic cardiovascular disease (ASCVD)     Former patient of [facility] - [facility]CA apical LAD 2005  . Borderline diabetes     + proteinuria  . GERD (gastroesophageal reflux disease)     + constipation, gastroparesis  . Hyperlipidemia   . Female stress incontinence     H/o urinary tract infection  . Vertigo   . Insomnia   . Colon cancer     1975  . Multiple myeloma(203.0)   . Osteoarthritis     cervical spine; status post surgery  . CVA (cerebral infarction) 01/2012    Past Surgical History  Procedure Date  . Partial colectomy 1995     [facility], colon cancer  . Abdominal hysterectomy 1970s    [facility]; secondary to metromenorrhagia  . Meniscus repair 2003    Arthroscopic, left,  [facility]  . Heel spur surgery     [facility], [facility]  . Cervical spine surgery 2004    [facility]  . Port-a-cath removal 11/05/2010    Procedure: REMOVAL PORT-A-CATH;  Surgeon: Dalia Heading;  Location: [facility];  Service: General;  Laterality: Left;  started at 0846  . Portacath placement 11/05/2010    Procedure: INSERTION  PORT-A-CATH;  Surgeon: Dalia Heading;  Location: [facility];  Service: General;  Laterality: Right;  Right subclavian, device left accessed   . Colonoscopy  07/02/2007    Normal rectum status post prior sigmoid resection with normal anastomosis identified at 35 cm.  Normal residual colon.Marland Kitchen Next TCS 06/2012  . Esophagogastroduodenoscopy   08/22/2007    Small hiatal hernia/A circular, purplish, 1-cm, distal esophageal submucosal lesion of uncertain significance, otherwise normal esophagus. EUS -esophageal bleb (aberrant blood vessel). retained gastric contents/food  . Appendectomy 1953    Family History  Problem Relation Age of Onset  . Anesthesia problems Neg Hx   . Hypotension Neg Hx   . Malignant hyperthermia Neg Hx   . Pseudochol deficiency Neg Hx   . Lung disease Mother   . Heart disease Father   . Heart disease Brother   . Hypertension Brother   . Arthritis Daughter   . Seizures Daughter   . Multiple sclerosis Son   . Colon cancer Brother 10    History  Substance Use Topics  . Smoking status: Former Smoker -- 0.2 packs/day for 15 years    Types: Cigarettes    Quit date: 11/03/1990  . Smokeless tobacco: Never Used  . Alcohol Use: No    OB History    Grav Para Term Preterm Abortions TAB SAB Ect Mult Living  Review of Systems  Constitutional: Negative for fatigue.  HENT: Negative for congestion, sinus pressure and ear discharge.   Eyes: Negative for discharge.  Respiratory: Negative for cough.   Cardiovascular: Negative for chest pain.  Gastrointestinal: Positive for hematochezia. Negative for abdominal pain and diarrhea.       Rectal bleeding  Genitourinary: Negative for frequency and hematuria.  Musculoskeletal: Negative for back pain.  Skin: Negative for rash.  Neurological: Negative for seizures and headaches.  Hematological: Negative.   Psychiatric/Behavioral: Negative for hallucinations.    Allergies  Gabapentin and Simvastatin  Home  Medications   Current Outpatient Rx  Name  Route  Sig  Dispense  Refill  . AMLODIPINE BESYLATE 5 MG PO TABS   Oral   Take 1 tablet (5 mg total) by mouth daily.   30 tablet   2   . ASPIRIN 81 MG PO TABS   Oral   Take 81 mg by mouth daily.         . DULCOLAX PO   Oral   Take 2 tablets by mouth daily as needed. Constipation         . DEXAMETHASONE 4 MG PO TABS   Oral   Take 20 mg by mouth once a week. Wednesday         . DIOVAN 160 MG PO TABS   Oral   Take 1 tablet by mouth 2 (two) times daily.         Marland Kitchen DIPHENHYDRAMINE-APAP (SLEEP) 25-500 MG PO TABS   Oral   Take 1 tablet by mouth at bedtime as needed. Pain/Sleep         . FUROSEMIDE 20 MG PO TABS   Oral   Take 20 mg by mouth as needed. Fluid Retention         . NEBIVOLOL HCL 5 MG PO TABS   Oral   Take 2.5-10 mg by mouth 2 (two) times daily. Take 10 mg in the am and take 2.5 mg in the pm by mouth daily.         Marland Kitchen OMEPRAZOLE 20 MG PO CPDR   Oral   Take 20 mg by mouth 2 (two) times daily.          . OXYBUTYNIN CHLORIDE 5 MG PO TABS   Oral   Take 1 tablet (5 mg total) by mouth daily.   30 tablet   3   . OXYCODONE-ACETAMINOPHEN 5-325 MG PO TABS   Oral   Take 1 tablet by mouth every 6 (six) hours as needed. pain   60 tablet   0   . POMALIDOMIDE 4 MG PO CAPS      Take 1 tablet, PO daily x 21 days with 7 day respite (starting on 03/18/2012)   21 capsule   1   . POTASSIUM CHLORIDE CRYS ER 20 MEQ PO TBCR      Take 1 tablet BID x 14 days, then 1 daily   45 tablet   1     Please deliver to patient   . ROSUVASTATIN CALCIUM 20 MG PO TABS   Oral   Take 20 mg by mouth at bedtime.             BP 102/68  Pulse 70  Temp 98.4 F (36.9 C) (Oral)  Resp 20  Ht 5\' 5"  (1.651 m)  Wt 235 lb (106.595 kg)  BMI 39.11 kg/m2  SpO2 94%  Physical Exam  Constitutional: She is oriented to person, place, and  time. She appears well-developed.  HENT:  Head: Normocephalic and atraumatic.  Eyes:  Conjunctivae normal and EOM are normal. No scleral icterus.  Neck: Neck supple. No thyromegaly present.  Cardiovascular: Normal rate and regular rhythm.  Exam reveals no gallop and no friction rub.   No murmur heard. Pulmonary/Chest: No stridor. She has no wheezes. She has no rales. She exhibits no tenderness.  Abdominal: She exhibits no distension. There is tenderness. There is no rebound.  Genitourinary:       Hem neg stool  Musculoskeletal: Normal range of motion. She exhibits no edema.  Lymphadenopathy:    She has no cervical adenopathy.  Neurological: She is oriented to person, place, and time. Coordination normal.  Skin: No rash noted. No erythema.  Psychiatric: She has a normal mood and affect. Her behavior is normal.    ED Course  Procedures (including critical care time)  Labs Reviewed  CBC WITH DIFFERENTIAL - Abnormal; Notable for the following:    RBC 3.54 (*)     Hemoglobin 10.4 (*)     HCT 33.3 (*)     RDW 16.4 (*)     Monocytes Relative 15 (*)     All other components within normal limits  COMPREHENSIVE METABOLIC PANEL - Abnormal; Notable for the following:    Potassium 3.4 (*)     Glucose, Bld 136 (*)     Creatinine, Ser 1.25 (*)     GFR calc non Af Amer 43 (*)     GFR calc Af Amer 49 (*)     All other components within normal limits  TYPE AND SCREEN   Ct Abdomen Pelvis W Contrast  04/01/2012  *RADIOLOGY REPORT*  Clinical Data: Rectal bleeding.  CT ABDOMEN AND PELVIS WITH CONTRAST  Technique:  Multidetector CT imaging of the abdomen and pelvis was performed following the standard protocol during bolus administration of intravenous contrast.  Contrast: OMNIPAQUE IOHEXOL 300 MG/ML  SOLN  Comparison: CT 07/27/2009  Findings: Heart is upper limits normal in size.  Lung bases are clear.  No effusions.  Gallbladder is contracted.  Stomach grossly unremarkable.  Liver, spleen, pancreas, left adrenal and right kidney are unremarkable. Low density area in the mid pole  of the left kidney is better seen on delayed images and is enlarged since prior study, measuring 2.0 cm maximally compared with a 11 mm previously.  This measures water density on both portal venous and delayed imaging and most likely represents a cyst.  No hydronephrosis.  Postoperative changes in the left lower quadrant from partial colectomy. Colon otherwise grossly unremarkable.  Small bowel is decompressed.  Prior hysterectomy.  No adnexal masses.  No free fluid.  No free air or adenopathy.  No acute bony abnormality.  Degenerative disc disease and facet disease in the lower lumbar spine.  IMPRESSION: No acute findings in the abdomen or pelvis.   Original Report Authenticated By: Charlett Nose, M.D.      1. Rectal bleeding       MDM          [name], MD 04/01/12 [phone]

## 2012-04-01 NOTE — ED Notes (Addendum)
edp in with pt, hemoccult negative

## 2012-04-01 NOTE — ED Notes (Signed)
States that approximately 1 hour ago she had a loose bowel movement that was bright red blood, states that she does feel somewhat weak.

## 2012-04-01 NOTE — Consult Note (Signed)
Patient interviewed and examined. Patient seen for rectal bleeding I believe she had a vaginal bleed. Dr. Estell Harpin in emergency room performed rectal exam and there was no blood in the rectum and was guaiac negative. Patient has had EGD and colonoscopy by Dr. Jena Gauss on  01/09/2012. He removed 2 small tubular adenomas. Colonic anastomosis was unremarkable. Recommendations; Will check CBC in a.m. Consider consultation with gynecologist.

## 2012-04-01 NOTE — ED Notes (Signed)
Pt back from ct

## 2012-04-01 NOTE — ED Notes (Signed)
Attempt to call report - RN Trula Ore in with pt, gowned and gloved; she will call back when she is finished.

## 2012-04-02 DIAGNOSIS — N898 Other specified noninflammatory disorders of vagina: Secondary | ICD-10-CM

## 2012-04-02 DIAGNOSIS — D649 Anemia, unspecified: Secondary | ICD-10-CM

## 2012-04-02 LAB — CBC
MCH: 29.8 pg (ref 26.0–34.0)
MCHC: 31.6 g/dL (ref 30.0–36.0)
MCV: 94 fL (ref 78.0–100.0)
Platelets: 205 10*3/uL (ref 150–400)
RBC: 3.36 MIL/uL — ABNORMAL LOW (ref 3.87–5.11)
RDW: 16.1 % — ABNORMAL HIGH (ref 11.5–15.5)

## 2012-04-02 LAB — HEPATIC FUNCTION PANEL
ALT: 9 U/L (ref 0–35)
Alkaline Phosphatase: 45 U/L (ref 39–117)
Bilirubin, Direct: 0.1 mg/dL (ref 0.0–0.3)
Indirect Bilirubin: 0.3 mg/dL (ref 0.3–0.9)
Total Protein: 6.7 g/dL (ref 6.0–8.3)

## 2012-04-02 LAB — BASIC METABOLIC PANEL
BUN: 17 mg/dL (ref 6–23)
CO2: 24 mEq/L (ref 19–32)
Calcium: 9 mg/dL (ref 8.4–10.5)
Chloride: 106 mEq/L (ref 96–112)
Creatinine, Ser: 1.32 mg/dL — ABNORMAL HIGH (ref 0.50–1.10)
GFR calc Af Amer: 46 mL/min — ABNORMAL LOW (ref >90)

## 2012-04-02 LAB — PROTIME-INR: Prothrombin Time: 32.7 seconds — ABNORMAL HIGH (ref 11.6–15.2)

## 2012-04-02 MED ORDER — CIPROFLOXACIN HCL 250 MG PO TABS
500.0000 mg | ORAL_TABLET | Freq: Two times a day (BID) | ORAL | Status: DC
  Administered 2012-04-02 – 2012-04-03 (×3): 500 mg via ORAL
  Filled 2012-04-02 (×3): qty 2

## 2012-04-02 MED ORDER — SODIUM CHLORIDE 0.9 % IJ SOLN: 10.0000 mL | INTRAMUSCULAR | Status: DC | PRN

## 2012-04-02 MED ORDER — SODIUM CHLORIDE 0.9 % IJ SOLN: 10.0000 mL | Freq: Two times a day (BID) | INTRAMUSCULAR | Status: DC

## 2012-04-02 NOTE — Progress Notes (Signed)
Subjective: Pt denies any further bleeding today.  Denies abdominal pain, nausea, vomiting, diarrhea.  Objective: Vital signs in last 24 hours: Temp:  [97.7 F (36.5 C)-98.6 F (37 C)] 98.6 F (37 C) (12/09 0654) Pulse Rate:  [55-66] 55  (12/09 0654) Resp:  [16-20] 20  (12/09 0654) BP: (140-175)/(56-83) 175/76 mmHg (12/09 0654) SpO2:  [98 %-100 %] 98 % (12/09 0654) Weight:  [234 lb 12.6 oz (106.5 kg)] 234 lb 12.6 oz (106.5 kg) (12/08 0922) Last BM Date: 04/01/12 No LMP recorded. Patient has had a hysterectomy. Body mass index is 39.07 kg/(m^2). General:   Alert,  obese, pleasant and cooperative in NAD Head:  Normocephalic Eyes:  Sclera clear, no icterus.   Conjunctiva pink. Mouth:  No deformity or lesions, oropharynx pink & moist. Heart:  Regular rate and rhythm; no murmurs, clicks, rubs,  or gallops. Abdomen:   Obese.  Normal bowel sounds.  Soft, nontender and nondistended. No masses, hepatosplenomegaly or hernias noted.  No guarding or rebound tenderness.   Msk:  Symmetrical Pulses:  Normal pulses noted. Extremities:  Trace pedal edema bilat. Neurologic:  Alert and  oriented x4;  grossly normal neurologically. Skin:  Intact without significant lesions or rashes. Cervical Nodes:  No significant cervical adenopathy. Psych:  Alert and cooperative. Normal mood and affect.  Intake/Output from previous day: 12/08 0701 - 12/09 0700 In: 1394.4 [P.O.:240; I.V.:854.4; IV Piggyback:300] Out: -   Lab Results:  Basename 04/02/12 0209 04/01/12 1711 04/01/12 1100 04/01/12 0246  WBC 5.2 -- 3.9* 5.2  HGB 10.0* 10.7* 9.2*9.2* --  HCT 31.6* 33.8* 29.5*29.6* --  PLT 205 -- 196 206   BMET  Basename 04/02/12 0209 04/01/12 0246  NA 137 141  K 5.1 3.4*  CL 106 106  CO2 24 26  GLUCOSE 244* 136*  BUN 17 14  CREATININE 1.32* 1.25*  CALCIUM 9.0 9.5   LFT  Basename 04/02/12 0209 04/01/12 0246  PROT 6.7 6.7  ALBUMIN 3.4* 3.6  AST 8 13  ALT 9 10  ALKPHOS 45 40  BILITOT 0.4 0.4   BILIDIR 0.1 --  IBILI 0.3 --  LIPASE -- --  AMYLASE -- --   PT/INR  Basename 04/02/12 0209  LABPROT 32.7*  INR 3.44*   Studies/Results: Ct Abdomen Pelvis W Contrast  04/01/2012  *RADIOLOGY REPORT*  Clinical Data: Rectal bleeding.  CT ABDOMEN AND PELVIS WITH CONTRAST  Technique:  Multidetector CT imaging of the abdomen and pelvis was performed following the standard protocol during bolus administration of intravenous contrast.  Contrast: OMNIPAQUE IOHEXOL 300 MG/ML  SOLN  Comparison: CT 07/27/2009  Findings: Heart is upper limits normal in size.  Lung bases are clear.  No effusions.  Gallbladder is contracted.  Stomach grossly unremarkable.  Liver, spleen, pancreas, left adrenal and right kidney are unremarkable. Low density area in the mid pole of the left kidney is better seen on delayed images and is enlarged since prior study, measuring 2.0 cm maximally compared with a 11 mm previously.  This measures water density on both portal venous and delayed imaging and most likely represents a cyst.  No hydronephrosis.  Postoperative changes in the left lower quadrant from partial colectomy. Colon otherwise grossly unremarkable.  Small bowel is decompressed.  Prior hysterectomy.  No adnexal masses.  No free fluid.  No free air or adenopathy.  No acute bony abnormality.  Degenerative disc disease and facet disease in the lower lumbar spine.  IMPRESSION: No acute findings in the abdomen or pelvis.  Original Report Authenticated By: Charlett Nose, M.D.     Assessment: 1. Anemia:  Hgb stable 2. ? Bleeding: GI vs GU.  Pt notes no further bleeding.  There was concern as to vaginal bleeding per Dr Patty Sermons assessment & pt was hemoccult negative in ED. 3. Coagulopathy:  INR 3.44 ?etiology ?chemo, no known hx underlying liver disease  Plan: 1. Consider GYN eval 2. Continue to monitor H/H  3. Supportive measures 4. Colonoscopy up to date  LOS: 1 day   [name]  04/02/2012, 8:24 AM

## 2012-04-02 NOTE — Progress Notes (Signed)
Inpatient Diabetes Program Recommendations  AACE/ADA: New Consensus Statement on Inpatient Glycemic Control (2013)  Target Ranges:  Prepandial:   less than 140 mg/dL      Peak postprandial:   less than 180 mg/dL (1-2 hours)      Critically ill patients:  140 - 180 mg/dL   Results for ELLASON, SEGAR (MRN [id number]) as of 04/02/2012 09:03  Ref. Range 04/01/2012 02:46 04/02/2012 02:09  Glucose Latest Range: 70-99 mg/dL 409 (H) 811 (H)  Results for CATY, TESSLER (MRN [id number]) as of 04/02/2012 09:03  Ref. Range 02/07/2012 15:00  Hemoglobin A1C Latest Range: <5.7 % 6.1 (H)    Inpatient Diabetes Program Recommendations Correction (SSI): Please consider checking blood glucose ACHS and ordering Novolog correction sensitive scale since fasting CBG this am was 244 mg/dl.   Note: Patient has no documented history of diabetes.  Fasting lab glucose this am was 244 mg/dl.  Please consider ordering CBGs ACHS with Novolog correction sensitive scale while patient is an inpatient.  Will continue to follow.  Thanks, Orlando Penner, RN, BSN, CCRN Diabetes Coordinator Inpatient Diabetes Program [phone]

## 2012-04-02 NOTE — Progress Notes (Signed)
UR Chart Review Completed  

## 2012-04-02 NOTE — Consult Note (Signed)
[name], [name]                 ACCOUNT NO.:  [id number]  MEDICAL RECORD NO.:  [id number]  LOCATION:  A201                          FACILITY:  [facility]  PHYSICIAN:  Lionel December, M.D.    DATE OF BIRTH:  06-24-1940  DATE OF CONSULTATION:  04/01/2012 DATE OF DISCHARGE:                                CONSULTATION   REASON FOR CONSULTATION:  Rectal bleeding.  HISTORY OF PRESENT ILLNESS:  The patient is a 71 year old African- American female, who was admitted to Dr. Otilio Saber service via emergency room where she presented earlier today with complaints of rectal bleeding.  She was apparently brought to the ER by EMS.  The patient has been receiving chemotherapy for multiple myeloma.  She reports she has had few loose stools over the last few days.  She also noted specks of blood on her pad; however, this morning, she passed perhaps a cupful of fresh blood per rectum.  She felt weak and somewhat lightheaded.  She was brought to emergency room.  She was evaluated by Dr. Delsa Grana who noted no blood in the rectum on digital exam and her stool was guaiac negative.  The patient was admitted to the floor.  She had another bowel movement that she did not see any blood.  She denies vaginal discharge or bleeding.  She is not sure the last time she had vaginal or pelvic exam.  The patient reports that she has very good appetite.  She has not lost any weight recently.  She states she has been under lot of stress because she has to care for her brother who has coronary artery disease.  Her son who has MS also lives with her.  She denies nausea, vomiting, heartburn, or abdominal pain.  HOME MEDICATIONS: 1. Aspirin 81 mg p.o. daily. 2. Dulcolax 5 mg p.o. daily p.r.n. 3. Vitamin D 1000 units p.o. daily. 4. Furosemide 20 mg p.o. daily p.r.n. 5. KCl 20 mEq p.o. daily. 6. Amlodipine 5 mg p.o. daily. 7. Diovan 160 mg p.o. b.i.d. 8. Nebivolol 10 mg in a.m. and 2.5 mg in p.m. 9. Omeprazole 20 mg p.o.  b.i.d. 10.Oxybutynin 5 mg p.o. daily. 11.Percocet 5/325 p.o. q.6 h. p.r.n. 12.Pomalidomide 4 mg daily for 21 days followed by one week off and     then repeat cycle. 13.Rosuvastatin 20 mg p.o. daily. 14.Dexamethasone 20 mg p.o. q. weekly.  CURRENT MEDICATIONS: 1. Amlodipine 5 mg p.o. daily. 2. Dexamethasone 20 mg p.o. q. Wednesday. 3. Irbesartan 75 mg p.o. daily. 4. Nebivolol 5 mg p.o. daily. 5. Oxybutynin 5 mg p.o. daily. 6. Pantoprazole 40 mg p.o. q.a.m.  P.r.n. medications include furosemide and Percocet.  PAST MEDICAL HISTORY: 1. She has chronic hypertension. 2. She has coronary artery disease.  She had angioplasty to LAD in     June 2005. 3. 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  She developed minimal     left-sided weakness. 4. History of colon carcinoma.  She had low anterior resection in     1995.  She has had periodic surveillance colonoscopy.  Her last  exam was in September 2013 by Dr. Jena Gauss and she had 2 small tubular     adenomas removed. 5. Chronic GERD.  She had EGD in 2009 by Dr. Jena Gauss and there was     question of a nodule at distal esophagus.  She had EUS in May 2009     by Dr. Wendall Papa and this was a blood vessel.  She had EGD in     September 2013 that revealed small sliding hiatal hernia. 6. She has multiple myeloma and is under care of Dr. Mariel Sleet and     associates.  She is presently on therapy as above.  She previously     has been on systemic therapy and she has had a Port-A-Cath. 7. Hyperlipidemia. 8. She had an appendectomy at age 41. 47. She had hysterectomy 42 years ago with removal of 1 ovary. 10.She had left knee arthroscopy in July 2003. 11.Osteoarthrosis. 12.She had neck surgery in December 2007 for spinal stenosis at C3-C4     and C4-C5.  She had diskectomy and fusion with bone graft.  ALLERGIES:  Gabapentin and simvastatin.  FAMILY HISTORY:  Both parents are  hypertensive.  Father died at age 55 which she does not remember the cause.  Mother died of CVA at age 56. She has a brother aged 72 with coronary artery disease and has undergone CABG.  SOCIAL HISTORY:  She is divorced.  She is retired from [employer] where she worked for 34 years until 2005.  She has 4 children, and 3 of them have health issues, 1 son aged 45 has MS, daughter has epilepsy and she is 61.  Other daughter is 30 and the patient believes she has rheumatoid arthritis.  One son aged 20 is in good health.  She does not smoke cigarettes or drink alcohol.  PHYSICAL EXAMINATION:  GENERAL:  Pleasant, well-developed, mildly obese African-American female, who is in no acute distress. VITAL SIGNS:  She weighs 234 pounds and 12 ounces.  She is 65 inches tall.  Pulse 62 per minute, blood pressure 150/83, respirations 20, and temp is 98.5. HEENT:  Conjunctivae is pink.  Sclera is nonicteric.  Oropharyngeal mucosa is normal.  She has upper dentures in place and her dentures in lower jaw.  No neck masses or thyromegaly noted. CARDIAC:  Regular rhythm.  Normal S1, S2.  No murmur or gallop noted. LUNGS:  Clear to auscultation. ABDOMEN:  Full bowel sounds are normal.  On palpation, soft abdomen. Mild midepigastric tenderness.  No organomegaly or masses noted. RECTAL:  Deferred.  No peripheral edema or clubbing noted.  LABORATORY DATA:  From admission, WBC 5.2, H and H 10.4 and 33.3, platelet count 206,000.  Sodium 141, potassium 3.4, chloride 106, CO2 26, glucose 136, BUN 14, creatinine 1.25, serum calcium 9.5, bilirubin 0.4, AP 40, AST 13, ALT 10, total protein 6.7 with albumin of 3.6. Repeat H and H from 11:00 a.m. today 9.2 and 29.6.  Abdominopelvic CT with contrast reveals no evidence of bowel distention, adenopathy, or other abnormalities.  She had possibly a small cyst in left kidney.  Postop changes in left lower quadrant from prior colon resection evidence of  hysterectomy.  ASSESSMENT:  The patient is a 71 year old African-American female with multiple medical problems, who is undergoing therapy for multiple myeloma, who presents with complaints of bright red blood per rectum. However, on rectal exam by Dr. Delsa Grana in the emergency room, there was no blood in the rectum and stool was guaiac negative.  She also has noted spotting on her pad over the last few days, but I suspect she had vaginal bleeding.  It is worth mentioning that she just had colonoscopy by Dr. Jena Gauss less than 3 months ago with removal of 2 small tubular adenoma, but no other abnormalities were noted.  She also had EGD at that time.  She therefore does not need any further GI workup unless she has unequivocal evidence of GI bleed.  She has mild anemia.  Her baseline H and H has been low possibly related to chemo for multiple myeloma.  She has history of sigmoid colon resection in 1995 and remains in remission and just had surveillance colonoscopy by Dr. Jena Gauss as above.  RECOMMENDATIONS: 1. We will monitor H and H. 2. Gynecologic consultation, but I would leave this up to Dr.     Janna Arch. 3. We will also check INR. 4. Dr. Jena Gauss will be seeing patient starting on 04/02/2012 for GI issues.  We appreciate the opportunity to participate in the care of this nice lady.          ______________________________ Lionel December, M.D.     NR/MEDQ  D:  04/01/2012  T:  04/02/2012  Job:  [id number]

## 2012-04-02 NOTE — Progress Notes (Signed)
[name], Ray NO.:  [id number]  MEDICAL RECORD NO.:  [id number]  LOCATION:  A201                          FACILITY:  [facility]  PHYSICIAN:  Melvyn Novas, MDDATE OF BIRTH:  [birth year]  DATE OF PROCEDURE: DATE OF DISCHARGE:                                PROGRESS NOTE   The patient had questionable GI, GU bleeding.  She has status post hysterectomy, status post sigmoid colectomy in 2005.  She had recent colonoscopy.  She had no further bleeding.  Hemoglobin was stable at 9.6.  VITAL SIGNS:  Blood pressure 175/76, pulse is 55 and regular, temperature 98.6, O2 saturation 98%. LUNGS:  Clear to A and P.  No rales, wheeze, or rhonchi. HEART:  Regular rate and rhythm.  No murmurs, gallops, or rubs. ABDOMEN:  Soft, nontender.  Bowel sounds normoactive.  Since she has had a vaginal hysterectomy, I will like to treat with Cipro 500 b.i.d. for empiric UTI, hopefully this will abate symptomatology for 5 days, and if not, we will pursue further investigative workup with GYN if symptomatology continues.     Melvyn Novas, MD     RMD/MEDQ  D:  04/02/2012  T:  04/02/2012  Job:  [id number]

## 2012-04-02 NOTE — Progress Notes (Signed)
[id number] 

## 2012-04-02 NOTE — H&P (Signed)
NAME:  [name], [name]                 ACCOUNT NO.:  [id number]  MEDICAL RECORD NO.:  [id number]  LOCATION:  A201                          FACILITY:  [facility]  PHYSICIAN:  Melvyn Novas, MDDATE OF BIRTH:  11-18-1940  DATE OF ADMISSION:  04/01/2012 DATE OF DISCHARGE:  LH                             HISTORY & PHYSICAL   HISTORY OF PRESENT ILLNESS:  The patient is a 71 year old black female who is an extremely poor historian, who had a history of colon carcinoma status post sigmoid resection in 1975 as well as having current multiple myeloma taking Procrit injections every month for chronic anemia.  The patient relates that she had a colonoscopy in 2009 revealing normal rectum and normal anastomosis of sigmoid resection at 35 cm.  She has chronic stress urinary incontinence and states according to her she had right red blood from her rectum.  She is likewise status post hysterectomy.  Over the preceding day, she states this occurred mostly with bowel movements.  She denied painful bowel movements, melena, hematemesis.  She denied abdominal pain, and according to ER charts the rectal was heme negative, I am not certain.  She was placed on clear liquids here admitted with hemoglobin of 10.4 and potassium 3.4.  She will be admitted to observe for serial hemoglobin/hematocrit, clinical course, clear liquid diet, and consideration of visualization if GI deems clinically necessary.  PAST MEDICAL HISTORY:  Significant for hypertension, stress urinary incontinence, glucose intolerance, GERD, gastroparesis, hyperlipidemia, history of vertigo, history of colon cancer in 1975, current multiple myeloma, osteoarthritis, and question of CVA.  PAST SURGICAL HISTORY:  Remarkable for partial sigmoid colectomy in 1995, abdominal hysterectomy in 1970s, meniscus repair of the left knee in 2003, cervical laminectomy in 2004, Port-A-Cath removal in 2012.  EGD showing hiatal hernia.  Esophageal web in  2009 and appendectomy 1953.  ALLERGIES:  She has no known allergies.  SOCIAL HISTORY:  She smoked 5 cigarettes a day for 15 years.  She does not imbibe alcohol.  She lives with her brother who she cares for.  CURRENT MEDICINES: 1. Norvasc 5 mg p.o. daily. 2. Aspirin 81 mg p.o. daily. 3. Dulcolax p.r.n. 4. Dexamethasone 20 mg once a week. 5. Diovan 160 p.o. daily. 6. Lasix 20 mg p.o. daily. 7. Nebivolol 5 mg p.o. daily. 8. Prilosec 20 mg p.o. daily. 9. Oxybutynin hydrochloride 5 mg daily. 10.Oxycodone 1 tablets q.6h. p.r.n. 11.Potassium chloride 20 mEq 1 tablet p.o. daily. 12.Crestor 20 mg p.o. daily.  PHYSICAL EXAMINATION:  VITAL SIGNS:  Blood pressure is 102/68, temperature 98.4, respiratory rate is 20, O2 saturation 94%. HEENT:  Eyes PERRLA intact.  Sclerae clear.  Conjunctivae pink. NECK:  No JVD.  No carotid bruits.  No thyromegaly.  No thyroid bruits. LUNGS:  Prolonged expiratory phase.  Diminished breath sounds at bases. No rales, wheeze, or rhonchi appreciable. HEART:  Regular rhythm.  No murmurs, gallops, heaves, thrills, or rubs. ABDOMEN:  Soft, nontender.  Bowel sounds normoactive.  No peristaltic rush is noted.  No guarding or rebound.  No masses.  No megaly.  Rectal was negative in the ER. EXTREMITIES :  Trace to 1+ pedal edema. NEUROLOGIC:  Cranial nerves  II through XII grossly intact.  The patient moves all extremities.  Plantars are downgoing.  PROBLEMS: 1. Bright red blood per rectum.  According to patient, heme negative     rectal exam in the ER of 1 day's duration. 2. Prior sigmoid colectomy for colon carcinoma in 1995. 3. Hypertension. 4. Degenerative joint disease on narcotic analgesia p.r.n. 5. Hypokalemia.  Potassium 3.4. 6. Hypertension. 7. Anemia.  Hemoglobin 10.4.  PLAN:  Right now is to give her clear liquid diet.  Serial hemoglobin and hematocrit every 8 hours.  GI consultation is pending.  Monitor electrolytes and magnesium daily.  Hepatic  profile today.  We will give sequential compression stockings as prophylaxis for DVT and potassium chloride 10 mEq x4 runs.     Melvyn Novas, MD     RMD/MEDQ  D:  04/01/2012  T:  04/02/2012  Job:  [id number]

## 2012-04-02 NOTE — Care Management Note (Signed)
    Page 1 of 2   04/03/2012     9:02:09 AM   CARE MANAGEMENT NOTE 04/03/2012  Patient:  [name], [name]   Account Number:  [id number]  Date Initiated:  04/02/2012  Documentation initiated by:  Sharrie Rothman  Subjective/Objective Assessment:   Pt admitted from home with rectal bleeding. Pt lives with her son and brother and will return home at discharge. Pt uses [facility] for transportation to appts. Pt is independent with ADL's.     Action/Plan:   Pt would like BSC at discharge. No HH needs noted. Will continue to monitor.   Anticipated DC Date:  04/04/2012   Anticipated DC Plan:  HOME/SELF CARE      DC Planning Services  CM consult      PAC Choice  DURABLE MEDICAL EQUIPMENT   Choice offered to / List presented to:  C-1 Patient   DME arranged  BEDSIDE COMMODE      DME agency  [facility]        Status of service:  Completed, signed off Medicare Important Message given?  NA - LOS <3 / Initial given by admissions (If response is "NO", the following Medicare IM given date fields will be blank) Date Medicare IM given:  04/03/2012 Date Additional Medicare IM given:    Discharge Disposition:  HOME/SELF CARE  Per UR Regulation:    If discussed at Long Length of Stay Meetings, dates discussed:    Comments:  04/03/12 0900 Arlyss Queen, RN BSN CM Pt discharged home today. BSC ordered from [place] and will be delivered to pts home later today. Pt and pts nurse aware of discharge arrangements.  04/02/12 1124 Arlyss Queen, RN BSN CM

## 2012-04-03 ENCOUNTER — Telehealth: Payer: Self-pay | Admitting: Urgent Care

## 2012-04-03 LAB — BASIC METABOLIC PANEL
CO2: 25 mEq/L (ref 19–32)
Chloride: 108 mEq/L (ref 96–112)
Creatinine, Ser: 1.09 mg/dL (ref 0.50–1.10)
GFR calc Af Amer: 58 mL/min — ABNORMAL LOW (ref >90)
Potassium: 4.5 mEq/L (ref 3.5–5.1)
Sodium: 140 mEq/L (ref 135–145)

## 2012-04-03 LAB — HEPATIC FUNCTION PANEL
ALT: 8 U/L (ref 0–35)
AST: 8 U/L (ref 0–37)
Albumin: 3 g/dL — ABNORMAL LOW (ref 3.5–5.2)
Alkaline Phosphatase: 33 U/L — ABNORMAL LOW (ref 39–117)
Total Protein: 6 g/dL (ref 6.0–8.3)

## 2012-04-03 LAB — HEMOGLOBIN AND HEMATOCRIT, BLOOD
HCT: 28.3 % — ABNORMAL LOW (ref 36.0–46.0)
Hemoglobin: 8.9 g/dL — ABNORMAL LOW (ref 12.0–15.0)

## 2012-04-03 MED ORDER — CIPROFLOXACIN HCL 500 MG PO TABS: 500.0000 mg | ORAL_TABLET | Freq: Two times a day (BID) | ORAL | Status: DC

## 2012-04-03 MED ORDER — FUROSEMIDE 20 MG PO TABS: 20.0000 mg | ORAL_TABLET | ORAL | Status: DC | PRN

## 2012-04-03 MED ORDER — HEPARIN SOD (PORK) LOCK FLUSH 100 UNIT/ML IV SOLN
500.0000 [IU] | INTRAVENOUS | Status: AC | PRN
  Administered 2012-04-03: 500 [IU]
  Filled 2012-04-03: qty 5

## 2012-04-03 NOTE — Discharge Summary (Signed)
NAME:  [name], STALLING                 ACCOUNT NO.:  [id number]  MEDICAL RECORD NO.:  [id number]  LOCATION:  A201                          FACILITY:  [facility]  PHYSICIAN:  Melvyn Novas, MDDATE OF BIRTH:  16-Aug-1940  DATE OF ADMISSION:  04/01/2012 DATE OF DISCHARGE:  LH                              DISCHARGE SUMMARY   The patient is a 71 year old black female who is extremely poor historian, has history of multiple myeloma, history of sigmoid resection for carcinoma, and likewise has hypertension, recurrent UTI, stress urinary incontinence, GERD.  She was admitted she states with some rectal bleeding, she found on the pad, she likewise is status post vaginal hysterectomy.  Hemoglobin was 10.0 in the ER, she was admitted. Gastroenterology consult was obtained.  Rectal was heme-negative in the ER, however, she had no evidence of clinical bleed, this by monitoring stools for 3 days.  In the hospital, hemoglobin drifted down slowly to 8.9.  This could be partially dilutional from 10 on admission.  She was hemodynamically stable throughout her hospital stay.  She has some mild hypokalemia which was rectified and tolerated food well.  There was no evidence of melena, hematemesis, hematochezia.  It was felt the patient might have confused hematuria for red blood per rectum and therefore due to small UTI, she was placed on Cipro 500 p.o. b.i.d. empirically for UTI.  She tolerated this well.  She was subsequently discharged on the following medicines, Norvasc 5 mg p.o. daily, aspirin 81 mg p.o. daily, Diovan 160 p.o. daily, Bystolic 5 mg p.o. daily, Ditropan 5 mg p.o. daily, Prilosec 20 mg p.o. daily, potassium chloride 20 mEq p.o. daily, Crestor 20 mg p.o. daily, and Cipro 500 p.o. b.i.d. for 5 days.  She will follow up in the office with all of the medicines to organize her outpatient regimen in 3 days time.     Melvyn Novas, MD     RMD/MEDQ  D:  04/03/2012  T:   04/03/2012  Job:  [id number]

## 2012-04-03 NOTE — Progress Notes (Signed)
Discharge instructions and prescriptions given, verbalized understanding, out in stable condition via w/c with staff. 

## 2012-04-03 NOTE — Telephone Encounter (Signed)
Pt needs FU 4 weeks hospitalization anemia w/ me Thanks

## 2012-04-05 ENCOUNTER — Encounter: Payer: Self-pay | Admitting: Urgent Care

## 2012-04-05 NOTE — Telephone Encounter (Signed)
Pt is aware of OV on 1/9 at 10 am with KJ and appt card was mailed

## 2012-04-09 ENCOUNTER — Other Ambulatory Visit (HOSPITAL_COMMUNITY): Payer: PRIVATE HEALTH INSURANCE

## 2012-04-10 ENCOUNTER — Other Ambulatory Visit (HOSPITAL_COMMUNITY): Payer: Self-pay | Admitting: Oncology

## 2012-04-10 DIAGNOSIS — C9000 Multiple myeloma not having achieved remission: Secondary | ICD-10-CM

## 2012-04-10 MED ORDER — POMALIDOMIDE 4 MG PO CAPS: ORAL_CAPSULE | ORAL | Status: DC

## 2012-04-11 ENCOUNTER — Ambulatory Visit (HOSPITAL_COMMUNITY)
Admission: RE | Admit: 2012-04-11 | Discharge: 2012-04-11 | Disposition: A | Discharge status: Home / Self Care | Payer: PRIVATE HEALTH INSURANCE | Source: Ambulatory Visit | Attending: Oncology | Admitting: Oncology

## 2012-04-11 ENCOUNTER — Other Ambulatory Visit (HOSPITAL_COMMUNITY): Payer: PRIVATE HEALTH INSURANCE

## 2012-04-11 ENCOUNTER — Encounter (HOSPITAL_COMMUNITY): Payer: Self-pay | Admitting: Oncology

## 2012-04-11 ENCOUNTER — Encounter (HOSPITAL_COMMUNITY): Payer: PRIVATE HEALTH INSURANCE | Attending: Oncology | Admitting: Oncology

## 2012-04-11 VITALS — BP 136/72 | HR 64 | Temp 98.6°F | Resp 20 | Wt 223.8 lb

## 2012-04-11 DIAGNOSIS — R229 Localized swelling, mass and lump, unspecified: Secondary | ICD-10-CM

## 2012-04-11 DIAGNOSIS — I10 Essential (primary) hypertension: Secondary | ICD-10-CM

## 2012-04-11 DIAGNOSIS — C9000 Multiple myeloma not having achieved remission: Secondary | ICD-10-CM

## 2012-04-11 DIAGNOSIS — E119 Type 2 diabetes mellitus without complications: Secondary | ICD-10-CM | POA: Insufficient documentation

## 2012-04-11 DIAGNOSIS — F17200 Nicotine dependence, unspecified, uncomplicated: Secondary | ICD-10-CM | POA: Insufficient documentation

## 2012-04-11 DIAGNOSIS — I808 Phlebitis and thrombophlebitis of other sites: Secondary | ICD-10-CM | POA: Insufficient documentation

## 2012-04-11 LAB — COMPREHENSIVE METABOLIC PANEL
AST: 12 U/L (ref 0–37)
BUN: 17 mg/dL (ref 6–23)
CO2: 25 mEq/L (ref 19–32)
Calcium: 9.7 mg/dL (ref 8.4–10.5)
Chloride: 106 mEq/L (ref 96–112)
Creatinine, Ser: 1.4 mg/dL — ABNORMAL HIGH (ref 0.50–1.10)
GFR calc Af Amer: 43 mL/min — ABNORMAL LOW (ref >90)
GFR calc non Af Amer: 37 mL/min — ABNORMAL LOW (ref >90)
Total Bilirubin: 0.4 mg/dL (ref 0.3–1.2)

## 2012-04-11 LAB — CBC
HCT: 35 % — ABNORMAL LOW (ref 36.0–46.0)
Hemoglobin: 11 g/dL — ABNORMAL LOW (ref 12.0–15.0)
MCHC: 31.4 g/dL (ref 30.0–36.0)
MCV: 94.3 fL (ref 78.0–100.0)
RDW: 17.1 % — ABNORMAL HIGH (ref 11.5–15.5)
WBC: 2.5 10*3/uL — ABNORMAL LOW (ref 4.0–10.5)

## 2012-04-11 LAB — DIFFERENTIAL
Basophils Absolute: 0 10*3/uL (ref 0.0–0.1)
Eosinophils Relative: 11 % — ABNORMAL HIGH (ref 0–5)
Lymphocytes Relative: 21 % (ref 12–46)
Monocytes Absolute: 0.2 10*3/uL (ref 0.1–1.0)
Monocytes Relative: 9 % (ref 3–12)
Neutro Abs: 1.4 10*3/uL — ABNORMAL LOW (ref 1.7–7.7)

## 2012-04-11 MED ORDER — HEPARIN SOD (PORK) LOCK FLUSH 100 UNIT/ML IV SOLN
INTRAVENOUS | Status: AC
  Filled 2012-04-11: qty 5

## 2012-04-11 MED ORDER — SODIUM CHLORIDE 0.9 % IJ SOLN
INTRAMUSCULAR | Status: AC
  Filled 2012-04-11: qty 30

## 2012-04-11 MED ORDER — HEPARIN SOD (PORK) LOCK FLUSH 100 UNIT/ML IV SOLN
500.0000 [IU] | Freq: Once | INTRAVENOUS | Status: DC
  Filled 2012-04-11: qty 5

## 2012-04-11 MED ORDER — POMALIDOMIDE 4 MG PO CAPS: ORAL_CAPSULE | ORAL | Status: DC

## 2012-04-11 MED ORDER — SODIUM CHLORIDE 0.9 % IJ SOLN
INTRAMUSCULAR | Status: AC
  Filled 2012-04-11: qty 10

## 2012-04-11 MED ORDER — SODIUM CHLORIDE 0.9 % IJ SOLN
10.0000 mL | INTRAMUSCULAR | Status: DC | PRN
  Filled 2012-04-11: qty 10

## 2012-04-11 NOTE — Progress Notes (Signed)
[name] presented for Portacath access and flush. Proper placement of portacath confirmed by CXR. Portacath located right chest wall accessed with  H 20 needle. No blood return, flushed multiple times with no resistance or pain, still no blood return. Portacath flushed with 20ml NS and 500U/72ml Heparin and needle removed intact. Procedure without incident. Patient tolerated procedure well.

## 2012-04-11 NOTE — Progress Notes (Signed)
[name], MD [address] [place] [address]  1. MULTIPLE  MYELOMA  Beta 2 microglobuline, serum, C-reactive protein    CURRENT THERAPY: Pomalyst 21 days on and 7 days off. Due to start cycle 8 on 04/19/2012  INTERVAL HISTORY: KIMBERLEE SHOUN 71 y.o. female returns for  regular  visit for followup of   IgG lambda multiple myeloma evolving out of an MGUS since 1997 but with overt myeloma since 07/16/2009. 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   She remains taking the Pomalyst with weekly Dexamethasone for her multiple myeloma treatment.  Labs from 03/08/2012 are stable.  Jayce was admitted to the [facility] on 04/01/2012 and discharged on 04/04/2012 for a UTI and rectal bleeding.  Gi was consulted and Dr. Karilyn Cota (GI) thought she had a vaginal bleed, not rectal bleeding.   Caroll is due for labs today which includes her multiple myeloma panel.    She reports only 1 complaint and that is a left distal forearm pain with a "knot."  She reports that she just recently noticed it.  She does admit to some discomfort on palpation.  She denies any trauma to this area.  She denies any fevers or chills.  She has full ROM of her left hand, wrist, elbow, and arm.   There is some confusion with her home medications that are extraneous to her multiple myeloma treatment.  We will ask her to contact her pharmacy for the medications that she needs refilled so the Rx requests are sent to the appropriate provider.    She asks, "Why did I get multiple myeloma."  So I spent some time answering this question for her.    She asks if she can have 1 glass of spiked eggnog on Christmas and I gave her permission to have 1 glass.   Otherwise, she denies any complaints.  Complete ROS questioning is negative.  She denies any pain.  She is ambulating at home.  She denies any loss of LE sensation or bowel and bladder control.   Past Medical History   Diagnosis Date  . Hypertension   . Arteriosclerotic cardiovascular disease (ASCVD)     Former patient of [facility] - PTCA apical LAD 2005  . Borderline diabetes     + proteinuria  . GERD (gastroesophageal reflux disease)     + constipation, gastroparesis  . Hyperlipidemia   . Female stress incontinence     H/o urinary tract infection  . Vertigo   . Insomnia   . Colon cancer     1975  . Multiple myeloma(203.0)   . Osteoarthritis     cervical spine; status post surgery  . CVA (cerebral infarction) 01/2012    has MULTIPLE  MYELOMA; Anemia, normocytic normochromic; Obesity, Class II, BMI 35-39.9, with comorbidity; Hypertension; Arteriosclerotic cardiovascular disease (ASCVD); Borderline diabetes; GERD (gastroesophageal reflux disease); Hyperlipidemia; and Cerebrovascular disease on her problem list.     is allergic to gabapentin and simvastatin.  Ms. Martinez had no medications administered during this visit.  Past Surgical History  Procedure Date  . Partial colectomy 1995     [facility], colon cancer  . Abdominal hysterectomy 1970s    [facility]; secondary to metromenorrhagia  . Meniscus repair 2003    Arthroscopic, left,  [facility]  . Heel spur surgery     [facility], [facility]  . Cervical spine surgery 2004    [facility]  . Port-a-cath removal 11/05/2010  Procedure: REMOVAL PORT-A-CATH;  Surgeon: Dalia Heading;  Location: [facility];  Service: General;  Laterality: Left;  started at 0846  . Portacath placement 11/05/2010    Procedure: INSERTION PORT-A-CATH;  Surgeon: Dalia Heading;  Location: [facility];  Service: General;  Laterality: Right;  Right subclavian, device left accessed   . Colonoscopy  07/02/2007    Normal rectum status post prior sigmoid resection with normal anastomosis identified at 35 cm.  Normal residual colon.Marland Kitchen Next TCS 06/2012  . Esophagogastroduodenoscopy   08/22/2007    Small hiatal hernia/A circular, purplish, 1-cm, distal esophageal submucosal lesion of uncertain significance, otherwise normal  esophagus. EUS -esophageal bleb (aberrant blood vessel). retained gastric contents/food  . Appendectomy 1953    Denies any headaches, dizziness, double vision, fevers, chills, night sweats, nausea, vomiting, diarrhea, constipation, chest pain, heart palpitations, shortness of breath, blood in stool, black tarry stool, urinary pain, urinary burning, urinary frequency, hematuria.   PHYSICAL EXAMINATION  ECOG PERFORMANCE STATUS: 2 - Symptomatic, <50% confined to bed  Filed Vitals:   04/11/12 0900  BP: 136/72  Pulse: 64  Temp: 98.6 F (37 C)  Resp: 20    GENERAL:alert, no distress, well nourished, well developed, comfortable, cooperative, obese and smiling.  Accompanied by daughter. SKIN: skin color, texture, turgor are normal, no rashes or significant lesions HEAD: Normocephalic, No masses, lesions, tenderness or abnormalities EYES: normal, PERRLA, EOMI, Conjunctiva are pink and non-injected EARS: External ears normal OROPHARYNX:lips, buccal mucosa, and tongue normal and mucous membranes are moist  NECK: supple, trachea midline LYMPH:  not examined BREAST:not examined LUNGS: clear to auscultation  HEART: regular rate & rhythm, no murmurs, no gallops, S1 normal and S2 normal ABDOMEN:abdomen soft, non-tender, obese and normal bowel sounds BACK: Back symmetric, no curvature. EXTREMITIES:less then 2 second capillary refill, no joint deformities, effusion, no clubbing, no cyanosis. Positive finding: left forearm on the radial side proximal to the wrist there is a small edematous area measuring 1-1.5 cm round with some mild erythema noted and some minimal, but identifiable edema associated with it.  No ecchymosis noted.  NEURO: alert & oriented x 3 with fluent speech, no focal motor/sensory deficits.  Residual weakness on left, but more improved.   LABORATORY DATA: CBC    Component Value Date/Time   WBC 5.2 04/02/2012 0209   RBC 3.36* 04/02/2012 0209   HGB 8.9* 04/03/2012 0229   HCT  28.3* 04/03/2012 0229   PLT 205 04/02/2012 0209   MCV 94.0 04/02/2012 0209   MCH 29.8 04/02/2012 0209   MCHC 31.6 04/02/2012 0209   RDW 16.1* 04/02/2012 0209   LYMPHSABS 1.1 04/01/2012 0246   MONOABS 0.8 04/01/2012 0246   EOSABS 0.3 04/01/2012 0246   BASOSABS 0.0 04/01/2012 0246       ASSESSMENT: 1. 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 She is presently status post 7 cycles of pomalyst 21 days on 7 days off. She is in her 7 day break, presently. She is due to start cycle 8 on 04/19/2012.  2. Hypertension  3. 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 4. 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 5. Left forearm nodule   PLAN:  1. I personally reviewed and went over laboratory results with the patient. 2. Labs today: CBC diff, CMET,  MM panel, B-2 Microglobulin, CRP.  3. Begin Pomalyst as scheduled on 04/15/2012 for cycle 7 after 7 day break.   4. Lab work in 4 and 8 weeks: CBC diff, CMET, MM panel, B-2 Microglobulin, CRP.  5. Follow-up with GI as directed on 05/04/2011 6. [place] of left forearm to evaluate for DVT 7. X-ray of left forearm to evaluate for lytic lesions.  8. Patient asked to contact her pharmacy for refills as needed.  9. Return in 2 month for follow-up.  Continue with strength training as directed following stroke.    All questions were answered. The patient knows to call the clinic with any problems, questions or concerns. We can certainly see the patient much sooner if necessary.  The patient and plan discussed with Glenford Peers, MD and he is in agreement with the aforementioned.  KEFALAS,THOMAS

## 2012-04-11 NOTE — Patient Instructions (Addendum)
[facility] Discharge Instructions  RECOMMENDATIONS MADE BY THE CONSULTANT AND ANY TEST RESULTS WILL BE SENT TO YOUR REFERRING PHYSICIAN.   Lab work today. Lab work every 4 weeks. Port flush every 6 weeks as scheduled. You are to continue Pomalyst as scheduled. Call your pharmacy for medication refills, they will send [place] what we need to refill and Dr.Dondiego what he needs to refill. You are to go to Radiology when you leave [place] for an Ultrasound of your left arm. We will call you with the results. Return in 8 weeks to see MD. Report any issues/concerns to clinic as needed.   Thank you for choosing [facility] to provide your oncology and hematology care.  To afford each patient quality time with our providers, please arrive at least 15 minutes before your scheduled appointment time.  With your help, our goal is to use those 15 minutes to complete the necessary work-up to ensure our physicians have the information they need to help with your evaluation and healthcare recommendations.    Effective January 1st, 2014, we ask that you re-schedule your appointment with our physicians should you arrive 10 or more minutes late for your appointment.  We strive to give you quality time with our providers, and arriving late affects you and other patients whose appointments are after yours.    Again, thank you for choosing [facility].  Our hope is that these requests will decrease the amount of time that you wait before being seen by our physicians.       _____________________________________________________________  I acknowledge that I have been informed and understand all the instructions given to me and received a copy. I do not have anymore questions at this time but understand that I may call the Cancer Center at [facility] at [phone] during business hours should I have any further questions or need assistance in obtaining follow-up  care.    __________________________________________  _____________  __________ Signature of Patient or Authorized Representative            Date                   Time    __________________________________________ Nurse's Signature .

## 2012-04-12 LAB — KAPPA/LAMBDA LIGHT CHAINS: Kappa, lambda light chain ratio: 0.43 (ref 0.26–1.65)

## 2012-04-20 LAB — MULTIPLE MYELOMA PANEL, SERUM
Albumin ELP: 57.6 % (ref 55.8–66.1)
Alpha-1-Globulin: 4.9 % (ref 2.9–4.9)
IgA: 172 mg/dL (ref 69–380)
IgG (Immunoglobin G), Serum: 1110 mg/dL (ref 690–1700)
IgM, Serum: 63 mg/dL (ref 52–322)
Total Protein: 7 g/dL (ref 6.0–8.3)

## 2012-04-23 ENCOUNTER — Encounter (HOSPITAL_COMMUNITY): Payer: PRIVATE HEALTH INSURANCE

## 2012-05-03 ENCOUNTER — Ambulatory Visit: Payer: PRIVATE HEALTH INSURANCE | Admitting: Urgent Care

## 2012-05-07 ENCOUNTER — Other Ambulatory Visit (HOSPITAL_COMMUNITY): Payer: PRIVATE HEALTH INSURANCE

## 2012-05-07 ENCOUNTER — Encounter: Payer: Self-pay | Admitting: Internal Medicine

## 2012-05-08 ENCOUNTER — Ambulatory Visit (INDEPENDENT_AMBULATORY_CARE_PROVIDER_SITE_OTHER): Payer: PRIVATE HEALTH INSURANCE | Admitting: Urgent Care

## 2012-05-08 ENCOUNTER — Encounter: Payer: Self-pay | Admitting: Urgent Care

## 2012-05-08 VITALS — BP 130/88 | HR 49 | Temp 97.6°F | Ht 65.0 in | Wt 226.2 lb

## 2012-05-08 DIAGNOSIS — K219 Gastro-esophageal reflux disease without esophagitis: Secondary | ICD-10-CM

## 2012-05-08 DIAGNOSIS — K5900 Constipation, unspecified: Secondary | ICD-10-CM

## 2012-05-08 DIAGNOSIS — D126 Benign neoplasm of colon, unspecified: Secondary | ICD-10-CM | POA: Insufficient documentation

## 2012-05-08 MED ORDER — POLYETHYLENE GLYCOL 3350 17 GM/SCOOP PO POWD: 17.0000 g | Freq: Every day | ORAL | Status: DC

## 2012-05-08 MED ORDER — DOCUSATE SODIUM 100 MG PO CAPS: 100.0000 mg | ORAL_CAPSULE | Freq: Two times a day (BID) | ORAL | Status: DC

## 2012-05-08 NOTE — Assessment & Plan Note (Addendum)
Chronic constipation is no doubt contributing to her abdominal pain.  Recent colonoscopy & EGD reassuring.    Miralax 17 grams daily for constipation.  Hold if diarrhea. Colace 100mg  stool softener daily for constipation Office visit in 3 months or sooner if needed

## 2012-05-08 NOTE — Assessment & Plan Note (Signed)
Well controlled on BID PPI.  Pt intolerant of once daily dose due to symptom return.  Recent EGD normal.  Continue omeprazole 20mg  before breakfast & dinner

## 2012-05-08 NOTE — Patient Instructions (Addendum)
Miralax 17 grams daily for constipation.  Hold if diarrhea. Colace 100mg  stool softener daily for constipation Continue omeprazole 20mg  before breakfast & dinner Next colonoscopy 12/2014 Office visit in 3 months or sooner if needed

## 2012-05-08 NOTE — Progress Notes (Signed)
Faxed to PCP

## 2012-05-08 NOTE — Progress Notes (Signed)
Referring Provider: [name], MD Primary Care Physician:  [name], MD Primary Gastroenterologist:  Dr. Jena Gauss  Chief Complaint  Patient presents with  . Follow-up    tried    HPI:  [name] is a 72 y.o. female here for follow up for hx chronic abdominal pain.  Hx GERD, gastroparesis, chronic constipation & hx colon cancer. Last EGD was normal 12/2011.  Last colonoscopy 12/2011 she had 2 tubular adenomas removed.   Denies vomiting.  C/o chronic constipation where she can go up to 1 week without a BM.  She is taking dulcolax 2 per night.  C/o generalized abdominal pain.  Taking omeprazole 20mg  BID.  She feels like pain radiates from abdomen up esophagus.  Drinking a large gulp of water helps the pain.  Denies dysphagia or odynophagia.  Weight stable.  Followed by Dr Mariel Sleet for MM.   Past Medical History  Diagnosis Date  . Hypertension   . Arteriosclerotic cardiovascular disease (ASCVD)     Former patient of [facility] - PTCA apical LAD 2005  . Borderline diabetes     + proteinuria  . GERD (gastroesophageal reflux disease)     + constipation, gastroparesis  . Hyperlipidemia   . Female stress incontinence     H/o urinary tract infection  . Vertigo   . Insomnia   . Colon cancer     1975  . Multiple myeloma(203.0)   . Osteoarthritis     cervical spine; status post surgery  . CVA (cerebral infarction) 01/2012  . Tubular adenoma of colon 01/09/12  . Gastroparesis 2009    abnl GES    Past Surgical History  Procedure Date  . Partial colectomy 1995     [facility], colon cancer  . Abdominal hysterectomy 1970s    [facility]; secondary to metromenorrhagia  . Meniscus repair 2003    [facility]throscopic, left,  [facility]  . Heel spur surgery     [facility], [facility]  . Cervical spine surgery 2004    [facility]  . Port-a-cath removal 11/05/2010    Procedure: REMOVAL PORT-A-CATH;  Surgeon: Dalia Heading;  Location: [facility];  Service: General;  Laterality: Left;  started at 0846  . Portacath placement  11/05/2010    Procedure: INSERTION PORT-A-CATH;  Surgeon: Dalia Heading;  Location: [facility];  Service: General;  Laterality: Right;  Right subclavian, device left accessed   . Colonoscopy  07/02/2007    RMR: Normal rectum status post prior sigmoid resection with normal anastomosis identified at 35 cm.  Normal residual colon.Marland Kitchen Next TCS 06/2012  . Esophagogastroduodenoscopy   08/22/2007    ZOX:WRUEA hiatal hernia/A circular, purplish, 1-cm, distal esophageal submucosal lesion of uncertain significance, otherwise normal esophagus. EUS -esophageal bleb (aberrant blood vessel). retained gastric contents/food  . Appendectomy 1953  . Esophagogastroduodenoscopy 01/09/12    Rourk: small HH, otherwise normal EGD  . Colonoscopy 01/09/12    Rourk: surgical anastamosis @ 25cm.  2 diminutive polyps (tubular adenomas) removed    Current Outpatient Prescriptions  Medication Sig Dispense Refill  . amLODipine (NORVASC) 5 MG tablet Take 1 tablet (5 mg total) by mouth daily.  30 tablet  2  . aspirin EC 81 MG tablet Take 81 mg by mouth daily.      . bisacodyl (DULCOLAX) 5 MG EC tablet Take 5 mg by mouth daily as needed. For constipation      . cholecalciferol (VITAMIN D) 1000 UNITS tablet Take 1,000 Units by mouth daily.      Marland Kitchen dexamethasone (  DECADRON) 4 MG tablet Take 20 mg by mouth once a week. Wednesday      . DIOVAN 160 MG tablet Take 1 tablet by mouth 2 (two) times daily.      . furosemide (LASIX) 20 MG tablet Take 1 tablet (20 mg total) by mouth as needed. Fluid Retention  30 tablet  0  . nebivolol (BYSTOLIC) 5 MG tablet Take 2.5-10 mg by mouth 2 (two) times daily. Take 10 mg in the am and take 2.5 mg in the pm by mouth daily.      Marland Kitchen omeprazole (PRILOSEC) 20 MG capsule Take 20 mg by mouth 2 (two) times daily.       Marland Kitchen oxybutynin (DITROPAN) 5 MG tablet Take 1 tablet (5 mg total) by mouth daily.  30 tablet  3  . oxyCODONE-acetaminophen (PERCOCET/ROXICET) 5-325 MG per tablet Take 1 tablet by mouth every 6 (six)  hours as needed. pain  60 tablet  0  . pomalidomide (POMALYST) 4 MG capsule Take 1 tablet, PO daily x 21 days with 7 day respite (04/19/2012)  21 capsule  0  . potassium chloride SA (K-DUR,KLOR-CON) 20 MEQ tablet Take 20 mEq by mouth daily.      Marland Kitchen docusate sodium (COLACE) 100 MG capsule Take 1 capsule (100 mg total) by mouth 2 (two) times daily.  10 capsule  0  . polyethylene glycol powder (MIRALAX) powder Take 17 g by mouth daily.  527 g  11  . [DISCONTINUED] oxybutynin (DITROPAN) 5 MG tablet Take 1 tablet (5 mg total) by mouth daily.  30 tablet  3   No current facility-administered medications for this visit.   Facility-Administered Medications Ordered in Other Visits  Medication Dose Route Frequency Provider Last Rate Last Dose  . sodium chloride 0.9 % injection 10 mL  10 mL Intravenous PRN Randall An, MD   10 mL at 10/26/11 1119    Allergies as of 05/08/2012 - Review Complete 05/08/2012  Allergen Reaction Noted  . Gabapentin Other (See Comments) 01/06/2011  . Simvastatin Other (See Comments)     Review of Systems: Gen: Denies any fever, chills, sweats, anorexia, fatigue, weakness, malaise, weight loss, and sleep disorder CV: Denies chest pain, angina, palpitations, syncope, orthopnea, PND, peripheral edema, and claudication. Resp: Denies dyspnea at rest, dyspnea with exercise, cough, sputum, wheezing, coughing up blood, and pleurisy. GI: Denies vomiting blood, jaundice, and fecal incontinence. Derm: Denies rash, itching, dry skin, hives, moles, warts, or unhealing ulcers.  Psych: Denies depression, anxiety, memory loss, suicidal ideation, hallucinations, paranoia, and confusion. Heme: Denies bruising, bleeding, and enlarged lymph nodes.  Physical Exam: BP 130/88  Pulse 49  Temp 97.6 F (36.4 C) (Oral)  Ht 5\' 5"  (1.651 m)  Wt 226 lb 3.2 oz (102.604 kg)  BMI 37.64 kg/m2 General:   Alert,  Well-developed, well-nourished, pleasant and cooperative in NAD.  Accompanied by her  son.  Eyes:  Sclera clear, no icterus.   Conjunctiva pink. Mouth:  No deformity or lesions, oropharynx pink and moist. Neck:  Supple; no masses or thyromegaly. Heart:  Regular rate and rhythm; no murmurs, clicks, rubs,  or gallops. Abdomen:  Normal bowel sounds.  No bruits.  Soft, non-tender and non-distended without masses, hepatosplenomegaly or hernias noted.  No guarding or rebound tenderness.   Rectal:  Deferred.  Msk:  Symmetrical without gross deformities.  Pulses:  Normal pulses noted. Extremities: + clubbing.  1+ pretibial edema bilat.  Neurologic:  Alert and oriented x4;  grossly normal neurologically. Skin:  Intact without significant lesions or rashes.

## 2012-05-09 ENCOUNTER — Encounter (HOSPITAL_COMMUNITY): Payer: PRIVATE HEALTH INSURANCE | Attending: Oncology | Admitting: Oncology

## 2012-05-09 ENCOUNTER — Encounter (HOSPITAL_COMMUNITY): Payer: Self-pay | Admitting: Oncology

## 2012-05-09 ENCOUNTER — Other Ambulatory Visit (HOSPITAL_COMMUNITY): Payer: Self-pay | Admitting: Oncology

## 2012-05-09 ENCOUNTER — Other Ambulatory Visit (HOSPITAL_COMMUNITY): Payer: PRIVATE HEALTH INSURANCE

## 2012-05-09 VITALS — BP 136/85 | HR 70 | Temp 98.0°F | Resp 18 | Wt 226.0 lb

## 2012-05-09 DIAGNOSIS — G620 Drug-induced polyneuropathy: Secondary | ICD-10-CM

## 2012-05-09 DIAGNOSIS — C9000 Multiple myeloma not having achieved remission: Secondary | ICD-10-CM

## 2012-05-09 DIAGNOSIS — L270 Generalized skin eruption due to drugs and medicaments taken internally: Secondary | ICD-10-CM

## 2012-05-09 DIAGNOSIS — E876 Hypokalemia: Secondary | ICD-10-CM

## 2012-05-09 DIAGNOSIS — N3281 Overactive bladder: Secondary | ICD-10-CM

## 2012-05-09 DIAGNOSIS — Z8673 Personal history of transient ischemic attack (TIA), and cerebral infarction without residual deficits: Secondary | ICD-10-CM

## 2012-05-09 LAB — CBC
Hemoglobin: 10.2 g/dL — ABNORMAL LOW (ref 12.0–15.0)
MCH: 30.3 pg (ref 26.0–34.0)
MCV: 95.5 fL (ref 78.0–100.0)
Platelets: 209 10*3/uL (ref 150–400)
RBC: 3.37 MIL/uL — ABNORMAL LOW (ref 3.87–5.11)
WBC: 4.2 10*3/uL (ref 4.0–10.5)

## 2012-05-09 LAB — COMPREHENSIVE METABOLIC PANEL
ALT: 12 U/L (ref 0–35)
Alkaline Phosphatase: 50 U/L (ref 39–117)
BUN: 21 mg/dL (ref 6–23)
CO2: 24 mEq/L (ref 19–32)
Calcium: 9.6 mg/dL (ref 8.4–10.5)
GFR calc Af Amer: 59 mL/min — ABNORMAL LOW (ref >90)
GFR calc non Af Amer: 51 mL/min — ABNORMAL LOW (ref >90)
Glucose, Bld: 195 mg/dL — ABNORMAL HIGH (ref 70–99)
Potassium: 3.3 mEq/L — ABNORMAL LOW (ref 3.5–5.1)
Sodium: 141 mEq/L (ref 135–145)

## 2012-05-09 LAB — DIFFERENTIAL
Eosinophils Relative: 19 % — ABNORMAL HIGH (ref 0–5)
Lymphocytes Relative: 25 % (ref 12–46)
Lymphs Abs: 1.1 10*3/uL (ref 0.7–4.0)
Monocytes Relative: 17 % — ABNORMAL HIGH (ref 3–12)

## 2012-05-09 MED ORDER — SODIUM CHLORIDE 0.9 % IJ SOLN
10.0000 mL | INTRAMUSCULAR | Status: DC | PRN
  Administered 2012-05-09: 10 mL via INTRAVENOUS
  Filled 2012-05-09: qty 10

## 2012-05-09 MED ORDER — OXYCODONE-ACETAMINOPHEN 5-325 MG PO TABS: 1.0000 | ORAL_TABLET | Freq: Four times a day (QID) | ORAL | Status: DC | PRN

## 2012-05-09 MED ORDER — POMALIDOMIDE 4 MG PO CAPS: ORAL_CAPSULE | ORAL | Status: DC

## 2012-05-09 MED ORDER — POTASSIUM CHLORIDE CRYS ER 20 MEQ PO TBCR: 20.0000 meq | EXTENDED_RELEASE_TABLET | Freq: Every day | ORAL | Status: DC

## 2012-05-09 MED ORDER — DEXAMETHASONE 4 MG PO TABS: 20.0000 mg | ORAL_TABLET | ORAL | Status: DC

## 2012-05-09 MED ORDER — HEPARIN SOD (PORK) LOCK FLUSH 100 UNIT/ML IV SOLN
INTRAVENOUS | Status: AC
  Filled 2012-05-09: qty 5

## 2012-05-09 MED ORDER — OXYBUTYNIN CHLORIDE 5 MG PO TABS: 5.0000 mg | ORAL_TABLET | Freq: Every day | ORAL | Status: DC

## 2012-05-09 MED ORDER — HEPARIN SOD (PORK) LOCK FLUSH 100 UNIT/ML IV SOLN
500.0000 [IU] | Freq: Once | INTRAVENOUS | Status: AC
  Administered 2012-05-09: 500 [IU] via INTRAVENOUS
  Filled 2012-05-09: qty 5

## 2012-05-09 NOTE — Progress Notes (Signed)
[name] presented for Portacath access and flush. Proper placement of portacath confirmed by CXR. Portacath located right chest wall accessed with  H 20 needle. Sluggish blood return after multiple flushes. Portacath flushed with 20ml NS and 500U/50ml Heparin and needle removed intact. Procedure without incident. Patient tolerated procedure well.  [name] presented for labwork. Labs per MD order drawn via Peripheral Line 23 gauge needle inserted in left antecubital.  Good blood return present. Procedure without incident.  Needle removed intact. Patient tolerated procedure well.

## 2012-05-09 NOTE — Patient Instructions (Addendum)
[facility] Discharge Instructions  RECOMMENDATIONS MADE BY THE CONSULTANT AND ANY TEST RESULTS WILL BE SENT TO YOUR REFERRING PHYSICIAN.  Lab work with port flush today. We will continue with labs and port flush every 4 weeks. See Tom in 4 weeks and Dr.Neijstrom in 8 weeks. Report any problems/concerns to clinic as needed prior to appointments.  Thank you for choosing [facility] to provide your oncology and hematology care.  To afford each patient quality time with our providers, please arrive at least 15 minutes before your scheduled appointment time.  With your help, our goal is to use those 15 minutes to complete the necessary work-up to ensure our physicians have the information they need to help with your evaluation and healthcare recommendations.    Effective January 1st, 2014, we ask that you re-schedule your appointment with our physicians should you arrive 10 or more minutes late for your appointment.  We strive to give you quality time with our providers, and arriving late affects you and other patients whose appointments are after yours.    Again, thank you for choosing [facility].  Our hope is that these requests will decrease the amount of time that you wait before being seen by our physicians.       _____________________________________________________________  Should you have questions after your visit to [facility], please contact our office at [phone] between the hours of 8:30 a.m. and 5:00 p.m.  Voicemails left after 4:30 p.m. will not be returned until the following business day.  For prescription refill requests, have your pharmacy contact our office with your prescription refill request.

## 2012-05-09 NOTE — Progress Notes (Signed)
Problem number 1 IgG lambda multiple myeloma. She was diagnosed with an MGUS in 1997 but then developed overt myeloma March of 2011. She occasionally also has an IgG kappa spike. This may be benign but presently she has only kappa light chains that a monoclonal detectable without an IgG associated monoclonal protein. She is doing very well with the pomalidomide 4 mg per 21 days on and 7 days off. This is keeping are very stable. She will start cycle 9 on 05/18/2012. We are also giving her maintenance dexamethasone 20 mg on Wednesdays of each week. We had decreased her dexamethasone from 20 mg twice a day to just once a day due to issues with her sugar. Problem #2 hypertension and she is somewhat confused about her medication so I've asked her to call her primary care doctor and her cardiologist to get this straightened out. She wanted [place] to refill these for her but I deferred that to them. Problem #3 Revlimid-induced skin toxicity gradually giving her less pain in both lower extremities but still tender especially over her anterior shins Problem #4 peripheral neuropathy grade 1 presently from Velcade Problem #5 recent CVA though she is getting return to function quite nicely. She is able to walk, speak etc.  You will has done very well overall. I'm very pleased with the pomalidomide the dexamethasone maintenance therapy. I think we need to continue that. We need to see her every month to make sure she does not get some his medications confused. Her legs are still tender however. Vital signs however are stable. Other blood work is stable as well. We will see her in a month.

## 2012-05-10 LAB — BETA 2 MICROGLOBULIN, SERUM: Beta-2 Microglobulin: 2.39 mg/L — ABNORMAL HIGH (ref 1.01–1.73)

## 2012-05-11 LAB — MULTIPLE MYELOMA PANEL, SERUM
Gamma Globulin: 12.9 % (ref 11.1–18.8)
IgG (Immunoglobin G), Serum: 884 mg/dL (ref 690–1700)
M-Spike, %: 0.28 g/dL
Total Protein: 6 g/dL (ref 6.0–8.3)

## 2012-05-18 ENCOUNTER — Encounter: Payer: Self-pay | Admitting: Oncology

## 2012-05-23 ENCOUNTER — Encounter (HOSPITAL_COMMUNITY): Payer: PRIVATE HEALTH INSURANCE

## 2012-06-06 ENCOUNTER — Encounter (HOSPITAL_COMMUNITY): Payer: Self-pay | Admitting: Oncology

## 2012-06-06 ENCOUNTER — Encounter (HOSPITAL_COMMUNITY): Payer: PRIVATE HEALTH INSURANCE | Attending: Oncology

## 2012-06-06 ENCOUNTER — Encounter (HOSPITAL_BASED_OUTPATIENT_CLINIC_OR_DEPARTMENT_OTHER): Payer: PRIVATE HEALTH INSURANCE | Admitting: Oncology

## 2012-06-06 VITALS — BP 139/75 | HR 67 | Temp 98.8°F | Resp 18 | Wt 227.7 lb

## 2012-06-06 DIAGNOSIS — G609 Hereditary and idiopathic neuropathy, unspecified: Secondary | ICD-10-CM

## 2012-06-06 DIAGNOSIS — I10 Essential (primary) hypertension: Secondary | ICD-10-CM

## 2012-06-06 DIAGNOSIS — C9000 Multiple myeloma not having achieved remission: Secondary | ICD-10-CM | POA: Insufficient documentation

## 2012-06-06 DIAGNOSIS — R109 Unspecified abdominal pain: Secondary | ICD-10-CM

## 2012-06-06 LAB — URINALYSIS, ROUTINE W REFLEX MICROSCOPIC
Hgb urine dipstick: NEGATIVE
Leukocytes, UA: NEGATIVE
Nitrite: NEGATIVE
Protein, ur: 30 mg/dL — AB
Specific Gravity, Urine: 1.025 (ref 1.005–1.030)
Urobilinogen, UA: 0.2 mg/dL (ref 0.0–1.0)

## 2012-06-06 LAB — COMPREHENSIVE METABOLIC PANEL
ALT: 13 U/L (ref 0–35)
Alkaline Phosphatase: 49 U/L (ref 39–117)
BUN: 18 mg/dL (ref 6–23)
Chloride: 101 mEq/L (ref 96–112)
GFR calc Af Amer: 52 mL/min — ABNORMAL LOW (ref >90)
Glucose, Bld: 237 mg/dL — ABNORMAL HIGH (ref 70–99)
Potassium: 4.2 mEq/L (ref 3.5–5.1)
Sodium: 138 mEq/L (ref 135–145)
Total Bilirubin: 0.4 mg/dL (ref 0.3–1.2)

## 2012-06-06 LAB — DIFFERENTIAL
Basophils Relative: 0 % (ref 0–1)
Eosinophils Absolute: 0.1 10*3/uL (ref 0.0–0.7)
Monocytes Relative: 4 % (ref 3–12)
Neutro Abs: 4.9 10*3/uL (ref 1.7–7.7)
Neutrophils Relative %: 83 % — ABNORMAL HIGH (ref 43–77)

## 2012-06-06 LAB — URINE MICROSCOPIC-ADD ON

## 2012-06-06 LAB — CBC
Hemoglobin: 10.9 g/dL — ABNORMAL LOW (ref 12.0–15.0)
MCHC: 31.8 g/dL (ref 30.0–36.0)
Platelets: 246 10*3/uL (ref 150–400)
RBC: 3.55 MIL/uL — ABNORMAL LOW (ref 3.87–5.11)

## 2012-06-06 MED ORDER — HEPARIN SOD (PORK) LOCK FLUSH 100 UNIT/ML IV SOLN
INTRAVENOUS | Status: AC
  Filled 2012-06-06: qty 5

## 2012-06-06 MED ORDER — POMALIDOMIDE 4 MG PO CAPS: ORAL_CAPSULE | ORAL | Status: DC

## 2012-06-06 MED ORDER — HEPARIN SOD (PORK) LOCK FLUSH 100 UNIT/ML IV SOLN
500.0000 [IU] | Freq: Once | INTRAVENOUS | Status: AC
  Administered 2012-06-06: 500 [IU] via INTRAVENOUS
  Filled 2012-06-06: qty 5

## 2012-06-06 MED ORDER — SODIUM CHLORIDE 0.9 % IJ SOLN
10.0000 mL | INTRAMUSCULAR | Status: DC | PRN
  Administered 2012-06-06: 10 mL via INTRAVENOUS
  Filled 2012-06-06: qty 10

## 2012-06-06 NOTE — Progress Notes (Signed)
[name] presented for Portacath access and flush. Proper placement of portacath confirmed by CXR. Portacath located rt chest wall accessed with  H 20 needle. Good blood return present. Portacath flushed with 20ml NS and 500U/29ml Heparin and needle removed intact. Procedure without incident. Patient tolerated procedure well.

## 2012-06-06 NOTE — Progress Notes (Signed)
Diane Stalling, MD [address] [place] [address]  1. MULTIPLE  MYELOMA  pomalidomide (POMALYST) 4 MG capsule   pomalidomide (POMALYST) 4 MG capsule    CURRENT THERAPY: Pomalyst 21 days on and 7 days off. Due to start cycle 10 approximately 2/21/201   INTERVAL HISTORY: ARLISSA MONTEVERDE 72 y.o. female returns for  regular  visit for followup of IgG lambda multiple myeloma evolving out of an MGUS since 1997 but with overt myeloma since 07/16/2009. 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 This may be benign but presently she has only kappa light chains that a monoclonal detectable without an IgG associated monoclonal protein.  She is tolerating therapy well.  She started cycle 9 on 05/18/2012. She reports that she has 4-5 doses of Pomalyst left (which is about right) and then she will be on her 7 day break.   She is also on maintenance dexamethasone 20 mg on Wednesdays of each week. We had decreased her dexamethasone from 20 mg twice a day to just once a day due to issues with her sugar.   Danaya reports an abdominal pain that was the worst pain she has ever experienced.  This occurred one time on last Saturday and has not occurred since.  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  She reports that the pain began in her low abdomen and then radiated superiorly to her chest.  She denies any SOB, dyspnea, diaphoresis, jaw numbness, arm numbness, etc.  She describes it as sharp pain.  It lasted for 20 minutes and resolved with pain medication.  She denies any correlation with food.  The pain happened while she was sitting down having a conversation.  She denies any physical exertion that prompted the pain.    She is no longer taking her carafate and in light of this nonspecific, one time occurrence of pain, I have asked her to restart her Carafate four times daily.  She is to contact us in  a week to let us know how she is doing.  She denies any urinary complaints including burning, odor, hematuria, pain.  She denies suprapubic pain as well.  Despite this, we will check a UA to verify that she does not have a UTI.   Otherwise, she is doing well.  She accompanied by her daughter.    Hematologic ROS questioning is negative and ROS questioning is negative.   She denies any loss of bowel and bladder.  She denies loss of ability to ambulate or manipulate her feet.     Past Medical History  Diagnosis Date  . Hypertension   . Arteriosclerotic cardiovascular disease (ASCVD)     Former patient of [facility] - PTCA apical LAD 2005  . Borderline diabetes     + proteinuria  . GERD (gastroesophageal reflux disease)     + constipation, gastroparesis  . Hyperlipidemia   . Female stress incontinence     H/o urinary tract infection  . Vertigo   . Insomnia   . Colon cancer     1975  . Multiple myeloma(203.0)   . Osteoarthritis     cervical spine; status post surgery  . CVA (cerebral infarction) 01/2012  . Tubular adenoma of colon 01/09/12  . Gastroparesis 2009    abnl GES    has MULTIPLE  MYELOMA; Anemia, normocytic normochromic; Obesity, Class II, BMI 35-39.9, with comorbidity; Hypertension; Arteriosclerotic cardiovascular  disease (ASCVD); Borderline diabetes; GERD (gastroesophageal reflux disease); Hyperlipidemia; Cerebrovascular disease; and Tubular adenoma of colon on her problem list.     is allergic to gabapentin and simvastatin.  Ms. Beretta had no medications administered during this visit.  Past Surgical History  Procedure Laterality Date  . Partial colectomy  1995     [facility], colon cancer  . Abdominal hysterectomy  1970s    [facility]; secondary to metromenorrhagia  . Meniscus repair  2003    Arthroscopic, left,  [facility]  . Heel spur surgery      [facility], [facility]  . Cervical spine surgery  2004    [facility]  . Port-a-cath removal  11/05/2010    Procedure: REMOVAL PORT-A-CATH;  Surgeon: Dalia Heading;  Location: [facility];  Service: General;  Laterality: Left;  started at 0846  . Portacath placement  11/05/2010    Procedure: INSERTION PORT-A-CATH;  Surgeon: Dalia Heading;  Location: [facility];  Service: General;  Laterality: Right;  Right subclavian, device left accessed   . Colonoscopy   07/02/2007    RMR: Normal rectum status post prior sigmoid resection with normal anastomosis identified at 35 cm.  Normal residual colon.Marland Kitchen Next TCS 06/2012  . Esophagogastroduodenoscopy    08/22/2007    EAV:WUJWJ hiatal hernia/A circular, purplish, 1-cm, distal esophageal submucosal lesion of uncertain significance, otherwise normal esophagus. EUS -esophageal bleb (aberrant blood vessel). retained gastric contents/food  . Appendectomy  1953  . Esophagogastroduodenoscopy  01/09/12    Rourk: small HH, otherwise normal EGD  . Colonoscopy  01/09/12    Rourk: surgical anastamosis @ 25cm.  2 diminutive polyps (tubular adenomas) removed    Denies any headaches, dizziness, double vision, fevers, chills, night sweats, nausea, vomiting, diarrhea, constipation, chest pain, heart palpitations, shortness of breath, blood in stool, black tarry stool, urinary pain, urinary burning, urinary frequency, hematuria.   PHYSICAL EXAMINATION  ECOG PERFORMANCE STATUS: 2 - Symptomatic, <50% confined to bed  Filed Vitals:   06/06/12 1100  BP: 139/75  Pulse:   Temp:   Resp:     GENERAL:alert, no distress, well nourished, well developed, comfortable, cooperative, obese and smiling SKIN: skin color, texture, turgor are normal, no rashes or significant lesions HEAD: Normocephalic, No masses, lesions, tenderness or abnormalities EYES: normal, Conjunctiva are pink and non-injected EARS: External ears normal OROPHARYNX:mucous membranes are moist  NECK: supple, trachea midline LYMPH:  not examined BREAST:not examined LUNGS: clear to auscultation  HEART: regular rate & rhythm, no murmurs, no gallops, S1 normal and S2  normal ABDOMEN:abdomen soft, non-tender, obese and normal bowel sounds BACK: Back symmetric, no curvature., No CVA tenderness EXTREMITIES:less then 2 second capillary refill, no joint deformities, effusion, or inflammation, no skin discoloration, no clubbing, no cyanosis, positive findings:  edema trace B/L LE edema.  Tenderness to palpation of shins.   NEURO: alert & oriented x 3 with fluent speech, no focal motor/sensory deficits, gait normal    LABORATORY DATA: Results for BYRD, TERRERO (MRN [id number]) as of 06/06/2012 11:14  Ref. Range 05/09/2012 14:33 05/09/2012 14:33  Sodium Latest Range: 135-145 mEq/L  141  Potassium Latest Range: 3.5-5.1 mEq/L  3.3 (L)  Chloride Latest Range: 96-112 mEq/L  106  CO2 Latest Range: 19-32 mEq/L  24  BUN Latest Range: 6-23 mg/dL  21  Creatinine Latest Range: 0.50-1.10 mg/dL  6.21  Calcium Latest Range: 8.4-10.5 mg/dL  9.6  GFR calc non Af Amer Latest Range: >90 mL/min  51 (L)  GFR calc Af Amer Latest Range: >90  mL/min  59 (L)  Glucose Latest Range: 70-99 mg/dL  161 (H)  Alkaline Phosphatase Latest Range: 39-117 U/L  50  Albumin Latest Range: 3.5-5.2 g/dL  3.3 (L)  AST Latest Range: 0-37 U/L  13  ALT Latest Range: 0-35 U/L  12  Total Protein Latest Range: 6.0-8.3 g/dL 6.0 6.3  Total Bilirubin Latest Range: 0.3-1.2 mg/dL  0.2 (L)  Beta-2 Microglobulin Latest Range: 1.01-1.73 mg/L 2.39 (H)   CRP Latest Range: <0.60 mg/dL 1.0 (H)   Albumin ELP Latest Range: 55.8-66.1 % 57.7   Alpha-1-Globulin Latest Range: 2.9-4.9 % 5.4 (H)   Alpha-2-Globulin Latest Range: 7.1-11.8 % 13.1 (H)   Beta Globulin Latest Range: 4.7-7.2 % 5.6   Beta 2 Latest Range: 3.2-6.5 % 5.3   Gamma Globulin Latest Range: 11.1-18.8 % 12.9   M-SPIKE, % No range found 0.28   SPE Interp. No range found (NOTE)   Comment No range found (NOTE)   IgG (Immunoglobin G), Serum Latest Range: 3057740605 mg/dL 096   IgA Latest Range: 69-380 mg/dL 045   IgM, Serum Latest Range: 52-322 mg/dL 53 (L)    Kappa free light chain Latest Range: 0.33-1.94 mg/dL 4.09   Lamda free light chains Latest Range: 0.57-2.63 mg/dL 8.11 (H)   Kappa, lamda light chain ratio Latest Range: 0.26-1.65  0.54   WBC Latest Range: 4.0-10.5 K/uL 4.2   RBC Latest Range: 3.87-5.11 MIL/uL 3.37 (L)   Hemoglobin Latest Range: 12.0-15.0 g/dL 91.4 (L)   HCT Latest Range: 36.0-46.0 % 32.2 (L)   MCV Latest Range: 78.0-100.0 fL 95.5   MCH Latest Range: 26.0-34.0 pg 30.3   MCHC Latest Range: 30.0-36.0 g/dL 78.2   RDW Latest Range: 11.5-15.5 % 16.8 (H)   Platelets Latest Range: 150-400 K/uL 209   Neutrophils Relative Latest Range: 43-77 % 39 (L)   Lymphocytes Relative Latest Range: 12-46 % 25   Monocytes Relative Latest Range: 3-12 % 17 (H)   Eosinophils Relative Latest Range: 0-5 % 19 (H)   Basophils Relative Latest Range: 0-1 % 1   NEUT# Latest Range: 1.7-7.7 K/uL 1.7   Lymphocytes Absolute Latest Range: 0.7-4.0 K/uL 1.1   Monocytes Absolute Latest Range: 0.1-1.0 K/uL 0.7   Eosinophils Absolute Latest Range: 0.0-0.7 K/uL 0.8 (H)   Basophils Absolute Latest Range: 0.0-0.1 K/uL 0.0      ASSESSMENT:  1. IgG lambda multiple myeloma. She was diagnosed with an MGUS in 1997 but then developed overt myeloma March of 2011. She occasionally also has an IgG kappa spike. This may be benign but presently she has only kappa light chains that a monoclonal detectable without an IgG associated monoclonal protein. She is doing very well with the pomalidomide 4 mg per 21 days on and 7 days off. This is keeping are very stable. She will start cycle 9 on 05/18/2012. We are also giving her maintenance dexamethasone 20 mg on Wednesdays of each week. We had decreased her dexamethasone from 20 mg twice a day to just once a day due to issues with her sugar.  2. Hypertension  3. Revlimid-induced skin toxicity gradually giving her less pain in both lower extremities but still tender especially over her anterior shins  4. 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 Recent CVA though she is getting return to function quite nicely. She is able to walk, speak etc. 6. One occurrence of severe abdominal pain 1 week ago, nonspecific.    PLAN:  1. I personally reviewed and went over laboratory results  with the patient. 2. Lab work today as scheduled: CBC diff, CMET, LDH, ESR, CRP, MM Panel 3. Restart Carafate four times per day. 4. UA today- negative for infection 5. Labs in 4 weeks: CBC diff, CMET, LDH, ESR, CRP, MM Panel. 6. Patient asked to call us in 1 week to inform us of her abdomen condition.  7. Return as scheduled.   All questions were answered. The patient knows to call the clinic with any problems, questions or concerns. We can certainly see the patient much sooner if necessary.  Patient and plan will be discussed with Dr. Mariel Sleet in the near future.   KEFALAS,THOMAS

## 2012-06-06 NOTE — Patient Instructions (Addendum)
[facility] Discharge Instructions  RECOMMENDATIONS MADE BY THE CONSULTANT AND ANY TEST RESULTS WILL BE SENT TO YOUR REFERRING PHYSICIAN.  EXAM FINDINGS BY THE PHYSICIAN TODAY AND SIGNS OR SYMPTOMS TO REPORT TO CLINIC OR PRIMARY PHYSICIAN: exam and discussion by PA.  Think your discomfort may be related to your past history of stomach ulcer.  Want you to restart your Carafate and take it 4 times daily.  Call us on Monday and let us know how you are doing.  Tobie Lords, RN  [phone])  MEDICATIONS PRESCRIBED:  Carafate take it 4 times daily.   INSTRUCTIONS GIVEN AND DISCUSSED: We will check a urine sample to day and if you have an infection we will let you know and call in a antibiotic for your to take.  SPECIAL INSTRUCTIONS/FOLLOW-UP: Return to be seen in 4 and 8 weeks for follow-up.   Thank you for choosing [facility] to provide your oncology and hematology care.  To afford each patient quality time with our providers, please arrive at least 15 minutes before your scheduled appointment time.  With your help, our goal is to use those 15 minutes to complete the necessary work-up to ensure our physicians have the information they need to help with your evaluation and healthcare recommendations.    Effective January 1st, 2014, we ask that you re-schedule your appointment with our physicians should you arrive 10 or more minutes late for your appointment.  We strive to give you quality time with our providers, and arriving late affects you and other patients whose appointments are after yours.    Again, thank you for choosing [facility].  Our hope is that these requests will decrease the amount of time that you wait before being seen by our physicians.       _____________________________________________________________  Should you have questions after your visit to [facility], please contact our office at [phone] between the  hours of 8:30 a.m. and 5:00 p.m.  Voicemails left after 4:30 p.m. will not be returned until the following business day.  For prescription refill requests, have your pharmacy contact our office with your prescription refill request.

## 2012-06-07 LAB — KAPPA/LAMBDA LIGHT CHAINS
Kappa free light chain: 1.05 mg/dL (ref 0.33–1.94)
Lambda free light chains: 3.54 mg/dL — ABNORMAL HIGH (ref 0.57–2.63)

## 2012-06-07 LAB — C-REACTIVE PROTEIN: CRP: 0.5 mg/dL — ABNORMAL LOW (ref <0.60)

## 2012-06-08 LAB — BETA 2 MICROGLOBULIN, SERUM: Beta-2 Microglobulin: 2.76 mg/L — ABNORMAL HIGH (ref 1.01–1.73)

## 2012-06-11 LAB — MULTIPLE MYELOMA PANEL, SERUM
Beta Globulin: 6.3 % (ref 4.7–7.2)
Gamma Globulin: 12.2 % (ref 11.1–18.8)
IgA: 137 mg/dL (ref 69–380)
IgG (Immunoglobin G), Serum: 1060 mg/dL (ref 690–1700)
M-Spike, %: 0.31 g/dL
Total Protein: 6.7 g/dL (ref 6.0–8.3)

## 2012-06-21 ENCOUNTER — Other Ambulatory Visit (HOSPITAL_COMMUNITY): Payer: Self-pay | Admitting: Oncology

## 2012-06-21 DIAGNOSIS — C9000 Multiple myeloma not having achieved remission: Secondary | ICD-10-CM

## 2012-06-21 DIAGNOSIS — K5900 Constipation, unspecified: Secondary | ICD-10-CM

## 2012-06-21 MED ORDER — POLYETHYLENE GLYCOL 3350 17 GM/SCOOP PO POWD: 17.0000 g | Freq: Every day | ORAL | Status: DC

## 2012-07-04 ENCOUNTER — Encounter (HOSPITAL_BASED_OUTPATIENT_CLINIC_OR_DEPARTMENT_OTHER): Payer: PRIVATE HEALTH INSURANCE | Admitting: Oncology

## 2012-07-04 ENCOUNTER — Encounter (HOSPITAL_COMMUNITY): Payer: PRIVATE HEALTH INSURANCE | Attending: Oncology

## 2012-07-04 ENCOUNTER — Ambulatory Visit (HOSPITAL_COMMUNITY)
Admission: RE | Admit: 2012-07-04 | Discharge: 2012-07-04 | Disposition: A | Discharge status: Home / Self Care | Payer: PRIVATE HEALTH INSURANCE | Source: Ambulatory Visit | Attending: Oncology | Admitting: Oncology

## 2012-07-04 ENCOUNTER — Encounter (HOSPITAL_COMMUNITY): Payer: Self-pay | Admitting: Oncology

## 2012-07-04 ENCOUNTER — Other Ambulatory Visit (HOSPITAL_COMMUNITY): Payer: Self-pay | Admitting: Oncology

## 2012-07-04 VITALS — BP 151/86 | HR 92 | Temp 98.9°F | Resp 20 | Wt 237.2 lb

## 2012-07-04 DIAGNOSIS — E876 Hypokalemia: Secondary | ICD-10-CM

## 2012-07-04 DIAGNOSIS — C9000 Multiple myeloma not having achieved remission: Secondary | ICD-10-CM

## 2012-07-04 DIAGNOSIS — G609 Hereditary and idiopathic neuropathy, unspecified: Secondary | ICD-10-CM

## 2012-07-04 DIAGNOSIS — M47817 Spondylosis without myelopathy or radiculopathy, lumbosacral region: Secondary | ICD-10-CM | POA: Insufficient documentation

## 2012-07-04 DIAGNOSIS — R7309 Other abnormal glucose: Secondary | ICD-10-CM | POA: Insufficient documentation

## 2012-07-04 DIAGNOSIS — M549 Dorsalgia, unspecified: Secondary | ICD-10-CM

## 2012-07-04 DIAGNOSIS — M546 Pain in thoracic spine: Secondary | ICD-10-CM | POA: Insufficient documentation

## 2012-07-04 DIAGNOSIS — M545 Low back pain, unspecified: Secondary | ICD-10-CM | POA: Insufficient documentation

## 2012-07-04 LAB — CBC WITH DIFFERENTIAL/PLATELET
Basophils Absolute: 0 10*3/uL (ref 0.0–0.1)
Basophils Relative: 0 % (ref 0–1)
Eosinophils Absolute: 0.4 10*3/uL (ref 0.0–0.7)
Eosinophils Relative: 6 % — ABNORMAL HIGH (ref 0–5)
HCT: 32.5 % — ABNORMAL LOW (ref 36.0–46.0)
MCH: 30.4 pg (ref 26.0–34.0)
MCHC: 31.7 g/dL (ref 30.0–36.0)
MCV: 95.9 fL (ref 78.0–100.0)
Monocytes Absolute: 0.7 10*3/uL (ref 0.1–1.0)
Neutro Abs: 4.3 10*3/uL (ref 1.7–7.7)
RDW: 16.4 % — ABNORMAL HIGH (ref 11.5–15.5)

## 2012-07-04 LAB — COMPREHENSIVE METABOLIC PANEL
AST: 11 U/L (ref 0–37)
Albumin: 3.4 g/dL — ABNORMAL LOW (ref 3.5–5.2)
Calcium: 9.7 mg/dL (ref 8.4–10.5)
Creatinine, Ser: 1.06 mg/dL (ref 0.50–1.10)

## 2012-07-04 LAB — HEMOGLOBIN A1C: Mean Plasma Glucose: 171 mg/dL — ABNORMAL HIGH (ref <117)

## 2012-07-04 LAB — LACTATE DEHYDROGENASE: LDH: 211 U/L (ref 94–250)

## 2012-07-04 MED ORDER — HEPARIN SOD (PORK) LOCK FLUSH 100 UNIT/ML IV SOLN
500.0000 [IU] | Freq: Once | INTRAVENOUS | Status: AC
  Administered 2012-07-04: 500 [IU] via INTRAVENOUS
  Filled 2012-07-04: qty 5

## 2012-07-04 MED ORDER — SODIUM CHLORIDE 0.9 % IJ SOLN
20.0000 mL | INTRAMUSCULAR | Status: DC | PRN
  Administered 2012-07-04: 20 mL via INTRAVENOUS
  Filled 2012-07-04: qty 20

## 2012-07-04 MED ORDER — HEPARIN SOD (PORK) LOCK FLUSH 100 UNIT/ML IV SOLN
INTRAVENOUS | Status: AC
  Filled 2012-07-04: qty 5

## 2012-07-04 NOTE — Progress Notes (Signed)
[name] presented for Portacath access and flush. Proper placement of portacath confirmed by CXR. Portacath located right chest wall accessed with  H 20 needle. Good blood return present.  Specimen drawn for labs. Portacath flushed with 20ml NS and 500U/73ml Heparin and needle removed intact. Procedure without incident. Patient tolerated procedure well.

## 2012-07-04 NOTE — Progress Notes (Signed)
#  1 IgG lambda multiple myeloma diagnosed initially in 1997 with MGUS. She then developed overt myeloma March 2011. She also occasionally still has an IgG kappa spike but none recently. She occasionally has kappa light chain still detectable. She is now on pomalidomide 4 mg for 21 days on and 7 days off. She remains very very stable on this. She is also on maintenance dexamethasone 20 mg every Wednesday. Unfortunately her sugars seem to be higher than they have been running in the past and she has no history of diabetes but she is thirsty at times, with a dry mouth, and urinating very frequently in spite of being off Lasix. She still has 1-2+ pitting ankle edema and knees ankles remained very tender from her peripheral neuropathy. I think her neuropathy is potentially grade 1 or 2. #2 Revlimid-induced skin toxicity given her chronic pain in both lower extremities with tenderness to even light touch over her shins and ankles. #3 peripheral neuropathy grade 1 or 2 from Velcade #4 recent stroke though she is doing very well with a nice return function and she is able to walk and speak clearly. She still her baby aspirin daily. #5 hypokalemia intermittently  Her laboratory work really is good. She is very debilitated overall comes up in a wheelchair today. She remains weak and tired. Besides is fine her ankles she has too much weight overall at 237 pounds. She is 5 foot 5 inch frame. Lungs though are clear to auscultation both anteriorly and posteriorly. Heart shows a regular rhythm and rate without obvious S3 gallop. She has no lymph nodes in her cervical or supra-clavicular areas.  I am concerned that she is developing diabetes from her dexamethasone maintenance therapy. We will check a hemoglobin A1c today. Her BUN and creatinine remain quite healthy however. Sugar is 188 today.  We'll see her in 4 weeks sooner if need be but we'll also need x-ray her back since she has not had x-rays recently but she has  chronic back pain especially the lumbar located pain which does not radiate down the legs. We will check her thoracolumbar x-rays today. She is not very active. She is much more sedentary than she needs to be. She's also drinking too much in which sugar enriched drinks. I have asked her just drink water going forward flavored with lemon or limes.  We will see her in 4 weeks She needs to continue the pomalidomide and the dexamethasone as is right now. She will add back one potassium pill a day. She will continue her polyethylene glycol for her bowels which are working very well.

## 2012-07-04 NOTE — Patient Instructions (Addendum)
[facility] Discharge Instructions  RECOMMENDATIONS MADE BY THE CONSULTANT AND ANY TEST RESULTS WILL BE SENT TO YOUR REFERRING PHYSICIAN.  EXAM FINDINGS BY THE PHYSICIAN TODAY AND SIGNS OR SYMPTOMS TO REPORT TO CLINIC OR PRIMARY PHYSICIAN: Exam and discussion by MD.  Will continue the Pomalidomide for your Myeloma and want to start back taking your potassium pills.  Take 1 daily.  Go by radiology on your way out and have xrays of your back done.  MEDICATIONS PRESCRIBED:   INSTRUCTIONS GIVEN AND DISCUSSED: Report fevers, chills, uncontrolled pain or shortness of breath.  SPECIAL INSTRUCTIONS/FOLLOW-UP: Return for blood work in 4 weeks and to see PA the following week.  Thank you for choosing [facility] to provide your oncology and hematology care.  To afford each patient quality time with our providers, please arrive at least 15 minutes before your scheduled appointment time.  With your help, our goal is to use those 15 minutes to complete the necessary work-up to ensure our physicians have the information they need to help with your evaluation and healthcare recommendations.    Effective January 1st, 2014, we ask that you re-schedule your appointment with our physicians should you arrive 10 or more minutes late for your appointment.  We strive to give you quality time with our providers, and arriving late affects you and other patients whose appointments are after yours.    Again, thank you for choosing [facility].  Our hope is that these requests will decrease the amount of time that you wait before being seen by our physicians.       _____________________________________________________________  Should you have questions after your visit to [facility], please contact our office at [phone] between the hours of 8:30 a.m. and 5:00 p.m.  Voicemails left after 4:30 p.m. will not be returned until the following business day.   For prescription refill requests, have your pharmacy contact our office with your prescription refill request.

## 2012-07-09 ENCOUNTER — Encounter: Payer: Self-pay | Admitting: Oncology

## 2012-07-09 ENCOUNTER — Other Ambulatory Visit (HOSPITAL_COMMUNITY): Payer: Self-pay | Admitting: Oncology

## 2012-07-09 DIAGNOSIS — C9000 Multiple myeloma not having achieved remission: Secondary | ICD-10-CM

## 2012-07-09 LAB — MULTIPLE MYELOMA PANEL, SERUM
Albumin ELP: 58.7 % (ref 55.8–66.1)
Beta Globulin: 5.9 % (ref 4.7–7.2)
IgA: 129 mg/dL (ref 69–380)
IgG (Immunoglobin G), Serum: 702 mg/dL (ref 690–1700)
IgM, Serum: 55 mg/dL — ABNORMAL LOW (ref 52–322)
Total Protein: 6.3 g/dL (ref 6.0–8.3)

## 2012-07-09 LAB — KAPPA/LAMBDA LIGHT CHAINS: Lambda free light chains: 3.06 mg/dL — ABNORMAL HIGH (ref 0.57–2.63)

## 2012-07-09 MED ORDER — POMALIDOMIDE 4 MG PO CAPS: ORAL_CAPSULE | ORAL | Status: DC

## 2012-07-10 ENCOUNTER — Telehealth (HOSPITAL_COMMUNITY): Payer: Self-pay | Admitting: *Deleted

## 2012-07-10 MED ORDER — GLIPIZIDE 5 MG PO TABS: 5.0000 mg | ORAL_TABLET | Freq: Two times a day (BID) | ORAL | Status: DC

## 2012-07-10 NOTE — Telephone Encounter (Signed)
Patient called to report blood glucose of 199. She will be due to take her "medicine" around 5 pm , she says it is glucotrol. She has questions about what to do if blood sugar is over a certain amt. I talked with her about dangerous levels and when to report to ED.  Will talk to dietician and diabetic educator to get them in contact with patient.

## 2012-07-11 ENCOUNTER — Telehealth (HOSPITAL_COMMUNITY): Payer: Self-pay | Admitting: Dietician

## 2012-07-11 ENCOUNTER — Other Ambulatory Visit (HOSPITAL_COMMUNITY): Payer: Self-pay | Admitting: Oncology

## 2012-07-11 DIAGNOSIS — C9000 Multiple myeloma not having achieved remission: Secondary | ICD-10-CM

## 2012-07-11 DIAGNOSIS — E876 Hypokalemia: Secondary | ICD-10-CM

## 2012-07-11 DIAGNOSIS — K5900 Constipation, unspecified: Secondary | ICD-10-CM

## 2012-07-11 DIAGNOSIS — E119 Type 2 diabetes mellitus without complications: Secondary | ICD-10-CM

## 2012-07-11 MED ORDER — POMALIDOMIDE 4 MG PO CAPS: ORAL_CAPSULE | ORAL | Status: DC

## 2012-07-11 MED ORDER — GLIPIZIDE 5 MG PO TABS: 5.0000 mg | ORAL_TABLET | Freq: Two times a day (BID) | ORAL | Status: DC

## 2012-07-11 MED ORDER — POLYETHYLENE GLYCOL 3350 17 GM/SCOOP PO POWD: 17.0000 g | Freq: Every day | ORAL | Status: DC

## 2012-07-11 MED ORDER — POTASSIUM CHLORIDE CRYS ER 20 MEQ PO TBCR: 20.0000 meq | EXTENDED_RELEASE_TABLET | Freq: Every day | ORAL | Status: DC

## 2012-07-11 MED ORDER — DEXAMETHASONE 4 MG PO TABS: 20.0000 mg | ORAL_TABLET | ORAL | Status: DC

## 2012-07-11 MED ORDER — OXYCODONE-ACETAMINOPHEN 5-325 MG PO TABS: 1.0000 | ORAL_TABLET | Freq: Four times a day (QID) | ORAL | Status: DC | PRN
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
## 2012-07-11 NOTE — Telephone Encounter (Addendum)
Received referral via [facility] from [facility] for dx: diabetes. Pt has been seen before. Last appointment was 09/06/10, for steroid induced hyperglycemia.

## 2012-07-11 NOTE — Telephone Encounter (Signed)
Tammy,  I believe I have seen this patient in the past- the name sounds familiar..  I can see her for diet education and diabetes edu. I handle all outpatient diabetes education at [facility]. I will contact her today. Thanks, [employer]

## 2012-07-23 ENCOUNTER — Telehealth (HOSPITAL_COMMUNITY): Payer: Self-pay | Admitting: *Deleted

## 2012-07-23 NOTE — Telephone Encounter (Signed)
Talked to [facility] about the death of her brother. Her daughter is very worried about her coping with everything. [name] sounds ok on the phone and knows to call us if she needs [place]. She states she has stopped her glipizide because it was ruining her appetite. Her sugars are running no higher that 130-140.

## 2012-07-24 NOTE — Telephone Encounter (Signed)
Hold glipizide until Friday.  Check glucose before each meal and keep a diary.  Call back on Friday with glucose readings and further directions

## 2012-07-25 NOTE — Telephone Encounter (Signed)
Patient's brothers funeral is today will call her tomorrow

## 2012-07-30 NOTE — Telephone Encounter (Signed)
Chart reviewed. Pt continues to struggle with multiple family issues. Will contact after 08/02/12.

## 2012-07-31 ENCOUNTER — Telehealth (HOSPITAL_COMMUNITY): Payer: Self-pay | Admitting: Oncology

## 2012-08-02 ENCOUNTER — Other Ambulatory Visit (HOSPITAL_COMMUNITY): Payer: PRIVATE HEALTH INSURANCE

## 2012-08-06 ENCOUNTER — Ambulatory Visit: Payer: PRIVATE HEALTH INSURANCE | Admitting: Urgent Care

## 2012-08-06 ENCOUNTER — Ambulatory Visit: Payer: PRIVATE HEALTH INSURANCE | Admitting: Gastroenterology

## 2012-08-07 ENCOUNTER — Ambulatory Visit (HOSPITAL_COMMUNITY): Payer: PRIVATE HEALTH INSURANCE | Admitting: Oncology

## 2012-08-08 ENCOUNTER — Other Ambulatory Visit (HOSPITAL_COMMUNITY): Payer: Self-pay | Admitting: Oncology

## 2012-08-08 DIAGNOSIS — C9000 Multiple myeloma not having achieved remission: Secondary | ICD-10-CM

## 2012-08-08 MED ORDER — POMALIDOMIDE 4 MG PO CAPS: ORAL_CAPSULE | ORAL | Status: DC

## 2012-08-14 ENCOUNTER — Other Ambulatory Visit (HOSPITAL_COMMUNITY): Payer: PRIVATE HEALTH INSURANCE

## 2012-08-20 ENCOUNTER — Encounter (HOSPITAL_COMMUNITY): Payer: PRIVATE HEALTH INSURANCE | Attending: Oncology

## 2012-08-20 DIAGNOSIS — C9000 Multiple myeloma not having achieved remission: Secondary | ICD-10-CM | POA: Insufficient documentation

## 2012-08-20 DIAGNOSIS — Z452 Encounter for adjustment and management of vascular access device: Secondary | ICD-10-CM

## 2012-08-20 LAB — COMPREHENSIVE METABOLIC PANEL
ALT: 15 U/L (ref 0–35)
Albumin: 3.4 g/dL — ABNORMAL LOW (ref 3.5–5.2)
Alkaline Phosphatase: 45 U/L (ref 39–117)
BUN: 25 mg/dL — ABNORMAL HIGH (ref 6–23)
Chloride: 105 mEq/L (ref 96–112)
Glucose, Bld: 131 mg/dL — ABNORMAL HIGH (ref 70–99)
Potassium: 4 mEq/L (ref 3.5–5.1)
Sodium: 140 mEq/L (ref 135–145)
Total Bilirubin: 0.4 mg/dL (ref 0.3–1.2)
Total Protein: 6.4 g/dL (ref 6.0–8.3)

## 2012-08-20 LAB — CBC WITH DIFFERENTIAL/PLATELET
Basophils Relative: 0 % (ref 0–1)
Eosinophils Absolute: 0.2 10*3/uL (ref 0.0–0.7)
Eosinophils Relative: 3 % (ref 0–5)
HCT: 31.3 % — ABNORMAL LOW (ref 36.0–46.0)
Hemoglobin: 10.1 g/dL — ABNORMAL LOW (ref 12.0–15.0)
Lymphs Abs: 1.4 10*3/uL (ref 0.7–4.0)
MCH: 30.7 pg (ref 26.0–34.0)
MCHC: 32.3 g/dL (ref 30.0–36.0)
MCV: 95.1 fL (ref 78.0–100.0)
Monocytes Absolute: 0.8 10*3/uL (ref 0.1–1.0)
Monocytes Relative: 14 % — ABNORMAL HIGH (ref 3–12)
Neutrophils Relative %: 59 % (ref 43–77)
RBC: 3.29 MIL/uL — ABNORMAL LOW (ref 3.87–5.11)

## 2012-08-20 LAB — LACTATE DEHYDROGENASE: LDH: 200 U/L (ref 94–250)

## 2012-08-20 MED ORDER — HEPARIN SOD (PORK) LOCK FLUSH 100 UNIT/ML IV SOLN
INTRAVENOUS | Status: AC
  Filled 2012-08-20: qty 5

## 2012-08-20 MED ORDER — HEPARIN SOD (PORK) LOCK FLUSH 100 UNIT/ML IV SOLN
500.0000 [IU] | Freq: Once | INTRAVENOUS | Status: AC
  Administered 2012-08-20: 500 [IU] via INTRAVENOUS
  Filled 2012-08-20: qty 5

## 2012-08-20 MED ORDER — SODIUM CHLORIDE 0.9 % IJ SOLN
10.0000 mL | INTRAMUSCULAR | Status: DC | PRN
  Administered 2012-08-20: 10 mL via INTRAVENOUS
  Filled 2012-08-20: qty 10

## 2012-08-20 NOTE — Progress Notes (Signed)
[name] presented for Portacath access and flush. Proper placement of portacath confirmed by CXR. Portacath located right chest wall accessed with  H 20 needle. Good blood return present. Portacath flushed with 20ml NS and 500U/66ml Heparin and needle removed intact. Procedure without incident. Patient tolerated procedure well.

## 2012-08-21 LAB — KAPPA/LAMBDA LIGHT CHAINS
Kappa free light chain: 1.24 mg/dL (ref 0.33–1.94)
Kappa, lambda light chain ratio: 0.46 (ref 0.26–1.65)
Lambda free light chains: 2.71 mg/dL — ABNORMAL HIGH (ref 0.57–2.63)

## 2012-08-22 ENCOUNTER — Ambulatory Visit (HOSPITAL_COMMUNITY): Payer: PRIVATE HEALTH INSURANCE | Admitting: Oncology

## 2012-08-22 ENCOUNTER — Other Ambulatory Visit (HOSPITAL_COMMUNITY): Payer: Self-pay | Admitting: Oncology

## 2012-08-22 ENCOUNTER — Telehealth (HOSPITAL_COMMUNITY): Payer: Self-pay

## 2012-08-22 DIAGNOSIS — C9000 Multiple myeloma not having achieved remission: Secondary | ICD-10-CM

## 2012-08-22 LAB — MULTIPLE MYELOMA PANEL, SERUM
Albumin ELP: 57.1 % (ref 55.8–66.1)
Alpha-2-Globulin: 14.5 % — ABNORMAL HIGH (ref 7.1–11.8)
Beta 2: 5.3 % (ref 3.2–6.5)
Beta Globulin: 5.9 % (ref 4.7–7.2)
IgA: 149 mg/dL (ref 69–380)
IgM, Serum: 46 mg/dL — ABNORMAL LOW (ref 52–322)
Total Protein: 6.1 g/dL (ref 6.0–8.3)

## 2012-08-22 MED ORDER — FUROSEMIDE 20 MG PO TABS: 20.0000 mg | ORAL_TABLET | ORAL | Status: DC | PRN

## 2012-08-22 NOTE — Telephone Encounter (Signed)
Patient called to inform of lab results.  She stated that she needed "fluid pills" while on the phone and Maisie Fus, [place] was notified and e-scribed them to the pharmacy for delivery.

## 2012-08-23 ENCOUNTER — Telehealth (HOSPITAL_COMMUNITY): Payer: Self-pay | Admitting: Oncology

## 2012-08-23 NOTE — Telephone Encounter (Signed)
[facility] [phone] Pc to [facility] to extend pt's auth for Pomalyst.  Per Ardeen Garland has been "grandfathered" to the 04/24/13.AUTH# [id number] CALL REF# [id number]  April Manson [facility] [phone]

## 2012-08-28 ENCOUNTER — Encounter: Payer: Self-pay | Admitting: Gastroenterology

## 2012-08-28 ENCOUNTER — Ambulatory Visit (INDEPENDENT_AMBULATORY_CARE_PROVIDER_SITE_OTHER): Payer: PRIVATE HEALTH INSURANCE | Admitting: Gastroenterology

## 2012-08-28 VITALS — BP 148/86 | HR 75 | Temp 97.4°F | Ht 64.0 in | Wt 228.0 lb

## 2012-08-28 DIAGNOSIS — K5909 Other constipation: Secondary | ICD-10-CM

## 2012-08-28 DIAGNOSIS — D126 Benign neoplasm of colon, unspecified: Secondary | ICD-10-CM

## 2012-08-28 DIAGNOSIS — K5900 Constipation, unspecified: Secondary | ICD-10-CM

## 2012-08-28 DIAGNOSIS — K219 Gastro-esophageal reflux disease without esophagitis: Secondary | ICD-10-CM

## 2012-08-28 DIAGNOSIS — C9000 Multiple myeloma not having achieved remission: Secondary | ICD-10-CM

## 2012-08-28 NOTE — Assessment & Plan Note (Signed)
Chronic constipation. She really has not used MiraLax appropriately. Long discussion with her and her daughter today regarding how MiraLax works. She was encouraged take MiraLax twice a day until she has soft BMs. Visual back down and take a dose only at bedtime all day she does not have a good bowel movement. If this regimen does not work, she will call us and at that time may consider Linzess.

## 2012-08-28 NOTE — Assessment & Plan Note (Signed)
Continue omeprazole bid. OV in one year or sooner if needed.

## 2012-08-28 NOTE — Assessment & Plan Note (Signed)
Next TCS 12/2014. Patient aware.

## 2012-08-28 NOTE — Progress Notes (Signed)
Primary Care Physician: [name], MD  Primary Gastroenterologist:  Roetta Sessions, MD   Chief Complaint  Patient presents with  . Colonoscopy    HPI: [name] is a 72 y.o. female here for three-month followup of chronic abdominal pain, constipation, GERD, gastroparesis, history of colon cancer. Last EGD normal, September 2013. Last colonoscopy in September 2013, 2 tubular adenoma removed. History of colon cancer status post partial colectomy in 1995.  Patient states that MiraLax is not working. However she also tells me she only takes it after she hasn't had a bowel movement in 3 or 4 days. Having a bowel movement only one to 2 times per week. Denies any blood in the stool or melena. Denies any recent significant abdominal pain. Heartburn is controlled on omeprazole twice a day. Rare breakthrough symptoms as long as she takes twice daily. Denies dysphagia or odynophagia.   Current Outpatient Prescriptions  Medication Sig Dispense Refill  . amLODipine (NORVASC) 5 MG tablet Take 1 tablet (5 mg total) by mouth daily.  30 tablet  2  . aspirin EC 81 MG tablet Take 81 mg by mouth daily.      . cholecalciferol (VITAMIN D) 1000 UNITS tablet Take 1,000 Units by mouth daily.      . CRESTOR 20 MG tablet Take 20 mg by mouth daily.       Marland Kitchen dexamethasone (DECADRON) 4 MG tablet Take 5 tablets (20 mg total) by mouth once a week. Wednesday  20 tablet  3  . DIOVAN 160 MG tablet Take 160 mg by mouth daily.       . diphenhydramine-acetaminophen (TYLENOL PM) 25-500 MG TABS Take 1 tablet by mouth at bedtime as needed.      . docusate sodium (COLACE) 100 MG capsule Take 1 capsule (100 mg total) by mouth 2 (two) times daily.  10 capsule  0  . furosemide (LASIX) 20 MG tablet Take 1 tablet (20 mg total) by mouth as needed. Fluid Retention  30 tablet  0  . glipiZIDE (GLUCOTROL) 5 MG tablet Take 1 tablet (5 mg total) by mouth 2 (two) times daily before a meal.  60 tablet  0  . naproxen sodium (ANAPROX)  220 MG tablet Take 220 mg by mouth 2 (two) times daily with a meal.      . nebivolol (BYSTOLIC) 5 MG tablet Take 5 mg by mouth daily.       Marland Kitchen omeprazole (PRILOSEC) 20 MG capsule Take 20 mg by mouth 2 (two) times daily.       Marland Kitchen oxybutynin (DITROPAN) 5 MG tablet Take 10 mg by mouth daily.      Marland Kitchen oxyCODONE-acetaminophen (PERCOCET/ROXICET) 5-325 MG per tablet Take 1 tablet by mouth every 6 (six) hours as needed. pain  60 tablet  0  . polyethylene glycol powder (MIRALAX) powder Take 17 g by mouth daily.  527 g  11  . pomalidomide (POMALYST) 4 MG capsule Take 1 tablet, PO daily x 21 days with 7 day respite  21 capsule  0  . potassium chloride SA (K-DUR,KLOR-CON) 20 MEQ tablet Take 1 tablet (20 mEq total) by mouth daily.  30 tablet  3  . [DISCONTINUED] oxybutynin (DITROPAN) 5 MG tablet Take 1 tablet (5 mg total) by mouth daily.  30 tablet  3   No current facility-administered medications for this visit.   Facility-Administered Medications Ordered in Other Visits  Medication Dose Route Frequency Provider Last Rate Last Dose  . sodium chloride 0.9 % injection 10  mL  10 mL Intravenous PRN Randall An, MD   10 mL at 10/26/11 1119    Allergies as of 08/28/2012 - Review Complete 08/28/2012  Allergen Reaction Noted  . Gabapentin Other (See Comments) 01/06/2011  . Simvastatin Other (See Comments)     ROS:  General: Negative for anorexia, weight loss, fever, chills, fatigue, weakness. ENT: Negative for hoarseness, difficulty swallowing , nasal congestion. CV: Negative for chest pain, angina, palpitations, dyspnea on exertion. Complains of chronic peripheral edema.  Respiratory: Negative for dyspnea at rest, dyspnea on exertion, cough, sputum, wheezing.  GI: See history of present illness. GU:  Negative for dysuria, hematuria, urinary incontinence, urinary frequency, nocturnal urination.  Endo: Negative for unusual weight change.    Physical Examination:   BP 148/86  Pulse 75  Temp(Src) 97.4  F (36.3 C) (Oral)  Ht 5\' 4"  (1.626 m)  Wt 228 lb (103.42 kg)  BMI 39.12 kg/m2  General: Well-nourished, well-developed in no acute distress.  Eyes: No icterus. Mouth: Oropharyngeal mucosa moist and pink , no lesions erythema or exudate. Lungs: Clear to auscultation bilaterally.  Heart: Regular rate and rhythm, no murmurs rubs or gallops.  Abdomen: Bowel sounds are normal, nontender, nondistended, no hepatosplenomegaly or masses, no abdominal bruits or hernia , no rebound or guarding.   Extremities: 1+ pitting edema lower extremities bilaterally. No clubbing or deformities. Neuro: Alert and oriented x 4   Skin: Warm and dry, no jaundice.   Psych: Alert and cooperative, normal mood and affect.  Labs:  Lab Results  Component Value Date   WBC 5.7 08/20/2012   HGB 10.1* 08/20/2012   HCT 31.3* 08/20/2012   MCV 95.1 08/20/2012   PLT 241 08/20/2012   Lab Results  Component Value Date   CREATININE 1.54* 08/20/2012   BUN 25* 08/20/2012   NA 140 08/20/2012   K 4.0 08/20/2012   CL 105 08/20/2012   CO2 25 08/20/2012   Lab Results  Component Value Date   ALT 15 08/20/2012   AST 11 08/20/2012   ALKPHOS 45 08/20/2012   BILITOT 0.4 08/20/2012    Imaging Studies: No results found.

## 2012-08-28 NOTE — Progress Notes (Signed)
CC PCP 

## 2012-08-28 NOTE — Patient Instructions (Addendum)
1. Take MiraLax 1 cap full twice daily until you have soft bowel movements. Then you may take 1 capful at bedtime on days that you do not have a good bowel movement. 2. Call if this regimen does not work for you. 3. Office visit in one year for followup. 4. Next colonoscopy will be due in September 2016 with Dr. Jena Gauss.

## 2012-09-08 ENCOUNTER — Encounter: Payer: Self-pay | Admitting: Oncology

## 2012-09-12 ENCOUNTER — Other Ambulatory Visit (HOSPITAL_COMMUNITY): Payer: Self-pay | Admitting: Oncology

## 2012-09-12 DIAGNOSIS — C9000 Multiple myeloma not having achieved remission: Secondary | ICD-10-CM

## 2012-09-12 MED ORDER — POMALIDOMIDE 4 MG PO CAPS: ORAL_CAPSULE | ORAL | Status: DC

## 2012-09-25 ENCOUNTER — Encounter (HOSPITAL_COMMUNITY): Payer: PRIVATE HEALTH INSURANCE | Attending: Oncology | Admitting: Oncology

## 2012-09-25 VITALS — BP 127/91 | HR 65 | Temp 98.8°F | Resp 18 | Wt 227.0 lb

## 2012-09-25 DIAGNOSIS — C9000 Multiple myeloma not having achieved remission: Secondary | ICD-10-CM | POA: Insufficient documentation

## 2012-09-25 DIAGNOSIS — Z8673 Personal history of transient ischemic attack (TIA), and cerebral infarction without residual deficits: Secondary | ICD-10-CM

## 2012-09-25 DIAGNOSIS — Z452 Encounter for adjustment and management of vascular access device: Secondary | ICD-10-CM

## 2012-09-25 LAB — COMPREHENSIVE METABOLIC PANEL
ALT: 11 U/L (ref 0–35)
AST: 10 U/L (ref 0–37)
CO2: 25 mEq/L (ref 19–32)
Chloride: 108 mEq/L (ref 96–112)
Creatinine, Ser: 1.5 mg/dL — ABNORMAL HIGH (ref 0.50–1.10)
GFR calc non Af Amer: 34 mL/min — ABNORMAL LOW (ref >90)
Sodium: 142 mEq/L (ref 135–145)
Total Bilirubin: 0.3 mg/dL (ref 0.3–1.2)

## 2012-09-25 LAB — CBC WITH DIFFERENTIAL/PLATELET
Basophils Absolute: 0 10*3/uL (ref 0.0–0.1)
HCT: 29.3 % — ABNORMAL LOW (ref 36.0–46.0)
Lymphocytes Relative: 17 % (ref 12–46)
Lymphs Abs: 1 10*3/uL (ref 0.7–4.0)
Monocytes Absolute: 0.2 10*3/uL (ref 0.1–1.0)
Neutro Abs: 4.5 10*3/uL (ref 1.7–7.7)
RBC: 3.03 MIL/uL — ABNORMAL LOW (ref 3.87–5.11)
RDW: 17.2 % — ABNORMAL HIGH (ref 11.5–15.5)
WBC: 6 10*3/uL (ref 4.0–10.5)

## 2012-09-25 MED ORDER — SODIUM CHLORIDE 0.9 % IJ SOLN
10.0000 mL | INTRAMUSCULAR | Status: DC | PRN
  Administered 2012-09-25: 10 mL via INTRAVENOUS
  Filled 2012-09-25: qty 10

## 2012-09-25 MED ORDER — DEXAMETHASONE 4 MG PO TABS: 20.0000 mg | ORAL_TABLET | ORAL | Status: DC

## 2012-09-25 MED ORDER — HEPARIN SOD (PORK) LOCK FLUSH 100 UNIT/ML IV SOLN
500.0000 [IU] | Freq: Once | INTRAVENOUS | Status: AC
  Administered 2012-09-25: 500 [IU] via INTRAVENOUS
  Filled 2012-09-25: qty 5

## 2012-09-25 MED ORDER — HEPARIN SOD (PORK) LOCK FLUSH 100 UNIT/ML IV SOLN
INTRAVENOUS | Status: AC
  Filled 2012-09-25: qty 5

## 2012-09-25 NOTE — Progress Notes (Signed)
[name] presented for Portacath access and flush. Proper placement of portacath confirmed by CXR. Portacath located right chest wall accessed with  H 20 needle. Good blood return present. Portacath flushed with 20ml NS and 500U/5ml Heparin and needle removed intact. Procedure without incident. Patient tolerated procedure well.   

## 2012-09-25 NOTE — Patient Instructions (Addendum)
[facility] Discharge Instructions  RECOMMENDATIONS MADE BY THE CONSULTANT AND ANY TEST RESULTS WILL BE SENT TO YOUR REFERRING PHYSICIAN.  EXAM FINDINGS BY THE PHYSICIAN TODAY AND SIGNS OR SYMPTOMS TO REPORT TO CLINIC OR PRIMARY PHYSICIAN: Exam and discussion by MD.  We will get a PT consult to help with ambulation and muscle strengthening.  Continue taking the Decadron every Wednesday and the Pomalyst 1 tablet for 21 days then off 7 days.  MEDICATIONS PRESCRIBED:  Decadron refills e-scribed to your pharmacy  INSTRUCTIONS GIVEN AND DISCUSSED: Report increased weakness, shortness of breath, bone pain, etc.  SPECIAL INSTRUCTIONS/FOLLOW-UP: Blood work every 4 weeks and to see PA in 2 months.  Thank you for choosing [facility] to provide your oncology and hematology care.  To afford each patient quality time with our providers, please arrive at least 15 minutes before your scheduled appointment time.  With your help, our goal is to use those 15 minutes to complete the necessary work-up to ensure our physicians have the information they need to help with your evaluation and healthcare recommendations.    Effective January 1st, 2014, we ask that you re-schedule your appointment with our physicians should you arrive 10 or more minutes late for your appointment.  We strive to give you quality time with our providers, and arriving late affects you and other patients whose appointments are after yours.    Again, thank you for choosing [facility].  Our hope is that these requests will decrease the amount of time that you wait before being seen by our physicians.       _____________________________________________________________  Should you have questions after your visit to [facility], please contact our office at [phone] between the hours of 8:30 a.m. and 5:00 p.m.  Voicemails left after 4:30 p.m. will not be returned until the  following business day.  For prescription refill requests, have your pharmacy contact our office with your prescription refill request.

## 2012-09-25 NOTE — Progress Notes (Signed)
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 She developed overt myeloma 07/16/2009 with IgG lambda M spike and she also had at one time an IgG kappa spike. As of April she has no detectable M spike. She is now on pomalidomide 21 days on 7 days off with excellent control and tolerability. She also uses 20 mg of dexamethasone on Wednesdays of every week.  We had reduced her dose of dexamethasone to this level because of issues with her sugars.  She has also had a history of a stroke with some residual weakness and left hand arm and left leg. We will try getting physical therapy to see if they can improve her strength and ambulation.  I am very impressed by her blood work. She has an intact facial symmetry. She has no left facial droop. She does have weakness of the left grip and left leg musculature compared to the right side left over from her old stroke. She is on an aspirin once a day which we will continue. She is also on Crestor and antihypertensive medications and Glucotrol for maintenance of her sugar which has improved.  On very impressed with the response from the pomalidomide. She is on 4 mg a day for 21 days on 7 days off. She is accompanied by her home CNA.  We will continue the same therapy with the pomalidomide/dexamethasone and see her back in 8 weeks but monthly for blood work.

## 2012-09-26 ENCOUNTER — Other Ambulatory Visit (HOSPITAL_COMMUNITY): Payer: Self-pay | Admitting: Oncology

## 2012-09-27 LAB — MULTIPLE MYELOMA PANEL, SERUM
Alpha-1-Globulin: 6.6 % — ABNORMAL HIGH (ref 2.9–4.9)
Alpha-2-Globulin: 15.5 % — ABNORMAL HIGH (ref 7.1–11.8)
Beta 2: 6 % (ref 3.2–6.5)
Gamma Globulin: 10.3 % — ABNORMAL LOW (ref 11.1–18.8)

## 2012-09-27 LAB — KAPPA/LAMBDA LIGHT CHAINS
Kappa free light chain: 1.32 mg/dL (ref 0.33–1.94)
Kappa, lambda light chain ratio: 0.46 (ref 0.26–1.65)

## 2012-10-01 ENCOUNTER — Other Ambulatory Visit (HOSPITAL_COMMUNITY): Payer: PRIVATE HEALTH INSURANCE

## 2012-10-08 ENCOUNTER — Other Ambulatory Visit (HOSPITAL_COMMUNITY): Payer: Self-pay | Admitting: Oncology

## 2012-10-08 DIAGNOSIS — C9000 Multiple myeloma not having achieved remission: Secondary | ICD-10-CM

## 2012-10-08 MED ORDER — POMALIDOMIDE 4 MG PO CAPS: ORAL_CAPSULE | ORAL | Status: DC

## 2012-10-24 ENCOUNTER — Ambulatory Visit (HOSPITAL_COMMUNITY)
Admission: RE | Admit: 2012-10-24 | Discharge: 2012-10-24 | Disposition: A | Discharge status: Home / Self Care | Payer: PRIVATE HEALTH INSURANCE | Source: Ambulatory Visit | Attending: Oncology | Admitting: Oncology

## 2012-10-24 DIAGNOSIS — I10 Essential (primary) hypertension: Secondary | ICD-10-CM | POA: Insufficient documentation

## 2012-10-24 DIAGNOSIS — IMO0001 Reserved for inherently not codable concepts without codable children: Secondary | ICD-10-CM | POA: Insufficient documentation

## 2012-10-24 DIAGNOSIS — R262 Difficulty in walking, not elsewhere classified: Secondary | ICD-10-CM | POA: Insufficient documentation

## 2012-10-24 DIAGNOSIS — M6281 Muscle weakness (generalized): Secondary | ICD-10-CM | POA: Insufficient documentation

## 2012-10-24 NOTE — Evaluation (Cosign Needed)
Physical Therapy Evaluation  Patient Details  Name: [name] MRN: [id number] Date of Birth: [birth year]  Today's Date: 10/24/2012 Time: 1307-1350 PT Time Calculation (min): 43 min Charges:  1 evaluation              Visit#: 1 of 8  Re-eval: 11/23/12 Assessment Diagnosis: Generalized Weakness Surgical Date:  (last year) Next MD Visit: 10/31/12  Authorization: Medicare    Authorization Time Period:    Authorization Visit#: 1 of 10   Past Medical History:  Past Medical History  Diagnosis Date  . Hypertension   . Arteriosclerotic cardiovascular disease (ASCVD)     Former patient of [facility] - [facility]CA apical LAD 2005  . Borderline diabetes     + proteinuria  . GERD (gastroesophageal reflux disease)     + constipation, gastroparesis  . Hyperlipidemia   . Female stress incontinence     H/o urinary tract infection  . Vertigo   . Insomnia   . Colon cancer     1995  . Multiple myeloma   . Osteoarthritis     cervical spine; status post surgery  . CVA (cerebral infarction) 01/2012  . Tubular adenoma of colon 01/09/12  . Gastroparesis 2009    abnl GES   Past Surgical History:  Past Surgical History  Procedure Laterality Date  . Partial colectomy  1995     [facility], colon cancer  . Abdominal hysterectomy  1970s    [facility]; secondary to metromenorrhagia  . Meniscus repair  2003    Arthroscopic, left,  [facility]  . Heel spur surgery      [facility], [facility]  . Cervical spine surgery  2004    [facility]  . Port-a-cath removal  11/05/2010    Procedure: REMOVAL PORT-A-CATH;  Surgeon: Dalia Heading;  Location: [facility];  Service: General;  Laterality: Left;  started at 0846  . Portacath placement  11/05/2010    Procedure: INSERTION PORT-A-CATH;  Surgeon: Dalia Heading;  Location: [facility];  Service: General;  Laterality: Right;  Right subclavian, device left accessed   . Colonoscopy   07/02/2007    RMR: Normal rectum status post prior sigmoid resection with normal anastomosis identified at 35 cm.  Normal  residual colon.Marland Kitchen Next TCS 06/2012  . Esophagogastroduodenoscopy    08/22/2007    WUJ:WJXBJ hiatal hernia/A circular, purplish, 1-cm, distal esophageal submucosal lesion of uncertain significance, otherwise normal esophagus. EUS -esophageal bleb (aberrant blood vessel). retained gastric contents/food  . Appendectomy  1953  . Esophagogastroduodenoscopy  01/09/12    Rourk: small HH, otherwise normal EGD  . Colonoscopy  01/09/12    Rourk: surgical anastamosis @ 25cm.  2 diminutive polyps (tubular adenomas) removed. next TCS 12/2014.   Subjective Symptoms/Limitations Pertinent History: Pt is referred to PT for generalized weakness s/p stroke with signficant hx of multiple myeloma.  She reports that over the past year she has been scared of falling and has had 1 fall.  She is not able to get up from the floor when she falls.  She has her daugther with her from 8-4 pm everyday.  After her daugther leaves she is left in her bed with a BSC nearby.  Her daugther states she helps her throughout the entire day when she is with her.  Limitations: Standing;Walking How long can you sit comfortably?: difficulty with sit to stand How long can you stand comfortably?: 5 minutes How long can you walk comfortably?: 2 minutes Patient Stated Goals: I want to be able to do  things a little better.   Pain Assessment Currently in Pain?: No/denies  Precautions/Restrictions  Precautions Precaution Comments: history of cancer  Balance Screening Balance Screen Has the patient fallen in the past 6 months: Yes How many times?: 1 Has the patient had a decrease in activity level because of a fear of falling? : Yes Is the patient reluctant to leave their home because of a fear of falling? : No  Prior Functioning  Prior Function Comments: Enjoys going to [employer] with her daughter.  Uses a scooter  Sensation/Coordination/Flexibility/Functional Tests Functional Tests Functional Tests: Activity specific balance confidence  scale: 62%  RLE Strength RLE Overall Strength Comments: taken in seated position Right Hip Flexion: 3+/5 Right Hip Extension: 3/5 Right Hip ABduction: 3+/5 Right Hip ADduction: 3+/5 Right Knee Flexion: 4/5 Right Knee Extension: 3+/5 LLE Strength LLE Overall Strength Comments: seated postion Left Hip Flexion: 3/5 Left Hip Extension: 2+/5 Left Hip ABduction: 3-/5 Left Hip ADduction: 3-/5 Left Knee Flexion: 3+/5 Left Knee Extension: 3+/5  Mobility/Balance  Ambulation/Gait Ambulation/Gait: Yes Ambulation Distance (Feet): 59 Feet (2 min) Assistive device: Rolling walker Gait Pattern: Trunk flexed Berg Balance Test Sit to Stand: Able to stand  independently using hands Standing Unsupported: Able to stand 2 minutes with supervision Sitting with Back Unsupported but Feet Supported on Floor or Stool: Able to sit safely and securely 2 minutes Stand to Sit: Controls descent by using hands Transfers: Able to transfer safely, definite need of hands Standing Unsupported with Eyes Closed: Able to stand 10 seconds with supervision Standing Ubsupported with Feet Together: Needs help to attain position but able to stand for 30 seconds with feet together From Standing, Reach Forward with Outstretched Arm: Can reach forward >5 cm safely (2") From Standing Position, Pick up Object from Floor: Able to pick up shoe, needs supervision From Standing Position, Turn to Look Behind Over each Shoulder: Turn sideways only but maintains balance Turn 360 Degrees: Able to turn 360 degrees safely but slowly Standing Unsupported, Alternately Place Feet on Step/Stool: Able to complete >2 steps/needs minimal assist Standing Unsupported, One Foot in Front: Able to take small step independently and hold 30 seconds Standing on One Leg: Unable to try or needs assist to prevent fall Total Score: 32    Physical Therapy Assessment and Plan PT Assessment and Plan Clinical Impression Statement: Pt is a 72 year old  female referred to PT for generalized weakness s/p stroke approxiamatly 1 year ago with impairments below.  Pt has a significant PMH of multiple myeloma which has been reported by family is in remission. Pt will benefit from skilled therapeutic intervention in order to improve on the following deficits: Decreased balance;Decreased activity tolerance;Decreased strength;Difficulty walking Rehab Potential: Good PT Frequency: Min 2X/week PT Duration: 6 weeks PT Treatment/Interventions: Gait training;Stair training;Functional mobility training;Therapeutic activities;Therapeutic exercise;Balance training;Neuromuscular re-education;Patient/family education PT Plan: Continue with otoga balance program to improve balance and LE strength (squats, heel and toe raises, knee flexion) progress towards stair training.  Continue to encourage pt and improve balance to decrease burden of care on daugther.     Goals Home Exercise Program Pt/caregiver will Perform Home Exercise Program: with written HEP provided;with Supervision PT Short Term Goals Time to Complete Short Term Goals: 3 weeks PT Short Term Goal 1: Pt will improve her LE strength by 1 muscle grade for greater ease with sit to stand activities.  PT Short Term Goal 2: Pt will improve her activity tolerance and ambulate x8 minutes with LRAD with min guard assist.  PT Short Term Goal 3: Pt will be educated and demonstrate falls program for safety when at home at home.  PT Long Term Goals Time to Complete Long Term Goals:  (6 weeks) PT Long Term Goal 1: Pt will improve her berg balance test to 45/56 to decrease falls risk.  PT Long Term Goal 2: Pt will improve her LE strength and core strength to [facility] in order to tolerate standing for greater than 10 minutes to complete ADL's to decrease burden of care.  Long Term Goal 3: Pt will improve her activity tolerance and ambulate with LRAD x30 minutes with supervision in order to attend grocery store.  Long Term  Goal 4: Pt will improve her ABC to greater than 80% for improved percieved fucntional ability.   Problem List Patient Active Problem List   Diagnosis Date Noted  . Muscle weakness (generalized) 10/24/2012  . Difficulty in walking(719.7) 10/24/2012  . Chronic constipation 08/28/2012  . Tubular adenoma of colon 05/08/2012  . Cerebrovascular disease 03/12/2012  . Hypertension   . Arteriosclerotic cardiovascular disease (ASCVD)   . Borderline diabetes   . GERD (gastroesophageal reflux disease)   . Hyperlipidemia   . Obesity, Class II, BMI 35-39.9, with comorbidity 02/10/2012  . Anemia, normocytic normochromic 12/15/2011  . MULTIPLE  MYELOMA 03/28/2008    PT Plan of Care PT Home Exercise Plan: see scanned report PT Patient Instructions: importance of HEP, POC answered questions about PT Consulted and Agree with Plan of Care: Patient;Family member/caregiver Family Member Consulted: daugther (Evette)  Billy Turvey, MPT, ATC 10/24/2012, 2:29 PM  Physician Documentation Your signature is required to indicate approval of the treatment plan as stated above.  Please sign and either send electronically or make a copy of this report for your files and return this physician signed original.   Please mark one 1.__approve of plan  2. ___approve of plan with the following conditions.   ______________________________                                                          _____________________ Physician Signature                                                                                                             Date

## 2012-10-29 ENCOUNTER — Other Ambulatory Visit (HOSPITAL_COMMUNITY): Payer: PRIVATE HEALTH INSURANCE

## 2012-10-31 ENCOUNTER — Ambulatory Visit (HOSPITAL_COMMUNITY)
Admission: RE | Admit: 2012-10-31 | Discharge: 2012-10-31 | Disposition: A | Discharge status: Home / Self Care | Payer: PRIVATE HEALTH INSURANCE | Source: Ambulatory Visit | Attending: Family Medicine | Admitting: Family Medicine

## 2012-10-31 ENCOUNTER — Encounter (HOSPITAL_COMMUNITY): Payer: PRIVATE HEALTH INSURANCE | Attending: Oncology

## 2012-10-31 DIAGNOSIS — M6281 Muscle weakness (generalized): Secondary | ICD-10-CM

## 2012-10-31 DIAGNOSIS — R262 Difficulty in walking, not elsewhere classified: Secondary | ICD-10-CM

## 2012-10-31 DIAGNOSIS — C9000 Multiple myeloma not having achieved remission: Secondary | ICD-10-CM | POA: Insufficient documentation

## 2012-10-31 LAB — COMPREHENSIVE METABOLIC PANEL
AST: 11 U/L (ref 0–37)
BUN: 19 mg/dL (ref 6–23)
CO2: 26 mEq/L (ref 19–32)
Calcium: 9.5 mg/dL (ref 8.4–10.5)
Creatinine, Ser: 1.29 mg/dL — ABNORMAL HIGH (ref 0.50–1.10)
GFR calc Af Amer: 47 mL/min — ABNORMAL LOW (ref >90)
GFR calc non Af Amer: 41 mL/min — ABNORMAL LOW (ref >90)
Glucose, Bld: 189 mg/dL — ABNORMAL HIGH (ref 70–99)
Total Bilirubin: 0.4 mg/dL (ref 0.3–1.2)

## 2012-10-31 LAB — CBC WITH DIFFERENTIAL/PLATELET
Basophils Absolute: 0 10*3/uL (ref 0.0–0.1)
Eosinophils Relative: 7 % — ABNORMAL HIGH (ref 0–5)
HCT: 29.3 % — ABNORMAL LOW (ref 36.0–46.0)
Hemoglobin: 9.3 g/dL — ABNORMAL LOW (ref 12.0–15.0)
Lymphocytes Relative: 27 % (ref 12–46)
MCHC: 31.7 g/dL (ref 30.0–36.0)
MCV: 96.7 fL (ref 78.0–100.0)
Monocytes Absolute: 0.5 10*3/uL (ref 0.1–1.0)
Monocytes Relative: 13 % — ABNORMAL HIGH (ref 3–12)
RDW: 17.3 % — ABNORMAL HIGH (ref 11.5–15.5)
WBC: 4 10*3/uL (ref 4.0–10.5)

## 2012-10-31 LAB — LACTATE DEHYDROGENASE: LDH: 181 U/L (ref 94–250)

## 2012-10-31 LAB — SEDIMENTATION RATE: Sed Rate: 65 mm/hr — ABNORMAL HIGH (ref 0–22)

## 2012-10-31 MED ORDER — SODIUM CHLORIDE 0.9 % IJ SOLN
10.0000 mL | INTRAMUSCULAR | Status: DC | PRN
  Administered 2012-10-31: 10 mL via INTRAVENOUS
  Filled 2012-10-31: qty 10

## 2012-10-31 MED ORDER — HEPARIN SOD (PORK) LOCK FLUSH 100 UNIT/ML IV SOLN
INTRAVENOUS | Status: AC
  Filled 2012-10-31: qty 5

## 2012-10-31 MED ORDER — HEPARIN SOD (PORK) LOCK FLUSH 100 UNIT/ML IV SOLN
500.0000 [IU] | Freq: Once | INTRAVENOUS | Status: AC
  Administered 2012-10-31: 500 [IU] via INTRAVENOUS
  Filled 2012-10-31: qty 5

## 2012-10-31 NOTE — Progress Notes (Signed)
[name] presented for Portacath access and flush.  Proper placement of portacath confirmed by CXR.  Portacath located right chest wall accessed with  H 20 needle.  No blood return and Portacath flushed with 30ml NS and 500U/75ml Heparin and needle removed intact.  Labs drawn by venipuncture to left AC by A. Nance.  Procedure tolerated well and without incident.

## 2012-10-31 NOTE — Progress Notes (Signed)
Physical Therapy Treatment Patient Details  Name: [name] MRN: [id number] Date of Birth: 1940/09/12  Today's Date: 10/31/2012 Time: 1020- 1100  PT Time calculation: 40 minutes Charges TE: 1020-1100    Visit#: 2 of 8  Re-eval: 11/23/12    Authorization: Medicare  Authorization Time Period:    Authorization Visit#: 2 of 10   Subjective: Symptoms/Limitations Pertinent History: Reports that she is doing her leg lifts at home. She comes in with RW trying to get better Pain Assessment Currently in Pain?: No/denies  Precautions/Restrictions     Exercise/Treatments Aerobic Stationary Bike: NuStep: 5 minutes level surface resistance 1 for activity tolerance SPM > 60 Standing Heel Raises: 10 reps;Limitations Heel Raises Limitations: toe raises 10 reps Knee Flexion: Both;10 reps;Limitations Knee Flexion Limitations: 3# RLE only Functional Squat: 10 reps;Limitations Functional Squat Limitations: PT facilitation Other Standing Knee Exercises: Hip abduction and extension BLE x10 each Seated Long Arc Quad: Both;10 reps;Weights Long Arc Quad Weight: 3 lbs. Other Seated Knee Exercises: hip adduction 10x10 sec holds Standing Standing Eyes Opened: Narrow base of support (BOS);Solid surface;Head turns;3 reps;30 secs;Limitations Standing Eyes Opened Limitations: 1 rep static, 1 rep head turns, 1 reps shoulder flexion Standing Eyes Closed: Narrow base of support (BOS);Solid surface;1 rep;30 secs      Physical Therapy Assessment and Plan PT Assessment and Plan Clinical Impression Statement: Tx focused on demonstration and instruction for HEP. Pt able to complete all exercises with 2 rest breaks.  requires max faciliations for proper LE alingment and positioning during standing activiites.  Demonstrates greatest weakness to LLE. Comes in today with her RW and explains during treatment her motivation to improve  PT Plan: Continue with otoga balance program.  (side stepping, retro  gait, partial tandem gait).  Continue to progress strengthening.     Goals    Problem List Patient Active Problem List   Diagnosis Date Noted  . Muscle weakness (generalized) 10/24/2012  . Difficulty in walking(719.7) 10/24/2012  . Chronic constipation 08/28/2012  . Tubular adenoma of colon 05/08/2012  . Cerebrovascular disease 03/12/2012  . Hypertension   . Arteriosclerotic cardiovascular disease (ASCVD)   . Borderline diabetes   . GERD (gastroesophageal reflux disease)   . Hyperlipidemia   . Obesity, Class II, BMI 35-39.9, with comorbidity 02/10/2012  . Anemia, normocytic normochromic 12/15/2011  . MULTIPLE  MYELOMA 03/28/2008    PT Plan of Care PT Home Exercise Plan: demonstrated HEP Consulted and Agree with Plan of Care: Patient;Family member/caregiver Family Member Consulted: daugther [employer]omatic Services operational officer)  GP    Nivek Powley, MPT, ATC 10/31/2012, 11:04 AM

## 2012-11-01 LAB — C-REACTIVE PROTEIN: CRP: 1 mg/dL — ABNORMAL HIGH (ref <0.60)

## 2012-11-01 LAB — KAPPA/LAMBDA LIGHT CHAINS: Kappa, lambda light chain ratio: 0.35 (ref 0.26–1.65)

## 2012-11-02 ENCOUNTER — Other Ambulatory Visit (HOSPITAL_COMMUNITY): Payer: Self-pay | Admitting: Oncology

## 2012-11-02 ENCOUNTER — Ambulatory Visit (HOSPITAL_COMMUNITY)
Admission: RE | Admit: 2012-11-02 | Discharge: 2012-11-02 | Disposition: A | Discharge status: Home / Self Care | Payer: PRIVATE HEALTH INSURANCE | Source: Ambulatory Visit | Attending: Family Medicine | Admitting: Family Medicine

## 2012-11-02 DIAGNOSIS — C9000 Multiple myeloma not having achieved remission: Secondary | ICD-10-CM

## 2012-11-02 DIAGNOSIS — R262 Difficulty in walking, not elsewhere classified: Secondary | ICD-10-CM

## 2012-11-02 DIAGNOSIS — M6281 Muscle weakness (generalized): Secondary | ICD-10-CM

## 2012-11-02 MED ORDER — POMALIDOMIDE 4 MG PO CAPS: ORAL_CAPSULE | ORAL | Status: DC

## 2012-11-02 NOTE — Progress Notes (Signed)
Physical Therapy Treatment Patient Details  Name: [name] MRN: [id number] Date of Birth: 09-20-1940  Today's Date: 11/02/2012 Time: 1022-1109 PT Time Calculation (min): 47 min Charge: NMR 18' 1022-1040; TE 29' 1041-1109  Visi#: 3 of 8  Re-eval: 11/23/12 Assessment Diagnosis: Generalized Weakness Next MD Visit: 10/31/12  Authorization: Medicare  Authorization Time Period:    Authorization Visit#: 3 of 10   Subjective: Symptoms/Limitations Symptoms: Feeling good, no complaints, has been doing HEP. Pain Assessment Currently in Pain?: No/denies  Precautions/Restrictions  Precautions Precaution Comments: history of cancer  Exercise/Treatments Aerobic Stationary Bike: NuStep: 8 minutes level surface resistance 1 for activity tolerance SPM > 60 Standing Heel Raises: 10 reps;Limitations Heel Raises Limitations: toe raises 10 reps Knee Flexion: Both;10 reps;Limitations Knee Flexion Limitations: 3# RLE only Functional Squat: 10 reps Other Standing Knee Exercises: Side stepping and retro gait 1RT Other Standing Knee Exercises: partial tandem stance 2x 30" Seated Long Arc Quad: Both;10 reps;Weights Long Arc Quad Weight: 3 lbs. Other Seated Knee Exercises: hip adduction 10x10 sec holds     Physical Therapy Assessment and Plan PT Assessment and Plan Clinical Impression Statement: Treatment focus on balance training and improving spatial awareness to assist with LOB episodes.  Added tandem stance, retro gait and side stepping, miin assistance required with tandem stance and cueing to improve proprioception with foot placement to assist with balance.  Pt limited by fatgiue and did require several rest breaks.  Pt demonstrate greatest weakness with Lt LE. PT Plan: Continue with otoga balance program.  Continue to progress strengthening.     Goals    Problem List Patient Active Problem List   Diagnosis Date Noted  . Muscle weakness (generalized) 10/24/2012  . Difficulty  in walking(719.7) 10/24/2012  . Chronic constipation 08/28/2012  . Tubular adenoma of colon 05/08/2012  . Cerebrovascular disease 03/12/2012  . Hypertension   . Arteriosclerotic cardiovascular disease (ASCVD)   . Borderline diabetes   . GERD (gastroesophageal reflux disease)   . Hyperlipidemia   . Obesity, Class II, BMI 35-39.9, with comorbidity 02/10/2012  . Anemia, normocytic normochromic 12/15/2011  . MULTIPLE  MYELOMA 03/28/2008       GP    Juel Burrow 11/02/2012, 12:11 PM

## 2012-11-06 ENCOUNTER — Inpatient Hospital Stay (HOSPITAL_COMMUNITY)
Admission: RE | Admit: 2012-11-06 | Payer: PRIVATE HEALTH INSURANCE | Source: Ambulatory Visit | Admitting: Physical Therapy

## 2012-11-06 ENCOUNTER — Telehealth (HOSPITAL_COMMUNITY): Payer: Self-pay

## 2012-11-08 ENCOUNTER — Ambulatory Visit (HOSPITAL_COMMUNITY)
Admission: RE | Admit: 2012-11-08 | Discharge: 2012-11-08 | Disposition: A | Discharge status: Home / Self Care | Payer: PRIVATE HEALTH INSURANCE | Source: Ambulatory Visit | Attending: Family Medicine | Admitting: Family Medicine

## 2012-11-08 DIAGNOSIS — R262 Difficulty in walking, not elsewhere classified: Secondary | ICD-10-CM

## 2012-11-08 DIAGNOSIS — M6281 Muscle weakness (generalized): Secondary | ICD-10-CM

## 2012-11-08 NOTE — Progress Notes (Signed)
Physical Therapy Treatment Patient Details  Name: [name] MRN: [id number] Date of Birth: 1940/09/28  Today's Date: 11/08/2012 Time: 1020-1056 PT Time Calculation (min): 36 min Charges:  TE: 1020-1056 Visit#: 4 of 8  Re-eval: 11/23/12    Authorization: Medicare  Authorization Time Period:    Authorization Visit#: 4 of 10   Subjective: Symptoms/Limitations Symptoms: Pt reports that her stomach was upset yesterday and was not able to do as much.  Feeling better today and still has some stomach discomfort.  Overall is more fatigued.  Pain Assessment Currently in Pain?: No/denies  Precautions/Restrictions     Exercise/Treatments Standing Heel Raises: 20 reps Forward Step Up: Both;10 reps;Hand Hold: 1;Step Height: 4" Seated Long Arc Quad: Both;Weights;15 reps Long Arc Quad Weight: 2 lbs. Supine Short Arc Quad Sets: Left;10 reps;Limitations Short Arc Quad Sets Limitations: 2# 5 sec holds Heel Slides: Both;10 reps Bridges: 10 reps;Limitations Bridges Limitations: iso hip adduction for stability Straight Leg Raises: Left;10 reps Other Supine Knee Exercises: marching alternating x15 (faciliation to LLE)     Physical Therapy Assessment and Plan PT Assessment and Plan Clinical Impression Statement: Treatment focused on improving LLE strength to improve function.  Due to recent illness limited activities. Added mat activities to improve LLE strength.   PT Plan: Continue with otoga balance program.  Continue to progress strengthening.     Goals Home Exercise Program PT Goal: Perform Home Exercise Program - Progress: Met PT Short Term Goals Time to Complete Short Term Goals: 3 weeks PT Short Term Goal 1: Pt will improve her LE strength by 1 muscle grade for greater ease with sit to stand activities.  PT Short Term Goal 1 - Progress: Progressing toward goal PT Short Term Goal 2: Pt will improve her activity tolerance and ambulate x8 minutes with LRAD with min guard  assist. PT Short Term Goal 2 - Progress: Progressing toward goal PT Short Term Goal 3: Pt will be educated and demonstrate falls program for safety when at home at home.  PT Short Term Goal 3 - Progress: Progressing toward goal PT Long Term Goals Time to Complete Long Term Goals:  (6 weeks) PT Long Term Goal 1: Pt will improve her berg balance test to 45/56 to decrease falls risk.  PT Long Term Goal 1 - Progress: Progressing toward goal PT Long Term Goal 2: Pt will improve her LE strength and core strength to [facility] in order to tolerate standing for greater than 10 minutes to complete ADL's to decrease burden of care.  PT Long Term Goal 2 - Progress: Progressing toward goal Long Term Goal 3: Pt will improve her activity tolerance and ambulate with LRAD x30 minutes with supervision in order to attend grocery store.  Long Term Goal 3 Progress: Progressing toward goal Long Term Goal 4: Pt will improve her ABC to greater than 80% for improved percieved fucntional ability.  Long Term Goal 4 Progress: Progressing toward goal  Problem List Patient Active Problem List   Diagnosis Date Noted  . Muscle weakness (generalized) 10/24/2012  . Difficulty in walking(719.7) 10/24/2012  . Chronic constipation 08/28/2012  . Tubular adenoma of colon 05/08/2012  . Cerebrovascular disease 03/12/2012  . Hypertension   . Arteriosclerotic cardiovascular disease (ASCVD)   . Borderline diabetes   . GERD (gastroesophageal reflux disease)   . Hyperlipidemia   . Obesity, Class II, BMI 35-39.9, with comorbidity 02/10/2012  . Anemia, normocytic normochromic 12/15/2011  . MULTIPLE  MYELOMA 03/28/2008    PT - End of  Session Activity Tolerance: Patient limited by fatigue PT Plan of Care PT Home Exercise Plan: provided with blue t-band  GP    Kemond Amorin, MPT, ATC 11/08/2012, 11:02 AM

## 2012-11-12 LAB — MULTIPLE MYELOMA PANEL, SERUM
Beta 2: 6.2 % (ref 3.2–6.5)
Gamma Globulin: 10.6 % — ABNORMAL LOW (ref 11.1–18.8)
IgG (Immunoglobin G), Serum: 713 mg/dL (ref 690–1700)
M-Spike, %: NOT DETECTED g/dL

## 2012-11-13 ENCOUNTER — Ambulatory Visit (HOSPITAL_COMMUNITY): Payer: PRIVATE HEALTH INSURANCE | Admitting: Physical Therapy

## 2012-11-15 ENCOUNTER — Inpatient Hospital Stay (HOSPITAL_COMMUNITY): Admission: RE | Admit: 2012-11-15 | Payer: PRIVATE HEALTH INSURANCE | Source: Ambulatory Visit

## 2012-11-20 ENCOUNTER — Inpatient Hospital Stay (HOSPITAL_COMMUNITY)
Admission: RE | Admit: 2012-11-20 | Payer: PRIVATE HEALTH INSURANCE | Source: Ambulatory Visit | Admitting: Physical Therapy

## 2012-11-22 ENCOUNTER — Ambulatory Visit (HOSPITAL_COMMUNITY): Payer: PRIVATE HEALTH INSURANCE | Admitting: Physical Therapy

## 2012-11-26 ENCOUNTER — Ambulatory Visit (HOSPITAL_COMMUNITY): Payer: PRIVATE HEALTH INSURANCE | Admitting: Oncology

## 2012-11-26 ENCOUNTER — Other Ambulatory Visit (HOSPITAL_COMMUNITY): Payer: PRIVATE HEALTH INSURANCE

## 2012-11-30 ENCOUNTER — Ambulatory Visit (HOSPITAL_COMMUNITY): Payer: PRIVATE HEALTH INSURANCE | Admitting: Oncology

## 2012-11-30 ENCOUNTER — Other Ambulatory Visit (HOSPITAL_COMMUNITY): Payer: PRIVATE HEALTH INSURANCE

## 2012-11-30 MED ORDER — POMALIDOMIDE 4 MG PO CAPS: ORAL_CAPSULE | ORAL | Status: DC

## 2012-11-30 NOTE — Progress Notes (Signed)
This encounter was created in error - please disregard.

## 2012-12-13 ENCOUNTER — Other Ambulatory Visit (HOSPITAL_COMMUNITY): Payer: PRIVATE HEALTH INSURANCE

## 2012-12-13 ENCOUNTER — Ambulatory Visit (HOSPITAL_COMMUNITY): Payer: PRIVATE HEALTH INSURANCE | Admitting: Oncology

## 2012-12-14 ENCOUNTER — Encounter (HOSPITAL_COMMUNITY): Payer: PRIVATE HEALTH INSURANCE

## 2012-12-14 ENCOUNTER — Encounter (HOSPITAL_COMMUNITY): Payer: PRIVATE HEALTH INSURANCE | Attending: Oncology | Admitting: Oncology

## 2012-12-14 VITALS — BP 132/78 | HR 58 | Temp 98.2°F | Resp 16

## 2012-12-14 DIAGNOSIS — E119 Type 2 diabetes mellitus without complications: Secondary | ICD-10-CM

## 2012-12-14 DIAGNOSIS — I10 Essential (primary) hypertension: Secondary | ICD-10-CM

## 2012-12-14 DIAGNOSIS — C9000 Multiple myeloma not having achieved remission: Secondary | ICD-10-CM

## 2012-12-14 LAB — COMPREHENSIVE METABOLIC PANEL
ALT: 8 U/L (ref 0–35)
AST: 11 U/L (ref 0–37)
CO2: 23 mEq/L (ref 19–32)
Calcium: 10.2 mg/dL (ref 8.4–10.5)
GFR calc non Af Amer: 36 mL/min — ABNORMAL LOW (ref >90)
Sodium: 137 mEq/L (ref 135–145)
Total Protein: 7.1 g/dL (ref 6.0–8.3)

## 2012-12-14 LAB — CBC WITH DIFFERENTIAL/PLATELET
Eosinophils Relative: 0 % (ref 0–5)
Lymphocytes Relative: 13 % (ref 12–46)
MCH: 29.7 pg (ref 26.0–34.0)
Monocytes Absolute: 1.6 10*3/uL — ABNORMAL HIGH (ref 0.1–1.0)
Neutrophils Relative %: 63 % (ref 43–77)
Platelets: 310 10*3/uL (ref 150–400)
RBC: 3.27 MIL/uL — ABNORMAL LOW (ref 3.87–5.11)
WBC: 6.6 10*3/uL (ref 4.0–10.5)

## 2012-12-14 MED ORDER — HEPARIN SOD (PORK) LOCK FLUSH 100 UNIT/ML IV SOLN
INTRAVENOUS | Status: AC
  Filled 2012-12-14: qty 5

## 2012-12-14 MED ORDER — HEPARIN SOD (PORK) LOCK FLUSH 100 UNIT/ML IV SOLN
500.0000 [IU] | Freq: Once | INTRAVENOUS | Status: AC
  Administered 2012-12-14: 500 [IU] via INTRAVENOUS
  Filled 2012-12-14: qty 5

## 2012-12-14 MED ORDER — SODIUM CHLORIDE 0.9 % IJ SOLN
10.0000 mL | INTRAMUSCULAR | Status: DC | PRN
  Administered 2012-12-14: 10 mL via INTRAVENOUS
  Filled 2012-12-14: qty 10

## 2012-12-14 NOTE — Patient Instructions (Addendum)
[facility] Discharge Instructions  RECOMMENDATIONS MADE BY THE CONSULTANT AND ANY TEST RESULTS WILL BE SENT TO YOUR REFERRING PHYSICIAN.  EXAM FINDINGS BY THE PHYSICIAN TODAY AND SIGNS OR SYMPTOMS TO REPORT TO CLINIC OR PRIMARY PHYSICIAN: Exam and discussion by Dellis Anes, PA-C.  You are doing well.  Will not make any changes.  MEDICATIONS PRESCRIBED:  Continue pomalyst on 21 days and off 7 days Decadron on Wednesdays.  INSTRUCTIONS GIVEN AND DISCUSSED: Report fevers, uncontrolled pain, shortness of breath.  SPECIAL INSTRUCTIONS/FOLLOW-UP: Follow-up in 6 weeks.  Thank you for choosing [facility] to provide your oncology and hematology care.  To afford each patient quality time with our providers, please arrive at least 15 minutes before your scheduled appointment time.  With your help, our goal is to use those 15 minutes to complete the necessary work-up to ensure our physicians have the information they need to help with your evaluation and healthcare recommendations.    Effective January 1st, 2014, we ask that you re-schedule your appointment with our physicians should you arrive 10 or more minutes late for your appointment.  We strive to give you quality time with our providers, and arriving late affects you and other patients whose appointments are after yours.    Again, thank you for choosing [facility].  Our hope is that these requests will decrease the amount of time that you wait before being seen by our physicians.       _____________________________________________________________  Should you have questions after your visit to [facility], please contact our office at [phone] between the hours of 8:30 a.m. and 5:00 p.m.  Voicemails left after 4:30 p.m. will not be returned until the following business day.  For prescription refill requests, have your pharmacy contact our office with your prescription refill  request.

## 2012-12-14 NOTE — Progress Notes (Signed)
[name], MD [address] [place] [address]  MULTIPLE  MYELOMA - Plan: meloxicam (MOBIC) 7.5 MG tablet, valsartan (DIOVAN) 160 MG tablet, CBC with Differential, Comprehensive metabolic panel, C-reactive protein, Multiple myeloma panel, serum, Kappa/lambda light chains, heparin lock flush 100 unit/mL, sodium chloride 0.9 % injection 10 mL, Schedule Portacath Flush Appointment, CBC with Differential, Comprehensive metabolic panel, C-reactive protein, Multiple myeloma panel, serum, Kappa/lambda light chains, CANCELED: CBC with Differential, CANCELED: Comprehensive metabolic panel, CANCELED: C-reactive protein, CANCELED: Multiple myeloma panel, serum, CANCELED: Kappa/lambda light chains  CURRENT THERAPY: Pomalyst 4 mg 21 days on and 7 days off and Dexamethasone 20 mg every Wednesday (reduced from 40 mg due to poorly controlled glucose)  INTERVAL HISTORY: [name] 72 y.o. 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 She developed overt myeloma 07/16/2009 with IgG lambda M spike and she also had at one time an IgG kappa spike. As of April she has no detectable M spike. She is now on pomalidomide 21 days on 7 days off with excellent control and tolerability. She also uses 20 mg of dexamethasone on Wednesdays of every week.  We had reduced her dose of dexamethasone to this level because of issues with her sugars.  She asks if I can be her medical doctor and I explained to her that I would be glad to care for her multiple myeloma, but she needs to have a PCP manage her other chronic conditions.  She reports that she is not entirely pleased with her present PCP, so I have asked the nurses to refer her to another PCP for medical care establishment.   She denies any complaints at this time.  I have asked her to remain as active as possible.  I recommended she increase her walking.  Her daughter reports that [name] does  not walk as much as she should.  When she is resting, I recommend LE elevation.  She shows me that she has Lasix and Aldactone Rxs.  I have asked her to favor the aldactone PRN until she establishes herself with another PCP.  I will defer management of her medications to PCP that are not MM related.   We will perform labs today and then every 6 weeks and she will continue with her chemotherapeutic regimen as outlined above.   Hematologically, she denies any complaints and ROS questioning is negative.     Past Medical History  Diagnosis Date  . Hypertension   . Arteriosclerotic cardiovascular disease (ASCVD)     Former patient of [facility] - PTCA apical LAD 2005  . Borderline diabetes     + proteinuria  . GERD (gastroesophageal reflux disease)     + constipation, gastroparesis  . Hyperlipidemia   . Female stress incontinence     H/o urinary tract infection  . Vertigo   . Insomnia   . Colon cancer     1995  . Multiple myeloma   . Osteoarthritis     cervical spine; status post surgery  . CVA (cerebral infarction) 01/2012  . Tubular adenoma of colon 01/09/12  . Gastroparesis 2009    abnl GES    has MULTIPLE  MYELOMA; Anemia, normocytic normochromic; Obesity, Class II, BMI 35-39.9, with comorbidity; Hypertension; Arteriosclerotic cardiovascular disease (ASCVD); Borderline diabetes; GERD (gastroesophageal reflux disease); Hyperlipidemia; Cerebrovascular disease; Tubular adenoma of colon; Chronic constipation; Muscle weakness (generalized); and Difficulty in walking(719.7) on her problem list.  is allergic to gabapentin and simvastatin.  Ms. Dea does not currently have medications on file.  Past Surgical History  Procedure Laterality Date  . Partial colectomy  1995     [facility], colon cancer  . Abdominal hysterectomy  1970s    [facility]; secondary to metromenorrhagia  . Meniscus repair  2003    Arthroscopic, left,  [facility]  . Heel spur surgery      [facility], [facility]  . Cervical spine surgery   2004    [facility]  . Port-a-cath removal  11/05/2010    Procedure: REMOVAL PORT-A-CATH;  Surgeon: Dalia Heading;  Location: [facility];  Service: General;  Laterality: Left;  started at 0846  . Portacath placement  11/05/2010    Procedure: INSERTION PORT-A-CATH;  Surgeon: Dalia Heading;  Location: [facility];  Service: General;  Laterality: Right;  Right subclavian, device left accessed   . Colonoscopy   07/02/2007    RMR: Normal rectum status post prior sigmoid resection with normal anastomosis identified at 35 cm.  Normal residual colon.Marland Kitchen Next TCS 06/2012  . Esophagogastroduodenoscopy    08/22/2007    WUJ:WJXBJ hiatal hernia/A circular, purplish, 1-cm, distal esophageal submucosal lesion of uncertain significance, otherwise normal esophagus. EUS -esophageal bleb (aberrant blood vessel). retained gastric contents/food  . Appendectomy  1953  . Esophagogastroduodenoscopy  01/09/12    Rourk: small HH, otherwise normal EGD  . Colonoscopy  01/09/12    Rourk: surgical anastamosis @ 25cm.  2 diminutive polyps (tubular adenomas) removed. next TCS 12/2014.    Denies any headaches, dizziness, double vision, fevers, chills, night sweats, nausea, vomiting, diarrhea, constipation, chest pain, heart palpitations, shortness of breath, blood in stool, black tarry stool, urinary pain, urinary burning, urinary frequency, hematuria.   PHYSICAL EXAMINATION  ECOG PERFORMANCE STATUS: 2 - Symptomatic, <50% confined to bed  Filed Vitals:   12/14/12 1325  BP: 132/78  Pulse: 58  Temp: 98.2 F (36.8 C)  Resp: 16    GENERAL:alert, no distress, well nourished, well developed, comfortable, cooperative, obese and smiling SKIN: skin color, texture, turgor are normal, no rashes or significant lesions HEAD: Normocephalic, No masses, lesions, tenderness or abnormalities EYES: normal, PERRLA, EOMI EARS: External ears normal OROPHARYNX:mucous membranes are moist  NECK: supple, no adenopathy, thyroid normal size, non-tender,  without nodularity, no stridor, non-tender, trachea midline LYMPH:  no palpable lymphadenopathy BREAST:right breast shows an ecchymosis from her alert one necklace LUNGS: clear to auscultation  HEART: regular rate & rhythm, no murmurs, no gallops, S1 normal and S2 normal ABDOMEN:abdomen soft, non-tender, obese, normal bowel sounds BACK: Back symmetric, no curvature. EXTREMITIES:less then 2 second capillary refill, no joint deformities, effusion, or inflammation, no skin discoloration, no clubbing, no cyanosis, positive findings:  edema B/L 2+ pitting edema, dependent.  NEURO: alert & oriented x 3 with fluent speech, no focal motor/sensory deficits, in wheelchair    LABORATORY DATA:  CBC    Component Value Date/Time   WBC 4.0 10/31/2012 1151   RBC 3.03* 10/31/2012 1151   RBC 2.97* 08/19/2011 0715   HGB 9.3* 10/31/2012 1151   HCT 29.3* 10/31/2012 1151   PLT 192 10/31/2012 1151   MCV 96.7 10/31/2012 1151   MCH 30.7 10/31/2012 1151   MCHC 31.7 10/31/2012 1151   RDW 17.3* 10/31/2012 1151   LYMPHSABS 1.1 10/31/2012 1151   MONOABS 0.5 10/31/2012 1151   EOSABS 0.3 10/31/2012 1151   BASOSABS 0.0 10/31/2012 1151      Chemistry      Component Value Date/Time  NA 140 10/31/2012 1151   K 3.7 10/31/2012 1151   CL 106 10/31/2012 1151   CO2 26 10/31/2012 1151   BUN 19 10/31/2012 1151   CREATININE 1.29* 10/31/2012 1151   CREATININE 104.89* 03/04/2010 0936      Component Value Date/Time   CALCIUM 9.5 10/31/2012 1151   CALCIUM 10.4 08/19/2011 1105   ALKPHOS 46 10/31/2012 1151   AST 11 10/31/2012 1151   ALT 10 10/31/2012 1151   BILITOT 0.4 10/31/2012 1151     Results for ORALEE, RAPAPORT (MRN [id number]) as of 12/14/2012 13:34  Ref. Range 10/31/2012 11:52  Albumin ELP Latest Range: 55.8-66.1 % 55.7 (L)  Alpha-1-Globulin Latest Range: 2.9-4.9 % 6.3 (H)  Alpha-2-Globulin Latest Range: 7.1-11.8 % 15.2 (H)  Beta Globulin Latest Range: 4.7-7.2 % 6.0  Beta 2 Latest Range: 3.2-6.5 % 6.2  Gamma Globulin Latest Range: 11.1-18.8 % 10.6 (L)    M-SPIKE, % No range found NOT DETECTED  SPE Interp. No range found (NOTE)  Comment No range found (NOTE)  IgG (Immunoglobin G), Serum Latest Range: [phone] mg/dL 782  IgA Latest Range: 69-380 mg/dL 956  IgM, Serum Latest Range: 52-322 mg/dL 42 (L)      ASSESSMENT:  1. 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 She developed overt myeloma 07/16/2009 with IgG lambda M spike and she also had at one time an IgG kappa spike. As of April she has no detectable M spike. She is now on pomalidomide 21 days on 7 days off with excellent control and tolerability. She also uses 20 mg of dexamethasone on Wednesdays of every week.  We had reduced her dose of dexamethasone to this level because of issues with her sugars. 2. History of stroke with some residual weakness in left arm and left leg. On ASA, Crestor, and antihypertensives.   Patient Active Problem List   Diagnosis Date Noted  . Muscle weakness (generalized) 10/24/2012  . Difficulty in walking(719.7) 10/24/2012  . Chronic constipation 08/28/2012  . Tubular adenoma of colon 05/08/2012  . Cerebrovascular disease 03/12/2012  . Hypertension   . Arteriosclerotic cardiovascular disease (ASCVD)   . Borderline diabetes   . GERD (gastroesophageal reflux disease)   . Hyperlipidemia   . Obesity, Class II, BMI 35-39.9, with comorbidity 02/10/2012  . Anemia, normocytic normochromic 12/15/2011  . MULTIPLE  MYELOMA 03/28/2008     PLAN:  1. I personally reviewed and went over laboratory results with the patient. 2. I personally reviewed and went over radiographic studies with the patient. 3. Continue with current anti-MM regimen on Pomalyst 4 mg 21 days on and 7 days off and Dexamethasone 20 mg every Wednesday (reduced from 40 mg due to poorly controlled glucose) 4. Labs performed today: CBC diff, CMET, CRP, MM panel 5. Labs every 6 weeks: CBC diff, CMET, CRP, MM Panel 6. Recommended Aldactone use PRN 7. Referral to PCP for  establishment of medical care.  8. Return in 12 weeks for follow-up   THERAPY PLAN:  She is tolerating therapy well, and her MM remains in a remission as of most recently.  We will continue to monitor her labs and watch for MM progression.  All questions were answered. The patient knows to call the clinic with any problems, questions or concerns. We can certainly see the patient much sooner if necessary.  Patient and plan discussed with Dr. Erline Hau and he is in agreement with the aforementioned.   Jacara Benito

## 2012-12-14 NOTE — Progress Notes (Signed)
[name] presented for Portacath access and flush. Proper placement of portacath confirmed by CXR. Portacath located right chest wall accessed with  H 20 needle. Good blood return present and blood drawn for ordered labs. Portacath flushed with 20ml NS and 500U/34ml Heparin and needle removed intact. Procedure without incident. Patient tolerated procedure well.

## 2012-12-18 LAB — KAPPA/LAMBDA LIGHT CHAINS
Kappa free light chain: 0.78 mg/dL (ref 0.33–1.94)
Kappa, lambda light chain ratio: 0.46 (ref 0.26–1.65)
Lambda free light chains: 1.69 mg/dL (ref 0.57–2.63)

## 2012-12-18 LAB — MULTIPLE MYELOMA PANEL, SERUM
Albumin ELP: 56.8 % (ref 55.8–66.1)
Alpha-1-Globulin: 6.2 % — ABNORMAL HIGH (ref 2.9–4.9)
Beta 2: 4.8 % (ref 3.2–6.5)
Beta Globulin: 6.8 % (ref 4.7–7.2)
IgM, Serum: 57 mg/dL — ABNORMAL LOW (ref 52–322)

## 2012-12-21 ENCOUNTER — Other Ambulatory Visit (HOSPITAL_COMMUNITY): Payer: PRIVATE HEALTH INSURANCE

## 2012-12-21 ENCOUNTER — Ambulatory Visit (HOSPITAL_COMMUNITY): Payer: PRIVATE HEALTH INSURANCE | Admitting: Oncology

## 2013-01-01 ENCOUNTER — Other Ambulatory Visit (HOSPITAL_COMMUNITY): Payer: Self-pay | Admitting: Oncology

## 2013-01-01 DIAGNOSIS — C9000 Multiple myeloma not having achieved remission: Secondary | ICD-10-CM

## 2013-01-01 MED ORDER — POMALIDOMIDE 4 MG PO CAPS: ORAL_CAPSULE | ORAL | Status: DC

## 2013-01-24 ENCOUNTER — Other Ambulatory Visit (HOSPITAL_COMMUNITY): Payer: Self-pay | Admitting: Family Medicine

## 2013-01-24 DIAGNOSIS — Z139 Encounter for screening, unspecified: Secondary | ICD-10-CM

## 2013-01-25 ENCOUNTER — Other Ambulatory Visit (HOSPITAL_COMMUNITY): Payer: Self-pay | Admitting: Oncology

## 2013-01-25 ENCOUNTER — Encounter (HOSPITAL_COMMUNITY): Payer: PRIVATE HEALTH INSURANCE | Attending: Oncology

## 2013-01-25 ENCOUNTER — Encounter (HOSPITAL_BASED_OUTPATIENT_CLINIC_OR_DEPARTMENT_OTHER): Payer: PRIVATE HEALTH INSURANCE | Admitting: Oncology

## 2013-01-25 DIAGNOSIS — C9000 Multiple myeloma not having achieved remission: Secondary | ICD-10-CM

## 2013-01-25 DIAGNOSIS — Z8673 Personal history of transient ischemic attack (TIA), and cerebral infarction without residual deficits: Secondary | ICD-10-CM

## 2013-01-25 DIAGNOSIS — E876 Hypokalemia: Secondary | ICD-10-CM

## 2013-01-25 DIAGNOSIS — R29898 Other symptoms and signs involving the musculoskeletal system: Secondary | ICD-10-CM

## 2013-01-25 DIAGNOSIS — I10 Essential (primary) hypertension: Secondary | ICD-10-CM | POA: Insufficient documentation

## 2013-01-25 LAB — CBC WITH DIFFERENTIAL/PLATELET
Basophils Absolute: 0 10*3/uL (ref 0.0–0.1)
Eosinophils Absolute: 0.2 10*3/uL (ref 0.0–0.7)
Lymphocytes Relative: 21 % (ref 12–46)
MCHC: 31 g/dL (ref 30.0–36.0)
Neutrophils Relative %: 54 % (ref 43–77)
RDW: 17.4 % — ABNORMAL HIGH (ref 11.5–15.5)
WBC Morphology: INCREASED

## 2013-01-25 LAB — COMPREHENSIVE METABOLIC PANEL
ALT: 9 U/L (ref 0–35)
AST: 10 U/L (ref 0–37)
Calcium: 9.5 mg/dL (ref 8.4–10.5)
Creatinine, Ser: 1.39 mg/dL — ABNORMAL HIGH (ref 0.50–1.10)
GFR calc Af Amer: 43 mL/min — ABNORMAL LOW (ref >90)
Glucose, Bld: 215 mg/dL — ABNORMAL HIGH (ref 70–99)
Sodium: 140 mEq/L (ref 135–145)
Total Protein: 6.4 g/dL (ref 6.0–8.3)

## 2013-01-25 MED ORDER — DEXAMETHASONE 4 MG PO TABS: 4.0000 mg | ORAL_TABLET | Freq: Four times a day (QID) | ORAL | Status: AC

## 2013-01-25 MED ORDER — SODIUM CHLORIDE 0.9 % IJ SOLN
10.0000 mL | INTRAMUSCULAR | Status: DC | PRN
  Administered 2013-01-25: 10 mL via INTRAVENOUS

## 2013-01-25 MED ORDER — HEPARIN SOD (PORK) LOCK FLUSH 100 UNIT/ML IV SOLN
500.0000 [IU] | Freq: Once | INTRAVENOUS | Status: AC
  Administered 2013-01-25: 500 [IU] via INTRAVENOUS

## 2013-01-25 MED ORDER — FUROSEMIDE 20 MG PO TABS: 20.0000 mg | ORAL_TABLET | ORAL | Status: DC | PRN

## 2013-01-25 MED ORDER — VALSARTAN 160 MG PO TABS: 160.0000 mg | ORAL_TABLET | Freq: Every day | ORAL | Status: DC

## 2013-01-25 MED ORDER — HEPARIN SOD (PORK) LOCK FLUSH 100 UNIT/ML IV SOLN
INTRAVENOUS | Status: AC
  Filled 2013-01-25: qty 5

## 2013-01-25 MED ORDER — POTASSIUM CHLORIDE CRYS ER 20 MEQ PO TBCR: EXTENDED_RELEASE_TABLET | ORAL | Status: DC

## 2013-01-25 NOTE — Progress Notes (Signed)
[name] presented for Portacath access and flush. Proper placement of portacath confirmed by CXR. Portacath located right chest wall accessed with  H 20 needle. Good blood return present. Portacath flushed with 20ml NS and 500U/58ml Heparin and needle removed intact. Procedure without incident. Patient tolerated procedure well.

## 2013-01-25 NOTE — Progress Notes (Signed)
Patient is seen today as a work-in.  She is here for lab appt.  She reports that "my left leg aint worth a damn.  It won't work!"  She reports that last week she got herself on an elliptical to workout/exercise.  She reports that when she was complete, her left leg got stuck on the device and she fell.  She denies any pain.  She denies any LOC.  She denies any loss of bowel or bladder control.  She denies any trauma, including head trauma.  She denies any headaches, vision changes, difficulty with speech other weakness.  No facial asymmetry noted.    She does have a history of hemorrhagic stroke in past.  She reports that when she landed, her left leg was stuck behind her with hip extension.  She reports that she was stuck in that position for 2 hours without any help available.  She reports that she was able to get herself out of that position and make her way to the couch.  Since then, her left leg has been weak.   PE: There were no vitals taken for this visit.  Gen: A and O x 3 in her usual pleasant state of mind.  NAD HEENT: No facial asymmetry Extremities: Left hand weakness (residual from previous stroke).  Left hip flexion, knee extension, and knee flexion is 2/5.  B/L LE 3+ pitting edema without any heat, pain, or erythema.  Negative Homan's Sign. Skin: Warm and dry Neuro: A and O x3.  See extremity exam for more details.   Assessment: 1. Left leg weakness.  I question stroke (which is at least 4 week old) versus cord compression (right leg is strong and no loss of bowel or bladder) versus overextension injury +/- residual weakness from stroke in past 2. Left hand weakness, residual from previous stroke. 3. MM  Plan: 1. After a long discussion regarding options, patient wishes to treat with Decadron 4 mg every 6 hours over weekend.  She will contact the clinic on Monday with an update.  Depending on her response, will scan her for cord compression Monday/Tuesday +/- MRI of brain. 2.  Will await phone call on Monday 3. Rx refill on Diovan and Lasix. 4. Labs today reveal hypokalemia and this was addressed with increased dose of Kdur.  Patient and plan discussed with Dr. Alla German and he is in agreement with the aforementioned.   Mindy Behnken

## 2013-01-28 ENCOUNTER — Telehealth (HOSPITAL_COMMUNITY): Payer: Self-pay

## 2013-01-28 ENCOUNTER — Other Ambulatory Visit (HOSPITAL_COMMUNITY): Payer: Self-pay | Admitting: Oncology

## 2013-01-28 ENCOUNTER — Telehealth (HOSPITAL_COMMUNITY): Payer: Self-pay | Admitting: Oncology

## 2013-01-28 DIAGNOSIS — R29898 Other symptoms and signs involving the musculoskeletal system: Secondary | ICD-10-CM

## 2013-01-28 LAB — KAPPA/LAMBDA LIGHT CHAINS: Kappa, lambda light chain ratio: 0.51 (ref 0.26–1.65)

## 2013-01-28 NOTE — Telephone Encounter (Signed)
Message copied by Evelena Leyden on Mon Jan 28, 2013  4:53 PM ------      Message from: Ellouise Newer      Created: Mon Jan 28, 2013  4:27 PM       CT pelvis tomorrow.  Message sent to Angie ------

## 2013-01-28 NOTE — Telephone Encounter (Signed)
Call to patient to check condition.  States " I'm still dragging my leg.  I'm using a walker but nothing is any better than it was when I came up there last week."  Information relayed to [employer], PA-C.

## 2013-01-28 NOTE — Telephone Encounter (Signed)
Patient notified that CT scan of pelvis  will be done tomorrow and she needs to be in radiology at 3:45pm.

## 2013-01-29 ENCOUNTER — Ambulatory Visit (HOSPITAL_COMMUNITY)
Admission: RE | Admit: 2013-01-29 | Discharge: 2013-01-29 | Disposition: A | Discharge status: Home / Self Care | Payer: PRIVATE HEALTH INSURANCE | Source: Ambulatory Visit | Attending: Oncology | Admitting: Oncology

## 2013-01-29 DIAGNOSIS — R29898 Other symptoms and signs involving the musculoskeletal system: Secondary | ICD-10-CM | POA: Insufficient documentation

## 2013-01-29 DIAGNOSIS — M549 Dorsalgia, unspecified: Secondary | ICD-10-CM | POA: Insufficient documentation

## 2013-01-29 DIAGNOSIS — Z9181 History of falling: Secondary | ICD-10-CM | POA: Insufficient documentation

## 2013-01-29 DIAGNOSIS — Z85038 Personal history of other malignant neoplasm of large intestine: Secondary | ICD-10-CM | POA: Insufficient documentation

## 2013-01-29 LAB — MULTIPLE MYELOMA PANEL, SERUM
Albumin ELP: 56.5 % (ref 55.8–66.1)
Alpha-1-Globulin: 5.8 % — ABNORMAL HIGH (ref 2.9–4.9)
Beta 2: 5.7 % (ref 3.2–6.5)
IgA: 155 mg/dL (ref 69–380)
IgG (Immunoglobin G), Serum: 625 mg/dL — ABNORMAL LOW (ref 690–1700)
IgM, Serum: 30 mg/dL — ABNORMAL LOW (ref 52–322)
Total Protein: 6 g/dL (ref 6.0–8.3)

## 2013-01-30 ENCOUNTER — Telehealth (HOSPITAL_COMMUNITY): Payer: Self-pay | Admitting: Oncology

## 2013-01-30 NOTE — Telephone Encounter (Signed)
She can use OTC Gas X or Beano

## 2013-01-31 ENCOUNTER — Other Ambulatory Visit (HOSPITAL_COMMUNITY): Payer: Self-pay | Admitting: Oncology

## 2013-01-31 ENCOUNTER — Telehealth (HOSPITAL_COMMUNITY): Payer: Self-pay | Admitting: Oncology

## 2013-01-31 DIAGNOSIS — C9000 Multiple myeloma not having achieved remission: Secondary | ICD-10-CM

## 2013-01-31 MED ORDER — POMALIDOMIDE 4 MG PO CAPS: ORAL_CAPSULE | ORAL | Status: DC

## 2013-01-31 NOTE — Telephone Encounter (Signed)
FAXED POMALYST RX TO [facility]

## 2013-02-04 ENCOUNTER — Telehealth (HOSPITAL_COMMUNITY): Payer: Self-pay | Admitting: Oncology

## 2013-02-04 NOTE — Telephone Encounter (Signed)
[name] called stating she has had "bad" abdominal pains x "a while now."  Reports loose stools; denies vomiting.  Denies bloody/tarry stool.  Called requesting appt to be seen to "get x-rays and something for pain."  First available appt given for 02/06/13.  Advised to report to her PCP or emergency room for worsening symptoms or concerns.

## 2013-02-06 ENCOUNTER — Encounter (HOSPITAL_BASED_OUTPATIENT_CLINIC_OR_DEPARTMENT_OTHER): Payer: PRIVATE HEALTH INSURANCE

## 2013-02-06 ENCOUNTER — Encounter (HOSPITAL_COMMUNITY): Payer: Self-pay

## 2013-02-06 VITALS — BP 147/86 | HR 68 | Temp 98.2°F | Resp 20

## 2013-02-06 DIAGNOSIS — I10 Essential (primary) hypertension: Secondary | ICD-10-CM

## 2013-02-06 DIAGNOSIS — R109 Unspecified abdominal pain: Secondary | ICD-10-CM

## 2013-02-06 DIAGNOSIS — C9000 Multiple myeloma not having achieved remission: Secondary | ICD-10-CM

## 2013-02-06 MED ORDER — VALSARTAN 160 MG PO TABS: 160.0000 mg | ORAL_TABLET | Freq: Every day | ORAL | Status: AC

## 2013-02-06 MED ORDER — OXYCODONE-ACETAMINOPHEN 5-325 MG PO TABS: 1.0000 | ORAL_TABLET | Freq: Four times a day (QID) | ORAL | Status: DC | PRN

## 2013-02-06 NOTE — Progress Notes (Signed)
[facility] OFFICE PROGRESS NOTE  [name], MD [address] [place] [address]  DIAGNOSIS: Multiple myeloma, without mention of having achieved remission(203.00)  Pain, abdominal  Chief Complaint  Patient presents with  . Anemia    multiple myeloma    CURRENT THERAPY: Pomalyst 3 weeks on one week off.  INTERVAL HISTORY: [name] 72 y.o. female returns for followup of multiple myeloma but here today emergently because of increasing abdominal discomfort. She ran out of analgesics last week and since then has had increasing discomfort involving her abdomen. Previously noted left lower extremity weakness has completely dissipated. She denies any fever, night sweats, diarrhea, constipation, nausea, vomiting, PND, orthopnea, palpitations, but with minimal peripheral paresthesias. Original diagnosis of myeloma was made in 2002.   MEDICAL HISTORY: Past Medical History  Diagnosis Date  . Hypertension   . Arteriosclerotic cardiovascular disease (ASCVD)     Former patient of [facility] - PTCA apical LAD 2005  . Borderline diabetes     + proteinuria  . GERD (gastroesophageal reflux disease)     + constipation, gastroparesis  . Hyperlipidemia   . Female stress incontinence     H/o urinary tract infection  . Vertigo   . Insomnia   . Colon cancer     1995  . Multiple myeloma   . Osteoarthritis     cervical spine; status post surgery  . CVA (cerebral infarction) 01/2012  . Tubular adenoma of colon 01/09/12  . Gastroparesis 2009    abnl GES    INTERIM HISTORY: has MULTIPLE  MYELOMA; Anemia, normocytic normochromic; Obesity, Class II, BMI 35-39.9, with comorbidity; Hypertension; Arteriosclerotic cardiovascular disease (ASCVD); Borderline diabetes; GERD (gastroesophageal reflux disease); Hyperlipidemia; Cerebrovascular disease; Tubular adenoma of colon; Chronic constipation; Muscle weakness (generalized); and Difficulty in walking(719.7) on her problem  list.    ALLERGIES:  is allergic to gabapentin and simvastatin.  MEDICATIONS: has a current medication list which includes the following prescription(s): amlodipine, aspirin ec, cholecalciferol, crestor, dexamethasone, docusate sodium, furosemide, glipizide, meloxicam, naproxen sodium, nebivolol, omeprazole, oxybutynin, oxycodone-acetaminophen, polyethylene glycol powder, pomalidomide, potassium chloride sa, valsartan, and diphenhydramine-acetaminophen, and the following Facility-Administered Medications: sodium chloride.  SURGICAL HISTORY:  Past Surgical History  Procedure Laterality Date  . Partial colectomy  1995     [facility], colon cancer  . Abdominal hysterectomy  1970s    [facility]; secondary to metromenorrhagia  . Meniscus repair  2003    Arthroscopic, left,  [facility]  . Heel spur surgery      [facility], [facility]  . Cervical spine surgery  2004    [facility]  . Port-a-cath removal  11/05/2010    Procedure: REMOVAL PORT-A-CATH;  Surgeon: Dalia Heading;  Location: [facility];  Service: General;  Laterality: Left;  started at 0846  . Portacath placement  11/05/2010    Procedure: INSERTION PORT-A-CATH;  Surgeon: Dalia Heading;  Location: [facility];  Service: General;  Laterality: Right;  Right subclavian, device left accessed   . Colonoscopy   07/02/2007    RMR: Normal rectum status post prior sigmoid resection with normal anastomosis identified at 35 cm.  Normal residual colon.Marland Kitchen Next TCS 06/2012  . Esophagogastroduodenoscopy    08/22/2007    UEA:VWUJW hiatal hernia/A circular, purplish, 1-cm, distal esophageal submucosal lesion of uncertain significance, otherwise normal esophagus. EUS -esophageal bleb (aberrant blood vessel). retained gastric contents/food  . Appendectomy  1953  . Esophagogastroduodenoscopy  01/09/12    Rourk: small HH, otherwise normal EGD  . Colonoscopy  01/09/12  Rourk: surgical anastamosis @ 25cm.  2 diminutive polyps (tubular adenomas) removed. next TCS 12/2014.    FAMILY HISTORY: family  history includes Arthritis in her daughter; Colon cancer (age of onset: 50) in her brother; Heart disease in her brother and father; Hypertension in her brother; Lung disease in her mother; Multiple sclerosis in her son; Seizures in her daughter. There is no history of Anesthesia problems, Hypotension, Malignant hyperthermia, or Pseudochol deficiency.  SOCIAL HISTORY:  reports that she quit smoking about 22 years ago. Her smoking use included Cigarettes. She has a 3.75 pack-year smoking history. She has never used smokeless tobacco. She reports that she does not drink alcohol or use illicit drugs.  REVIEW OF SYSTEMS:  Other than that discussed above is noncontributory.  PHYSICAL EXAMINATION: ECOG PERFORMANCE STATUS: 1 - Symptomatic but completely ambulatory  Blood pressure 147/86, pulse 68, temperature 98.2 F (36.8 C), temperature source Oral, resp. rate 20, SpO2 96.00%.  GENERAL:alert, no distress and comfortable SKIN: skin color, texture, turgor are normal, no rashes or significant lesions EYES: PERLA; Conjunctiva are pink and non-injected, sclera clear OROPHARYNX:no exudate, no erythema on lips, buccal mucosa, or tongue. NECK: supple, thyroid normal size, non-tender, without nodularity. No masses CHEST: Normal AP diameter with no breast masses. Life port in place. LYMPH:  no palpable lymphadenopathy in the cervical, axillary or inguinal LUNGS: clear to auscultation and percussion with normal breathing effort HEART: regular rate & rhythm and no murmurs and no lower extremity edema ABDOMEN:abdomen soft, non-tender and normal bowel sounds MUSCULOSKELETAL:no cyanosis of digits and no clubbing. Range of motion normal.  NEURO: alert & oriented x 3 with fluent speech, no focal motor/sensory deficits   LABORATORY DATA: Infusion on 01/25/2013  Component Date Value Range Status  . WBC 01/25/2013 4.3  4.0 - 10.5 K/uL Final  . RBC 01/25/2013 3.23* 3.87 - 5.11 MIL/uL Final  . Hemoglobin  01/25/2013 9.5* 12.0 - 15.0 g/dL Final  . HCT 16/01/9603 30.6* 36.0 - 46.0 % Final  . MCV 01/25/2013 94.7  78.0 - 100.0 fL Final  . MCH 01/25/2013 29.4  26.0 - 34.0 pg Final  . MCHC 01/25/2013 31.0  30.0 - 36.0 g/dL Final  . RDW 54/12/8117 17.4* 11.5 - 15.5 % Final  . Platelets 01/25/2013 195  150 - 400 K/uL Final  . Neutrophils Relative % 01/25/2013 54  43 - 77 % Final  . Lymphocytes Relative 01/25/2013 21  12 - 46 % Final  . Monocytes Relative 01/25/2013 20* 3 - 12 % Final  . Eosinophils Relative 01/25/2013 5  0 - 5 % Final  . Basophils Relative 01/25/2013 0  0 - 1 % Final  . Neutro Abs 01/25/2013 2.3  1.7 - 7.7 K/uL Final  . Lymphs Abs 01/25/2013 0.9  0.7 - 4.0 K/uL Final  . Monocytes Absolute 01/25/2013 0.9  0.1 - 1.0 K/uL Final  . Eosinophils Absolute 01/25/2013 0.2  0.0 - 0.7 K/uL Final  . Basophils Absolute 01/25/2013 0.0  0.0 - 0.1 K/uL Final  . RBC Morphology 01/25/2013 POLYCHROMASIA PRESENT   Final   Comment: RARE NRBCs                          TEARDROP CELLS  . WBC Morphology 01/25/2013 INCREASED BANDS (>20% BANDS)   Final  . Sodium 01/25/2013 140  135 - 145 mEq/L Final  . Potassium 01/25/2013 3.0* 3.5 - 5.1 mEq/L Final  . Chloride 01/25/2013 104  96 - 112 mEq/L  Final  . CO2 01/25/2013 23  19 - 32 mEq/L Final  . Glucose, Bld 01/25/2013 215* 70 - 99 mg/dL Final  . BUN 46/96/2952 19  6 - 23 mg/dL Final  . Creatinine, Ser 01/25/2013 1.39* 0.50 - 1.10 mg/dL Final  . Calcium 84/13/2440 9.5  8.4 - 10.5 mg/dL Final  . Total Protein 01/25/2013 6.4  6.0 - 8.3 g/dL Final  . Albumin 02/19/2535 3.5  3.5 - 5.2 g/dL Final  . AST 64/40/3474 10  0 - 37 U/L Final  . ALT 01/25/2013 9  0 - 35 U/L Final  . Alkaline Phosphatase 01/25/2013 39  39 - 117 U/L Final  . Total Bilirubin 01/25/2013 0.4  0.3 - 1.2 mg/dL Final  . GFR calc non Af Amer 01/25/2013 37* >90 mL/min Final  . GFR calc Af Amer 01/25/2013 43* >90 mL/min Final   Comment: (NOTE)                          The eGFR has been  calculated using the CKD EPI equation.                          This calculation has not been validated in all clinical situations.                          eGFR's persistently <90 mL/min signify possible Chronic Kidney                          Disease.  . CRP 01/25/2013 <0.5* <0.60 mg/dL Final   Performed at [facility]  . Total Protein 01/25/2013 6.0  6.0 - 8.3 g/dL Final  . Albumin ELP 25/95/6387 56.5  55.8 - 66.1 % Final  . Alpha-1-Globulin 01/25/2013 5.8* 2.9 - 4.9 % Final  . Alpha-2-Globulin 01/25/2013 14.5* 7.1 - 11.8 % Final  . Beta Globulin 01/25/2013 6.6  4.7 - 7.2 % Final  . Beta 2 01/25/2013 5.7  3.2 - 6.5 % Final  . Gamma Globulin 01/25/2013 10.9* 11.1 - 18.8 % Final  . M-Spike, % 01/25/2013 0.20   Final  . SPE Interp. 01/25/2013 (NOTE)   Final   Comment: A restricted band consistent with monoclonal protein is present.                          The monoclonal protein peak accounts for 0.20 g/dL of the total                          0.65 g/dL of protein in the gamma region.                          Results are consistent with SPE performed on 12/17/12                          Reviewed by Dallas Breeding, MD, PhD, FCAP (Electronic Signature on                          File)  . Comment 01/25/2013 (NOTE)   Final   Comment: ---------------  Serum protein electrophoresis is a useful screening procedure in the                          detection of various pathophysiologic states such as inflammation,                          gammopathies, protein loss and other dysproteinemias.  Immunofixation                          electrophoresis (IFE) is a more sensitive technique for the                          identification of M-proteins found in patients with monoclonal                          gammopathy of unknown significance (MGUS), amyloidosis, early or                          treated myeloma or macroglobulinemia, solitary plasmacytoma or                           extramedullary plasmacytoma.  . IgG (Immunoglobin G), Serum 01/25/2013 625* 690 - 1700 mg/dL Final  . IgA 62/13/0865 155  69 - 380 mg/dL Final  . IgM, Serum 78/46/9629 30* 52 - 322 mg/dL Final  . Immunofix Electr Int 01/25/2013 (NOTE)   Final   Comment: Monoclonal IgG lambda protein is present.                          Reviewed by Dallas Breeding, MD, PhD, FCAP (Electronic Signature on                          File)                          Performed at [facility]  . Kappa free light chain 01/25/2013 0.66  0.33 - 1.94 mg/dL Final  . Lamda free light chains 01/25/2013 1.30  0.57 - 2.63 mg/dL Final  . Kappa, lamda light chain ratio 01/25/2013 0.51  0.26 - 1.65 Final   Performed at [facility]    PATHOLOGY:  Urinalysis    Component Value Date/Time   COLORURINE YELLOW 06/06/2012 1151   APPEARANCEUR CLEAR 06/06/2012 1151   LABSPEC 1.025 06/06/2012 1151   PHURINE 6.0 06/06/2012 1151   GLUCOSEU 500* 06/06/2012 1151   HGBUR NEGATIVE 06/06/2012 1151   HGBUR negative 02/26/2007 0849   BILIRUBINUR NEGATIVE 06/06/2012 1151   KETONESUR TRACE* 06/06/2012 1151   PROTEINUR 30* 06/06/2012 1151   UROBILINOGEN 0.2 06/06/2012 1151   NITRITE NEGATIVE 06/06/2012 1151   LEUKOCYTESUR NEGATIVE 06/06/2012 1151    RADIOGRAPHIC STUDIES: Ct Pelvis Wo Contrast  01/29/2013   CLINICAL DATA:  Multiple falls, back pain, history of colon cancer.  EXAM: CT PELVIS WITHOUT CONTRAST  TECHNIQUE: Multidetector CT imaging of the pelvis was performed following the standard protocol without intravenous contrast.  COMPARISON:  None.  FINDINGS: No evidence of fracture or dislocation. Visualized bony pelvis appears intact.  Degenerative changes at L4-5 and L5-S1.  Bilateral hip joint spaces are symmetric.  Prior partial left hemicolectomy with anastomosis in the left pelvis (series 5/ image 40). No pelvic ascites.  No suspicious pelvic lymphadenopathy.  Atherosclerotic calcifications of the lower  abdominal aorta and iliac vessels.  Prior hysterectomy. Bilateral are unremarkable.  Bladder is within normal limits.  IMPRESSION: No fracture or dislocation is seen.   Electronically Signed   By: Charline Bills M.D.   On: 01/29/2013 16:22    ASSESSMENT: #1. Multiple myeloma, currently on maintenance treatment with Pomalyst for about the last year.  #2. 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 #3. No evidence of pelvic fracture. #4. Morbid obesity. #5. Hypertension, currently needing more medication.  PLAN: #1. Prescription for oxycodone was written along with Diovan for hypertension. #2. Followup in one week when next cycle of Pomalyst will begin.   All questions were answered. The patient knows to call the clinic with any problems, questions or concerns. We can certainly see the patient much sooner if necessary.   I spent 30 minutes counseling the patient face to face. The total time spent in the appointment was 25 minutes.    Diane Lovely, MD 02/06/2013 12:01 PM

## 2013-02-06 NOTE — Patient Instructions (Signed)
[facility] Discharge Instructions  RECOMMENDATIONS MADE BY THE CONSULTANT AND ANY TEST RESULTS WILL BE SENT TO YOUR REFERRING PHYSICIAN.  EXAM FINDINGS BY THE PHYSICIAN TODAY AND SIGNS OR SYMPTOMS TO REPORT TO CLINIC OR PRIMARY PHYSICIAN:  Pain medication refilled and given to you. Take as directed.  Diovan called into pharmacy.   Return next week as already scheduled to see MD.     Thank you for choosing [facility] to provide your oncology and hematology care.  To afford each patient quality time with our providers, please arrive at least 15 minutes before your scheduled appointment time.  With your help, our goal is to use those 15 minutes to complete the necessary work-up to ensure our physicians have the information they need to help with your evaluation and healthcare recommendations.    Effective January 1st, 2014, we ask that you re-schedule your appointment with our physicians should you arrive 10 or more minutes late for your appointment.  We strive to give you quality time with our providers, and arriving late affects you and other patients whose appointments are after yours.    Again, thank you for choosing [facility].  Our hope is that these requests will decrease the amount of time that you wait before being seen by our physicians.       _____________________________________________________________  Should you have questions after your visit to [facility], please contact our office at [phone] between the hours of 8:30 a.m. and 5:00 p.m.  Voicemails left after 4:30 p.m. will not be returned until the following business day.  For prescription refill requests, have your pharmacy contact our office with your prescription refill request.

## 2013-02-13 ENCOUNTER — Ambulatory Visit (HOSPITAL_COMMUNITY): Payer: PRIVATE HEALTH INSURANCE

## 2013-02-20 ENCOUNTER — Emergency Department (HOSPITAL_COMMUNITY)
Admission: EM | Admit: 2013-02-20 | Discharge: 2013-02-20 | Disposition: A | Discharge status: Home / Self Care | Payer: PRIVATE HEALTH INSURANCE | Attending: Emergency Medicine | Admitting: Emergency Medicine

## 2013-02-20 ENCOUNTER — Encounter (HOSPITAL_COMMUNITY): Payer: Self-pay | Admitting: Emergency Medicine

## 2013-02-20 ENCOUNTER — Emergency Department (HOSPITAL_COMMUNITY): Payer: PRIVATE HEALTH INSURANCE

## 2013-02-20 DIAGNOSIS — R079 Chest pain, unspecified: Secondary | ICD-10-CM

## 2013-02-20 DIAGNOSIS — M1990 Unspecified osteoarthritis, unspecified site: Secondary | ICD-10-CM | POA: Insufficient documentation

## 2013-02-20 DIAGNOSIS — Z79899 Other long term (current) drug therapy: Secondary | ICD-10-CM | POA: Insufficient documentation

## 2013-02-20 DIAGNOSIS — I10 Essential (primary) hypertension: Secondary | ICD-10-CM | POA: Insufficient documentation

## 2013-02-20 DIAGNOSIS — Z85038 Personal history of other malignant neoplasm of large intestine: Secondary | ICD-10-CM | POA: Insufficient documentation

## 2013-02-20 DIAGNOSIS — Z8673 Personal history of transient ischemic attack (TIA), and cerebral infarction without residual deficits: Secondary | ICD-10-CM | POA: Insufficient documentation

## 2013-02-20 DIAGNOSIS — Z791 Long term (current) use of non-steroidal anti-inflammatories (NSAID): Secondary | ICD-10-CM | POA: Insufficient documentation

## 2013-02-20 DIAGNOSIS — R0789 Other chest pain: Secondary | ICD-10-CM | POA: Insufficient documentation

## 2013-02-20 DIAGNOSIS — Z862 Personal history of diseases of the blood and blood-forming organs and certain disorders involving the immune mechanism: Secondary | ICD-10-CM | POA: Insufficient documentation

## 2013-02-20 DIAGNOSIS — K219 Gastro-esophageal reflux disease without esophagitis: Secondary | ICD-10-CM | POA: Insufficient documentation

## 2013-02-20 DIAGNOSIS — Z8639 Personal history of other endocrine, nutritional and metabolic disease: Secondary | ICD-10-CM | POA: Insufficient documentation

## 2013-02-20 DIAGNOSIS — R0602 Shortness of breath: Secondary | ICD-10-CM | POA: Insufficient documentation

## 2013-02-20 DIAGNOSIS — Z8742 Personal history of other diseases of the female genital tract: Secondary | ICD-10-CM | POA: Insufficient documentation

## 2013-02-20 DIAGNOSIS — Z87891 Personal history of nicotine dependence: Secondary | ICD-10-CM | POA: Insufficient documentation

## 2013-02-20 DIAGNOSIS — Z7982 Long term (current) use of aspirin: Secondary | ICD-10-CM | POA: Insufficient documentation

## 2013-02-20 LAB — CBC WITH DIFFERENTIAL/PLATELET
Basophils Absolute: 0 10*3/uL (ref 0.0–0.1)
Eosinophils Absolute: 0.4 10*3/uL (ref 0.0–0.7)
Hemoglobin: 9.2 g/dL — ABNORMAL LOW (ref 12.0–15.0)
Lymphocytes Relative: 17 % (ref 12–46)
Lymphs Abs: 1 10*3/uL (ref 0.7–4.0)
MCH: 29.1 pg (ref 26.0–34.0)
Neutro Abs: 3.5 10*3/uL (ref 1.7–7.7)
Neutrophils Relative %: 61 % (ref 43–77)
Platelets: 182 10*3/uL (ref 150–400)
RBC: 3.16 MIL/uL — ABNORMAL LOW (ref 3.87–5.11)
RDW: 17 % — ABNORMAL HIGH (ref 11.5–15.5)
WBC: 5.7 10*3/uL (ref 4.0–10.5)

## 2013-02-20 LAB — BASIC METABOLIC PANEL
Chloride: 104 mEq/L (ref 96–112)
GFR calc Af Amer: 53 mL/min — ABNORMAL LOW (ref >90)
GFR calc non Af Amer: 46 mL/min — ABNORMAL LOW (ref >90)
Glucose, Bld: 157 mg/dL — ABNORMAL HIGH (ref 70–99)
Potassium: 3.5 mEq/L (ref 3.5–5.1)
Sodium: 141 mEq/L (ref 135–145)

## 2013-02-20 LAB — HEPATIC FUNCTION PANEL
AST: 8 U/L (ref 0–37)
Albumin: 2.8 g/dL — ABNORMAL LOW (ref 3.5–5.2)
Alkaline Phosphatase: 45 U/L (ref 39–117)
Total Bilirubin: 0.5 mg/dL (ref 0.3–1.2)
Total Protein: 5.4 g/dL — ABNORMAL LOW (ref 6.0–8.3)

## 2013-02-20 LAB — D-DIMER, QUANTITATIVE: D-Dimer, Quant: 0.68 ug/mL-FEU — ABNORMAL HIGH (ref 0.00–0.48)

## 2013-02-20 LAB — TROPONIN I: Troponin I: <0.3 ng/mL (ref <0.30)

## 2013-02-20 MED ORDER — HEPARIN SOD (PORK) LOCK FLUSH 100 UNIT/ML IV SOLN
500.0000 [IU] | Freq: Once | INTRAVENOUS | Status: AC
  Administered 2013-02-20: 500 [IU] via INTRAVENOUS
  Filled 2013-02-20: qty 5

## 2013-02-20 MED ORDER — LORAZEPAM 1 MG PO TABS: 1.0000 mg | ORAL_TABLET | Freq: Once | ORAL | Status: DC

## 2013-02-20 MED ORDER — IOHEXOL 350 MG/ML SOLN
100.0000 mL | Freq: Once | INTRAVENOUS | Status: AC | PRN
  Administered 2013-02-20: 100 mL via INTRAVENOUS

## 2013-02-20 MED ORDER — HYDROMORPHONE HCL 4 MG PO TABS: 4.0000 mg | ORAL_TABLET | Freq: Four times a day (QID) | ORAL | Status: DC | PRN

## 2013-02-20 NOTE — ED Notes (Signed)
Confirmed with pt that her port is a power port.  Family is at the bedside.  Gave pt crackers and peanut butter as she was hungry

## 2013-02-20 NOTE — ED Notes (Signed)
Patient is resting comfortably. 

## 2013-02-20 NOTE — ED Notes (Signed)
Pt reports intermittent sharp pain in r side of chest go through back and radiating down r leg.  C/o tingling in r leg.  Also c/o SOB.  Reports pain started 2 days ago.

## 2013-02-20 NOTE — ED Notes (Signed)
I was given in report that pt port will not allow for blood to be drawn back.  Attempted myself and was not able to flush or draw back blood.  20g IV placed for CTA.  Explained this to pt

## 2013-02-20 NOTE — ED Notes (Signed)
Port accessed by Miquel Dunn RN. Unable to aspirate blood from port. Pt reports " they have trouble getting blood sometimes" Pam with lab aware of unable to obtain blood.

## 2013-02-20 NOTE — ED Provider Notes (Signed)
CSN: [id number]     Arrival date & time 02/20/13  4098 History  This chart was scribed for [name], MD by Quintella Reichert, ED scribe.  This patient was seen in room APA10/APA10 and the patient's care was started at 9:08 AM.   Chief Complaint  Patient presents with  . Chest Pain    Patient is a 72 y.o. female presenting with chest pain. The history is provided by the patient. No language interpreter was used.  Chest Pain Pain location:  R chest Pain quality: sharp   Pain severity:  Severe Timing:  Intermittent Chronicity:  New Associated symptoms: shortness of breath   Associated symptoms: no abdominal pain, no back pain, no cough, no diaphoresis, no fatigue and no headache   Risk factors: diabetes mellitus (borderline), high cholesterol and hypertension   Risk factors comment:  ASCVD   HPI Comments: [name] is a 72 y.o. female who presents to the Emergency Department complaining of 2 days of intermittent sharp severe right-sided chest pain with associated SOB.  Pt denies diaphoresis.   She denies prior h/o similar symptoms.  She denies abdominal pain.  She also notes some bilateral leg swelling which is chronic and at baseline.    Past Medical History  Diagnosis Date  . Hypertension   . Arteriosclerotic cardiovascular disease (ASCVD)     Former patient of [facility] - PTCA apical LAD 2005  . Borderline diabetes     + proteinuria  . GERD (gastroesophageal reflux disease)     + constipation, gastroparesis  . Hyperlipidemia   . Female stress incontinence     H/o urinary tract infection  . Vertigo   . Insomnia   . Colon cancer     1995  . Multiple myeloma   . Osteoarthritis     cervical spine; status post surgery  . CVA (cerebral infarction) 01/2012  . Tubular adenoma of colon 01/09/12  . Gastroparesis 2009    abnl GES    Past Surgical History  Procedure Laterality Date  . Partial colectomy  1995     [facility], colon cancer  . Abdominal hysterectomy  1970s     [facility]; secondary to metromenorrhagia  . Meniscus repair  2003    Arthroscopic, left,  [facility]  . Heel spur surgery      [facility], [facility]  . Cervical spine surgery  2004    [facility]  . Port-a-cath removal  11/05/2010    Procedure: REMOVAL PORT-A-CATH;  Surgeon: Dalia Heading;  Location: [facility];  Service: General;  Laterality: Left;  started at 0846  . Portacath placement  11/05/2010    Procedure: INSERTION PORT-A-CATH;  Surgeon: Dalia Heading;  Location: [facility];  Service: General;  Laterality: Right;  Right subclavian, device left accessed   . Colonoscopy   07/02/2007    RMR: Normal rectum status post prior sigmoid resection with normal anastomosis identified at 35 cm.  Normal residual colon.Marland Kitchen Next TCS 06/2012  . Esophagogastroduodenoscopy    08/22/2007    JXB:JYNWG hiatal hernia/A circular, purplish, 1-cm, distal esophageal submucosal lesion of uncertain significance, otherwise normal esophagus. EUS -esophageal bleb (aberrant blood vessel). retained gastric contents/food  . Appendectomy  1953  . Esophagogastroduodenoscopy  01/09/12    Rourk: small HH, otherwise normal EGD  . Colonoscopy  01/09/12    Rourk: surgical anastamosis @ 25cm.  2 diminutive polyps (tubular adenomas) removed. next TCS 12/2014.    Family History  Problem Relation Age of Onset  . Anesthesia problems  Neg Hx   . Hypotension Neg Hx   . Malignant hyperthermia Neg Hx   . Pseudochol deficiency Neg Hx   . Lung disease Mother   . Heart disease Father   . Heart disease Brother   . Hypertension Brother   . Arthritis Daughter   . Seizures Daughter   . Multiple sclerosis Son   . Colon cancer Brother 12    History  Substance Use Topics  . Smoking status: Former Smoker -- 0.25 packs/day for 15 years    Types: Cigarettes    Quit date: 11/03/1990  . Smokeless tobacco: Never Used  . Alcohol Use: No    OB History   Grav Para Term Preterm Abortions TAB SAB Ect Mult Living                  Review of Systems  Constitutional:  Negative for diaphoresis, appetite change and fatigue.  HENT: Negative for congestion, ear discharge and sinus pressure.   Eyes: Negative for discharge.  Respiratory: Positive for shortness of breath. Negative for cough.   Cardiovascular: Positive for chest pain.  Gastrointestinal: Negative for abdominal pain and diarrhea.  Genitourinary: Negative for frequency and hematuria.  Musculoskeletal: Negative for back pain.  Skin: Negative for rash.  Neurological: Negative for seizures and headaches.  Psychiatric/Behavioral: Negative for hallucinations.     Allergies  Gabapentin and Simvastatin  Home Medications   Current Outpatient Rx  Name  Route  Sig  Dispense  Refill  . amLODipine (NORVASC) 5 MG tablet   Oral   Take 1 tablet (5 mg total) by mouth daily.   30 tablet   2   . aspirin EC 81 MG tablet   Oral   Take 81 mg by mouth daily.         . cholecalciferol (VITAMIN D) 1000 UNITS tablet   Oral   Take 1,000 Units by mouth daily.         . CRESTOR 20 MG tablet   Oral   Take 20 mg by mouth daily.          Marland Kitchen dexamethasone (DECADRON) 4 MG tablet   Oral   Take 5 tablets (20 mg total) by mouth once a week. Wednesday   20 tablet   3   . diphenhydramine-acetaminophen (TYLENOL PM) 25-500 MG TABS   Oral   Take 1 tablet by mouth at bedtime as needed.         . docusate sodium (COLACE) 100 MG capsule   Oral   Take 1 capsule (100 mg total) by mouth 2 (two) times daily.   10 capsule   0   . furosemide (LASIX) 20 MG tablet   Oral   Take 1 tablet (20 mg total) by mouth as needed. Fluid Retention   30 tablet   1     Please deliver   . glipiZIDE (GLUCOTROL) 5 MG tablet   Oral   Take 1 tablet (5 mg total) by mouth 2 (two) times daily before a meal.   60 tablet   0     Started on 07/06/2012   . meloxicam (MOBIC) 7.5 MG tablet               . naproxen sodium (ANAPROX) 220 MG tablet   Oral   Take 220 mg by mouth 2 (two) times daily with a meal.          . nebivolol (BYSTOLIC) 5 MG tablet   Oral   Take  5 mg by mouth daily.          Marland Kitchen omeprazole (PRILOSEC) 20 MG capsule   Oral   Take 20 mg by mouth 2 (two) times daily.          Marland Kitchen oxybutynin (DITROPAN) 5 MG tablet   Oral   Take 10 mg by mouth daily.         Marland Kitchen oxyCODONE-acetaminophen (PERCOCET/ROXICET) 5-325 MG per tablet   Oral   Take 1 tablet by mouth every 6 (six) hours as needed. pain   60 tablet   0   . polyethylene glycol powder (MIRALAX) powder      Take 17 grams bid for 3 days, then daily as needed for constipation.         . pomalidomide (POMALYST) 4 MG capsule      Take 1 tablet, PO daily x 21 days with 7 day respite   21 capsule   0   . potassium chloride SA (K-DUR,KLOR-CON) 20 MEQ tablet      20 mEq 3 (three) times daily.         . valsartan (DIOVAN) 160 MG tablet   Oral   Take 1 tablet (160 mg total) by mouth daily.   30 tablet   3     Please deliver    BP 108/61  Pulse 67  Temp(Src) 98 F (36.7 C) (Oral)  Resp 16  Ht 5\' 5"  (1.651 m)  Wt 230 lb (104.327 kg)  BMI 38.27 kg/m2  SpO2 98%  Physical Exam  Nursing note and vitals reviewed. Constitutional: She is oriented to person, place, and time. She appears well-developed.  HENT:  Head: Normocephalic.  Eyes: Conjunctivae and EOM are normal. No scleral icterus.  Neck: Neck supple. No thyromegaly present.  Cardiovascular: Normal rate and regular rhythm.  Exam reveals no gallop and no friction rub.   No murmur heard. Pulmonary/Chest: No stridor. She has no wheezes. She has no rales. She exhibits no tenderness.  Abdominal: She exhibits no distension. There is no tenderness. There is no rebound.  Musculoskeletal: Normal range of motion. She exhibits edema (2+ bilateral ankles).  Lymphadenopathy:    She has no cervical adenopathy.  Neurological: She is oriented to person, place, and time. She exhibits normal muscle tone. Coordination normal.  Skin: No rash noted. No erythema.   Psychiatric: She has a normal mood and affect. Her behavior is normal.    ED Course  Procedures (including critical care time)  DIAGNOSTIC STUDIES: Oxygen Saturation is 98% on room air, normal by my interpretation.    COORDINATION OF CARE: 9:11 AM-Discussed treatment plan which includes cardiac workup with pt at bedside and pt agreed to plan.    Labs Review Labs Reviewed  CBC WITH DIFFERENTIAL - Abnormal; Notable for the following:    RBC 3.16 (*)    Hemoglobin 9.2 (*)    HCT 29.6 (*)    RDW 17.0 (*)    Monocytes Relative 15 (*)    Eosinophils Relative 7 (*)    All other components within normal limits  BASIC METABOLIC PANEL - Abnormal; Notable for the following:    Glucose, Bld 157 (*)    Creatinine, Ser 1.18 (*)    GFR calc non Af Amer 46 (*)    GFR calc Af Amer 53 (*)    All other components within normal limits  HEPATIC FUNCTION PANEL - Abnormal; Notable for the following:    Total Protein 5.4 (*)    Albumin  2.8 (*)    All other components within normal limits  TROPONIN I    Imaging Review Dg Chest Portable 1 View  02/20/2013   CLINICAL DATA:  Chest pain.  EXAM: PORTABLE CHEST - 1 VIEW  COMPARISON:  05/05/2011.  FINDINGS: The heart is borderline enlarged but stable. Mild tortuosity and calcification of the thoracic aorta is again noted. The right-sided power port is stable. The lungs are clear. No pleural effusion or pneumothorax. The bony thorax is intact.  IMPRESSION: Stable mild cardiac enlargement.  No acute cardiopulmonary findings.   Electronically Signed   By: Loralie Champagne M.D.   On: 02/20/2013 09:56    EKG Interpretation     Ventricular Rate:  73 PR Interval:  128 QRS Duration: 94 QT Interval:  382 QTC Calculation: 420 R Axis:   36 Text Interpretation:  Sinus rhythm with Premature supraventricular complexes Possible Left atrial enlargement ST \\T \ T wave abnormality, consider inferolateral ischemia Abnormal ECG When compared with ECG of 29-Feb-2012  11:37, Premature supraventricular complexes are now Present Minimal criteria for Septal infarct are no longer Present ST now depressed in Inferior leads T wave inversion now evident in Inferior leads T wave inversion less evident in Anterolateral leads            MDM  No diagnosis found. Nl,  Troponin,  And ct chest.  Pt to follow up with pcp    The chart was scribed for me under my direct supervision.  I personally performed the history, physical, and medical decision making and all procedures in the evaluation of this patient.[name], MD 02/20/13 [phone]

## 2013-03-07 NOTE — Progress Notes (Signed)
-  Rescheduled to 03/13/2013-

## 2013-03-08 ENCOUNTER — Ambulatory Visit (HOSPITAL_COMMUNITY): Payer: PRIVATE HEALTH INSURANCE | Admitting: Oncology

## 2013-03-08 ENCOUNTER — Other Ambulatory Visit (HOSPITAL_COMMUNITY): Payer: PRIVATE HEALTH INSURANCE

## 2013-03-11 ENCOUNTER — Telehealth (HOSPITAL_COMMUNITY): Payer: Self-pay | Admitting: Oncology

## 2013-03-11 ENCOUNTER — Ambulatory Visit (HOSPITAL_COMMUNITY)
Admission: RE | Admit: 2013-03-11 | Discharge: 2013-03-11 | Disposition: A | Discharge status: Home / Self Care | Payer: PRIVATE HEALTH INSURANCE | Source: Ambulatory Visit | Attending: Family Medicine | Admitting: Family Medicine

## 2013-03-11 DIAGNOSIS — Z1231 Encounter for screening mammogram for malignant neoplasm of breast: Secondary | ICD-10-CM | POA: Insufficient documentation

## 2013-03-11 DIAGNOSIS — Z139 Encounter for screening, unspecified: Secondary | ICD-10-CM

## 2013-03-12 NOTE — Progress Notes (Signed)
Diane Stalling, MD [address] [place] [address]  MULTIPLE  MYELOMA - Plan: oxyCODONE-acetaminophen (PERCOCET/ROXICET) 5-325 MG per tablet, dexamethasone (DECADRON) 4 MG tablet, sodium chloride 0.9 % injection 10 mL, heparin lock flush 100 unit/mL, CBC with Differential, Comprehensive metabolic panel, C-reactive protein, Multiple myeloma panel, serum, Kappa/lambda light chains, pomalidomide (POMALYST) 4 MG capsule, DISCONTINUED: pomalidomide (POMALYST) 4 MG capsule  Constipation - Plan: docusate sodium (COLACE) 100 MG capsule  CURRENT THERAPY:Pomalyst 4 mg 21 days on and 7 days off and Dexamethasone 20 mg every Wednesday (reduced from 40 mg due to poorly controlled glucose)  INTERVAL HISTORY: [name] 72 y.o. 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 She developed overt myeloma 07/16/2009 with IgG lambda M spike and she also had at one time an IgG kappa spike. As of April she has no detectable M spike. She is now on pomalidomide 21 days on 7 days off with excellent control and tolerability. She also uses 20 mg of dexamethasone on Wednesdays of every week. We had reduced her dose of dexamethasone to this level because of issues with her sugars.   On 02/20/2013 she called the clinic reporting chest pain.  She was advised to report to the ED.  In the ED, EKG was performed and etiology of chest pain was not discovered, but cardiac origin was acutely rule out.   Diane is doing well hematologically.  She requests a referral to a new PCP which we will do for her at her request.   She denies any complaints hematologically, and ROS questioning is negative.   She reports some constipation requiring her to dis-impact herself.  She reports that MiraLax does not work for her and she wants a pill.  Therefore, I have escribed Colace 100 mg BID to [facility].  She will call us on Monday for an update.   Past Medical History   Diagnosis Date  . Hypertension   . Arteriosclerotic cardiovascular disease (ASCVD)     Former patient of [facility] - PTCA apical LAD 2005  . Borderline diabetes     + proteinuria  . GERD (gastroesophageal reflux disease)     + constipation, gastroparesis  . Hyperlipidemia   . Female stress incontinence     H/o urinary tract infection  . Vertigo   . Insomnia   . Colon cancer     1995  . Multiple myeloma   . Osteoarthritis     cervical spine; status post surgery  . CVA (cerebral infarction) 01/2012  . Tubular adenoma of colon 01/09/12  . Gastroparesis 2009    abnl GES    has MULTIPLE  MYELOMA; Anemia, normocytic normochromic; Obesity, Class II, BMI 35-39.9, with comorbidity; Hypertension; Arteriosclerotic cardiovascular disease (ASCVD); Borderline diabetes; GERD (gastroesophageal reflux disease); Hyperlipidemia; Cerebrovascular disease; Tubular adenoma of colon; Chronic constipation; Muscle weakness (generalized); and Difficulty in walking(719.7) on her problem list.     is allergic to gabapentin and simvastatin.  Ms. Ray had no medications administered during this visit.  Past Surgical History  Procedure Laterality Date  . Partial colectomy  1995     [facility], colon cancer  . Abdominal hysterectomy  1970s    [facility]; secondary to metromenorrhagia  . Meniscus repair  2003    Arthroscopic, left,  [facility]  . Heel spur surgery      [facility], [facility]  . Cervical spine surgery  2004    [facility]  . Port-a-cath  removal  11/05/2010    Procedure: REMOVAL PORT-A-CATH;  Surgeon: Dalia Heading;  Location: [facility];  Service: General;  Laterality: Left;  started at 0846  . Portacath placement  11/05/2010    Procedure: INSERTION PORT-A-CATH;  Surgeon: Dalia Heading;  Location: [facility];  Service: General;  Laterality: Right;  Right subclavian, device left accessed   . Colonoscopy   07/02/2007    RMR: Normal rectum status post prior sigmoid resection with normal anastomosis identified at 35 cm.  Normal residual  colon.Marland Kitchen Next TCS 06/2012  . Esophagogastroduodenoscopy    08/22/2007    NWG:NFAOZ hiatal hernia/A circular, purplish, 1-cm, distal esophageal submucosal lesion of uncertain significance, otherwise normal esophagus. EUS -esophageal bleb (aberrant blood vessel). retained gastric contents/food  . Appendectomy  1953  . Esophagogastroduodenoscopy  01/09/12    Rourk: small HH, otherwise normal EGD  . Colonoscopy  01/09/12    Rourk: surgical anastamosis @ 25cm.  2 diminutive polyps (tubular adenomas) removed. next TCS 12/2014.    Denies any headaches, dizziness, double vision, fevers, chills, night sweats, nausea, vomiting, diarrhea, constipation, chest pain, heart palpitations, shortness of breath, blood in stool, black tarry stool, urinary pain, urinary burning, urinary frequency, hematuria.   PHYSICAL EXAMINATION  ECOG PERFORMANCE STATUS: 2 - Symptomatic, <50% confined to bed  Filed Vitals:   03/13/13 1000  BP: 152/88  Pulse: 64  Temp: 98.6 F (37 C)  Resp: 20    GENERAL:alert, no distress, well nourished, well developed, comfortable, cooperative, obese and smiling SKIN: skin color, texture, turgor are normal, no rashes or significant lesions HEAD: Normocephalic, No masses, lesions, tenderness or abnormalities EYES: normal, PERRLA, EOMI, Conjunctiva are pink and non-injected EARS: External ears normal OROPHARYNX:mucous membranes are moist  NECK: supple, no adenopathy, trachea midline LYMPH:  no palpable lymphadenopathy BREAST:not examined LUNGS: clear to auscultation  HEART: regular rate & rhythm, no murmurs and no gallops ABDOMEN:abdomen soft, non-tender, obese and normal bowel sounds BACK: Back symmetric, no curvature. EXTREMITIES:less then 2 second capillary refill, no joint deformities, effusion, or inflammation, no skin discoloration, no cyanosis, positive findings:  edema B/L LE edema with compression stockings on and in place.  NEURO: alert & oriented x 3 with fluent speech, no  focal motor/sensory deficits    LABORATORY DATA: CBC    Component Value Date/Time   WBC 5.7 02/20/2013 0957   RBC 3.16* 02/20/2013 0957   RBC 2.97* 08/19/2011 0715   HGB 9.2* 02/20/2013 0957   HCT 29.6* 02/20/2013 0957   PLT 182 02/20/2013 0957   MCV 93.7 02/20/2013 0957   MCH 29.1 02/20/2013 0957   MCHC 31.1 02/20/2013 0957   RDW 17.0* 02/20/2013 0957   LYMPHSABS 1.0 02/20/2013 0957   MONOABS 0.8 02/20/2013 0957   EOSABS 0.4 02/20/2013 0957   BASOSABS 0.0 02/20/2013 0957      Chemistry      Component Value Date/Time   NA 141 02/20/2013 0957   K 3.5 02/20/2013 0957   CL 104 02/20/2013 0957   CO2 31 02/20/2013 0957   BUN 15 02/20/2013 0957   CREATININE 1.18* 02/20/2013 0957   CREATININE 104.89* 03/04/2010 0936      Component Value Date/Time   CALCIUM 9.3 02/20/2013 0957   CALCIUM 10.4 08/19/2011 1105   ALKPHOS 45 02/20/2013 0957   AST 8 02/20/2013 0957   ALT 11 02/20/2013 0957   BILITOT 0.5 02/20/2013 0957        RADIOGRAPHIC STUDIES:  02/20/2013  CLINICAL DATA: History of multiple myeloma and  colon cancer  presenting with chest pain, shortness of breath and elevated  D-dimer.  EXAM:  CT ANGIOGRAPHY CHEST WITH CONTRAST  TECHNIQUE:  Multidetector CT imaging of the chest was performed using the  standard protocol during bolus administration of intravenous  contrast. Multiplanar CT image reconstructions including MIPs were  obtained to evaluate the vascular anatomy.  CONTRAST: OMNIPAQUE IOHEXOL 350 MG/ML SOLN  COMPARISON: No priors.  FINDINGS:  Mediastinum: There are no filling defects within the pulmonary  arterial tree to suggest pulmonary embolism. The pulmonic trunk  appears dilated measuring approximately 3.9 cm in diameter, which  may suggest pulmonary arterial hypertension. Heart size is mildly  enlarged. There is no significant pericardial fluid, thickening or  pericardial calcification. There is atherosclerosis of the thoracic  aorta, the  great vessels of the mediastinum and the coronary  arteries, including calcified atherosclerotic plaque in the left  anterior descending, left circumflex and right coronary arteries. No  pathologically enlarged mediastinal or hilar lymph nodes. Esophagus  is unremarkable in appearance. Right subclavian single-lumen porta  cath with tip terminating in the mid superior vena cava.  Lungs/Pleura: There is a mosaic appearance of the lung parenchyma,  suggestive of air trapping, likely related to small airways disease.  Areas of dependent subsegmental atelectasis and scarring noted in  the lung bases bilaterally (left greater than right). No definite  consolidative airspace disease. No definite suspicious appearing  pulmonary nodules or masses are identified at this time. No  pneumothorax.  Upper Abdomen: Unremarkable.  Musculoskeletal: There are no aggressive appearing lytic or blastic  lesions noted in the visualized portions of the skeleton.  Review of the MIP images confirms the above findings.  IMPRESSION:  1. No evidence of pulmonary embolism.  2. No definite acute findings in the chest to account for the  patient's symptoms.  3. However, there is evidence of air trapping in the lungs  bilaterally, suggesting underlying small airways disease.  4. Atherosclerosis, including three-vessel coronary artery disease.  Assessment for potential risk factor modification, dietary therapy  or pharmacologic therapy may be warranted, if clinically indicated.  5. Dilatation of the pulmonic trunk (3.9 cm in diameter) which may  suggest pulmonary arterial hypertension.  Electronically Signed  By: Trudie Reed M.D.  On: 02/20/2013 16:34     ASSESSMENT:  1. 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 She developed overt myeloma 07/16/2009 with IgG lambda M spike and she also had at one time an IgG kappa spike. As of April she has no detectable M spike. She is now on pomalidomide 21  days on 7 days off with excellent control and tolerability. She also uses 20 mg of dexamethasone on Wednesdays of every week. We had reduced her dose of dexamethasone to this level because of issues with her sugars.  2. History of stroke with some residual weakness in left arm and left leg. On ASA, Crestor, and antihypertensives.   Patient Active Problem List   Diagnosis Date Noted  . Muscle weakness (generalized) 10/24/2012  . Difficulty in walking(719.7) 10/24/2012  . Chronic constipation 08/28/2012  . Tubular adenoma of colon 05/08/2012  . Cerebrovascular disease 03/12/2012  . Hypertension   . Arteriosclerotic cardiovascular disease (ASCVD)   . Borderline diabetes   . GERD (gastroesophageal reflux disease)   . Hyperlipidemia   . Obesity, Class II, BMI 35-39.9, with comorbidity 02/10/2012  . Anemia, normocytic normochromic 12/15/2011  . MULTIPLE  MYELOMA 03/28/2008     PLAN:  1. I  personally reviewed and went over laboratory results with the patient.  2. I personally reviewed and went over radiographic studies with the patient.  3. Continue with current anti-MM regimen on Pomalyst 4 mg 21 days on and 7 days off and Dexamethasone 20 mg every Wednesday (reduced from 40 mg due to poorly controlled glucose)  4. Labs performed today: CBC diff, CMET, CRP, MM panel  5. Labs every 6 weeks: CBC diff, CMET, CRP, MM Panel 6. Influenza vaccine already received. 7. Rx for Pomalyst printed for faxing to [facility] 8. Rx for Dexamethasone refill 9. Hold Dilaudid 10. Rx for Oxycodone 11. Rx for Colace BID. 12. Patient will update [place] on Monday regarding BM.  13. Referral to new PCP at patient's request 14. Return in 12 weeks for follow-up    THERAPY PLAN:  She is tolerating therapy well, and her MM remains in a remission as of most recently. We will continue to monitor her labs and watch for MM progression.   All questions were answered. The patient knows to call the clinic with any  problems, questions or concerns. We can certainly see the patient much sooner if necessary.  Patient and plan discussed with Dr. Alla German and he is in agreement with the aforementioned.    I spent 30 minutes counseling the patient face to face. The total time spent in the appointment was 40 minutes.  KEFALAS,THOMAS

## 2013-03-13 ENCOUNTER — Encounter (HOSPITAL_COMMUNITY): Payer: Self-pay | Admitting: Oncology

## 2013-03-13 ENCOUNTER — Other Ambulatory Visit (HOSPITAL_COMMUNITY): Payer: Self-pay | Admitting: Oncology

## 2013-03-13 ENCOUNTER — Encounter (HOSPITAL_COMMUNITY): Payer: PRIVATE HEALTH INSURANCE

## 2013-03-13 ENCOUNTER — Encounter (HOSPITAL_COMMUNITY): Payer: PRIVATE HEALTH INSURANCE | Attending: Oncology | Admitting: Oncology

## 2013-03-13 VITALS — BP 152/88 | HR 64 | Temp 98.6°F | Resp 20 | Wt 227.6 lb

## 2013-03-13 DIAGNOSIS — E876 Hypokalemia: Secondary | ICD-10-CM

## 2013-03-13 DIAGNOSIS — C9000 Multiple myeloma not having achieved remission: Secondary | ICD-10-CM | POA: Insufficient documentation

## 2013-03-13 DIAGNOSIS — K5900 Constipation, unspecified: Secondary | ICD-10-CM

## 2013-03-13 LAB — CBC WITH DIFFERENTIAL/PLATELET
Basophils Relative: 0 % (ref 0–1)
Eosinophils Absolute: 0 10*3/uL (ref 0.0–0.7)
Hemoglobin: 9.4 g/dL — ABNORMAL LOW (ref 12.0–15.0)
Lymphs Abs: 0.6 10*3/uL — ABNORMAL LOW (ref 0.7–4.0)
MCH: 29.7 pg (ref 26.0–34.0)
MCHC: 31.3 g/dL (ref 30.0–36.0)
Monocytes Absolute: 0.2 10*3/uL (ref 0.1–1.0)
Monocytes Relative: 4 % (ref 3–12)
Neutro Abs: 4.7 10*3/uL (ref 1.7–7.7)
Neutrophils Relative %: 86 % — ABNORMAL HIGH (ref 43–77)
RBC: 3.16 MIL/uL — ABNORMAL LOW (ref 3.87–5.11)
RDW: 17.5 % — ABNORMAL HIGH (ref 11.5–15.5)

## 2013-03-13 LAB — COMPREHENSIVE METABOLIC PANEL
ALT: 11 U/L (ref 0–35)
Albumin: 3.5 g/dL (ref 3.5–5.2)
Alkaline Phosphatase: 52 U/L (ref 39–117)
BUN: 12 mg/dL (ref 6–23)
CO2: 28 mEq/L (ref 19–32)
Chloride: 101 mEq/L (ref 96–112)
Creatinine, Ser: 1.37 mg/dL — ABNORMAL HIGH (ref 0.50–1.10)
GFR calc Af Amer: 44 mL/min — ABNORMAL LOW (ref >90)
Glucose, Bld: 228 mg/dL — ABNORMAL HIGH (ref 70–99)
Potassium: 3.1 mEq/L — ABNORMAL LOW (ref 3.5–5.1)
Sodium: 139 mEq/L (ref 135–145)
Total Bilirubin: 0.4 mg/dL (ref 0.3–1.2)

## 2013-03-13 LAB — C-REACTIVE PROTEIN: CRP: 1.8 mg/dL — ABNORMAL HIGH (ref <0.60)

## 2013-03-13 MED ORDER — HEPARIN SOD (PORK) LOCK FLUSH 100 UNIT/ML IV SOLN
500.0000 [IU] | Freq: Once | INTRAVENOUS | Status: AC
  Administered 2013-03-13: 500 [IU] via INTRAVENOUS
  Filled 2013-03-13: qty 5

## 2013-03-13 MED ORDER — SODIUM CHLORIDE 0.9 % IJ SOLN
10.0000 mL | INTRAMUSCULAR | Status: DC | PRN
  Administered 2013-03-13: 10 mL via INTRAVENOUS

## 2013-03-13 MED ORDER — DOCUSATE SODIUM 100 MG PO CAPS: 100.0000 mg | ORAL_CAPSULE | Freq: Two times a day (BID) | ORAL | Status: AC

## 2013-03-13 MED ORDER — POMALIDOMIDE 4 MG PO CAPS: 4.0000 mg | ORAL_CAPSULE | Freq: Every day | ORAL | Status: DC

## 2013-03-13 MED ORDER — POTASSIUM CHLORIDE ER 10 MEQ PO TBCR: 10.0000 meq | EXTENDED_RELEASE_TABLET | Freq: Three times a day (TID) | ORAL | Status: DC

## 2013-03-13 MED ORDER — DEXAMETHASONE 4 MG PO TABS: 20.0000 mg | ORAL_TABLET | ORAL | Status: DC

## 2013-03-13 MED ORDER — OXYCODONE-ACETAMINOPHEN 5-325 MG PO TABS: 1.0000 | ORAL_TABLET | Freq: Four times a day (QID) | ORAL | Status: DC | PRN

## 2013-03-13 NOTE — Progress Notes (Signed)
[name] presented for Portacath access and flush. Proper placement of portacath confirmed by CXR. Portacath located right chest wall accessed with  H 20 needle. No blood return, re-accessed and no blood return. Flushes easily without any resistance, pain or discomfort. Portacath flushed with 20ml NS and 500U/50ml Heparin and needle removed intact. Procedure without incident. Patient tolerated procedure well.

## 2013-03-13 NOTE — Progress Notes (Signed)
See MD exam encounter for documentation 

## 2013-03-13 NOTE — Patient Instructions (Signed)
[facility] Discharge Instructions  RECOMMENDATIONS MADE BY THE CONSULTANT AND ANY TEST RESULTS WILL BE SENT TO YOUR REFERRING PHYSICIAN.  Discontinue use of Dilaudid (hydromorphone). A Shay Bartoli RX was given to you for Oxycodone (Percocet), use this medication for pain relief. Refills for dexamethasone and colace have been sent to your pharmacy. Lab work today with port flush and every 4 weeks thereafter. MD appointment again in 3 months.  Thank you for choosing [facility] to provide your oncology and hematology care.  To afford each patient quality time with our providers, please arrive at least 15 minutes before your scheduled appointment time.  With your help, our goal is to use those 15 minutes to complete the necessary work-up to ensure our physicians have the information they need to help with your evaluation and healthcare recommendations.    Effective January 1st, 2014, we ask that you re-schedule your appointment with our physicians should you arrive 10 or more minutes late for your appointment.  We strive to give you quality time with our providers, and arriving late affects you and other patients whose appointments are after yours.    Again, thank you for choosing [facility].  Our hope is that these requests will decrease the amount of time that you wait before being seen by our physicians.       _____________________________________________________________  Should you have questions after your visit to [facility], please contact our office at [phone] between the hours of 8:30 a.m. and 5:00 p.m.  Voicemails left after 4:30 p.m. will not be returned until the following business day.  For prescription refill requests, have your pharmacy contact our office with your prescription refill request.

## 2013-03-14 LAB — KAPPA/LAMBDA LIGHT CHAINS: Kappa free light chain: 0.81 mg/dL (ref 0.33–1.94)

## 2013-03-15 ENCOUNTER — Encounter (HOSPITAL_COMMUNITY): Payer: PRIVATE HEALTH INSURANCE

## 2013-03-15 LAB — MULTIPLE MYELOMA PANEL, SERUM
Alpha-2-Globulin: 14.9 % — ABNORMAL HIGH (ref 7.1–11.8)
Gamma Globulin: 11 % — ABNORMAL LOW (ref 11.1–18.8)
IgA: 189 mg/dL (ref 69–380)
IgG (Immunoglobin G), Serum: 736 mg/dL (ref 690–1700)
M-Spike, %: NOT DETECTED g/dL

## 2013-04-04 ENCOUNTER — Other Ambulatory Visit (HOSPITAL_COMMUNITY): Payer: Self-pay | Admitting: Oncology

## 2013-04-04 DIAGNOSIS — C9000 Multiple myeloma not having achieved remission: Secondary | ICD-10-CM

## 2013-04-04 MED ORDER — POMALIDOMIDE 4 MG PO CAPS: 4.0000 mg | ORAL_CAPSULE | Freq: Every day | ORAL | Status: DC

## 2013-04-09 ENCOUNTER — Other Ambulatory Visit (HOSPITAL_COMMUNITY): Payer: Self-pay | Admitting: Oncology

## 2013-04-09 ENCOUNTER — Telehealth (HOSPITAL_COMMUNITY): Payer: Self-pay | Admitting: Oncology

## 2013-04-09 DIAGNOSIS — G4700 Insomnia, unspecified: Secondary | ICD-10-CM

## 2013-04-09 MED ORDER — TEMAZEPAM 15 MG PO CAPS: 15.0000 mg | ORAL_CAPSULE | Freq: Every evening | ORAL | Status: AC | PRN

## 2013-04-10 ENCOUNTER — Other Ambulatory Visit (HOSPITAL_COMMUNITY): Payer: PRIVATE HEALTH INSURANCE

## 2013-04-22 ENCOUNTER — Encounter (HOSPITAL_COMMUNITY): Payer: PRIVATE HEALTH INSURANCE | Attending: Oncology

## 2013-04-22 ENCOUNTER — Other Ambulatory Visit (HOSPITAL_COMMUNITY): Payer: PRIVATE HEALTH INSURANCE

## 2013-04-22 DIAGNOSIS — C9000 Multiple myeloma not having achieved remission: Secondary | ICD-10-CM

## 2013-04-22 DIAGNOSIS — Z452 Encounter for adjustment and management of vascular access device: Secondary | ICD-10-CM

## 2013-04-22 LAB — COMPREHENSIVE METABOLIC PANEL
ALT: 12 U/L (ref 0–35)
Alkaline Phosphatase: 49 U/L (ref 39–117)
CO2: 24 mEq/L (ref 19–32)
Calcium: 9.5 mg/dL (ref 8.4–10.5)
Chloride: 102 mEq/L (ref 96–112)
Creatinine, Ser: 1.39 mg/dL — ABNORMAL HIGH (ref 0.50–1.10)
GFR calc Af Amer: 43 mL/min — ABNORMAL LOW (ref >90)
GFR calc non Af Amer: 37 mL/min — ABNORMAL LOW (ref >90)
Glucose, Bld: 319 mg/dL — ABNORMAL HIGH (ref 70–99)
Potassium: 3.7 mEq/L (ref 3.5–5.1)
Sodium: 137 mEq/L (ref 135–145)
Total Bilirubin: 0.4 mg/dL (ref 0.3–1.2)

## 2013-04-22 LAB — CBC WITH DIFFERENTIAL/PLATELET
Basophils Relative: 0 % (ref 0–1)
Eosinophils Relative: 2 % (ref 0–5)
Lymphocytes Relative: 10 % — ABNORMAL LOW (ref 12–46)
Lymphs Abs: 0.5 10*3/uL — ABNORMAL LOW (ref 0.7–4.0)
MCHC: 31.1 g/dL (ref 30.0–36.0)
MCV: 93.7 fL (ref 78.0–100.0)
Neutro Abs: 3.6 10*3/uL (ref 1.7–7.7)
Platelets: 223 10*3/uL (ref 150–400)
RBC: 3.64 MIL/uL — ABNORMAL LOW (ref 3.87–5.11)
RDW: 16.4 % — ABNORMAL HIGH (ref 11.5–15.5)
WBC: 4.9 10*3/uL (ref 4.0–10.5)

## 2013-04-22 MED ORDER — HEPARIN SOD (PORK) LOCK FLUSH 100 UNIT/ML IV SOLN
500.0000 [IU] | Freq: Once | INTRAVENOUS | Status: AC
  Administered 2013-04-22: 500 [IU] via INTRAVENOUS

## 2013-04-22 MED ORDER — SODIUM CHLORIDE 0.9 % IJ SOLN
10.0000 mL | INTRAMUSCULAR | Status: DC | PRN
  Administered 2013-04-22: 10 mL via INTRAVENOUS

## 2013-04-22 MED ORDER — HEPARIN SOD (PORK) LOCK FLUSH 100 UNIT/ML IV SOLN
INTRAVENOUS | Status: AC
  Filled 2013-04-22: qty 5

## 2013-04-22 NOTE — Progress Notes (Signed)
[name] presented for Portacath access and flush.  Proper placement of portacath confirmed by CXR.  Portacath located right chest wall accessed with  H 20 needle.  Good blood return present. Portacath flushed with 20ml NS and 500U/20ml Heparin and needle removed intact.  Procedure tolerated well and without incident.

## 2013-04-23 LAB — KAPPA/LAMBDA LIGHT CHAINS
Kappa, lambda light chain ratio: 0.38 (ref 0.26–1.65)
Lambda free light chains: 2.78 mg/dL — ABNORMAL HIGH (ref 0.57–2.63)

## 2013-04-24 LAB — MULTIPLE MYELOMA PANEL, SERUM
Albumin ELP: 59.5 % (ref 55.8–66.1)
Alpha-1-Globulin: 7.1 % — ABNORMAL HIGH (ref 2.9–4.9)
Alpha-2-Globulin: 12.8 % — ABNORMAL HIGH (ref 7.1–11.8)
Beta 2: 4.4 % (ref 3.2–6.5)
Beta Globulin: 6.8 % (ref 4.7–7.2)
Gamma Globulin: 9.4 % — ABNORMAL LOW (ref 11.1–18.8)
IgA: 176 mg/dL (ref 69–380)
IgG (Immunoglobin G), Serum: 542 mg/dL — ABNORMAL LOW (ref 690–1700)
IgM, Serum: 55 mg/dL — ABNORMAL LOW (ref 52–322)
M-Spike, %: NOT DETECTED g/dL
Total Protein: 5.9 g/dL — ABNORMAL LOW (ref 6.0–8.3)

## 2013-04-26 ENCOUNTER — Encounter (HOSPITAL_COMMUNITY): Payer: Self-pay | Admitting: Emergency Medicine

## 2013-04-26 ENCOUNTER — Emergency Department (HOSPITAL_COMMUNITY): Payer: PRIVATE HEALTH INSURANCE

## 2013-04-26 ENCOUNTER — Inpatient Hospital Stay (HOSPITAL_COMMUNITY)
Admission: EM | Admit: 2013-04-26 | Discharge: 2013-05-01 | DRG: 191 | Disposition: A | Discharge status: Home / Self Care | Payer: PRIVATE HEALTH INSURANCE | Attending: Family Medicine | Admitting: Family Medicine

## 2013-04-26 DIAGNOSIS — K3184 Gastroparesis: Secondary | ICD-10-CM | POA: Diagnosis present

## 2013-04-26 DIAGNOSIS — Z8249 Family history of ischemic heart disease and other diseases of the circulatory system: Secondary | ICD-10-CM

## 2013-04-26 DIAGNOSIS — M810 Age-related osteoporosis without current pathological fracture: Secondary | ICD-10-CM | POA: Diagnosis present

## 2013-04-26 DIAGNOSIS — K219 Gastro-esophageal reflux disease without esophagitis: Secondary | ICD-10-CM | POA: Diagnosis present

## 2013-04-26 DIAGNOSIS — N189 Chronic kidney disease, unspecified: Secondary | ICD-10-CM | POA: Diagnosis present

## 2013-04-26 DIAGNOSIS — Z794 Long term (current) use of insulin: Secondary | ICD-10-CM

## 2013-04-26 DIAGNOSIS — N179 Acute kidney failure, unspecified: Secondary | ICD-10-CM | POA: Diagnosis present

## 2013-04-26 DIAGNOSIS — E785 Hyperlipidemia, unspecified: Secondary | ICD-10-CM | POA: Diagnosis present

## 2013-04-26 DIAGNOSIS — B349 Viral infection, unspecified: Secondary | ICD-10-CM

## 2013-04-26 DIAGNOSIS — Z87891 Personal history of nicotine dependence: Secondary | ICD-10-CM

## 2013-04-26 DIAGNOSIS — Z9861 Coronary angioplasty status: Secondary | ICD-10-CM

## 2013-04-26 DIAGNOSIS — Z8673 Personal history of transient ischemic attack (TIA), and cerebral infarction without residual deficits: Secondary | ICD-10-CM

## 2013-04-26 DIAGNOSIS — E559 Vitamin D deficiency, unspecified: Secondary | ICD-10-CM | POA: Diagnosis present

## 2013-04-26 DIAGNOSIS — E875 Hyperkalemia: Secondary | ICD-10-CM | POA: Diagnosis present

## 2013-04-26 DIAGNOSIS — C9000 Multiple myeloma not having achieved remission: Secondary | ICD-10-CM | POA: Diagnosis present

## 2013-04-26 DIAGNOSIS — J441 Chronic obstructive pulmonary disease with (acute) exacerbation: Secondary | ICD-10-CM | POA: Diagnosis present

## 2013-04-26 DIAGNOSIS — D638 Anemia in other chronic diseases classified elsewhere: Secondary | ICD-10-CM | POA: Diagnosis present

## 2013-04-26 DIAGNOSIS — M1990 Unspecified osteoarthritis, unspecified site: Secondary | ICD-10-CM | POA: Diagnosis present

## 2013-04-26 DIAGNOSIS — I129 Hypertensive chronic kidney disease with stage 1 through stage 4 chronic kidney disease, or unspecified chronic kidney disease: Secondary | ICD-10-CM | POA: Diagnosis present

## 2013-04-26 DIAGNOSIS — E119 Type 2 diabetes mellitus without complications: Secondary | ICD-10-CM | POA: Diagnosis present

## 2013-04-26 DIAGNOSIS — I2510 Atherosclerotic heart disease of native coronary artery without angina pectoris: Secondary | ICD-10-CM | POA: Diagnosis present

## 2013-04-26 DIAGNOSIS — J45901 Unspecified asthma with (acute) exacerbation: Principal | ICD-10-CM

## 2013-04-26 DIAGNOSIS — Z85038 Personal history of other malignant neoplasm of large intestine: Secondary | ICD-10-CM

## 2013-04-26 DIAGNOSIS — Z91199 Patient's noncompliance with other medical treatment and regimen due to unspecified reason: Secondary | ICD-10-CM

## 2013-04-26 DIAGNOSIS — F411 Generalized anxiety disorder: Secondary | ICD-10-CM | POA: Diagnosis present

## 2013-04-26 DIAGNOSIS — Z6841 Body Mass Index (BMI) 40.0 and over, adult: Secondary | ICD-10-CM

## 2013-04-26 DIAGNOSIS — Z9119 Patient's noncompliance with other medical treatment and regimen: Secondary | ICD-10-CM

## 2013-04-26 DIAGNOSIS — E669 Obesity, unspecified: Secondary | ICD-10-CM | POA: Diagnosis present

## 2013-04-26 LAB — CBC WITH DIFFERENTIAL/PLATELET
BASOS ABS: 0 10*3/uL (ref 0.0–0.1)
BASOS PCT: 0 % (ref 0–1)
Eosinophils Absolute: 0 10*3/uL (ref 0.0–0.7)
Eosinophils Relative: 1 % (ref 0–5)
HCT: 33.4 % — ABNORMAL LOW (ref 36.0–46.0)
HEMOGLOBIN: 10.4 g/dL — AB (ref 12.0–15.0)
Lymphocytes Relative: 13 % (ref 12–46)
Lymphs Abs: 0.8 10*3/uL (ref 0.7–4.0)
MCH: 29.5 pg (ref 26.0–34.0)
MCHC: 31.1 g/dL (ref 30.0–36.0)
MCV: 94.9 fL (ref 78.0–100.0)
Monocytes Absolute: 0.9 10*3/uL (ref 0.1–1.0)
Monocytes Relative: 15 % — ABNORMAL HIGH (ref 3–12)
NEUTROS ABS: 4.5 10*3/uL (ref 1.7–7.7)
Neutrophils Relative %: 71 % (ref 43–77)
Platelets: 217 10*3/uL (ref 150–400)
RBC: 3.52 MIL/uL — ABNORMAL LOW (ref 3.87–5.11)
RDW: 17.1 % — AB (ref 11.5–15.5)
WBC: 6.3 10*3/uL (ref 4.0–10.5)

## 2013-04-26 LAB — GLUCOSE, CAPILLARY
Glucose-Capillary: 506 mg/dL — ABNORMAL HIGH (ref 70–99)
Glucose-Capillary: 461 mg/dL — ABNORMAL HIGH (ref 70–99)
Glucose-Capillary: 494 mg/dL — ABNORMAL HIGH (ref 70–99)
Glucose-Capillary: 533 mg/dL — ABNORMAL HIGH (ref 70–99)
Glucose-Capillary: 269 mg/dL — ABNORMAL HIGH (ref 70–99)

## 2013-04-26 LAB — HEMOGLOBIN A1C
Hgb A1c MFr Bld: 9.6 % — ABNORMAL HIGH (ref <5.7)
MEAN PLASMA GLUCOSE: 229 mg/dL — AB (ref <117)

## 2013-04-26 LAB — BASIC METABOLIC PANEL
BUN: 23 mg/dL (ref 6–23)
CHLORIDE: 102 meq/L (ref 96–112)
CO2: 21 mEq/L (ref 19–32)
CREATININE: 1.6 mg/dL — AB (ref 0.50–1.10)
Calcium: 10.2 mg/dL (ref 8.4–10.5)
GFR calc non Af Amer: 31 mL/min — ABNORMAL LOW (ref >90)
GFR, EST AFRICAN AMERICAN: 36 mL/min — AB (ref >90)
Glucose, Bld: 302 mg/dL — ABNORMAL HIGH (ref 70–99)
POTASSIUM: 4.3 meq/L (ref 3.7–5.3)
Sodium: 139 mEq/L (ref 137–147)

## 2013-04-26 LAB — GLUCOSE, RANDOM: Glucose, Bld: 516 mg/dL — ABNORMAL HIGH (ref 70–99)

## 2013-04-26 LAB — INFLUENZA PANEL BY PCR (TYPE A & B)
H1N1FLUPCR: NOT DETECTED
INFLBPCR: NEGATIVE
Influenza A By PCR: NEGATIVE

## 2013-04-26 MED ORDER — ALBUTEROL (5 MG/ML) CONTINUOUS INHALATION SOLN: 10.0000 mg/h | INHALATION_SOLUTION | Freq: Once | RESPIRATORY_TRACT | Status: AC

## 2013-04-26 MED ORDER — NEBIVOLOL HCL 2.5 MG PO TABS
5.0000 mg | ORAL_TABLET | Freq: Every day | ORAL | Status: DC
  Administered 2013-04-26 – 2013-05-01 (×6): 5 mg via ORAL
  Filled 2013-04-26 (×8): qty 2

## 2013-04-26 MED ORDER — IPRATROPIUM-ALBUTEROL 0.5-2.5 (3) MG/3ML IN SOLN: 3.0000 mL | RESPIRATORY_TRACT | Status: DC | PRN

## 2013-04-26 MED ORDER — OXYCODONE-ACETAMINOPHEN 5-325 MG PO TABS
1.0000 | ORAL_TABLET | ORAL | Status: DC | PRN
  Administered 2013-04-27 (×2): 1 via ORAL
  Filled 2013-04-26 (×2): qty 1

## 2013-04-26 MED ORDER — IPRATROPIUM-ALBUTEROL 0.5-2.5 (3) MG/3ML IN SOLN
3.0000 mL | RESPIRATORY_TRACT | Status: DC
  Administered 2013-04-26 – 2013-05-01 (×22): 3 mL via RESPIRATORY_TRACT
  Filled 2013-04-26 (×24): qty 3

## 2013-04-26 MED ORDER — IRBESARTAN 150 MG PO TABS
150.0000 mg | ORAL_TABLET | Freq: Every day | ORAL | Status: DC
  Administered 2013-04-26 – 2013-04-28 (×3): 150 mg via ORAL
  Filled 2013-04-26 (×3): qty 1

## 2013-04-26 MED ORDER — PANTOPRAZOLE SODIUM 40 MG PO TBEC
40.0000 mg | DELAYED_RELEASE_TABLET | Freq: Every day | ORAL | Status: DC
  Administered 2013-04-26 – 2013-05-01 (×6): 40 mg via ORAL
  Filled 2013-04-26 (×6): qty 1

## 2013-04-26 MED ORDER — INSULIN ASPART 100 UNIT/ML ~~LOC~~ SOLN
0.0000 [IU] | Freq: Three times a day (TID) | SUBCUTANEOUS | Status: DC
  Administered 2013-04-26: 20 [IU] via SUBCUTANEOUS
  Administered 2013-04-27: 7 [IU] via SUBCUTANEOUS
  Administered 2013-04-27: 15 [IU] via SUBCUTANEOUS
  Administered 2013-04-27: 7 [IU] via SUBCUTANEOUS
  Administered 2013-04-28 (×2): 11 [IU] via SUBCUTANEOUS
  Administered 2013-04-28: 7 [IU] via SUBCUTANEOUS
  Administered 2013-04-29: 15 [IU] via SUBCUTANEOUS
  Administered 2013-04-29: 7 [IU] via SUBCUTANEOUS
  Administered 2013-04-29: 15 [IU] via SUBCUTANEOUS
  Administered 2013-04-30: 7 [IU] via SUBCUTANEOUS
  Administered 2013-04-30: 4 [IU] via SUBCUTANEOUS
  Administered 2013-04-30: 11 [IU] via SUBCUTANEOUS
  Administered 2013-05-01 (×2): 4 [IU] via SUBCUTANEOUS

## 2013-04-26 MED ORDER — ENOXAPARIN SODIUM 60 MG/0.6ML ~~LOC~~ SOLN
0.5000 mg/kg | Freq: Every day | SUBCUTANEOUS | Status: DC
  Administered 2013-04-26 – 2013-05-01 (×6): 50 mg via SUBCUTANEOUS
  Filled 2013-04-26 (×6): qty 0.6

## 2013-04-26 MED ORDER — OXYBUTYNIN CHLORIDE 5 MG PO TABS
10.0000 mg | ORAL_TABLET | Freq: Every day | ORAL | Status: DC
  Administered 2013-04-26 – 2013-05-01 (×6): 10 mg via ORAL
  Filled 2013-04-26 (×6): qty 2

## 2013-04-26 MED ORDER — MELOXICAM 7.5 MG PO TABS
7.5000 mg | ORAL_TABLET | Freq: Every day | ORAL | Status: DC
  Administered 2013-04-26 – 2013-05-01 (×6): 7.5 mg via ORAL
  Filled 2013-04-26 (×8): qty 1

## 2013-04-26 MED ORDER — ALBUTEROL SULFATE (2.5 MG/3ML) 0.083% IN NEBU
INHALATION_SOLUTION | RESPIRATORY_TRACT | Status: AC
  Administered 2013-04-26: 10 mg
  Filled 2013-04-26: qty 12

## 2013-04-26 MED ORDER — AMLODIPINE BESYLATE 5 MG PO TABS
5.0000 mg | ORAL_TABLET | Freq: Every day | ORAL | Status: DC
  Administered 2013-04-26 – 2013-05-01 (×6): 5 mg via ORAL
  Filled 2013-04-26 (×6): qty 1

## 2013-04-26 MED ORDER — ASPIRIN EC 81 MG PO TBEC
81.0000 mg | DELAYED_RELEASE_TABLET | Freq: Every day | ORAL | Status: DC
  Administered 2013-04-26 – 2013-05-01 (×6): 81 mg via ORAL
  Filled 2013-04-26 (×7): qty 1

## 2013-04-26 MED ORDER — ALBUTEROL SULFATE (2.5 MG/3ML) 0.083% IN NEBU
5.0000 mg | INHALATION_SOLUTION | Freq: Once | RESPIRATORY_TRACT | Status: AC
  Administered 2013-04-26: 5 mg via RESPIRATORY_TRACT
  Filled 2013-04-26: qty 6

## 2013-04-26 MED ORDER — IPRATROPIUM-ALBUTEROL 0.5-2.5 (3) MG/3ML IN SOLN
3.0000 mL | RESPIRATORY_TRACT | Status: DC
  Administered 2013-04-26 (×2): 3 mL via RESPIRATORY_TRACT
  Filled 2013-04-26 (×3): qty 3

## 2013-04-26 MED ORDER — DOCUSATE SODIUM 100 MG PO CAPS
100.0000 mg | ORAL_CAPSULE | Freq: Two times a day (BID) | ORAL | Status: DC
  Administered 2013-04-26 – 2013-05-01 (×11): 100 mg via ORAL
  Filled 2013-04-26 (×12): qty 1

## 2013-04-26 MED ORDER — IPRATROPIUM BROMIDE 0.02 % IN SOLN
0.5000 mg | Freq: Once | RESPIRATORY_TRACT | Status: AC
  Administered 2013-04-26: 0.5 mg via RESPIRATORY_TRACT
  Filled 2013-04-26: qty 2.5

## 2013-04-26 MED ORDER — METHYLPREDNISOLONE SODIUM SUCC 125 MG IJ SOLR
125.0000 mg | Freq: Three times a day (TID) | INTRAMUSCULAR | Status: DC
  Administered 2013-04-26 – 2013-04-27 (×2): 125 mg via INTRAVENOUS
  Filled 2013-04-26 (×3): qty 2

## 2013-04-26 MED ORDER — VITAMIN D3 25 MCG (1000 UNIT) PO TABS
1000.0000 [IU] | ORAL_TABLET | Freq: Every day | ORAL | Status: DC
  Administered 2013-04-26 – 2013-05-01 (×6): 1000 [IU] via ORAL
  Filled 2013-04-26 (×14): qty 1

## 2013-04-26 MED ORDER — TEMAZEPAM 15 MG PO CAPS
15.0000 mg | ORAL_CAPSULE | Freq: Every evening | ORAL | Status: DC | PRN
  Administered 2013-04-27: 15 mg via ORAL
  Filled 2013-04-26: qty 1

## 2013-04-26 MED ORDER — INSULIN DETEMIR 100 UNIT/ML ~~LOC~~ SOLN
5.0000 [IU] | Freq: Every day | SUBCUTANEOUS | Status: DC
  Administered 2013-04-27 – 2013-04-30 (×5): 5 [IU] via SUBCUTANEOUS
  Filled 2013-04-26 (×6): qty 0.05

## 2013-04-26 MED ORDER — POTASSIUM CHLORIDE CRYS ER 10 MEQ PO TBCR
10.0000 meq | EXTENDED_RELEASE_TABLET | Freq: Two times a day (BID) | ORAL | Status: DC
  Administered 2013-04-26 – 2013-04-27 (×4): 10 meq via ORAL
  Filled 2013-04-26 (×4): qty 1

## 2013-04-26 MED ORDER — INSULIN ASPART 100 UNIT/ML ~~LOC~~ SOLN: 0.0000 [IU] | Freq: Three times a day (TID) | SUBCUTANEOUS | Status: DC

## 2013-04-26 MED ORDER — ATORVASTATIN CALCIUM 20 MG PO TABS
20.0000 mg | ORAL_TABLET | Freq: Every day | ORAL | Status: DC
Start: 2013-04-26 — End: 2013-05-01
  Administered 2013-04-26 – 2013-04-30 (×5): 20 mg via ORAL
  Filled 2013-04-26 (×5): qty 1

## 2013-04-26 MED ORDER — PREDNISONE 50 MG PO TABS
60.0000 mg | ORAL_TABLET | Freq: Once | ORAL | Status: AC
  Administered 2013-04-26: 08:00:00 60 mg via ORAL
  Filled 2013-04-26 (×2): qty 1

## 2013-04-26 NOTE — ED Notes (Signed)
Given albuterol tx via ems prior to arrival to er

## 2013-04-26 NOTE — Progress Notes (Signed)
04/26/13 1651 Notified Dr. Cindie Laroche of patient EKG results and HR currently 110, sinus tachy. Notified influenza PCR negative. Donavan Foil, RN

## 2013-04-26 NOTE — Progress Notes (Signed)
Pharmacy Consult: Lovenox for VTE prophylaxis.  Patient Measurements: Height: 5' (152.4 cm) Weight: 230 lb (104.327 kg) IBW/kg (Calculated) : 45.5 Body mass index is 44.92 kg/(m^2).   VITALS Filed Vitals:   04/26/13 1106  BP: 154/87  Pulse: 138  Temp:   Resp: 30    INR Last Three Days: No results found for this basename: INR,  in the last 72 hours  CBC:    Component Value Date/Time   WBC 6.3 04/26/2013 0946   RBC 3.52* 04/26/2013 0946   RBC 2.97* 08/19/2011 0715   HGB 10.4* 04/26/2013 0946   HCT 33.4* 04/26/2013 0946   PLT 217 04/26/2013 0946   MCV 94.9 04/26/2013 0946   MCH 29.5 04/26/2013 0946   MCHC 31.1 04/26/2013 0946   RDW 17.1* 04/26/2013 0946   LYMPHSABS 0.8 04/26/2013 0946   MONOABS 0.9 04/26/2013 0946   EOSABS 0.0 04/26/2013 0946   BASOSABS 0.0 04/26/2013 0946    RENAL FUNCTION: Estimated Creatinine Clearance: 35.1 ml/min (by C-G formula based on Cr of 1.6).  Assessment: Dose stable for age, weight, renal function and indication.  Plan: Sign off.  Pricilla Larsson, [facility] 04/26/2013 11:59 AM

## 2013-04-26 NOTE — H&P (Signed)
[name], [name]                 ACCOUNT NO.:  [id number]  MEDICAL RECORD NO.:  [id number]  LOCATION:  [facility]                         FACILITY:  [facility]  PHYSICIAN:  Unk Lightning, MDDATE OF BIRTH:  1941/02/23  DATE OF ADMISSION:  04/26/2013 DATE OF DISCHARGE:  LH                             HISTORY & PHYSICAL   HISTORY OF PRESENT ILLNESS:  The patient is a 73 year old black female, lives with her daughter who checks up on her, is a poor historian, has a history of severe hypertension, some chronic renal failure, hyperlipidemia, multiple myeloma, chronic anemia from multiple myeloma for which she receives hormonal therapy once a month.  She complains of some coughing and some increasing shortness of breath over the preceding 24 hours.  She denies any sputum.  No fever, rigors, hemoptysis noted. She denies any anginal chest pain or orthopnea.  She was found in the ER to have some mild reactive airway disease, given 2-3 nebulizer treatments with failure to clear totally, subsequently is being admitted for a reactive airway disease exacerbation, possible viral mediated. Chest x-ray has no evidence of infiltrate.  She denies any other palpitations, dizziness, syncope, sputum, or hemoptysis.  PAST MEDICAL HISTORY:  Significant for hyperlipidemia, hypertension, chronic renal failure, vitamin D deficiency, chronic degenerative joint disease, multiple myeloma, anemia of chronic disease, and GERD.  CURRENT MEDICINES:  Norvasc 5 mg p.o. daily, Bystolic 5 mg p.o. daily, Diovan 160/12.5 p.o. daily, Crestor 20 mg p.o. daily, oxycodone 5 mg p.o. t.i.d. p.r.n., potassium chloride 10 mEq p.o. daily p.r.n., vitamin D 3000 units daily, Prilosec 20 mg p.o. daily, Decadron 4 mg p.o. daily.  Past medical history is significant for the aforementioned.  PAST SURGICAL HISTORY:  Remarkable for appendectomy and cholecystectomy.  ALLERGIES:  She has no known allergies.  SOCIAL HISTORY:  She  does not smoke.  She lives at home alone.  Daughter checks on her every day.  Does not imbibe alcohol.  There is question of compliance with her medicines, although daughter ensures some of this.  PHYSICAL EXAMINATION:  VITAL SIGNS:  Blood pressure is 138/84, pulse is 68 and regular, respiratory rate is 20. HEENT:  Eyes, PERRLA intact.  Sclerae clear.  Conjunctivae pink. NECK:  No JVD.  No carotid bruits.  No thyromegaly.  No thyroid bruits. LUNGS:  Show mild inspiratory and expiratory wheeze, end-expiratory wheeze.  No rhonchi.  No rales appreciable. HEART:  Regular rhythm.  No murmurs, gallops, heaves, thrills, rubs. ABDOMEN:  Obese, soft, nontender.  Bowel sounds normoactive.  No guarding, rebound, mass, or megaly. EXTREMITIES:  1+ chronic pedal edema.  Peripheral pulses 1+ and intact bilaterally. NEUROLOGIC:  Cranial nerves II-XII grossly intact.  The patient moves all 4 extremities.  Plantars are downgoing.  IMPRESSION: 1. Reactive airway disease, COPD exacerbation. 2. Possible viral mediation. 3. Hypertension. 4. Hyperlipidemia. 5. Multiple myeloma. 6. Anemia of chronic disease. 7. Anxiety.  PLAN:  To admit, place her on Solu-Medrol 125 IV q.8 hours, DuoNeb unit dose q.4h p.r.n., monitor blood sugars as one in the ER was 300.  She is not a known diabetic, this is new, a.c. and at bedtime.  We will make further recommendations  as the database expands.     Unk Lightning, MD     RMD/MEDQ  D:  04/26/2013  T:  04/26/2013  Job:  [id number]

## 2013-04-26 NOTE — ED Notes (Signed)
Pt c/o SOB and cough since this morning. Pt states cough is productive. Pt denies chest pain. Pt received breathing via EMS prior to arrival and states "the medicine helped a little bit".

## 2013-04-26 NOTE — ED Provider Notes (Signed)
CSN: [id number]     Arrival date & time 04/26/13  1856 History  This chart was scribed for Diane Christen, MD by Jenne Campus, ED Scribe. This patient was seen in room APA10/APA10 and the patient's care was started at 7:27 AM.   Chief Complaint  Patient presents with  . Shortness of Breath    sob since midnight.    The history is provided by the patient. No language interpreter was used.    HPI Comments: [name] is a 73 y.o. female with a h/o CA who presents to the Emergency Department by ambulance from home complaining of gradual onset, persistent SOB with associated wheezing that started upon waking this morning. She denies having any prior episodes of the same. She reports that she received an albuterol treatment en route with improvement. She denies any known fevers, sweats, chills, rusty sputum.  Severity is moderate   She states that she lives with her daughter   Past Medical History  Diagnosis Date  . Hypertension   . Arteriosclerotic cardiovascular disease (ASCVD)     Former patient of [facility] - PTCA apical LAD 2005  . Borderline diabetes     + proteinuria  . GERD (gastroesophageal reflux disease)     + constipation, gastroparesis  . Hyperlipidemia   . Female stress incontinence     H/o urinary tract infection  . Vertigo   . Insomnia   . Colon cancer     1995  . Multiple myeloma   . Osteoarthritis     cervical spine; status post surgery  . CVA (cerebral infarction) 01/2012  . Tubular adenoma of colon 01/09/12  . Gastroparesis 2009    abnl GES   Past Surgical History  Procedure Laterality Date  . Partial colectomy  1995     [facility], colon cancer  . Abdominal hysterectomy  1970s    [facility]; secondary to metromenorrhagia  . Meniscus repair  2003    Arthroscopic, left,  [facility]  . Heel spur surgery      [facility], [facility]  . Cervical spine surgery  2004    [facility]  . Port-a-cath removal  11/05/2010    Procedure: REMOVAL PORT-A-CATH;  Surgeon: Jamesetta So;  Location: [facility] ORS;   Service: General;  Laterality: Left;  started at 0846  . Portacath placement  11/05/2010    Procedure: INSERTION PORT-A-CATH;  Surgeon: Jamesetta So;  Location: [facility] ORS;  Service: General;  Laterality: Right;  Right subclavian, device left accessed   . Colonoscopy   07/02/2007    RMR: Normal rectum status post prior sigmoid resection with normal anastomosis identified at 35 cm.  Normal residual colon.Marland Kitchen Next TCS 06/2012  . Esophagogastroduodenoscopy    08/22/2007    DJS:HFWYO hiatal hernia/A circular, purplish, 1-cm, distal esophageal submucosal lesion of uncertain significance, otherwise normal esophagus. EUS -esophageal bleb (aberrant blood vessel). retained gastric contents/food  . Appendectomy  1953  . Esophagogastroduodenoscopy  01/09/12    Rourk: small HH, otherwise normal EGD  . Colonoscopy  01/09/12    Rourk: surgical anastamosis @ 25cm.  2 diminutive polyps (tubular adenomas) removed. next TCS 12/2014.   Family History  Problem Relation Age of Onset  . Anesthesia problems Neg Hx   . Hypotension Neg Hx   . Malignant hyperthermia Neg Hx   . Pseudochol deficiency Neg Hx   . Lung disease Mother   . Heart disease Father   . Heart disease Brother   . Hypertension Brother   . Arthritis Daughter   .  Seizures Daughter   . Multiple sclerosis Son   . Colon cancer Brother 59   History  Substance Use Topics  . Smoking status: Former Smoker -- 0.25 packs/day for 15 years    Types: Cigarettes    Quit date: 11/03/1990  . Smokeless tobacco: Never Used  . Alcohol Use: No   No OB history provided.  Review of Systems  A complete 10 system review of systems was obtained and all systems are negative except as noted in the HPI and PMH.   Allergies  Gabapentin and Simvastatin  Home Medications   Current Outpatient Rx  Name  Route  Sig  Dispense  Refill  . amLODipine (NORVASC) 5 MG tablet   Oral   Take 1 tablet (5 mg total) by mouth daily.   30 tablet   2   . aspirin EC 81 MG  tablet   Oral   Take 81 mg by mouth daily.         . cholecalciferol (VITAMIN D) 1000 UNITS tablet   Oral   Take 1,000 Units by mouth daily.         . CRESTOR 20 MG tablet   Oral   Take 20 mg by mouth daily.          Marland Kitchen dexamethasone (DECADRON) 4 MG tablet   Oral   Take 5 tablets (20 mg total) by mouth once a week. Wednesday   20 tablet   3   . docusate sodium (COLACE) 100 MG capsule   Oral   Take 1 capsule (100 mg total) by mouth 2 (two) times daily.   60 capsule   4   . meloxicam (MOBIC) 7.5 MG tablet   Oral   Take 7.5 mg by mouth daily.          . naproxen sodium (ANAPROX) 220 MG tablet   Oral   Take 220 mg by mouth 2 (two) times daily with a meal.         . nebivolol (BYSTOLIC) 5 MG tablet   Oral   Take 5 mg by mouth daily.          Marland Kitchen omeprazole (PRILOSEC) 20 MG capsule   Oral   Take 20 mg by mouth 2 (two) times daily.          Marland Kitchen oxybutynin (DITROPAN) 5 MG tablet   Oral   Take 10 mg by mouth daily.         Marland Kitchen oxyCODONE-acetaminophen (PERCOCET/ROXICET) 5-325 MG per tablet   Oral   Take 1 tablet by mouth every 6 (six) hours as needed. pain   60 tablet   0   . potassium chloride (K-DUR) 10 MEQ tablet   Oral   Take 10 mEq by mouth 2 (two) times daily.         . valsartan (DIOVAN) 160 MG tablet   Oral   Take 1 tablet (160 mg total) by mouth daily.   30 tablet   3     Please deliver   . temazepam (RESTORIL) 15 MG capsule   Oral   Take 1 capsule (15 mg total) by mouth at bedtime as needed for sleep.   30 capsule   1     Please deliver    Triage Vitals: BP 163/108  Pulse 138  Temp(Src) 98.8 F (37.1 C) (Oral)  Resp 28  Ht 5' (1.524 m)  Wt 230 lb (104.327 kg)  BMI 44.92 kg/m2  SpO2 100%  Physical Exam  Nursing note and vitals reviewed. Constitutional: She is oriented to person, place, and time. She appears well-developed and well-nourished.  HENT:  Head: Normocephalic and atraumatic.  Eyes: Conjunctivae and EOM are  normal.  Neck: Normal range of motion. Neck supple.  Cardiovascular: Normal rate, regular rhythm and normal heart sounds.   Pulmonary/Chest: No respiratory distress. She has wheezes (bilateral expiratory wheezes).  Dyspneic   Musculoskeletal: Normal range of motion.  Neurological: She is alert and oriented to person, place, and time.  Skin: Skin is warm and dry.  Psychiatric: She has a normal mood and affect. Her behavior is normal.    ED Course  Procedures (including critical care time)  Medications  albuterol (PROVENTIL) (2.5 MG/3ML) 0.083% nebulizer solution 5 mg (5 mg Nebulization Given 04/26/13 0746)  ipratropium (ATROVENT) nebulizer solution 0.5 mg (0.5 mg Nebulization Given 04/26/13 0746)  predniSONE (DELTASONE) tablet 60 mg (60 mg Oral Given 04/26/13 0740)  albuterol (PROVENTIL,VENTOLIN) solution continuous neb (0 mg/hr Nebulization Duplicate 08/25/77 4327)  albuterol (PROVENTIL) (2.5 MG/3ML) 0.083% nebulizer solution (10 mg  Given 04/26/13 0923)    DIAGNOSTIC STUDIES: Oxygen Saturation is 100% on RA, normal by my interpretation.    COORDINATION OF CARE: 7:28 AM-Reviewed CXR in the room. Informed pt that CXR was negative for PNA. Advised pt that her symptoms seem to be more like a viral lung infection. Discussed treatment plan of another breathing treatment in the ED and prednisone prescription.  Labs Review Labs Reviewed  BASIC METABOLIC PANEL - Abnormal; Notable for the following:    Glucose, Bld 302 (*)    Creatinine, Ser 1.60 (*)    GFR calc non Af Amer 31 (*)    GFR calc Af Amer 36 (*)    All other components within normal limits  CBC WITH DIFFERENTIAL - Abnormal; Notable for the following:    RBC 3.52 (*)    Hemoglobin 10.4 (*)    HCT 33.4 (*)    RDW 17.1 (*)    Monocytes Relative 15 (*)    All other components within normal limits   Imaging Review Dg Chest Port 1 View  04/26/2013   CLINICAL DATA:  Shortness of breath.  EXAM: PORTABLE CHEST - 1 VIEW  COMPARISON:   02/20/2013  FINDINGS: Right Port-A-Cath remains in place, unchanged. Mild cardiomegaly. No confluent airspace opacity, effusion or edema. No acute bony abnormality.  IMPRESSION: Mild cardiomegaly.  No active disease.   Electronically Signed   By: Rolm Baptise M.D.   On: 04/26/2013 07:26    EKG Interpretation    Date/Time:  Friday April 26 2013 06:56:47 EST Ventricular Rate:  128 PR Interval:  138 QRS Duration: 84 QT Interval:  296 QTC Calculation: 432 R Axis:   -28 Text Interpretation:  Sinus tachycardia with Premature atrial complexes Left ventricular hypertrophy with repolarization abnormality Cannot rule out Septal infarct , age undetermined Abnormal ECG When compared with ECG of 20-Feb-2013 09:00, Vent. rate has increased BY  55 BPM T wave inversion no longer evident in Inferior leads Confirmed by Annaliesa Blann  MD, Nairi Oswald ([id number]) on 04/26/2013 7:53:25 AM            MDM  No diagnosis found. Persistent wheezing and dyspnea.  CXR negative.  Nebulizer Rx times three.  PO prednisone.  Admit  I personally performed the services described in this documentation, which was scribed in my presence. The recorded information has been reviewed and is accurate.    Diane Christen, MD 04/26/13 1112

## 2013-04-26 NOTE — H&P (Signed)
[id number] 

## 2013-04-26 NOTE — Progress Notes (Signed)
04/26/13 1713 Patient reported to have CBG 506 per nurse tech. Borderline diabetes noted on history. Receiving IV solumedrol. Notified Dr. Luan Pulling (oncall for Dr. Cindie Laroche). Stated will change insulin coverage to resistant scale. MD to place orders. Donavan Foil, RN

## 2013-04-26 NOTE — Progress Notes (Signed)
04/26/13 1406 Patient noted to have HR sustaining in 130s. Denies pain or discomfort. States has not taken home medications today. Notified Dr. Cindie Laroche, stated to give bystolic 5 mg po as ordered, stat EKG. Influenza PCR ordered as well. Droplet precautions in place on admission. Pharmacy notified of need for bystolic as ordered. Donavan Foil, RN

## 2013-04-26 NOTE — ED Notes (Signed)
Pt states she feels like her breathing is better.  Audible wheezes auscultated.

## 2013-04-26 NOTE — Progress Notes (Signed)
04/26/13 1940 Late entry for 1850. Patient rechecked CBG 533 per nurse tech, after receiving novolog 20 units SQ this evening. Stat lab glucose ordered for verification. Lab glucose drawn, awaiting results. Night shift nurse to monitor and notify MD. Donavan Foil, RN

## 2013-04-27 LAB — GLUCOSE, CAPILLARY
GLUCOSE-CAPILLARY: 238 mg/dL — AB (ref 70–99)
GLUCOSE-CAPILLARY: 252 mg/dL — AB (ref 70–99)
Glucose-Capillary: 227 mg/dL — ABNORMAL HIGH (ref 70–99)
Glucose-Capillary: 232 mg/dL — ABNORMAL HIGH (ref 70–99)
Glucose-Capillary: 324 mg/dL — ABNORMAL HIGH (ref 70–99)

## 2013-04-27 LAB — BASIC METABOLIC PANEL
BUN: 25 mg/dL — ABNORMAL HIGH (ref 6–23)
CHLORIDE: 104 meq/L (ref 96–112)
CO2: 24 mEq/L (ref 19–32)
CREATININE: 1.69 mg/dL — AB (ref 0.50–1.10)
Calcium: 9.7 mg/dL (ref 8.4–10.5)
GFR calc Af Amer: 34 mL/min — ABNORMAL LOW (ref >90)
GFR calc non Af Amer: 29 mL/min — ABNORMAL LOW (ref >90)
GLUCOSE: 252 mg/dL — AB (ref 70–99)
POTASSIUM: 4.6 meq/L (ref 3.7–5.3)
Sodium: 137 mEq/L (ref 137–147)

## 2013-04-27 LAB — HEPATIC FUNCTION PANEL
ALBUMIN: 3.1 g/dL — AB (ref 3.5–5.2)
ALT: 10 U/L (ref 0–35)
AST: 9 U/L (ref 0–37)
Alkaline Phosphatase: 40 U/L (ref 39–117)
BILIRUBIN TOTAL: 0.5 mg/dL (ref 0.3–1.2)
Bilirubin, Direct: 0.2 mg/dL (ref 0.0–0.3)
Indirect Bilirubin: 0.3 mg/dL (ref 0.3–0.9)
Total Protein: 6.1 g/dL (ref 6.0–8.3)

## 2013-04-27 MED ORDER — SODIUM CHLORIDE 0.9 % IJ SOLN
10.0000 mL | Freq: Two times a day (BID) | INTRAMUSCULAR | Status: DC
  Administered 2013-04-27 – 2013-04-30 (×8): 10 mL via INTRAVENOUS

## 2013-04-27 MED ORDER — METHYLPREDNISOLONE SODIUM SUCC 125 MG IJ SOLR
80.0000 mg | Freq: Three times a day (TID) | INTRAMUSCULAR | Status: DC
  Administered 2013-04-27: 80 mg via INTRAVENOUS
  Administered 2013-04-27: 06:00:00 via INTRAVENOUS
  Administered 2013-04-27 – 2013-04-29 (×5): 80 mg via INTRAVENOUS
  Filled 2013-04-27 (×6): qty 2

## 2013-04-27 MED ORDER — GUAIFENESIN ER 600 MG PO TB12
1200.0000 mg | ORAL_TABLET | Freq: Two times a day (BID) | ORAL | Status: DC
Start: 2013-04-27 — End: 2013-05-01
  Administered 2013-04-27 – 2013-05-01 (×9): 1200 mg via ORAL
  Filled 2013-04-27 (×9): qty 2

## 2013-04-27 NOTE — Plan of Care (Signed)
Problem: Phase I Progression Outcomes Goal: Progress activity as tolerated unless otherwise ordered Outcome: Progressing 04/27/13 1517 patient up with assist, ambulates in room with nursing staff assist. Generalized weakness, unsteady gait at times. Instructed to call for assist and not get up on her own. Daughter at bedside this afternoon, assists with care. States will call for assist as needed. Call light kept within reach. bedalarm on for safety. Donavan Foil, RN

## 2013-04-27 NOTE — Progress Notes (Signed)
[id number] 

## 2013-04-27 NOTE — Progress Notes (Signed)
NAMEJENNET, SCROGGIN                 ACCOUNT NO.:  [id number]  MEDICAL RECORD NO.:  [id number]  LOCATION:  [facility]                         FACILITY:  [facility]  PHYSICIAN:  Unk Lightning, MDDATE OF BIRTH:  Dec 21, 1940  DATE OF PROCEDURE: DATE OF DISCHARGE:                                PROGRESS NOTE   The patient admitted with some reactive airway disease.  Yesterday, placed on steroids and DuoNeb nebulizer, less wheezing today.  She has what appears to be new found diabetes which was 300 on admission, however with Solu-Medrol and glucose was much higher being given sliding scale, with sliding scale averaging 232 glucose.  PHYSICAL EXAMINATION:  VITAL SIGNS:  Blood pressure 133/82, temperature 98, pulse 86 and regular, respiratory rate 22.  LUNGS:  Show minimal inspiratory and expiratory wheezes.  No rales, no rhonchi appreciable. HEART:  Regular rhythm.  After Bystolic, heart rate down to 86, she had been noncompliant with beta blockade, had tachycardia yesterday. ABDOMEN:  Soft, nontender.  Bowel sounds normoactive.  PLAN:  Right now, to decrease Solu-Medrol to 80.  Continue sliding scale.  Glucose will trigger a long-acting glucose depending on the numbers, off of steroids.     Unk Lightning, MD     RMD/MEDQ  D:  04/27/2013  T:  04/27/2013  Job:  [id number]

## 2013-04-27 NOTE — Progress Notes (Signed)
CBG 232 given 2200 scheduled medication including Levemir.

## 2013-04-27 NOTE — Consult Note (Signed)
Consult requested by: Dr. Lorriane Shire Consult requested for COPD exacerbation:  HPI: This is a 73 year old who came to the emergency department because of increasing shortness of breath. She has a history of multiple myeloma which is being treated. She also has a history of cardiac disease. She had previous CVA. She says she is still short of breath. She has no other new complaints. She's coughing nonproductively.  Past Medical History  Diagnosis Date  . Hypertension   . Arteriosclerotic cardiovascular disease (ASCVD)     Former patient of [facility] - PTCA apical LAD 2005  . Borderline diabetes     + proteinuria  . GERD (gastroesophageal reflux disease)     + constipation, gastroparesis  . Hyperlipidemia   . Female stress incontinence     H/o urinary tract infection  . Vertigo   . Insomnia   . Colon cancer     1995  . Multiple myeloma   . Osteoarthritis     cervical spine; status post surgery  . CVA (cerebral infarction) 01/2012  . Tubular adenoma of colon 01/09/12  . Gastroparesis 2009    abnl GES     Family History  Problem Relation Age of Onset  . Anesthesia problems Neg Hx   . Hypotension Neg Hx   . Malignant hyperthermia Neg Hx   . Pseudochol deficiency Neg Hx   . Lung disease Mother   . Heart disease Father   . Heart disease Brother   . Hypertension Brother   . Arthritis Daughter   . Seizures Daughter   . Multiple sclerosis Son   . Colon cancer Brother 12     History   Social History  . Marital Status: Divorced    Spouse Name: N/A    Number of Children: N/A  . Years of Education: N/A   Social History Main Topics  . Smoking status: Former Smoker -- 0.25 packs/day for 15 years    Types: Cigarettes    Quit date: 11/03/1990  . Smokeless tobacco: Never Used  . Alcohol Use: No  . Drug Use: No  . Sexual Activity: None   Other Topics Concern  . None   Social History Narrative  . None     ROS: She has not had any hemoptysis. No chest pain. She had some  fever she thinks and had some chills.    Objective: Vital signs in last 24 hours: Temp:  [98 F (36.7 C)-98.9 F (37.2 C)] 98.3 F (36.8 C) (01/03 0831) Pulse Rate:  [82-127] 82 (01/03 0831) Resp:  [18-32] 18 (01/03 0831) BP: (116-161)/(63-83) 146/63 mmHg (01/03 0831) SpO2:  [95 %-100 %] 100 % (01/03 0914) Weight change:  Last BM Date: 04/25/13  Intake/Output from previous day:    PHYSICAL EXAM She is awake and alert. She is obese. She has a Port-A-Cath in place on the right side. Her pupils are responsive. Her neck is supple. Mucous membranes are moist. Her chest shows some wheezing bilaterally. Her heart is regular without gallop. Her abdomen is soft obese without masses. She has no edema. Central nervous system examination is grossly intact.  Lab Results: Basic Metabolic Panel:  Recent Labs  04/26/13 0946 04/26/13 1915 04/27/13 0559  NA 139  --  137  K 4.3  --  4.6  CL 102  --  104  CO2 21  --  24  GLUCOSE 302* 516* 252*  BUN 23  --  25*  CREATININE 1.60*  --  1.69*  CALCIUM 10.2  --  9.7   Liver Function Tests:  Recent Labs  04/27/13 0559  AST 9  ALT 10  ALKPHOS 40  BILITOT 0.5  PROT 6.1  ALBUMIN 3.1*   No results found for this basename: LIPASE, AMYLASE,  in the last 72 hours No results found for this basename: AMMONIA,  in the last 72 hours CBC:  Recent Labs  04/26/13 0946  WBC 6.3  NEUTROABS 4.5  HGB 10.4*  HCT 33.4*  MCV 94.9  PLT 217   Cardiac Enzymes: No results found for this basename: CKTOTAL, CKMB, CKMBINDEX, TROPONINI,  in the last 72 hours BNP: No results found for this basename: PROBNP,  in the last 72 hours D-Dimer: No results found for this basename: DDIMER,  in the last 72 hours CBG:  Recent Labs  04/26/13 1839 04/26/13 1841 04/26/13 1900 04/26/13 2152 04/27/13 0039 04/27/13 0748  GLUCAP 494* 461* 533* 269* 232* 227*   Hemoglobin A1C:  Recent Labs  04/26/13 1202  HGBA1C 9.6*   Fasting Lipid Panel: No  results found for this basename: CHOL, HDL, LDLCALC, TRIG, CHOLHDL, LDLDIRECT,  in the last 72 hours Thyroid Function Tests: No results found for this basename: TSH, T4TOTAL, FREET4, T3FREE, THYROIDAB,  in the last 72 hours Anemia Panel: No results found for this basename: VITAMINB12, FOLATE, FERRITIN, TIBC, IRON, RETICCTPCT,  in the last 72 hours Coagulation: No results found for this basename: LABPROT, INR,  in the last 72 hours Urine Drug Screen: Drugs of Abuse  No results found for this basename: labopia, cocainscrnur, labbenz, amphetmu, thcu, labbarb    Alcohol Level: No results found for this basename: ETH,  in the last 72 hours Urinalysis: No results found for this basename: COLORURINE, APPERANCEUR, LABSPEC, PHURINE, GLUCOSEU, HGBUR, BILIRUBINUR, KETONESUR, PROTEINUR, UROBILINOGEN, NITRITE, LEUKOCYTESUR,  in the last 72 hours Misc. Labs:   ABGS: No results found for this basename: PHART, PCO2, PO2ART, TCO2, HCO3,  in the last 72 hours   MICROBIOLOGY: No results found for this or any previous visit (from the past 240 hour(s)).  Studies/Results: Dg Chest Port 1 View  04/26/2013   CLINICAL DATA:  Shortness of breath.  EXAM: PORTABLE CHEST - 1 VIEW  COMPARISON:  02/20/2013  FINDINGS: Right Port-A-Cath remains in place, unchanged. Mild cardiomegaly. No confluent airspace opacity, effusion or edema. No acute bony abnormality.  IMPRESSION: Mild cardiomegaly.  No active disease.   Electronically Signed   By: Rolm Baptise M.D.   On: 04/26/2013 07:26    Medications:  Prior to Admission:  Prescriptions prior to admission  Medication Sig Dispense Refill  . amLODipine (NORVASC) 5 MG tablet Take 1 tablet (5 mg total) by mouth daily.  30 tablet  2  . aspirin EC 81 MG tablet Take 81 mg by mouth daily.      . cholecalciferol (VITAMIN D) 1000 UNITS tablet Take 1,000 Units by mouth daily.      . CRESTOR 20 MG tablet Take 20 mg by mouth daily.       Marland Kitchen dexamethasone (DECADRON) 4 MG tablet Take  5 tablets (20 mg total) by mouth once a week. Wednesday  20 tablet  3  . docusate sodium (COLACE) 100 MG capsule Take 1 capsule (100 mg total) by mouth 2 (two) times daily.  60 capsule  4  . meloxicam (MOBIC) 7.5 MG tablet Take 7.5 mg by mouth daily.       . naproxen sodium (ANAPROX) 220 MG tablet Take 220 mg by mouth 2 (two) times daily with  a meal.      . nebivolol (BYSTOLIC) 5 MG tablet Take 5 mg by mouth daily.       Marland Kitchen omeprazole (PRILOSEC) 20 MG capsule Take 20 mg by mouth 2 (two) times daily.       Marland Kitchen oxybutynin (DITROPAN) 5 MG tablet Take 10 mg by mouth daily.      Marland Kitchen oxyCODONE-acetaminophen (PERCOCET/ROXICET) 5-325 MG per tablet Take 1 tablet by mouth every 6 (six) hours as needed. pain  60 tablet  0  . potassium chloride (K-DUR) 10 MEQ tablet Take 10 mEq by mouth 2 (two) times daily.      . valsartan (DIOVAN) 160 MG tablet Take 1 tablet (160 mg total) by mouth daily.  30 tablet  3  . temazepam (RESTORIL) 15 MG capsule Take 1 capsule (15 mg total) by mouth at bedtime as needed for sleep.  30 capsule  1   Scheduled: . amLODipine  5 mg Oral Daily  . aspirin EC  81 mg Oral Daily  . atorvastatin  20 mg Oral q1800  . cholecalciferol  1,000 Units Oral Daily  . docusate sodium  100 mg Oral BID  . enoxaparin (LOVENOX) injection  0.5 mg/kg Subcutaneous Daily  . guaiFENesin  1,200 mg Oral BID  . insulin aspart  0-20 Units Subcutaneous TID WC  . insulin detemir  5 Units Subcutaneous QHS  . ipratropium-albuterol  3 mL Nebulization Q4H WA  . irbesartan  150 mg Oral Daily  . meloxicam  7.5 mg Oral Daily  . methylPREDNISolone sodium succinate  80 mg Intravenous Q8H  . nebivolol  5 mg Oral Daily  . oxybutynin  10 mg Oral Daily  . pantoprazole  40 mg Oral Daily  . potassium chloride  10 mEq Oral BID  . sodium chloride  10 mL Intravenous Q12H   Continuous:  HQR:[id number], oxyCODONE-acetaminophen, temazepam  Assesment:she is admitted with COPD exacerbation. She apparently does not  have influenza. She is not coughing up sputum so I don't think she needs an antibiotic but I'm going to see how she does when we add flutter valve and Mucinex. She is better but not clear. She also has a new problem which is elevated blood sugar  Active Problems:   COPD exacerbation    Plan: Add Mucinex and flutter valve. Otherwise continue treatments.    LOS: 1 day   Almir Botts L 04/27/2013, 11:29 AM

## 2013-04-28 LAB — HEPATIC FUNCTION PANEL
ALBUMIN: 3 g/dL — AB (ref 3.5–5.2)
ALT: 13 U/L (ref 0–35)
AST: 11 U/L (ref 0–37)
Alkaline Phosphatase: 39 U/L (ref 39–117)
Total Bilirubin: 0.4 mg/dL (ref 0.3–1.2)
Total Protein: 5.9 g/dL — ABNORMAL LOW (ref 6.0–8.3)

## 2013-04-28 LAB — BASIC METABOLIC PANEL
BUN: 32 mg/dL — ABNORMAL HIGH (ref 6–23)
CO2: 25 mEq/L (ref 19–32)
Calcium: 9.6 mg/dL (ref 8.4–10.5)
Chloride: 102 mEq/L (ref 96–112)
Creatinine, Ser: 1.64 mg/dL — ABNORMAL HIGH (ref 0.50–1.10)
GFR calc Af Amer: 35 mL/min — ABNORMAL LOW (ref >90)
GFR calc non Af Amer: 30 mL/min — ABNORMAL LOW (ref >90)
GLUCOSE: 280 mg/dL — AB (ref 70–99)
POTASSIUM: 5.7 meq/L — AB (ref 3.7–5.3)
SODIUM: 137 meq/L (ref 137–147)

## 2013-04-28 LAB — GLUCOSE, CAPILLARY
Glucose-Capillary: 276 mg/dL — ABNORMAL HIGH (ref 70–99)
Glucose-Capillary: 261 mg/dL — ABNORMAL HIGH (ref 70–99)
Glucose-Capillary: 249 mg/dL — ABNORMAL HIGH (ref 70–99)
Glucose-Capillary: 311 mg/dL — ABNORMAL HIGH (ref 70–99)

## 2013-04-28 MED ORDER — CLONIDINE HCL 0.2 MG PO TABS
0.2000 mg | ORAL_TABLET | Freq: Two times a day (BID) | ORAL | Status: DC
  Administered 2013-04-28 – 2013-05-01 (×7): 0.2 mg via ORAL
  Filled 2013-04-28 (×7): qty 1

## 2013-04-28 MED ORDER — SODIUM POLYSTYRENE SULFONATE 15 GM/60ML PO SUSP
30.0000 g | Freq: Once | ORAL | Status: AC
  Administered 2013-04-28: 30 g via ORAL
  Filled 2013-04-28: qty 120

## 2013-04-28 NOTE — Progress Notes (Signed)
[name], [name]                 ACCOUNT NO.:  [id number]  MEDICAL RECORD NO.:  [id number]  LOCATION:  [facility]                         FACILITY:  [facility]  PHYSICIAN:  Unk Lightning, MDDATE OF BIRTH:  December 30, 1940  DATE OF PROCEDURE: DATE OF DISCHARGE:                                PROGRESS NOTE   SUBJECTIVE:  The patient has reactive airway disease, currently on intravenous steroids.  Has mild to moderate inspiratory and expiratory wheezing, on Solu-Medrol 80.  She has new onset diabetes.  Glycemic control is with sliding scale averaging 252-270.  She likewise had some acute renal failure.  Creatinine improving down to 1.64 with elevated potassium 5.7. The plan Right now is to give 30 g of Kayexalate, as well as stop ARBs and clonidine 0.2 b.i.d. for blood pressure control.  OBJECTIVE:  VITAL SIGNS:  Blood pressure 135/70, temperature 97.9, pulse 66 and regular, respiratory rate is 19. LUNGS:  As mentioned. HEART:  Regular rate and rhythm.  No S3 or S4.  No heaves, thrills, or rubs.  PLAN:  Right now is continue DuoNeb and IV Solu-Medrol 80 q.8 hours.  I will make further recommendations as the database expands.     Unk Lightning, MD     RMD/MEDQ  D:  04/28/2013  T:  04/28/2013  Job:  [id number]

## 2013-04-28 NOTE — Progress Notes (Signed)
[id number] 

## 2013-04-28 NOTE — Progress Notes (Signed)
04/28/13 1617 Patient reported "i just don't have any appetite". Ate small amount of lunch per daughter. Offered glucerna supplement. Patient tolerated glucerna well, no complaints. Patient up to chair this morning, tolerated sitting up well. bedalarm on for safety while in bed. Instructed to call for assist and not attempt getting up on her own. States will call, call light within reach. Donavan Foil, RN

## 2013-04-29 LAB — GLUCOSE, CAPILLARY
GLUCOSE-CAPILLARY: 324 mg/dL — AB (ref 70–99)
GLUCOSE-CAPILLARY: 219 mg/dL — AB (ref 70–99)
Glucose-Capillary: 309 mg/dL — ABNORMAL HIGH (ref 70–99)
Glucose-Capillary: 178 mg/dL — ABNORMAL HIGH (ref 70–99)

## 2013-04-29 LAB — BASIC METABOLIC PANEL
BUN: 35 mg/dL — ABNORMAL HIGH (ref 6–23)
CHLORIDE: 101 meq/L (ref 96–112)
CO2: 26 meq/L (ref 19–32)
CREATININE: 1.56 mg/dL — AB (ref 0.50–1.10)
Calcium: 8.7 mg/dL (ref 8.4–10.5)
GFR calc Af Amer: 37 mL/min — ABNORMAL LOW (ref >90)
GFR calc non Af Amer: 32 mL/min — ABNORMAL LOW (ref >90)
Glucose, Bld: 338 mg/dL — ABNORMAL HIGH (ref 70–99)
Potassium: 4.8 mEq/L (ref 3.7–5.3)
Sodium: 138 mEq/L (ref 137–147)

## 2013-04-29 LAB — HEPATIC FUNCTION PANEL
ALBUMIN: 2.7 g/dL — AB (ref 3.5–5.2)
ALT: 13 U/L (ref 0–35)
AST: 12 U/L (ref 0–37)
Alkaline Phosphatase: 38 U/L — ABNORMAL LOW (ref 39–117)
Bilirubin, Direct: <0.2 mg/dL (ref 0.0–0.3)
TOTAL PROTEIN: 5.6 g/dL — AB (ref 6.0–8.3)
Total Bilirubin: 0.5 mg/dL (ref 0.3–1.2)

## 2013-04-29 MED ORDER — METFORMIN HCL 500 MG PO TABS: 500.0000 mg | ORAL_TABLET | Freq: Two times a day (BID) | ORAL | Status: DC

## 2013-04-29 MED ORDER — LIVING WELL WITH DIABETES BOOK
Freq: Once | Status: AC
  Administered 2013-04-29: 12:00:00
  Filled 2013-04-29: qty 1

## 2013-04-29 MED ORDER — LEVOFLOXACIN IN D5W 500 MG/100ML IV SOLN
500.0000 mg | INTRAVENOUS | Status: DC
Start: 2013-04-29 — End: 2013-04-30
  Administered 2013-04-29: 500 mg via INTRAVENOUS
  Filled 2013-04-29 (×2): qty 100

## 2013-04-29 MED ORDER — PREDNISONE 20 MG PO TABS
50.0000 mg | ORAL_TABLET | Freq: Every day | ORAL | Status: DC
  Administered 2013-04-30 – 2013-05-01 (×2): 50 mg via ORAL
  Filled 2013-04-29: qty 2
  Filled 2013-04-29 (×2): qty 1
  Filled 2013-04-29: qty 2

## 2013-04-29 NOTE — Progress Notes (Signed)
Subjective: She says she doesn't feel any better. She still coughing but has not been able to produce any sputum. She does say that she is swallowing sputum  Objective: Vital signs in last 24 hours: Temp:  [97.2 F (36.2 C)-97.7 F (36.5 C)] 97.7 F (36.5 C) (01/05 0640) Pulse Rate:  [60-81] 60 (01/05 0640) Resp:  [20] 20 (01/05 0640) BP: (135-140)/(68-79) 135/79 mmHg (01/05 0640) SpO2:  [93 %-100 %] 100 % (01/05 0803) Weight change:  Last BM Date: 04/25/13  Intake/Output from previous day: 01/04 0701 - 01/05 0700 In: 730 [P.O.:720; I.V.:10] Out: 600 [Urine:600]  PHYSICAL EXAM General appearance: alert, cooperative, mild distress and morbidly obese Resp: wheezes bilaterally Cardio: regular rate and rhythm, S1, S2 normal, no murmur, click, rub or gallop GI: soft, non-tender; bowel sounds normal; no masses,  no organomegaly Extremities: extremities normal, atraumatic, no cyanosis or edema  Lab Results:    Basic Metabolic Panel:  Recent Labs  04/28/13 0548 04/29/13 0511  NA 137 138  K 5.7* 4.8  CL 102 101  CO2 25 26  GLUCOSE 280* 338*  BUN 32* 35*  CREATININE 1.64* 1.56*  CALCIUM 9.6 8.7   Liver Function Tests:  Recent Labs  04/28/13 0548 04/29/13 0511  AST 11 12  ALT 13 13  ALKPHOS 39 38*  BILITOT 0.4 0.5  PROT 5.9* 5.6*  ALBUMIN 3.0* 2.7*   No results found for this basename: LIPASE, AMYLASE,  in the last 72 hours No results found for this basename: AMMONIA,  in the last 72 hours CBC:  Recent Labs  04/26/13 0946  WBC 6.3  NEUTROABS 4.5  HGB 10.4*  HCT 33.4*  MCV 94.9  PLT 217   Cardiac Enzymes: No results found for this basename: CKTOTAL, CKMB, CKMBINDEX, TROPONINI,  in the last 72 hours BNP: No results found for this basename: PROBNP,  in the last 72 hours D-Dimer: No results found for this basename: DDIMER,  in the last 72 hours CBG:  Recent Labs  04/27/13 1654 04/27/13 2049 04/28/13 0755 04/28/13 1146 04/28/13 1636  04/28/13 2055  GLUCAP 238* 252* 276* 261* 249* 311*   Hemoglobin A1C:  Recent Labs  04/26/13 1202  HGBA1C 9.6*   Fasting Lipid Panel: No results found for this basename: CHOL, HDL, LDLCALC, TRIG, CHOLHDL, LDLDIRECT,  in the last 72 hours Thyroid Function Tests: No results found for this basename: TSH, T4TOTAL, FREET4, T3FREE, THYROIDAB,  in the last 72 hours Anemia Panel: No results found for this basename: VITAMINB12, FOLATE, FERRITIN, TIBC, IRON, RETICCTPCT,  in the last 72 hours Coagulation: No results found for this basename: LABPROT, INR,  in the last 72 hours Urine Drug Screen: Drugs of Abuse  No results found for this basename: labopia, cocainscrnur, labbenz, amphetmu, thcu, labbarb    Alcohol Level: No results found for this basename: ETH,  in the last 72 hours Urinalysis: No results found for this basename: COLORURINE, APPERANCEUR, LABSPEC, PHURINE, GLUCOSEU, HGBUR, BILIRUBINUR, KETONESUR, PROTEINUR, UROBILINOGEN, NITRITE, LEUKOCYTESUR,  in the last 72 hours Misc. Labs:  ABGS No results found for this basename: PHART, PCO2, PO2ART, TCO2, HCO3,  in the last 72 hours CULTURES No results found for this or any previous visit (from the past 240 hour(s)). Studies/Results: No results found.  Medications:  Scheduled: . amLODipine  5 mg Oral Daily  . aspirin EC  81 mg Oral Daily  . atorvastatin  20 mg Oral q1800  . cholecalciferol  1,000 Units Oral Daily  . cloNIDine  0.2 mg  Oral BID  . docusate sodium  100 mg Oral BID  . enoxaparin (LOVENOX) injection  0.5 mg/kg Subcutaneous Daily  . guaiFENesin  1,200 mg Oral BID  . insulin aspart  0-20 Units Subcutaneous TID WC  . insulin detemir  5 Units Subcutaneous QHS  . ipratropium-albuterol  3 mL Nebulization Q4H WA  . levofloxacin (LEVAQUIN) IV  500 mg Intravenous Q24H  . meloxicam  7.5 mg Oral Daily  . methylPREDNISolone sodium succinate  80 mg Intravenous Q8H  . nebivolol  5 mg Oral Daily  . oxybutynin  10 mg Oral  Daily  . pantoprazole  40 mg Oral Daily  . sodium chloride  10 mL Intravenous Q12H   Continuous:  NIO:[id number], oxyCODONE-acetaminophen, temazepam  Assesment: She is admitted with COPD exacerbation. She has not improved a great deal by her symptoms but she is moving air better. I think is reasonable at this point to add an antibiotic because I think she is starting to have some sputum Active Problems:   COPD exacerbation    Plan: Add Levaquin    LOS: 3 days   Kam Rahimi L 04/29/2013, 8:43 AM

## 2013-04-29 NOTE — Progress Notes (Addendum)
Inpatient Diabetes Program Recommendations  AACE/ADA: New Consensus Statement on Inpatient Glycemic Control (2013)  Target Ranges:  Prepandial:   less than 140 mg/dL      Peak postprandial:   less than 180 mg/dL (1-2 hours)      Critically ill patients:  140 - 180 mg/dL   Results for [name], [name] (MRN [id number]) as of 04/29/2013 11:14  Ref. Range 04/28/2013 07:55 04/28/2013 11:46 04/28/2013 16:36 04/28/2013 20:55 04/29/2013 07:43  Glucose-Capillary Latest Range: 70-99 mg/dL 276 (H) 261 (H) 249 (H) 311 (H) 324 (H)  Results for [name], [name] (MRN [id number]) as of 04/29/2013 11:14  Ref. Range 04/26/2013 12:02  Hemoglobin A1C Latest Range: <5.7 % 9.6 (H)    Inpatient Diabetes Program Recommendations Insulin - Basal: Please consider increasing Levemir to 15 units QHS. Correction (SSI): Please add Novolog bedtime correction scale. Insulin - Meal Coverage: Please consider ordering Novolog 4 units TID with meals for meal coverage while on steroids.  Note: Patient does not have a prior documented history of diabetes and does not take any medications as an outpatient for diabetes.  Noted A1C 9.6 % on 04/26/13; also noted patient takes Decadron 20 mg every week which could be likely cause of hyperglycemia (? drug induced new onset diabetes).  Blood glucose over the past 24 hours has ranged from 249-338 mg/dl and fasting glucose this morning was 324 mg/dl.  Please increase Levemir to 15 units QHS, add Novolog bedtime correction scale, and order Novolog 4 units TID with meals (while on steroids).  Will continue to follow.  04/29/2013 @ 14:00 - Spoke with patient and her daughter about new diabetes diagnosis. Discussed A1C results (9.6% on 04/26/2013) and explained what an A1C is, basic pathophysiology of DM Type 2, basic home care, importance of checking CBGs and maintaining good CBG control to prevent long-term and short-term complications. Explained impact of medications (steroids) on glycemic control.  Patient states that  "I do not have diabetes so stop telling me I do."  Again explained how the A1C indicates that she has diabetes which may be due to chronic steroids. At times during our conversation, patient states that she understands what she is being told but she is forgetful.  Patient prefers that her daughter be taught about what needs to be done for her regarding her blood sugar.  Talked with Dr. Cindie Laroche regarding plan for discharge diabetes management and at this point, the plan is likely to discharge on oral agents for diabetes control.  RNs to provide ongoing basic DM education at bedside with this patient and engage patient to actively check blood glucose and administer insulin injections. Have ordered educational booklet, RD consult, and DM videos.  Patient's daughter verbalized understanding and reports that she does not have any further questions at this time.    Thanks, Barnie Alderman, RN, MSN, CCRN Diabetes Coordinator Inpatient Diabetes Program [phone] (Team Pager) [phone] ([facility] office) [phone] [facility] office)

## 2013-04-29 NOTE — Care Management Note (Addendum)
    Page 1 of 2   05/01/2013     1:29:46 PM   CARE MANAGEMENT NOTE 05/01/2013  Patient:  [name], [name]   Account Number:  [id number]  Date Initiated:  04/29/2013  Documentation initiated by:  Theophilus Kinds  Subjective/Objective Assessment:   Pt admitted from home with COPD. Pt lives with her daughter who is her CAP aide. Pt has a BSC and walker for home use. Pt would like shower chair for home use.     Action/Plan:   Will need to assess need for home O2 prior to discharge. Will obtain order for shower chair and give script to pt to pick up at facility of choice. Will also monitor for possible need for [facility].   Anticipated DC Date:  05/01/2013   Anticipated DC Plan:  HOME/SELF CARE      DC Planning Services  CM consult      PAC Choice  DURABLE MEDICAL EQUIPMENT   Choice offered to / List presented to:  C-4 Adult Children   DME arranged  [facility]      DME agency  [facility]        Status of service:  Completed, signed off Medicare Important Message given?  YES (If response is "NO", the following Medicare IM given date fields will be blank) Date Medicare IM given:  05/01/2013 Date Additional Medicare IM given:    Discharge Disposition:  HOME/SELF CARE  Per UR Regulation:    If discussed at Long Length of Stay Meetings, dates discussed:    Comments:  05/01/13 [place], RN BSN CM Pt discharged home today with home O2 and neb machine from [employer]. Portable tank will be delivered to pts room prior to discharge and will deliver concentrator to pts home after discharge. Pt and pts nurse aware of discharge arrangements. 04/29/13 [place], RN BSN CM

## 2013-04-29 NOTE — Plan of Care (Signed)
Problem: Food- and Nutrition-Related Knowledge Deficit (NB-1.1) Goal: Nutrition education Formal process to instruct or train a patient/client in a skill or to impart knowledge to help patients/clients voluntarily manage or modify food choices and eating behavior to maintain or improve health. Outcome: Completed/Met Date Met:  04/29/13  RD consulted for nutrition education regarding diabetes.     Lab Results  Component Value Date    HGBA1C 9.6* 04/26/2013    RD provided "Carbohydrate Counting for People with Diabetes" handout from the Academy of Nutrition and Dietetics. Discussed different food groups and their effects on blood sugar, emphasizing carbohydrate-containing foods. Provided list of carbohydrates and recommended serving sizes of common foods.  Discussed importance of controlled and consistent carbohydrate intake throughout the day. Provided examples of ways to balance meals/snacks and encouraged intake of high-fiber, whole grain complex carbohydrates. Teach back method used.  Expect fair compliance.  Body mass index is 44.92 kg/(m^2). Pt meets criteria for extreme obesity, class III based on current BMI.  Current diet order is carb modified, patient is consuming approximately 50-75% of meals at this time. Labs and medications reviewed. No further nutrition interventions warranted at this time. RD contact information provided. If additional nutrition issues arise, please re-consult RD.  Conda Wannamaker A. Jimmye Norman, RD, LDN Pager: [phone]

## 2013-04-29 NOTE — Progress Notes (Signed)
[id number] 

## 2013-04-29 NOTE — Progress Notes (Signed)
UR chart review completed.  

## 2013-04-29 NOTE — Progress Notes (Signed)
[name], [name]                 ACCOUNT NO.:  [id number]  MEDICAL RECORD NO.:  [id number]  LOCATION:                                 FACILITY:  PHYSICIAN:  Sammi Stolarz L. Luan Pulling, M.D.DATE OF BIRTH:  07/01/1940  DATE OF PROCEDURE:  04/28/2013 DATE OF DISCHARGE:                                PROGRESS NOTE   Patient of Dr. Cindie Laroche.  SUBJECTIVE:  This patient is being seen in consultation for COPD exacerbation.  She says she feels a little better.  She is still coughing and wheezing, however.  She has no other new complaints.  PHYSICAL EXAMINATION:  VITAL SIGNS:  Temperature is 97.9, pulse 66, respirations 19, blood pressure 135/70, O2 sats 98%.  CHEST:  Relatively clear with some wheezing. HEART:  Regular without gallop. ABDOMEN:  Soft. EXTREMITIES:  No edema. CENTRAL NERVOUS SYSTEM:  Grossly intact.  Her blood sugar is still somewhat elevated.  I have reviewed her medications.  ASSESSMENT:  She has chronic obstructive pulmonary disease exacerbation. I think she is better.  She has a new problem, which is diabetes.  She has multiple myeloma which is being treated.  PLAN:  Continue with current treatments.  She does seem to be improving, but it is low.     Tilly Pernice L. Luan Pulling, M.D.     ELH/MEDQ  D:  04/28/2013  T:  04/29/2013  Job:  [id number]

## 2013-04-30 LAB — BASIC METABOLIC PANEL
BUN: 35 mg/dL — AB (ref 6–23)
CO2: 27 mEq/L (ref 19–32)
Calcium: 9.1 mg/dL (ref 8.4–10.5)
Chloride: 101 mEq/L (ref 96–112)
Creatinine, Ser: 1.48 mg/dL — ABNORMAL HIGH (ref 0.50–1.10)
GFR calc Af Amer: 40 mL/min — ABNORMAL LOW (ref >90)
GFR, EST NON AFRICAN AMERICAN: 34 mL/min — AB (ref >90)
Glucose, Bld: 207 mg/dL — ABNORMAL HIGH (ref 70–99)
POTASSIUM: 4.7 meq/L (ref 3.7–5.3)
SODIUM: 138 meq/L (ref 137–147)

## 2013-04-30 LAB — GLUCOSE, CAPILLARY
GLUCOSE-CAPILLARY: 267 mg/dL — AB (ref 70–99)
GLUCOSE-CAPILLARY: 267 mg/dL — AB (ref 70–99)
Glucose-Capillary: 179 mg/dL — ABNORMAL HIGH (ref 70–99)
Glucose-Capillary: 212 mg/dL — ABNORMAL HIGH (ref 70–99)

## 2013-04-30 MED ORDER — LEVOFLOXACIN 500 MG PO TABS
500.0000 mg | ORAL_TABLET | Freq: Every day | ORAL | Status: DC
  Administered 2013-04-30 – 2013-05-01 (×2): 500 mg via ORAL
  Filled 2013-04-30 (×2): qty 1

## 2013-04-30 NOTE — Progress Notes (Signed)
NAMENAYDA, RIESEN                 ACCOUNT NO.:  [id number]  MEDICAL RECORD NO.:  [id number]  LOCATION:  [facility]                         FACILITY:  [facility]  PHYSICIAN:  Unk Lightning, MDDATE OF BIRTH:  [birth year]  DATE OF PROCEDURE: DATE OF DISCHARGE:                                PROGRESS NOTE   SUBJECTIVE:  The patient has reactive airway disease, somewhat improved today over the preceding 24 hours.  Had hyperkalemia given Kayexalate down to 4.8 today.  Had renal failure now diminishing with a creatinine of 1.56 after aggressive fluid resuscitation.  She likewise has new onset diabetes.  Currently on steroids 80 mg Solu-Medrol IV q.8 hours. Glucose is averaging a.c. and h.s. approximately 300.  Partially mediated by steroids.  OBJECTIVE:  VITAL SIGNS:  Blood pressure 135/79, temperature 97.7, pulse 60 and regular, respiratory rate is 20, O2 sat 99% on 2 L. LUNGS:  Show minimal inspiratory and expiratory wheezing.  No rales, no rhonchi. HEART:  Regular rate and rhythm.  No murmurs, gallops, or rubs. ABDOMEN:  Soft, nontender.  Bowel sounds normoactive.  No guarding, rebound, mass, or megaly.  PLAN:  Right now is to add metformin 500 mg p.o. b.i.d.  Likewise we will monitor glucoses.  Will diminish to oral steroids to see if the patient improves.     Unk Lightning, MD     RMD/MEDQ  D:  04/29/2013  T:  04/30/2013  Job:  [id number]

## 2013-04-30 NOTE — Progress Notes (Signed)
[id number] 

## 2013-04-30 NOTE — Progress Notes (Signed)
Patient's room air saturation 86 percent on room air,oxygen reapplied at 2 liters nasal canula.

## 2013-04-30 NOTE — Progress Notes (Signed)
Subjective: She states she feels much better. She still has some cough.  Objective: Vital signs in last 24 hours: Temp:  [97.6 F (36.4 C)-97.8 F (36.6 C)] 97.8 F (36.6 C) (01/06 0552) Pulse Rate:  [76] 76 (01/06 0552) Resp:  [20] 20 (01/06 0552) BP: (130-133)/(70-78) 130/78 mmHg (01/06 0552) SpO2:  [91 %-100 %] 94 % (01/06 0705) Weight change:  Last BM Date: 04/29/13  Intake/Output from previous day: 01/05 0701 - 01/06 0700 In: 220 [P.O.:120; IV Piggyback:100] Out: -   PHYSICAL EXAM General appearance: alert, cooperative and no distress Resp: wheezes bilaterally and The wheezes are much less than previously Cardio: regular rate and rhythm, S1, S2 normal, no murmur, click, rub or gallop GI: soft, non-tender; bowel sounds normal; no masses,  no organomegaly Extremities: extremities normal, atraumatic, no cyanosis or edema  Lab Results:    Basic Metabolic Panel:  Recent Labs  04/29/13 0511 04/30/13 0518  NA 138 138  K 4.8 4.7  CL 101 101  CO2 26 27  GLUCOSE 338* 207*  BUN 35* 35*  CREATININE 1.56* 1.48*  CALCIUM 8.7 9.1   Liver Function Tests:  Recent Labs  04/28/13 0548 04/29/13 0511  AST 11 12  ALT 13 13  ALKPHOS 39 38*  BILITOT 0.4 0.5  PROT 5.9* 5.6*  ALBUMIN 3.0* 2.7*   No results found for this basename: LIPASE, AMYLASE,  in the last 72 hours No results found for this basename: AMMONIA,  in the last 72 hours CBC: No results found for this basename: WBC, NEUTROABS, HGB, HCT, MCV, PLT,  in the last 72 hours Cardiac Enzymes: No results found for this basename: CKTOTAL, CKMB, CKMBINDEX, TROPONINI,  in the last 72 hours BNP: No results found for this basename: PROBNP,  in the last 72 hours D-Dimer: No results found for this basename: DDIMER,  in the last 72 hours CBG:  Recent Labs  04/28/13 2055 04/29/13 0743 04/29/13 1219 04/29/13 1649 04/29/13 2120 04/30/13 0752  GLUCAP 311* 324* 309* 219* 178* 179*   Hemoglobin A1C: No results  found for this basename: HGBA1C,  in the last 72 hours Fasting Lipid Panel: No results found for this basename: CHOL, HDL, LDLCALC, TRIG, CHOLHDL, LDLDIRECT,  in the last 72 hours Thyroid Function Tests: No results found for this basename: TSH, T4TOTAL, FREET4, T3FREE, THYROIDAB,  in the last 72 hours Anemia Panel: No results found for this basename: VITAMINB12, FOLATE, FERRITIN, TIBC, IRON, RETICCTPCT,  in the last 72 hours Coagulation: No results found for this basename: LABPROT, INR,  in the last 72 hours Urine Drug Screen: Drugs of Abuse  No results found for this basename: labopia, cocainscrnur, labbenz, amphetmu, thcu, labbarb    Alcohol Level: No results found for this basename: ETH,  in the last 72 hours Urinalysis: No results found for this basename: COLORURINE, APPERANCEUR, LABSPEC, PHURINE, GLUCOSEU, HGBUR, BILIRUBINUR, KETONESUR, PROTEINUR, UROBILINOGEN, NITRITE, LEUKOCYTESUR,  in the last 72 hours Misc. Labs:  ABGS No results found for this basename: PHART, PCO2, PO2ART, TCO2, HCO3,  in the last 72 hours CULTURES No results found for this or any previous visit (from the past 240 hour(s)). Studies/Results: No results found.  Medications:  Scheduled: . amLODipine  5 mg Oral Daily  . aspirin EC  81 mg Oral Daily  . atorvastatin  20 mg Oral q1800  . cholecalciferol  1,000 Units Oral Daily  . cloNIDine  0.2 mg Oral BID  . docusate sodium  100 mg Oral BID  . enoxaparin (LOVENOX)  injection  0.5 mg/kg Subcutaneous Daily  . guaiFENesin  1,200 mg Oral BID  . insulin aspart  0-20 Units Subcutaneous TID WC  . insulin detemir  5 Units Subcutaneous QHS  . ipratropium-albuterol  3 mL Nebulization Q4H WA  . levofloxacin (LEVAQUIN) IV  500 mg Intravenous Q24H  . meloxicam  7.5 mg Oral Daily  . nebivolol  5 mg Oral Daily  . oxybutynin  10 mg Oral Daily  . pantoprazole  40 mg Oral Daily  . predniSONE  50 mg Oral Q breakfast  . sodium chloride  10 mL Intravenous Q12H    Continuous:  OZD:[id number], oxyCODONE-acetaminophen, temazepam  Assesment: She is better from her COPD exacerbation. She has developed diabetes. She is still on IV medications so I would like to stop this.  Active Problems:   COPD exacerbation    Plan: Discontinue IV medications    LOS: 4 days   Kellen Dutch L 04/30/2013, 8:40 AM

## 2013-04-30 NOTE — Progress Notes (Signed)
Patient's oxygen saturation on room air was 86%.  Placed pt back on 2 lpm ncan.

## 2013-05-01 LAB — BASIC METABOLIC PANEL
BUN: 32 mg/dL — ABNORMAL HIGH (ref 6–23)
CHLORIDE: 100 meq/L (ref 96–112)
CO2: 27 mEq/L (ref 19–32)
CREATININE: 1.39 mg/dL — AB (ref 0.50–1.10)
Calcium: 9.7 mg/dL (ref 8.4–10.5)
GFR calc Af Amer: 43 mL/min — ABNORMAL LOW (ref >90)
GFR calc non Af Amer: 37 mL/min — ABNORMAL LOW (ref >90)
Glucose, Bld: 202 mg/dL — ABNORMAL HIGH (ref 70–99)
Potassium: 4.3 mEq/L (ref 3.7–5.3)
Sodium: 138 mEq/L (ref 137–147)

## 2013-05-01 LAB — GLUCOSE, CAPILLARY
GLUCOSE-CAPILLARY: 193 mg/dL — AB (ref 70–99)
Glucose-Capillary: 177 mg/dL — ABNORMAL HIGH (ref 70–99)

## 2013-05-01 MED ORDER — PREDNISONE 50 MG PO TABS: ORAL_TABLET | ORAL | Status: DC

## 2013-05-01 MED ORDER — HEPARIN SOD (PORK) LOCK FLUSH 100 UNIT/ML IV SOLN
500.0000 [IU] | INTRAVENOUS | Status: DC | PRN
  Filled 2013-05-01: qty 5

## 2013-05-01 MED ORDER — CLONIDINE HCL 0.2 MG PO TABS: 0.2000 mg | ORAL_TABLET | Freq: Two times a day (BID) | ORAL | Status: AC

## 2013-05-01 MED ORDER — IPRATROPIUM-ALBUTEROL 0.5-2.5 (3) MG/3ML IN SOLN: 3.0000 mL | RESPIRATORY_TRACT | Status: AC

## 2013-05-01 NOTE — Progress Notes (Signed)
Inpatient Diabetes Program Recommendations  AACE/ADA: New Consensus Statement on Inpatient Glycemic Control (2013)  Target Ranges:  Prepandial:   less than 140 mg/dL      Peak postprandial:   less than 180 mg/dL (1-2 hours)      Critically ill patients:  140 - 180 mg/dL   Results for JEM, CASTRO (MRN [id number]) as of 05/01/2013 10:35  Ref. Range 04/30/2013 07:52 04/30/2013 11:37 04/30/2013 16:39 04/30/2013 21:15 05/01/2013 08:05  Glucose-Capillary Latest Range: 70-99 mg/dL 179 (H) 267 (H) 212 (H) 267 (H) 177 (H)   Inpatient Diabetes Program Recommendations Insulin - Basal: Please consider increasing Levemir to 7 units QHS. Correction (SSI): Please consider adding Novolog bedtime correction scale. Insulin - Meal Coverage: While on steroids, please consider ordering Novolog 4 units TID with meals for meal coverage.  Thanks, Barnie Alderman, RN, MSN, CCRN Diabetes Coordinator Inpatient Diabetes Program [phone] (Team Pager) [phone] ([facility] office) [phone] [facility] office)

## 2013-05-01 NOTE — Progress Notes (Signed)
NAME:  [name], [name]                      ACCOUNT NO.:  MEDICAL RECORD NO.:  [id number]  LOCATION:                                 FACILITY:  PHYSICIAN:  Unk Lightning, MDDATE OF BIRTH:  1940/05/14  DATE OF PROCEDURE: DATE OF DISCHARGE:                                PROGRESS NOTE   SUBJECTIVE:  The patient had new onset reactive airway disease, as well as new onset insulin-dependent diabetes.  Had been placed on steroids for her control of bronchospasm.  Her blood glucoses ranged, however, from 300 and higher.  She was subsequently reduced, placed on oral steroids 50 mg of prednisone a day and her glucoses ranged from 200-250s a.c. and at bedtime.  She was given DuoNeb nebulizer and hemodynamically was stable all throughout her hospital stay.  She had no evidence of angina or anginal equivalents, pain, no evidence of CHF.  She has some acute on chronic renal failure upon admission.  This was treated with aggressive fluid resuscitation, and creatinine dipped down from 2.7 to 1.48.  She seems to be doing well.  OBJECTIVE:  LUNGS:  Showed minimal inspiratory and expiratory wheeze. HEART:  Regular rhythm.  No S3, S4.  No heaves, thrills, or rubs. ABDOMEN:  Soft, nontender.  Bowel sounds are normoactive.  PLAN:  Discharge within 24-48 hours.     Unk Lightning, MD     RMD/MEDQ  D:  04/30/2013  T:  05/01/2013  Job:  [id number]

## 2013-05-01 NOTE — Discharge Summary (Signed)
[id number] 

## 2013-05-01 NOTE — Progress Notes (Signed)
Pt is to be discharged home today. Pt is in NAD, port-o-cath has been deaccessed, all paperwork has been reviewed/discussed with patient, and there are no questions/concerns at this time. Assessment is unchanged from this morning. Pt is to be accompanied downstairs by staff and family via wheelchair.

## 2013-05-02 NOTE — Discharge Summary (Signed)
NAME:  [name], [name]                 ACCOUNT NO.:  [id number]  MEDICAL RECORD NO.:  [id number]  LOCATION:  [facility]                         FACILITY:  [facility]  PHYSICIAN:  Unk Lightning, MDDATE OF BIRTH:  1941/04/24  DATE OF ADMISSION:  04/26/2013 DATE OF DISCHARGE:  [place]                              DISCHARGE SUMMARY   A 73 year old black female with known hypertension, hyperlipidemia, degenerative joint disease, osteoporosis, hypertension, anxiety who complained of increasing dyspnea for 36-hour period prior to coming into the hospital.  She was found have new component of reactive airway disease.  She has normal systolic ventricular function by echo.  Chest x- ray was essentially clear, no evidence of infiltrate.  She tested negative for flu.  She had some presumed irritant or viral illness which was treated with intravenous steroids as well as DuoNeb nebulizer. Likewise, she has new onset diabetes and with the addition of steroids, her sugar was poorly controlled, with the cessation and diminution of steroids, her sugars were better under control.  She was placed on metformin 500 p.o. b.i.d.  Renal function was 2.87, upon admission was 1.4, upon discharge, with aggressive fluid resuscitation after ATN or prerenal azotemia.  The patient was given 2 L nasal O2, and likewise DuoNeb nebulizer and was subsequently discharged on the following medicines.  Norvasc 5 mg p.o. daily, aspirin 81 mg p.o. daily, vitamin D 1000 units p.o. daily, Crestor 20 mg p.o. daily, Colace 100 mg p.o. daily, Mobic 7.5 mg p.o. daily p.r.n., clonidine 0.2 mg p.o. daily, DuoNeb nebulizer 0.5-2.5 mg in 3 mL solution q.4h p.r.n., prednisone 50 mg tab 1/2 p.o. daily for 7 days then stop, Naprosyn 220 p.o. daily Bystolic 5 mg p.o. daily, prednisone 20 mg p.o. daily, oxybutynin 5 mg p.o. daily, potassium chloride 10 mEq p.o. daily, temazepam 15 mg p.o. at bedtime p.r.n. and valsartan 160 p.o. daily.  The patient  will follow up in the office in 3-4 days time for assessing renal function, reactive airway disease, and control of sugars.     Unk Lightning, MD     RMD/MEDQ  D:  05/01/2013  T:  05/02/2013  Job:  [id number]

## 2013-05-08 ENCOUNTER — Other Ambulatory Visit (HOSPITAL_COMMUNITY): Payer: PRIVATE HEALTH INSURANCE

## 2013-05-15 ENCOUNTER — Other Ambulatory Visit (HOSPITAL_COMMUNITY): Payer: Self-pay | Admitting: Oncology

## 2013-05-15 DIAGNOSIS — C9000 Multiple myeloma not having achieved remission: Secondary | ICD-10-CM

## 2013-05-15 MED ORDER — DEXAMETHASONE 4 MG PO TABS: 20.0000 mg | ORAL_TABLET | ORAL | Status: DC

## 2013-05-15 MED ORDER — POMALIDOMIDE 4 MG PO CAPS: 4.0000 mg | ORAL_CAPSULE | Freq: Every day | ORAL | Status: DC

## 2013-05-28 NOTE — Progress Notes (Signed)
Diane Curet, MD [place] [address]  MULTIPLE  MYELOMA  CURRENT THERAPY:Pomalyst 4 mg 21 days on and 7 days off and Dexamethasone 20 mg every Wednesday (reduced from 40 mg due to poorly controlled glucose)  INTERVAL HISTORY: [name] 73 y.o. 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 She developed overt myeloma 07/16/2009 with IgG lambda M spike and she also had at one time an IgG kappa spike. As of April she has no detectable M spike. She is now on pomalidomide 21 days on 7 days off with excellent control and tolerability. She also uses 20 mg of dexamethasone on Wednesdays of every week. We had reduced her dose of dexamethasone to this level because of issues with her sugars.   [name] was admitted to the [facility] from 1/2 through 05/02/2013.  She is noted to have a new component of reactive airway disease with a normal systolic ventricular function by echocardiogram.   Chart is reviewed.  I personally reviewed and went over laboratory results with the patient.  The results are noted within this dictation.  [name] is seen today in the clinic appearing weak.  She is not eating or drinking.  She is now incontinent of urine and bowel.  She acts confused at time.  She does not watch television any longer.  Additionally, she has clinical progression of disease with a left clavicular mass and mediastinal mass. She does not have any cranial masses or weakness on physical exam.    Jasmane is failing to thrive and with these masses, her multiple myeloma is refractory to therapy.  She was asked to stop her Pomalyst and weekly Dexamethasone.  She is agreeable to this.    She is now comfort care.  She denies any pain, nausea, or vomiting.  She denies any complaints other than decreased appetite.  I have offered her an appetite stimulants, but she declines; "It's not going to save my life."  I  broached the topic of Hospice, but the daughter interjected saying, "they take over everything and I would rather care for her myself."  I tried to broach the subject again, but the daughter again was not interested.  I briefly discussed the benefits of their service to [facility] and the family, but the daughter adamantly declined saying, "I see what they do in the nursing home and they take over everything."  [name]'s sister, [name] who accompanied [name] today also interjected, "They just give you morphine and that's it."  I tried to educate the patient and family about Hospice, but it was futile.    I was asked about prognosis which is terminal and also life expectancy which is less than 6 months, but in Carnella's present condition, I suspect less than 3 months.  We discussed palliative interventions that we can offer, but [name] wanted to go home.  I have offered my services to [facility] and the family and encouraged them to contact us with issues that we may be able to assist with.  [name] declined a follow-up appointment with [place].   Past Medical History  Diagnosis Date  . Hypertension   . Arteriosclerotic cardiovascular disease (ASCVD)     Former patient of [place] - PTCA apical LAD 2005  . Borderline diabetes     + proteinuria  . GERD (gastroesophageal reflux disease)     + constipation, gastroparesis  . Hyperlipidemia   .  Female stress incontinence     H/o urinary tract infection  . Vertigo   . Insomnia   . Colon cancer     1995  . Multiple myeloma   . Osteoarthritis     cervical spine; status post surgery  . CVA (cerebral infarction) 01/2012  . Tubular adenoma of colon 01/09/12  . Gastroparesis 2009    abnl GES    has MULTIPLE  MYELOMA; Anemia, normocytic normochromic; Obesity, Class II, BMI 35-39.9, with comorbidity; Hypertension; Arteriosclerotic cardiovascular disease (ASCVD); Borderline diabetes; GERD (gastroesophageal reflux disease); Hyperlipidemia; Cerebrovascular disease; Tubular adenoma of  colon; Chronic constipation; Muscle weakness (generalized); Difficulty in walking(719.7); and COPD exacerbation on her problem list.     is allergic to gabapentin and simvastatin.  Ms. Kitner does not currently have medications on file.  Past Surgical History  Procedure Laterality Date  . Partial colectomy  1995     [facility], colon cancer  . Abdominal hysterectomy  1970s    [facility]; secondary to metromenorrhagia  . Meniscus repair  2003    Arthroscopic, left,  [facility]  . Heel spur surgery      [facility], [facility]  . Cervical spine surgery  2004    [facility]  . Port-a-cath removal  11/05/2010    Procedure: REMOVAL PORT-A-CATH;  Surgeon: Jamesetta So;  Location: [facility]S;  Service: General;  Laterality: Left;  started at 0846  . Portacath placement  11/05/2010    Procedure: INSERTION PORT-A-CATH;  Surgeon: Jamesetta So;  Location: [facility];  Service: General;  Laterality: Right;  Right subclavian, device left accessed   . Colonoscopy   07/02/2007    RMR: Normal rectum status post prior sigmoid resection with normal anastomosis identified at 35 cm.  Normal residual colon.Marland Kitchen Next TCS 06/2012  . Esophagogastroduodenoscopy    08/22/2007    ERD:EYCXK hiatal hernia/A circular, purplish, 1-cm, distal esophageal submucosal lesion of uncertain significance, otherwise normal esophagus. EUS -esophageal bleb (aberrant blood vessel). retained gastric contents/food  . Appendectomy  1953  . Esophagogastroduodenoscopy  01/09/12    Rourk: small HH, otherwise normal EGD  . Colonoscopy  01/09/12    Rourk: surgical anastamosis @ 25cm.  2 diminutive polyps (tubular adenomas) removed. next TCS 12/2014.    Denies any headaches, dizziness, double vision, fevers, chills, night sweats, nausea, vomiting, diarrhea, constipation, chest pain, heart palpitations, shortness of breath, blood in stool, black tarry stool, urinary pain, urinary burning, urinary frequency, hematuria.   PHYSICAL EXAMINATION  ECOG PERFORMANCE STATUS: 4 -  Bedbound  Filed Vitals:   05/29/13 1159  BP: 116/89  Pulse: 99  Resp: 20    GENERAL: cooperative, ill looking and weak, failing to thrive, difficulty keeping her head elevated for discussion SKIN: skin color, texture, turgor are normal, no rashes or significant lesions HEAD: Normocephalic, No masses, lesions, tenderness or abnormalities EYES: normal, PERRLA, EOMI, Conjunctiva are pink and non-injected EARS: External ears normal OROPHARYNX:dry mucous membranes  NECK: supple, trachea midline, left clavicular mass measuring 2 cm LYMPH:  Left supraclavicular shoddy nodes BREAST:not examined LUNGS: not examined HEART: not examined CHEST: Large sternum mass ABDOMEN:obese BACK: Back symmetric, no curvature. EXTREMITIES:less then 2 second capillary refill, no joint deformities, effusion, or inflammation, no edema, no cyanosis, dry skin  NEURO: alert & oriented x 3 with fluent speech, no focal motor/sensory deficits, in a wheelchair    LABORATORY DATA: CBC    Component Value Date/Time   WBC 6.3 04/26/2013 0946   RBC 3.52* 04/26/2013 0946   RBC 2.97* 08/19/2011  0715   HGB 10.4* 04/26/2013 0946   HCT 33.4* 04/26/2013 0946   PLT 217 04/26/2013 0946   MCV 94.9 04/26/2013 0946   MCH 29.5 04/26/2013 0946   MCHC 31.1 04/26/2013 0946   RDW 17.1* 04/26/2013 0946   LYMPHSABS 0.8 04/26/2013 0946   MONOABS 0.9 04/26/2013 0946   EOSABS 0.0 04/26/2013 0946   BASOSABS 0.0 04/26/2013 0946      Chemistry      Component Value Date/Time   NA 138 05/01/2013 0514   K 4.3 05/01/2013 0514   CL 100 05/01/2013 0514   CO2 27 05/01/2013 0514   BUN 32* 05/01/2013 0514   CREATININE 1.39* 05/01/2013 0514   CREATININE 104.89* 03/04/2010 0936      Component Value Date/Time   CALCIUM 9.7 05/01/2013 0514   CALCIUM 10.4 08/19/2011 1105   ALKPHOS 38* 04/29/2013 0511   AST 12 04/29/2013 0511   ALT 13 04/29/2013 0511   BILITOT 0.5 04/29/2013 0511        ASSESSMENT:  1. 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 She developed  overt myeloma 07/16/2009 with IgG lambda M spike and she also had at one time an IgG kappa spike. As of April she has no detectable M spike. She is now on pomalidomide 21 days on 7 days off with excellent control and tolerability. She also uses 20 mg of dexamethasone on Wednesdays of every week. We had reduced her dose of dexamethasone to this level because of issues with her sugars. Now with clinical progression of disease with left clavicular mass and sternal mass  2. History of stroke with some residual weakness in left arm and left leg. On ASA, Crestor, and antihypertensives.  3. Failure to thrive 4. Decreased appetite 5. Decreased fluid intake 6. Weight loss   Patient Active Problem List   Diagnosis Date Noted  . COPD exacerbation 04/26/2013  . Muscle weakness (generalized) 10/24/2012  . Difficulty in walking(719.7) 10/24/2012  . Chronic constipation 08/28/2012  . Tubular adenoma of colon 05/08/2012  . Cerebrovascular disease 03/12/2012  . Hypertension   . Arteriosclerotic cardiovascular disease (ASCVD)   . Borderline diabetes   . GERD (gastroesophageal reflux disease)   . Hyperlipidemia   . Obesity, Class II, BMI 35-39.9, with comorbidity 02/10/2012  . Anemia, normocytic normochromic 12/15/2011  . MULTIPLE  MYELOMA 03/28/2008    PLAN:  1. I personally reviewed and went over radiographic studies with the patient.  The results are noted within this dictation.   2. Stop Pomalyst and Dexamethasone 3. Recommend Hospice, but patient/family decline 4. Recommend fluid intake as tolerated 5. Comfort care 6. Patient refused follow-up appointment at the [facility].     THERAPY PLAN:  The patient has progressed with regards to her multiple myeloma.  She was on a salvage regimen of Pomalyst + Dex.  Now that she has failed and is failing to thrive it is reasonable to transition to comfort care.  I have stopped Pomalyst and Dexamethasone.  I have encouraged Hospice, but the  patient/family has declined as mentioned above.  She declines a future appointment at the [facility] as well.  We will be available for palliative management and encourage reconsideration of Hospice services.  All questions were answered. The patient knows to call the clinic with any problems, questions or concerns. We can certainly see the patient much sooner if necessary.  Patient and plan discussed with Dr. Farrel Gobble and he is in agreement  with the aforementioned.   KEFALAS,THOMAS

## 2013-05-29 ENCOUNTER — Other Ambulatory Visit (HOSPITAL_COMMUNITY): Payer: PRIVATE HEALTH INSURANCE

## 2013-05-29 ENCOUNTER — Encounter (HOSPITAL_COMMUNITY): Payer: PRIVATE HEALTH INSURANCE | Attending: Oncology | Admitting: Oncology

## 2013-05-29 VITALS — BP 116/89 | HR 99 | Resp 20

## 2013-05-29 DIAGNOSIS — C9000 Multiple myeloma not having achieved remission: Secondary | ICD-10-CM | POA: Insufficient documentation

## 2013-05-29 DIAGNOSIS — C9002 Multiple myeloma in relapse: Secondary | ICD-10-CM

## 2013-05-29 DIAGNOSIS — R627 Adult failure to thrive: Secondary | ICD-10-CM

## 2013-05-29 MED ORDER — HEPARIN SOD (PORK) LOCK FLUSH 100 UNIT/ML IV SOLN
INTRAVENOUS | Status: AC
  Filled 2013-05-29: qty 5

## 2013-05-29 NOTE — Patient Instructions (Signed)
[facility] Discharge Instructions  RECOMMENDATIONS MADE BY THE CONSULTANT AND ANY TEST RESULTS WILL BE SENT TO YOUR REFERRING PHYSICIAN.  EXAM FINDINGS BY THE PHYSICIAN TODAY AND SIGNS OR SYMPTOMS TO REPORT TO CLINIC OR PRIMARY PHYSICIAN:   You can stop all of your chemo medications.   Please reconsider Hospice and if you decide to go forward with Hospice we will arrange for them to come out.   No future appointments needed with [place], no future lab appointments needed with [place], and no future port flushes needed.    Thank you for choosing [facility] to provide your oncology and hematology care.  To afford each patient quality time with our providers, please arrive at least 15 minutes before your scheduled appointment time.  With your help, our goal is to use those 15 minutes to complete the necessary work-up to ensure our physicians have the information they need to help with your evaluation and healthcare recommendations.    Effective January 1st, 2014, we ask that you re-schedule your appointment with our physicians should you arrive 10 or more minutes late for your appointment.  We strive to give you quality time with our providers, and arriving late affects you and other patients whose appointments are after yours.    Again, thank you for choosing [facility].  Our hope is that these requests will decrease the amount of time that you wait before being seen by our physicians.       _____________________________________________________________  Should you have questions after your visit to [facility], please contact our office at [phone] between the hours of 8:30 a.m. and 5:00 p.m.  Voicemails left after 4:30 p.m. will not be returned until the following business day.  For prescription refill requests, have your pharmacy contact our office with your prescription refill request.

## 2013-06-05 ENCOUNTER — Emergency Department (HOSPITAL_COMMUNITY): Payer: PRIVATE HEALTH INSURANCE

## 2013-06-05 ENCOUNTER — Encounter (HOSPITAL_COMMUNITY): Payer: Self-pay | Admitting: Emergency Medicine

## 2013-06-05 ENCOUNTER — Other Ambulatory Visit (HOSPITAL_COMMUNITY): Payer: PRIVATE HEALTH INSURANCE

## 2013-06-05 ENCOUNTER — Inpatient Hospital Stay (HOSPITAL_COMMUNITY)
Admission: EM | Admit: 2013-06-05 | Discharge: 2013-06-23 | DRG: 189 | Disposition: E | Payer: PRIVATE HEALTH INSURANCE | Attending: Internal Medicine | Admitting: Internal Medicine

## 2013-06-05 DIAGNOSIS — I69998 Other sequelae following unspecified cerebrovascular disease: Secondary | ICD-10-CM

## 2013-06-05 DIAGNOSIS — Z8249 Family history of ischemic heart disease and other diseases of the circulatory system: Secondary | ICD-10-CM

## 2013-06-05 DIAGNOSIS — Z800 Family history of malignant neoplasm of digestive organs: Secondary | ICD-10-CM | POA: Diagnosis not present

## 2013-06-05 DIAGNOSIS — I2510 Atherosclerotic heart disease of native coronary artery without angina pectoris: Secondary | ICD-10-CM | POA: Diagnosis present

## 2013-06-05 DIAGNOSIS — I129 Hypertensive chronic kidney disease with stage 1 through stage 4 chronic kidney disease, or unspecified chronic kidney disease: Secondary | ICD-10-CM | POA: Diagnosis present

## 2013-06-05 DIAGNOSIS — E872 Acidosis, unspecified: Secondary | ICD-10-CM | POA: Diagnosis present

## 2013-06-05 DIAGNOSIS — K219 Gastro-esophageal reflux disease without esophagitis: Secondary | ICD-10-CM | POA: Diagnosis present

## 2013-06-05 DIAGNOSIS — R5381 Other malaise: Secondary | ICD-10-CM | POA: Diagnosis present

## 2013-06-05 DIAGNOSIS — N183 Chronic kidney disease, stage 3 unspecified: Secondary | ICD-10-CM | POA: Diagnosis present

## 2013-06-05 DIAGNOSIS — N179 Acute kidney failure, unspecified: Secondary | ICD-10-CM | POA: Diagnosis present

## 2013-06-05 DIAGNOSIS — I10 Essential (primary) hypertension: Secondary | ICD-10-CM | POA: Diagnosis present

## 2013-06-05 DIAGNOSIS — N393 Stress incontinence (female) (male): Secondary | ICD-10-CM | POA: Diagnosis present

## 2013-06-05 DIAGNOSIS — I2699 Other pulmonary embolism without acute cor pulmonale: Secondary | ICD-10-CM | POA: Diagnosis present

## 2013-06-05 DIAGNOSIS — I469 Cardiac arrest, cause unspecified: Secondary | ICD-10-CM | POA: Diagnosis not present

## 2013-06-05 DIAGNOSIS — K3184 Gastroparesis: Secondary | ICD-10-CM | POA: Diagnosis present

## 2013-06-05 DIAGNOSIS — Z6835 Body mass index (BMI) 35.0-35.9, adult: Secondary | ICD-10-CM | POA: Diagnosis not present

## 2013-06-05 DIAGNOSIS — J40 Bronchitis, not specified as acute or chronic: Secondary | ICD-10-CM | POA: Diagnosis present

## 2013-06-05 DIAGNOSIS — Z87891 Personal history of nicotine dependence: Secondary | ICD-10-CM

## 2013-06-05 DIAGNOSIS — Z9861 Coronary angioplasty status: Secondary | ICD-10-CM

## 2013-06-05 DIAGNOSIS — E785 Hyperlipidemia, unspecified: Secondary | ICD-10-CM | POA: Diagnosis present

## 2013-06-05 DIAGNOSIS — Z9049 Acquired absence of other specified parts of digestive tract: Secondary | ICD-10-CM | POA: Diagnosis not present

## 2013-06-05 DIAGNOSIS — E860 Dehydration: Secondary | ICD-10-CM

## 2013-06-05 DIAGNOSIS — R0602 Shortness of breath: Secondary | ICD-10-CM | POA: Diagnosis present

## 2013-06-05 DIAGNOSIS — R7303 Prediabetes: Secondary | ICD-10-CM

## 2013-06-05 DIAGNOSIS — C9000 Multiple myeloma not having achieved remission: Secondary | ICD-10-CM | POA: Diagnosis present

## 2013-06-05 DIAGNOSIS — D649 Anemia, unspecified: Secondary | ICD-10-CM

## 2013-06-05 DIAGNOSIS — Z7982 Long term (current) use of aspirin: Secondary | ICD-10-CM

## 2013-06-05 DIAGNOSIS — J9600 Acute respiratory failure, unspecified whether with hypoxia or hypercapnia: Secondary | ICD-10-CM | POA: Diagnosis present

## 2013-06-05 DIAGNOSIS — N390 Urinary tract infection, site not specified: Secondary | ICD-10-CM | POA: Diagnosis present

## 2013-06-05 DIAGNOSIS — R7309 Other abnormal glucose: Secondary | ICD-10-CM

## 2013-06-05 DIAGNOSIS — Z85038 Personal history of other malignant neoplasm of large intestine: Secondary | ICD-10-CM

## 2013-06-05 DIAGNOSIS — M1990 Unspecified osteoarthritis, unspecified site: Secondary | ICD-10-CM | POA: Diagnosis present

## 2013-06-05 DIAGNOSIS — E669 Obesity, unspecified: Secondary | ICD-10-CM | POA: Diagnosis present

## 2013-06-05 DIAGNOSIS — I679 Cerebrovascular disease, unspecified: Secondary | ICD-10-CM | POA: Diagnosis present

## 2013-06-05 DIAGNOSIS — IMO0001 Reserved for inherently not codable concepts without codable children: Secondary | ICD-10-CM

## 2013-06-05 DIAGNOSIS — R0682 Tachypnea, not elsewhere classified: Secondary | ICD-10-CM

## 2013-06-05 LAB — CBC WITH DIFFERENTIAL/PLATELET
BASOS PCT: 0 % (ref 0–1)
Basophils Absolute: 0 10*3/uL (ref 0.0–0.1)
Eosinophils Absolute: 0 10*3/uL (ref 0.0–0.7)
Eosinophils Relative: 0 % (ref 0–5)
HEMATOCRIT: 36.1 % (ref 36.0–46.0)
HEMOGLOBIN: 11.2 g/dL — AB (ref 12.0–15.0)
LYMPHS ABS: 0.5 10*3/uL — AB (ref 0.7–4.0)
Lymphocytes Relative: 3 % — ABNORMAL LOW (ref 12–46)
MCH: 30.6 pg (ref 26.0–34.0)
MCHC: 31 g/dL (ref 30.0–36.0)
MCV: 98.6 fL (ref 78.0–100.0)
MONO ABS: 1 10*3/uL (ref 0.1–1.0)
MONOS PCT: 6 % (ref 3–12)
NEUTROS PCT: 91 % — AB (ref 43–77)
Neutro Abs: 15.3 10*3/uL — ABNORMAL HIGH (ref 1.7–7.7)
Platelets: 206 10*3/uL (ref 150–400)
RBC: 3.66 MIL/uL — AB (ref 3.87–5.11)
RDW: 21 % — ABNORMAL HIGH (ref 11.5–15.5)
WBC: 16.8 10*3/uL — ABNORMAL HIGH (ref 4.0–10.5)

## 2013-06-05 LAB — BASIC METABOLIC PANEL
BUN: 34 mg/dL — AB (ref 6–23)
CO2: 9 mEq/L — CL (ref 19–32)
CREATININE: 2.38 mg/dL — AB (ref 0.50–1.10)
Calcium: 10.2 mg/dL (ref 8.4–10.5)
Chloride: 103 mEq/L (ref 96–112)
GFR calc non Af Amer: 19 mL/min — ABNORMAL LOW (ref >90)
GFR, EST AFRICAN AMERICAN: 22 mL/min — AB (ref >90)
GLUCOSE: 228 mg/dL — AB (ref 70–99)
POTASSIUM: 4.5 meq/L (ref 3.7–5.3)
Sodium: 144 mEq/L (ref 137–147)

## 2013-06-05 LAB — BLOOD GAS, ARTERIAL
Acid-base deficit: 21.5 mmol/L — ABNORMAL HIGH (ref 0.0–2.0)
BICARBONATE: 5 meq/L — AB (ref 20.0–24.0)
Drawn by: 234301
O2 Content: 2.5 L/min
O2 Saturation: 96.7 %
PATIENT TEMPERATURE: 37
PH ART: 7.254 — AB (ref 7.350–7.450)
TCO2: 4.4 mmol/L (ref 0–100)
pCO2 arterial: 11.8 mmHg — CL (ref 35.0–45.0)
pO2, Arterial: 117 mmHg — ABNORMAL HIGH (ref 80.0–100.0)

## 2013-06-05 LAB — URINALYSIS, ROUTINE W REFLEX MICROSCOPIC
GLUCOSE, UA: 250 mg/dL — AB
Leukocytes, UA: NEGATIVE
Nitrite: NEGATIVE
Protein, ur: 100 mg/dL — AB
Specific Gravity, Urine: >1.03 — ABNORMAL HIGH (ref 1.005–1.030)
Urobilinogen, UA: 0.2 mg/dL (ref 0.0–1.0)
pH: 5 (ref 5.0–8.0)

## 2013-06-05 LAB — URINE MICROSCOPIC-ADD ON

## 2013-06-05 MED ORDER — ENOXAPARIN SODIUM 100 MG/ML ~~LOC~~ SOLN: 100.0000 mg | SUBCUTANEOUS | Status: DC

## 2013-06-05 MED ORDER — ACETAMINOPHEN 650 MG RE SUPP: 650.0000 mg | Freq: Four times a day (QID) | RECTAL | Status: DC | PRN

## 2013-06-05 MED ORDER — VITAMIN D 1000 UNITS PO TABS: 1000.0000 [IU] | ORAL_TABLET | Freq: Every day | ORAL | Status: DC

## 2013-06-05 MED ORDER — SODIUM CHLORIDE 0.9 % IV SOLN
INTRAVENOUS | Status: DC
  Administered 2013-06-05: 13:00:00 via INTRAVENOUS

## 2013-06-05 MED ORDER — MORPHINE SULFATE 2 MG/ML IJ SOLN: 2.0000 mg | INTRAMUSCULAR | Status: DC | PRN

## 2013-06-05 MED ORDER — POTASSIUM CHLORIDE CRYS ER 10 MEQ PO TBCR: 10.0000 meq | EXTENDED_RELEASE_TABLET | Freq: Two times a day (BID) | ORAL | Status: DC

## 2013-06-05 MED ORDER — ATORVASTATIN CALCIUM 40 MG PO TABS: 40.0000 mg | ORAL_TABLET | Freq: Every day | ORAL | Status: DC

## 2013-06-05 MED ORDER — SODIUM CHLORIDE 0.9 % IV BOLUS (SEPSIS)
1000.0000 mL | Freq: Once | INTRAVENOUS | Status: AC
  Administered 2013-06-05: 1000 mL via INTRAVENOUS

## 2013-06-05 MED ORDER — DEXTROSE 5 % IV SOLN
500.0000 mg | INTRAVENOUS | Status: DC
  Filled 2013-06-05: qty 500

## 2013-06-05 MED ORDER — DEXTROSE 5 % IV SOLN
1.0000 g | INTRAVENOUS | Status: DC
  Filled 2013-06-05 (×2): qty 10

## 2013-06-05 MED ORDER — PANTOPRAZOLE SODIUM 40 MG PO TBEC: 40.0000 mg | DELAYED_RELEASE_TABLET | Freq: Every day | ORAL | Status: DC

## 2013-06-05 MED ORDER — ASPIRIN EC 81 MG PO TBEC: 81.0000 mg | DELAYED_RELEASE_TABLET | Freq: Every day | ORAL | Status: DC

## 2013-06-05 MED ORDER — IPRATROPIUM-ALBUTEROL 0.5-2.5 (3) MG/3ML IN SOLN: 3.0000 mL | RESPIRATORY_TRACT | Status: DC

## 2013-06-05 MED ORDER — AMLODIPINE BESYLATE 5 MG PO TABS: 5.0000 mg | ORAL_TABLET | Freq: Every day | ORAL | Status: DC

## 2013-06-05 MED ORDER — DEXTROSE 5 % IV SOLN
1.0000 g | INTRAVENOUS | Status: DC
  Filled 2013-06-05: qty 10

## 2013-06-05 MED ORDER — CLONIDINE HCL 0.2 MG PO TABS
0.2000 mg | ORAL_TABLET | Freq: Two times a day (BID) | ORAL | Status: DC
Start: 2013-06-05 — End: 2013-06-05

## 2013-06-05 MED ORDER — OXYBUTYNIN CHLORIDE 5 MG PO TABS: 10.0000 mg | ORAL_TABLET | Freq: Every day | ORAL | Status: DC

## 2013-06-05 MED ORDER — ACETAMINOPHEN 325 MG PO TABS: 650.0000 mg | ORAL_TABLET | Freq: Four times a day (QID) | ORAL | Status: DC | PRN

## 2013-06-05 MED ORDER — NEBIVOLOL HCL 2.5 MG PO TABS
5.0000 mg | ORAL_TABLET | Freq: Every day | ORAL | Status: DC
  Filled 2013-06-05 (×3): qty 2

## 2013-06-05 MED ORDER — TEMAZEPAM 15 MG PO CAPS: 15.0000 mg | ORAL_CAPSULE | Freq: Every evening | ORAL | Status: DC | PRN

## 2013-06-05 MED ORDER — HYDROCODONE-ACETAMINOPHEN 5-325 MG PO TABS: 1.0000 | ORAL_TABLET | ORAL | Status: DC | PRN

## 2013-06-05 MED ORDER — SODIUM CHLORIDE 0.9 % IV SOLN
INTRAVENOUS | Status: DC
  Administered 2013-06-05: 11:00:00 via INTRAVENOUS

## 2013-06-05 MED ORDER — DOCUSATE SODIUM 100 MG PO CAPS: 100.0000 mg | ORAL_CAPSULE | Freq: Two times a day (BID) | ORAL | Status: DC

## 2013-06-05 MED ORDER — DEXTROSE 5 % IV SOLN
1.0000 g | Freq: Once | INTRAVENOUS | Status: AC
  Administered 2013-06-05: 1 g via INTRAVENOUS
  Filled 2013-06-05: qty 10

## 2013-06-06 LAB — URINE CULTURE
COLONY COUNT: NO GROWTH
Culture: NO GROWTH

## 2013-06-06 MED FILL — Medication: Qty: 1 | Status: AC

## 2013-06-13 ENCOUNTER — Ambulatory Visit (HOSPITAL_COMMUNITY): Payer: PRIVATE HEALTH INSURANCE

## 2013-06-23 NOTE — ED Notes (Signed)
Pt is a cancer pt, on comfort measures at home, pt is not a DNR at this time, pt's family states the pt has been breathing very fast all night, given 4-6 months to end of life by Oncologist approx 4 weeks ago. Pt's family states the pt's mental state becomes altered at times.

## 2013-06-23 NOTE — Progress Notes (Signed)
Received MD consult to assess nutritional status. Noted pt expired.  [name] A. Jimmye Norman, RD, LDN Pager: [phone]

## 2013-06-23 NOTE — Progress Notes (Signed)
Inpatient Diabetes Program Recommendations  AACE/ADA: New Consensus Statement on Inpatient Glycemic Control (2013)  Target Ranges:  Prepandial:   less than 140 mg/dL      Peak postprandial:   less than 180 mg/dL (1-2 hours)      Critically ill patients:  140 - 180 mg/dL   Results for EVELIN, CAKE (MRN [id number]) as of Jun 08, 2013 14:00  Ref. Range 06-08-13 09:45  Glucose Latest Range: 70-99 mg/dL 228 (H)   Diabetes history: Recently diagnosed in Jan 2015 (based on A1C of 9.6% on 04/26/2013) Outpatient Diabetes medications: None noted on home medication list. However when pt was discharged from hospital on 05/02/13 she was prescribed Metformin 500 mg BID. Current orders for Inpatient glycemic control: None  Inpatient Diabetes Program Recommendations Correction (SSI): Recently new dx of diabetes based on A1C of 9.6% on 04/26/13. While inpatient, please order CBGs with Novolog correction scale Q4H. Diet: Please add Carb Modfiied to current Dys 3 diet order.  Thanks, Barnie Alderman, RN, MSN, CCRN Diabetes Coordinator Inpatient Diabetes Program [phone] (Team Pager) [phone] ([facility] office) [phone] [facility] office)

## 2013-06-23 NOTE — Progress Notes (Signed)
PT Cancellation Note  Patient Details Name: [name] MRN: [id number] DOB: Aug 24, 1940   Cancelled Treatment:     Pt daughter at bedside.  Pt is not responding to daughter or therapist.  Pt states she may need to consider hospice.  [employer] called as this note is being written will hold until pt is stable.   RUSSELL,CINDY 25-Jun-2013, 1:20 PM

## 2013-06-23 NOTE — Progress Notes (Signed)
UR chart review completed.  

## 2013-06-23 NOTE — H&P (Signed)
[facility] History and Physical  BETHANN QUALLEY HWE:[id number] DOB: [birth year] DOA: 06/18/13  Referring physician:  PCP: Maricela Curet, MD  Specialists:   Chief Complaint: Hyperventilating  HPI: [name] is a 73 y.o. female  With a history of multiple myeloma, hypertension, history of stroke that presents emergency department for trouble breathing. Daughters at bedside provided most of her history. Patient was oncology several days ago which at that time decided to stop receiving treatment for multiple myeloma. Patient's family members state that over the last several weeks her appetite has decreased. He also states she's been unable to get out of bed without assistance. Patient's daughter denies any current members have been sick. Denies any fever or chills at home. They do state her hyperventilating and her trouble breathing has been ongoing over a several days.  Review of Systems:  Constitutional: Complains of decreased appetite. HEENT: Denies photophobia, eye pain, redness, hearing loss, ear pain, congestion, sore throat, rhinorrhea, sneezing, mouth sores, trouble swallowing, neck pain, neck stiffness and tinnitus.   Respiratory: Complains of shortness of breath and trouble breathing. Denies cough. Cardiovascular: Denies chest pain, palpitations and leg swelling.  Gastrointestinal: Denies nausea, vomiting, abdominal pain, diarrhea, constipation, blood in stool and abdominal distention.  Genitourinary: Denies dysuria, urgency, frequency, hematuria, flank pain and difficulty urinating.  Musculoskeletal: Has been bed bound, unable to get out of bed without assistance. Skin: Denies pallor, rash and wound.  Neurological: Denies dizziness, seizures, syncope, weakness, light-headedness, numbness and headaches.  Hematological: Denies adenopathy. Easy bruising, personal or family bleeding history  Psychiatric/Behavioral: Denies suicidal ideation, mood changes, confusion,  nervousness, sleep disturbance and agitation  Past Medical History  Diagnosis Date  . Hypertension   . Arteriosclerotic cardiovascular disease (ASCVD)     Former patient of [facility] - PTCA apical LAD 2005  . Borderline diabetes     + proteinuria  . GERD (gastroesophageal reflux disease)     + constipation, gastroparesis  . Hyperlipidemia   . Female stress incontinence     H/o urinary tract infection  . Vertigo   . Insomnia   . Colon cancer     1995  . Multiple myeloma   . Osteoarthritis     cervical spine; status post surgery  . CVA (cerebral infarction) 01/2012  . Tubular adenoma of colon 01/09/12  . Gastroparesis 2009    abnl GES   Past Surgical History  Procedure Laterality Date  . Partial colectomy  1995     [facility], colon cancer  . Abdominal hysterectomy  1970s    [facility]; secondary to metromenorrhagia  . Meniscus repair  2003    Arthroscopic, left,  [facility]  . Heel spur surgery      [facility], [facility]  . Cervical spine surgery  2004    [facility]  . Port-a-cath removal  11/05/2010    Procedure: REMOVAL PORT-A-CATH;  Surgeon: Jamesetta So;  Location: [facility];  Service: General;  Laterality: Left;  started at 0846  . Portacath placement  11/05/2010    Procedure: INSERTION PORT-A-CATH;  Surgeon: Jamesetta So;  Location: [facility];  Service: General;  Laterality: Right;  Right subclavian, device left accessed   . Colonoscopy   07/02/2007    RMR: Normal rectum status post prior sigmoid resection with normal anastomosis identified at 35 cm.  Normal residual colon.Marland Kitchen Next TCS 06/2012  . Esophagogastroduodenoscopy    08/22/2007    ELF:YBOFB hiatal hernia/A circular, purplish, 1-cm, distal esophageal submucosal lesion of uncertain significance, otherwise normal esophagus.  EUS -esophageal bleb (aberrant blood vessel). retained gastric contents/food  . Appendectomy  1953  . Esophagogastroduodenoscopy  01/09/12    Rourk: small HH, otherwise normal EGD  . Colonoscopy  01/09/12    Rourk: surgical anastamosis  @ 25cm.  2 diminutive polyps (tubular adenomas) removed. next TCS 12/2014.   Social History:  reports that she quit smoking about 22 years ago. Her smoking use included Cigarettes. She has a 3.75 pack-year smoking history. She has never used smokeless tobacco. She reports that she does not drink alcohol or use illicit drugs.   Allergies  Allergen Reactions  . Gabapentin Other (See Comments)    Difficulty with speech  . Simvastatin Other (See Comments)    Leg Cramps     Family History  Problem Relation Age of Onset  . Anesthesia problems Neg Hx   . Hypotension Neg Hx   . Malignant hyperthermia Neg Hx   . Pseudochol deficiency Neg Hx   . Lung disease Mother   . Heart disease Father   . Heart disease Brother   . Hypertension Brother   . Arthritis Daughter   . Seizures Daughter   . Multiple sclerosis Son   . Colon cancer Brother 13    Prior to Admission medications   Medication Sig Start Date End Date Taking? Authorizing Provider  amLODipine (NORVASC) 5 MG tablet Take 1 tablet (5 mg total) by mouth daily. 02/10/12  Yes Donzetta Starch, NP  aspirin EC 81 MG tablet Take 81 mg by mouth daily.   Yes Historical Provider, MD  cholecalciferol (VITAMIN D) 1000 UNITS tablet Take 1,000 Units by mouth daily.   Yes Historical Provider, MD  cloNIDine (CATAPRES) 0.2 MG tablet Take 1 tablet (0.2 mg total) by mouth 2 (two) times daily. 05/01/13  Yes Richard Marcia Brash, MD  CRESTOR 20 MG tablet Take 20 mg by mouth daily.  05/03/12  Yes Historical Provider, MD  docusate sodium (COLACE) 100 MG capsule Take 1 capsule (100 mg total) by mouth 2 (two) times daily. 03/13/13  Yes Thomas S Kefalas, PA-C  ipratropium-albuterol (DUONEB) 0.5-2.5 (3) MG/3ML SOLN Take 3 mLs by nebulization every 4 (four) hours. 05/01/13  Yes Maricela Curet, MD  meloxicam (MOBIC) 7.5 MG tablet Take 7.5 mg by mouth daily.  11/19/12  Yes Historical Provider, MD  naproxen sodium (ANAPROX) 220 MG tablet Take 220 mg by mouth 2 (two) times  daily with a meal.   Yes Historical Provider, MD  nebivolol (BYSTOLIC) 5 MG tablet Take 5 mg by mouth daily.  12/07/11  Yes Manon Hilding Kefalas, PA-C  omeprazole (PRILOSEC) 20 MG capsule Take 20 mg by mouth 2 (two) times daily.    Yes Historical Provider, MD  oxybutynin (DITROPAN) 5 MG tablet Take 10 mg by mouth daily. 05/09/12  Yes Manon Hilding Kefalas, PA-C  potassium chloride (K-DUR) 10 MEQ tablet Take 10 mEq by mouth 2 (two) times daily.   Yes Historical Provider, MD  temazepam (RESTORIL) 15 MG capsule Take 1 capsule (15 mg total) by mouth at bedtime as needed for sleep. 04/09/13  Yes Manon Hilding Kefalas, PA-C  valsartan (DIOVAN) 160 MG tablet Take 1 tablet (160 mg total) by mouth daily. 02/06/13  Yes Farrel Gobble, MD   Physical Exam: Filed Vitals:   06-30-13 1033  BP: 113/92  Temp:   Resp: 30     General: Well developed, well nourished, distressed, appears stated age  HEENT: NCAT, PERRLA, EOMI, Anicteic Sclera, mucous membranes dry  Neck: Supple, no  JVD  Cardiovascular: S1 S2 auscultated, tachycardic  Chest: 2 masses on upper chest wall, tender to touch.  Respiratory: Scattered wheezing with decreased air movement noted. Currently tachypneic.  Abdomen: Soft, obese, nontender, nondistended, + bowel sounds  Extremities: warm dry without cyanosis clubbing or edema  Neuro: AAOx3, cranial nerves grossly intact. Strength 4/5 in RUE/RLE.  Strength 3/5 in LLE/LUE.  Skin: Without rashes exudates or nodules, ecchymosis on left great toe.  Psych: Normal affect and demeanor with intact judgement and insight  Labs on Admission:  Basic Metabolic Panel:  Recent Labs Lab 07/04/2013 0945  NA 144  K 4.5  CL 103  CO2 9*  GLUCOSE 228*  BUN 34*  CREATININE 2.38*  CALCIUM 10.2   Liver Function Tests: No results found for this basename: AST, ALT, ALKPHOS, BILITOT, PROT, ALBUMIN,  in the last 168 hours No results found for this basename: LIPASE, AMYLASE,  in the last 168 hours No results  found for this basename: AMMONIA,  in the last 168 hours CBC:  Recent Labs Lab 07-04-2013 0945  WBC 16.8*  NEUTROABS 15.3*  HGB 11.2*  HCT 36.1  MCV 98.6  PLT 206   Cardiac Enzymes: No results found for this basename: CKTOTAL, CKMB, CKMBINDEX, TROPONINI,  in the last 168 hours  BNP (last 3 results) No results found for this basename: PROBNP,  in the last 8760 hours CBG: No results found for this basename: GLUCAP,  in the last 168 hours  Radiological Exams on Admission: Dg Chest Port 1 View  07/04/2013   CLINICAL DATA:  Shortness of breath.  EXAM: PORTABLE CHEST - 1 VIEW  COMPARISON:  04/26/2013  FINDINGS: Power port in place, unchanged. Slight chronic cardiomegaly with tortuosity of the thoracic aorta. Pulmonary vascularity is normal. Lungs are clear except for peribronchial thickening. No acute osseous abnormality.  IMPRESSION: Peribronchial thickening suggestive of bronchitis.   Electronically Signed   By: Rozetta Nunnery M.D.   On: 07/04/13 08:39    Assessment/Plan  Acute respiratory failure with metabolic acidosis Patient will be admitted to step down unit. Have asked the emergency department to place patient on BiPAP. Her respiratory failure may be secondary to possible bronchitis versus possible PE. Will cover patient for community-acquired pneumonia with azithromycin and ceftriaxone IV. Will also place her on BiPAP to maintain her oxygen saturations above 92%. Will also obtain an ABG. At this time cannot obtain a CT of the chest due to her acute kidney injury. However will place her on full dose Lovenox with pharmacy to follow. Patient is high risk for PE due to her immobilization and history of multiple myeloma.  Acute kidney injury with history of CKD, stage III Will hold valsartan. Place her on IV fluids. Will continue to monitor her creatinine and avoid nephrotoxic agents.  Multiple myeloma Patient was seen by her oncologist on 05/30/2013. According to the family there is  no further intervention planned, nothing else can be done. We will ask case management as well as social work to speak to the family about their options as well as goals of care and possible hospice. Unfortunately palliative is not available.  Hypertension Will continue patient's amlodipine, nebivolol, as well as clonidine. Will hold her valsartan.  Hyperlipidemia Will continue her statin therapy.  History of stroke with residual left-sided weakness Currently stable. Will continue aspirin therapy.  GERD Will place patient on Protonix  Borderline diabetes Will obtain CBG monitoring place her on insulin sliding scale. Patient does appear to be diet controlled.  Stress incontinence Continue oxybutynin.  Generalized weakness Likely secondary to her multiple myeloma as well as other medical comorbidities. Will ask for nutrition to a evaluate the patient. Will also have physical therapy as well as occupational therapy evaluate and treat patient.   DVT prophylaxis: Full dose lovenox  Code Status: Full  Condition: Guarded   Family Communication: Daughters at bedside. Admission, patients condition and plan of care including tests being ordered have been discussed with the patient and daughters who indicate understanding and agree with the plan and Code Status.  Disposition Plan: Admitted   Time spent: 60 minutes  Rigdon Macomber D.O. [facility] Pager [phone]  If 7PM-7AM, please contact night-coverage [email or web address] Password [facility] 06/15/2013, 11:29 AM

## 2013-06-23 NOTE — Progress Notes (Signed)
ANTIBIOTIC CONSULT NOTE - INITIAL  Pharmacy Consult for Rocephin Indication: UTI  Allergies  Allergen Reactions  . Gabapentin Other (See Comments)    Difficulty with speech  . Simvastatin Other (See Comments)    Leg Cramps    Patient Measurements: Height: 5' 5"  (165.1 cm) Weight: 217 lb 9.5 oz (98.7 kg) IBW/kg (Calculated) : 57  Vital Signs: Temp: 97.8 F (36.6 C) (02/11 0805) Temp src: Oral (02/11 0805) BP: 113/92 mmHg (02/11 1033) Pulse Rate: 116 (02/11 1256) Intake/Output from previous day:   Intake/Output from this shift:    Labs:  Recent Labs  2013/06/19 0945  WBC 16.8*  HGB 11.2*  PLT 206  CREATININE 2.38*   Estimated Creatinine Clearance: 25.2 ml/min (by C-G formula based on Cr of 2.38). No results found for this basename: VANCOTROUGH, VANCOPEAK, VANCORANDOM, GENTTROUGH, GENTPEAK, GENTRANDOM, TOBRATROUGH, TOBRAPEAK, TOBRARND, AMIKACINPEAK, AMIKACINTROU, AMIKACIN,  in the last 72 hours   Microbiology: No results found for this or any previous visit (from the past 720 hour(s)).  Medical History: Past Medical History  Diagnosis Date  . Hypertension   . Arteriosclerotic cardiovascular disease (ASCVD)     Former patient of [place] - PTCA apical LAD 2005  . Borderline diabetes     + proteinuria  . GERD (gastroesophageal reflux disease)     + constipation, gastroparesis  . Hyperlipidemia   . Female stress incontinence     H/o urinary tract infection  . Vertigo   . Insomnia   . Colon cancer     1995  . Multiple myeloma   . Osteoarthritis     cervical spine; status post surgery  . CVA (cerebral infarction) 01/2012  . Tubular adenoma of colon 01/09/12  . Gastroparesis 2009    abnl GES   Medications:  Scheduled:  . amLODipine  5 mg Oral Daily  . aspirin EC  81 mg Oral Daily  . atorvastatin  40 mg Oral q1800  . azithromycin  500 mg Intravenous Q24H  . [START ON 06/06/2013] cefTRIAXone (ROCEPHIN)  IV  1 g Intravenous Q24H  . cholecalciferol  1,000 Units  Oral Daily  . cloNIDine  0.2 mg Oral BID  . docusate sodium  100 mg Oral BID  . enoxaparin (LOVENOX) injection  100 mg Subcutaneous Q24H  . ipratropium-albuterol  3 mL Nebulization Q4H  . nebivolol  5 mg Oral Daily  . oxybutynin  10 mg Oral Daily  . pantoprazole  40 mg Oral Daily  . potassium chloride  10 mEq Oral BID   Assessment: 73yo female admitted with suspected UTI.  Estimated Creatinine Clearance: 25.2 ml/min (by C-G formula based on Cr of 2.38).  No renal adjustment needed.  Goal of Therapy:  Eradicate infection.  Plan:  Rocephin 1gm IV q24hrs Monitor labs and progress  Hart Robinsons A 06-19-2013,1:15 PM

## 2013-06-23 NOTE — Progress Notes (Signed)
Consult received via EPIC order. Currently the palliative medicine team is unable to provide consultation services at [facility]. We look forward to providing this service in the future. If appropriate please refer this patient to Hospice.  Lane Hacker, DO Palliative Medicine

## 2013-06-23 NOTE — Discharge Summary (Signed)
Death Summary  [name] KUV:[id number] DOB: 1940/09/25 DOA: 2013-06-19  PCP: Maricela Curet, MD PCP/Office notified:   Admit date: 2013/06/19 Date of Death: 06/19/13  Final Diagnoses:  Active Problems:   Cardiopulmonary arrest    MULTIPLE  MYELOMA   Anemia, normocytic normochromic   Obesity, Class II, BMI 35-39.9, with comorbidity   Hypertension   Borderline diabetes   GERD (gastroesophageal reflux disease)   Hyperlipidemia   Cerebrovascular disease   Acute respiratory failure    History of present illness:  [name] is a 72 y.o. female  With a history of multiple myeloma, hypertension, history of stroke that presents emergency department for trouble breathing. Daughters at bedside provided most of her history. Patient was oncology several days ago which at that time decided to stop receiving treatment for multiple myeloma. Patient's family members state that over the last several weeks her appetite has decreased. He also states she's been unable to get out of bed without assistance. Patient's daughter denies any current members have been sick. Denies any fever or chills at home. They do state her hyperventilating and her trouble breathing has been ongoing over a several days.   Hospital Course:  This is a 73 year old with multiple medical problems it was admitted today for acute respiratory failure with metabolic acidosis. This was thought to be secondary to possible bronchitis with community acquired pneumonia versus pulmonary embolism, as patient does have several risk factors including multiple myeloma, recent immobility. Patient was admitted to the step down/ICU. Unfortunately CT of the chest could not be conducted due to patient's acute kidney injury. Full dose Lovenox was ordered for the patient to cover for pulmonary embolism. At approximately 1315, nurse was checking on patient's blood pressure when she noted the patient did not have a pulse. CPR was then started at  1315. At 1323 epinephrine was given 1 mg. At 1326 one amp of bicarbonate was then given. Patient was then intubated 1327. Patient lost IV access, at which point several nurses try to obtain IV access and IO could not be obtained. Patient's family members were present during time of this code. He requested that could be stopped. Code was stopped at 1330 and patient was pronounced.  Patient's other medical problems included: Acute kidney injury with history of CAD likely stage III Multiple myeloma, recent discontinuance of chemotherapy Hypertension Hyperlipidemia History of stroke with residual left-sided weakness GERD Borderline diabetes Stress incontinence Generalized weakness   Time: 45 minutes  Signed:  VALLA, PACEY  [facility] 19-Jun-2013, 2:03 PM

## 2013-06-23 NOTE — ED Provider Notes (Signed)
CSN: [id number]     Arrival date & time June 16, 2013  5809 History   First MD Initiated Contact with Patient 06/16/2013 0809     Chief Complaint  Patient presents with  . Hyperventilating     (Consider location/radiation/quality/duration/timing/severity/associated sxs/prior Treatment) The history is provided by the patient.  [name] is a 73 y.o. female who states that she is having trouble breathing. Family members feel like she is breathing fast. She saw her oncologist several days ago and at that time decided to stop receiving treatment for multiple myeloma. She is still considered "full code." She has not been coughing. She has not had a documented fever. She has had decreased appetite for several weeks. She is unable to get up from her bed without assistance. She does not have and of life decisions made, or a MOST form. The family members who are with her, 2 daughters, state that they want her to be treated today. She does not have a power of attorney. There are no other known modifying factors   Past Medical History  Diagnosis Date  . Hypertension   . Arteriosclerotic cardiovascular disease (ASCVD)     Former patient of [place] - PTCA apical LAD 2005  . Borderline diabetes     + proteinuria  . GERD (gastroesophageal reflux disease)     + constipation, gastroparesis  . Hyperlipidemia   . Female stress incontinence     H/o urinary tract infection  . Vertigo   . Insomnia   . Colon cancer     1995  . Multiple myeloma   . Osteoarthritis     cervical spine; status post surgery  . CVA (cerebral infarction) 01/2012  . Tubular adenoma of colon 01/09/12  . Gastroparesis 2009    abnl GES   Past Surgical History  Procedure Laterality Date  . Partial colectomy  1995     [facility], colon cancer  . Abdominal hysterectomy  1970s    [facility]; secondary to metromenorrhagia  . Meniscus repair  2003    Arthroscopic, left,  [facility]  . Heel spur surgery      [facility], [facility]  . Cervical spine surgery  2004     [facility]  . Port-a-cath removal  11/05/2010    Procedure: REMOVAL PORT-A-CATH;  Surgeon: Jamesetta So;  Location: [facility];  Service: General;  Laterality: Left;  started at 0846  . Portacath placement  11/05/2010    Procedure: INSERTION PORT-A-CATH;  Surgeon: Jamesetta So;  Location: [facility];  Service: General;  Laterality: Right;  Right subclavian, device left accessed   . Colonoscopy   07/02/2007    RMR: Normal rectum status post prior sigmoid resection with normal anastomosis identified at 35 cm.  Normal residual colon.Marland Kitchen Next TCS 06/2012  . Esophagogastroduodenoscopy    08/22/2007    XIP:JASNK hiatal hernia/A circular, purplish, 1-cm, distal esophageal submucosal lesion of uncertain significance, otherwise normal esophagus. EUS -esophageal bleb (aberrant blood vessel). retained gastric contents/food  . Appendectomy  1953  . Esophagogastroduodenoscopy  01/09/12    Rourk: small HH, otherwise normal EGD  . Colonoscopy  01/09/12    Rourk: surgical anastamosis @ 25cm.  2 diminutive polyps (tubular adenomas) removed. next TCS 12/2014.   Family History  Problem Relation Age of Onset  . Anesthesia problems Neg Hx   . Hypotension Neg Hx   . Malignant hyperthermia Neg Hx   . Pseudochol deficiency Neg Hx   . Lung disease Mother   . Heart disease Father   .  Heart disease Brother   . Hypertension Brother   . Arthritis Daughter   . Seizures Daughter   . Multiple sclerosis Son   . Colon cancer Brother 9   History  Substance Use Topics  . Smoking status: Former Smoker -- 0.25 packs/day for 15 years    Types: Cigarettes    Quit date: 11/03/1990  . Smokeless tobacco: Never Used  . Alcohol Use: No   OB History   Grav Para Term Preterm Abortions TAB SAB Ect Mult Living                 Review of Systems  All other systems reviewed and are negative.      Allergies  Gabapentin and Simvastatin  Home Medications   Current Outpatient Rx  Name  Route  Sig  Dispense  Refill  .  amLODipine (NORVASC) 5 MG tablet   Oral   Take 1 tablet (5 mg total) by mouth daily.   30 tablet   2   . aspirin EC 81 MG tablet   Oral   Take 81 mg by mouth daily.         . cholecalciferol (VITAMIN D) 1000 UNITS tablet   Oral   Take 1,000 Units by mouth daily.         . cloNIDine (CATAPRES) 0.2 MG tablet   Oral   Take 1 tablet (0.2 mg total) by mouth 2 (two) times daily.   60 tablet   1   . CRESTOR 20 MG tablet   Oral   Take 20 mg by mouth daily.          Marland Kitchen docusate sodium (COLACE) 100 MG capsule   Oral   Take 1 capsule (100 mg total) by mouth 2 (two) times daily.   60 capsule   4   . ipratropium-albuterol (DUONEB) 0.5-2.5 (3) MG/3ML SOLN   Nebulization   Take 3 mLs by nebulization every 4 (four) hours.   360 mL   1   . meloxicam (MOBIC) 7.5 MG tablet   Oral   Take 7.5 mg by mouth daily.          . naproxen sodium (ANAPROX) 220 MG tablet   Oral   Take 220 mg by mouth 2 (two) times daily with a meal.         . nebivolol (BYSTOLIC) 5 MG tablet   Oral   Take 5 mg by mouth daily.          Marland Kitchen omeprazole (PRILOSEC) 20 MG capsule   Oral   Take 20 mg by mouth 2 (two) times daily.          Marland Kitchen oxybutynin (DITROPAN) 5 MG tablet   Oral   Take 10 mg by mouth daily.         . potassium chloride (K-DUR) 10 MEQ tablet   Oral   Take 10 mEq by mouth 2 (two) times daily.         . temazepam (RESTORIL) 15 MG capsule   Oral   Take 1 capsule (15 mg total) by mouth at bedtime as needed for sleep.   30 capsule   1     Please deliver   . valsartan (DIOVAN) 160 MG tablet   Oral   Take 1 tablet (160 mg total) by mouth daily.   30 tablet   3     Please deliver    BP 67/50  Temp(Src) 97.8 F (36.6 C) (Oral)  Resp 38  SpO2 99% Physical Exam  Nursing note and vitals reviewed. Constitutional: She appears well-developed. She appears distressed (she is ill-appearing).  Elderly, obese, appears older than stated age.  HENT:  Head: Normocephalic and  atraumatic.  Eyes: Conjunctivae and EOM are normal. Pupils are equal, round, and reactive to light.  Neck: Normal range of motion and phonation normal. Neck supple.  Cardiovascular:  She is tachycardiac without a murmur  Pulmonary/Chest: Effort normal and breath sounds normal. She exhibits no tenderness.  There are 2 masses of the upper chest wall that are slightly reddened and tender. Lungs reveal wheezing bilaterally. There is somewhat decreased air movement.  Abdominal: Soft. She exhibits no distension. There is no tenderness. There is no guarding.  Musculoskeletal: Normal range of motion. She exhibits no edema and no tenderness.  Neurological: She is alert. She exhibits normal muscle tone.  She is alert, responsive, and follows commands. She speaks softly and is somwhat difficult to understand. There is no evident. There is no dysarthria or aphasia.  Skin: Skin is warm and dry.  Psychiatric: She has a normal mood and affect. Her behavior is normal.    ED Course  Procedures (including critical care time)  I broached the subject of end-of-life care, with the family members. They state that she is a "full code", and they would like treatments done to make her better, and keep her comfortable. They have not yet talked about palliative care doctor.  Medications  0.9 %  sodium chloride infusion ( Intravenous New Bag/Given 2013-06-24 1038)  cefTRIAXone (ROCEPHIN) 1 g in dextrose 5 % 50 mL IVPB (1 g Intravenous New Bag/Given Jun 24, 2013 1037)  sodium chloride 0.9 % bolus 1,000 mL (1,000 mLs Intravenous New Bag/Given 06-24-2013 0900)    Patient Vitals for the past 24 hrs:  BP Temp Temp src Resp SpO2  2013-06-24 0815 - - - 38 -  06-24-2013 0805 67/50 mmHg 97.8 F (36.6 C) Oral - 99 %    10:05 AM Reevaluation. After initial assessment and treatment, an updated evaluation reveals initial evaluation is consistent with UTI, on a catheter urine sample.Daleen Bo L   10:32 AM Reevaluation with update  and discussion. After initial assessment and treatment, an updated evaluation reveals she has not yet had in the IV fluids, which were ordered at 0830. The daughter is concerned about the appearance of the skin of her legs. She has tenting of the skin consistent with dehydration, which I believe, is causing this abnormal appearance. The patient is asking for a tube to be placed in her stomach to help her eat. I discussed the findings with the daughter, and the patient. I attempted to answer their questions. Deni Berti L   10:40- to pressure 132/91 with heart rate 116. She is alert and conversant.    Labs Review Labs Reviewed  CBC WITH DIFFERENTIAL - Abnormal; Notable for the following:    WBC 16.8 (*)    RBC 3.66 (*)    Hemoglobin 11.2 (*)    RDW 21.0 (*)    Neutrophils Relative % 91 (*)    Neutro Abs 15.3 (*)    Lymphocytes Relative 3 (*)    Lymphs Abs 0.5 (*)    All other components within normal limits  BASIC METABOLIC PANEL - Abnormal; Notable for the following:    CO2 9 (*)    Glucose, Bld 228 (*)    BUN 34 (*)    Creatinine, Ser 2.38 (*)    GFR calc non Af Amer 19 (*)  GFR calc Af Amer 22 (*)    All other components within normal limits  URINALYSIS, ROUTINE W REFLEX MICROSCOPIC - Abnormal; Notable for the following:    Specific Gravity, Urine >1.030 (*)    Glucose, UA 250 (*)    Hgb urine dipstick SMALL (*)    Bilirubin Urine MODERATE (*)    Ketones, ur TRACE (*)    Protein, ur 100 (*)    All other components within normal limits  URINE MICROSCOPIC-ADD ON - Abnormal; Notable for the following:    Bacteria, UA MANY (*)    All other components within normal limits  URINE CULTURE   Imaging Review Dg Chest Port 1 View  June 16, 2013   CLINICAL DATA:  Shortness of breath.  EXAM: PORTABLE CHEST - 1 VIEW  COMPARISON:  04/26/2013  FINDINGS: Power port in place, unchanged. Slight chronic cardiomegaly with tortuosity of the thoracic aorta. Pulmonary vascularity is normal. Lungs  are clear except for peribronchial thickening. No acute osseous abnormality.  IMPRESSION: Peribronchial thickening suggestive of bronchitis.   Electronically Signed   By: Rozetta Nunnery M.D.   On: 16-Jun-2013 08:39    EKG Interpretation   None       MDM   Final diagnoses:  Urinary tract infection  Dehydration  Acute kidney injury  Multiple myeloma  Acidosis  Tachypnea    Malaise, dehydration and and possible urinary tract infection, complicated by renal insufficiency, and acidosis. I suspect that she has moderate to severe dehydration. She has apparent end-stage multiple myeloma that is not being actively treated. She is tachypneic, likely secondary to dehydration. There is no evidence for pneumonia, heart failure or metastatic disease in the lungs. She is at high risk for pulmonary embolus, and likely needs anticoagulation, followed by imaging, to evaluate for the presence of pulmonary embolus. She'll need to be admitted for stabilization. She also needs to be seen by palliative care, to establish her treatment course, in light of the mild treatment for multiple myeloma. At the time of admission she is a full code. The patient and family seem to have somewhat unrealistic expectations for her improvement, in light of her severe illness, and debilitated state.  Nursing Notes Reviewed/ Care Coordinated, and agree without changes. Applicable Imaging Reviewed.  Interpretation of Laboratory Data incorporated into ED treatment  Plan: Admit   Richarda Blade, MD 06-16-13 1458

## 2013-06-23 NOTE — Progress Notes (Signed)
ANTICOAGULATION CONSULT NOTE - Initial Consult  Pharmacy Consult for Lovenox Indication: pulmonary embolus  Allergies  Allergen Reactions  . Gabapentin Other (See Comments)    Difficulty with speech  . Simvastatin Other (See Comments)    Leg Cramps     Patient Measurements:   Heparin Dosing Weight: 104Kg (last recorded 04/26/13)  Vital Signs: Temp: 97.8 F (36.6 C) (02/11 0805) Temp src: Oral (02/11 0805) BP: 113/92 mmHg (02/11 1033) Pulse Rate: 112 (02/11 1227)  Labs:  Recent Labs  06-12-13 0945  HGB 11.2*  HCT 36.1  PLT 206  CREATININE 2.38*    The CrCl is unknown because both a height and weight (above a minimum accepted value) are required for this calculation.   Medical History: Past Medical History  Diagnosis Date  . Hypertension   . Arteriosclerotic cardiovascular disease (ASCVD)     Former patient of [place] - PTCA apical LAD 2005  . Borderline diabetes     + proteinuria  . GERD (gastroesophageal reflux disease)     + constipation, gastroparesis  . Hyperlipidemia   . Female stress incontinence     H/o urinary tract infection  . Vertigo   . Insomnia   . Colon cancer     1995  . Multiple myeloma   . Osteoarthritis     cervical spine; status post surgery  . CVA (cerebral infarction) 01/2012  . Tubular adenoma of colon 01/09/12  . Gastroparesis 2009    abnl GES   Assessment: 73yo female in ED with possible PE.  Will order 1st dose of Lovenox 8m/Kg.    Plan:  Lovenox 1062mSQ now F/U plan for further dosing  HaHart Robinsons 2/Feb 18, 20152:30 PM

## 2013-06-23 NOTE — Progress Notes (Signed)
ANTICOAGULATION CONSULT NOTE - Initial Consult  Pharmacy Consult for Lovenox Indication: pulmonary embolus  Allergies  Allergen Reactions  . Gabapentin Other (See Comments)    Difficulty with speech  . Simvastatin Other (See Comments)    Leg Cramps    Patient Measurements: Height: 5' 5"  (165.1 cm) Weight: 217 lb 9.5 oz (98.7 kg) IBW/kg (Calculated) : 57  Vital Signs: Temp: 97.8 F (36.6 C) (02/11 0805) Temp src: Oral (02/11 0805) BP: 113/92 mmHg (02/11 1033) Pulse Rate: 116 (02/11 1256)  Labs:  Recent Labs  28-Jun-2013 0945  HGB 11.2*  HCT 36.1  PLT 206  CREATININE 2.38*   Estimated Creatinine Clearance: 25.2 ml/min (by C-G formula based on Cr of 2.38).  Medical History: Past Medical History  Diagnosis Date  . Hypertension   . Arteriosclerotic cardiovascular disease (ASCVD)     Former patient of [place] - PTCA apical LAD 2005  . Borderline diabetes     + proteinuria  . GERD (gastroesophageal reflux disease)     + constipation, gastroparesis  . Hyperlipidemia   . Female stress incontinence     H/o urinary tract infection  . Vertigo   . Insomnia   . Colon cancer     1995  . Multiple myeloma   . Osteoarthritis     cervical spine; status post surgery  . CVA (cerebral infarction) 01/2012  . Tubular adenoma of colon 01/09/12  . Gastroparesis 2009    abnl GES   Assessment: 73yo female in ED with possible PE.  SCr elevated.  Estimated Creatinine Clearance: 25.2 ml/min (by C-G formula based on Cr of 2.38).   Monitor platelets per anticoagulation protocol:  Yes  Plan:  Lovenox 115m SQ q24hrs (renally adjusted) Monitor CBC  [place] Crane Deva Ron A [phone]:[id number] PM

## 2013-06-23 NOTE — Progress Notes (Signed)
Pt is awake with no verbal response. No pulse detected. Code Blue initiated. CPR was started at 1315. 1mg  of Epinephrine given. 1326 1 amp of Bicarb given. 1327 pt was intubated by ED physician and at 5 family requested for staff to stop the code.

## 2013-06-23 DEATH — deceased

## 2013-07-10 ENCOUNTER — Telehealth (HOSPITAL_COMMUNITY): Payer: Self-pay | Admitting: Oncology

## 2013-12-20 ENCOUNTER — Other Ambulatory Visit: Payer: Self-pay | Admitting: *Deleted
